# Patient Record
Sex: Female | Born: 1955 | Race: White | Hispanic: Yes | Marital: Married | Smoking: Never smoker
Health system: Southern US, Community
[De-identification: ages and names within clinical notes are randomized; demographics above are authoritative.]

## PROBLEM LIST (undated history)

## (undated) DIAGNOSIS — K219 Gastro-esophageal reflux disease without esophagitis: Secondary | ICD-10-CM

## (undated) DIAGNOSIS — E7800 Pure hypercholesterolemia, unspecified: Secondary | ICD-10-CM

## (undated) DIAGNOSIS — A159 Respiratory tuberculosis unspecified: Secondary | ICD-10-CM

## (undated) DIAGNOSIS — I255 Ischemic cardiomyopathy: Secondary | ICD-10-CM

## (undated) DIAGNOSIS — N390 Urinary tract infection, site not specified: Secondary | ICD-10-CM

## (undated) DIAGNOSIS — I5042 Chronic combined systolic (congestive) and diastolic (congestive) heart failure: Secondary | ICD-10-CM

## (undated) DIAGNOSIS — Z8489 Family history of other specified conditions: Secondary | ICD-10-CM

## (undated) DIAGNOSIS — I5189 Other ill-defined heart diseases: Secondary | ICD-10-CM

## (undated) DIAGNOSIS — I219 Acute myocardial infarction, unspecified: Secondary | ICD-10-CM

## (undated) DIAGNOSIS — J45909 Unspecified asthma, uncomplicated: Secondary | ICD-10-CM

## (undated) DIAGNOSIS — J302 Other seasonal allergic rhinitis: Secondary | ICD-10-CM

## (undated) DIAGNOSIS — F419 Anxiety disorder, unspecified: Secondary | ICD-10-CM

## (undated) DIAGNOSIS — E119 Type 2 diabetes mellitus without complications: Secondary | ICD-10-CM

## (undated) DIAGNOSIS — I2510 Atherosclerotic heart disease of native coronary artery without angina pectoris: Secondary | ICD-10-CM

## (undated) HISTORY — DX: Ischemic cardiomyopathy: I25.5

## (undated) HISTORY — PX: CARDIAC CATHETERIZATION: SHX172

## (undated) HISTORY — DX: Chronic combined systolic (congestive) and diastolic (congestive) heart failure: I50.42

## (undated) HISTORY — PX: GANGLION CYST EXCISION: SHX1691

## (undated) HISTORY — PX: TOOTH EXTRACTION: SUR596

## (undated) HISTORY — DX: Urinary tract infection, site not specified: N39.0

## (undated) HISTORY — DX: Atherosclerotic heart disease of native coronary artery without angina pectoris: I25.10

## (undated) HISTORY — DX: Unspecified asthma, uncomplicated: J45.909

---

## 2004-10-09 HISTORY — PX: TOTAL ABDOMINAL HYSTERECTOMY: SHX209

## 2005-06-29 ENCOUNTER — Emergency Department: Payer: Self-pay | Admitting: Emergency Medicine

## 2005-07-04 ENCOUNTER — Ambulatory Visit: Payer: Self-pay | Admitting: Emergency Medicine

## 2006-03-19 ENCOUNTER — Ambulatory Visit: Payer: Self-pay | Admitting: Internal Medicine

## 2006-03-30 ENCOUNTER — Ambulatory Visit: Payer: Self-pay | Admitting: Obstetrics and Gynecology

## 2007-11-20 ENCOUNTER — Emergency Department: Payer: Self-pay | Admitting: Internal Medicine

## 2008-11-16 ENCOUNTER — Ambulatory Visit: Payer: Self-pay | Admitting: Unknown Physician Specialty

## 2012-06-09 LAB — HM DIABETES EYE EXAM

## 2013-06-16 ENCOUNTER — Encounter: Payer: Self-pay | Admitting: Adult Health

## 2013-06-16 ENCOUNTER — Ambulatory Visit (INDEPENDENT_AMBULATORY_CARE_PROVIDER_SITE_OTHER): Payer: Managed Care, Other (non HMO) | Admitting: Adult Health

## 2013-06-16 VITALS — BP 160/78 | HR 84 | Temp 98.2°F | Resp 12 | Ht 61.75 in | Wt 175.0 lb

## 2013-06-16 DIAGNOSIS — Z0000 Encounter for general adult medical examination without abnormal findings: Secondary | ICD-10-CM

## 2013-06-16 DIAGNOSIS — Z1211 Encounter for screening for malignant neoplasm of colon: Secondary | ICD-10-CM | POA: Insufficient documentation

## 2013-06-16 DIAGNOSIS — E1169 Type 2 diabetes mellitus with other specified complication: Secondary | ICD-10-CM | POA: Insufficient documentation

## 2013-06-16 DIAGNOSIS — E119 Type 2 diabetes mellitus without complications: Secondary | ICD-10-CM

## 2013-06-16 DIAGNOSIS — Z1239 Encounter for other screening for malignant neoplasm of breast: Secondary | ICD-10-CM | POA: Insufficient documentation

## 2013-06-16 DIAGNOSIS — E1159 Type 2 diabetes mellitus with other circulatory complications: Secondary | ICD-10-CM | POA: Insufficient documentation

## 2013-06-16 LAB — HM DIABETES FOOT EXAM: HM Diabetic Foot Exam: NORMAL

## 2013-06-16 MED ORDER — METFORMIN HCL 1000 MG PO TABS: 1000.0000 mg | ORAL_TABLET | Freq: Every day | ORAL | Status: DC

## 2013-06-16 NOTE — Assessment & Plan Note (Signed)
Normal physical exam. check labs: CBC with differential, TSH, basic metabolic panel, hepatic panel, vitamin D level, B12 and hemoglobin A1c

## 2013-06-16 NOTE — Patient Instructions (Addendum)
   Thank you for choosing [place] at [facility] for your health care needs.  Please return for your fasting blood work. Nothing to eat or drink after midnight. You may drink water.  The results will be available through MyChart for your convenience. Please remember to activate this. The activation code is located at the end of this form.

## 2013-06-16 NOTE — Assessment & Plan Note (Signed)
Currently on metformin 1000 mg daily. Patient reports that she ran out of medication approximately one month ago. Medication reordered. Check hemoglobin A1c.

## 2013-06-16 NOTE — Assessment & Plan Note (Signed)
Patient has not had screening colonoscopy. Refer to GI for same

## 2013-06-16 NOTE — Progress Notes (Signed)
  Subjective:    Patient ID: Olivia Roth, female    DOB: September 03, 1956, 57 y.o.   MRN: [id number]  HPI  Olivia Roth presents to clinic to establish care. She was previously followed for her DM by Dr. Silver Huguenin who has since retired. Otherwise she would go to Urgent Care in the event of acute illness.  She is feeling good today. No current concerns.    Past Medical History  Diagnosis Date  . Asthma     In cold weather  . Diabetes mellitus without complication   . Allergy     Seasonal allergies  . UTI (lower urinary tract infection)      Past Surgical History  Procedure Laterality Date  . Salpingooophrectomy Bilateral 2006  . Abdominal hysterectomy  2006     History reviewed. No pertinent family history.   History   Social History  . Marital Status: Married    Spouse Name: N/A    Number of Children: 2  . Years of Education: 18   Occupational History  . Principal     [employer]   Social History Main Topics  . Smoking status: Never Smoker   . Smokeless tobacco: Never Used  . Alcohol Use: Yes     Comment: occasional use  . Drug Use: No  . Sexual Activity: Yes    Birth Control/ Protection: Surgical   Other Topics Concern  . Not on file   Social History Narrative  . No narrative on file     Review of Systems  Constitutional: Negative.   HENT: Negative.   Eyes: Negative.   Respiratory: Negative.   Cardiovascular: Negative.   Gastrointestinal: Negative.   Endocrine: Negative.   Genitourinary: Negative.   Musculoskeletal: Negative.        Occasional joint discomfort attributed to normal wear and tear.  Skin: Negative.   Allergic/Immunologic:       Seasonal allergies - mild  Neurological: Negative.   Hematological: Negative.   Psychiatric/Behavioral: Negative.    BP 160/78  Pulse 84  Temp(Src) 98.2 F (36.8 C) (Oral)  Resp 12  Ht 5' 1.75" (1.568 m)  Wt 175 lb (79.379 kg)  BMI 32.29 kg/m2  SpO2 97%    Objective:   Physical Exam   Constitutional: She is oriented to person, place, and time. She appears well-developed and well-nourished.  HENT:  Head: Normocephalic and atraumatic.  Right Ear: External ear normal.  Left Ear: External ear normal.  Nose: Nose normal.  Mouth/Throat: Oropharynx is clear and moist.  Eyes: Conjunctivae and EOM are normal. Pupils are equal, round, and reactive to light.  Neck: Normal range of motion. Neck supple.  Cardiovascular: Normal rate, regular rhythm, normal heart sounds and intact distal pulses.  Exam reveals no gallop and no friction rub.   No murmur heard. Pulmonary/Chest: Effort normal and breath sounds normal. No respiratory distress. She has no wheezes. She has no rales. She exhibits no tenderness.  Abdominal: Soft. Bowel sounds are normal. She exhibits no distension and no mass. There is no tenderness. There is no rebound and no guarding.  Musculoskeletal: Normal range of motion. She exhibits no edema and no tenderness.  Neurological: She is alert and oriented to person, place, and time.  Skin: Skin is warm and dry.  Psychiatric: She has a normal mood and affect. Her behavior is normal. Judgment and thought content normal.      Assessment & Plan:

## 2013-06-16 NOTE — Assessment & Plan Note (Signed)
Mammogram ordered

## 2013-06-23 ENCOUNTER — Other Ambulatory Visit: Payer: Managed Care, Other (non HMO)

## 2015-10-06 ENCOUNTER — Ambulatory Visit (INDEPENDENT_AMBULATORY_CARE_PROVIDER_SITE_OTHER): Payer: 59 | Admitting: Family Medicine

## 2015-10-06 ENCOUNTER — Encounter: Payer: Self-pay | Admitting: Family Medicine

## 2015-10-06 VITALS — BP 144/92 | HR 112 | Temp 99.7°F | Resp 16 | Ht 61.0 in | Wt 162.0 lb

## 2015-10-06 DIAGNOSIS — R059 Cough, unspecified: Secondary | ICD-10-CM

## 2015-10-06 DIAGNOSIS — H66001 Acute suppurative otitis media without spontaneous rupture of ear drum, right ear: Secondary | ICD-10-CM

## 2015-10-06 DIAGNOSIS — R030 Elevated blood-pressure reading, without diagnosis of hypertension: Secondary | ICD-10-CM

## 2015-10-06 DIAGNOSIS — R05 Cough: Secondary | ICD-10-CM | POA: Diagnosis not present

## 2015-10-06 DIAGNOSIS — Z20828 Contact with and (suspected) exposure to other viral communicable diseases: Secondary | ICD-10-CM | POA: Diagnosis not present

## 2015-10-06 DIAGNOSIS — R509 Fever, unspecified: Secondary | ICD-10-CM

## 2015-10-06 DIAGNOSIS — E119 Type 2 diabetes mellitus without complications: Secondary | ICD-10-CM | POA: Diagnosis not present

## 2015-10-06 DIAGNOSIS — IMO0001 Reserved for inherently not codable concepts without codable children: Secondary | ICD-10-CM

## 2015-10-06 LAB — POCT INFLUENZA A/B
Influenza A, POC: NEGATIVE
Influenza B, POC: NEGATIVE

## 2015-10-06 MED ORDER — DM-GUAIFENESIN ER 30-600 MG PO TB12: 1.0000 | ORAL_TABLET | Freq: Two times a day (BID) | ORAL | Status: DC

## 2015-10-06 MED ORDER — OSELTAMIVIR PHOSPHATE 75 MG PO CAPS: 75.0000 mg | ORAL_CAPSULE | Freq: Two times a day (BID) | ORAL | Status: DC

## 2015-10-06 MED ORDER — BENZONATATE 100 MG PO CAPS: 100.0000 mg | ORAL_CAPSULE | Freq: Three times a day (TID) | ORAL | Status: DC | PRN

## 2015-10-06 MED ORDER — OSELTAMIVIR PHOSPHATE 75 MG PO CAPS: 75.0000 mg | ORAL_CAPSULE | Freq: Two times a day (BID) | ORAL | Status: AC

## 2015-10-06 MED ORDER — AMOXICILLIN 500 MG PO CAPS: 500.0000 mg | ORAL_CAPSULE | Freq: Two times a day (BID) | ORAL | Status: DC

## 2015-10-06 NOTE — Progress Notes (Signed)
Subjective:    Patient ID: Olivia Roth, female    DOB: 08/03/1956, 59 y.o.   MRN: [id number]  HPI: Olivia Roth is a 59 y.o. female presenting on 10/06/2015 for Cough   HPI  Pt presents as new patient for a sick visit. She will follow-up to establish care at a later date. Records from previous provider will be requested and reviewed.   Symptoms began yesterday with cough and fever.  Took tylenol for fever. Non-productive cough. Chest tightness. No wheezing. No nasal congestion. Runny nose- clear nasal drainage. Body aches. Husband is positive for the flu. Denies Chest pain or shortness of breath.   History is significant for diabetes. Pt reports she is not taking metformin at this time.   Past Medical History  Diagnosis Date  . Asthma     In cold weather  . Diabetes mellitus without complication   . Allergy     Seasonal allergies  . UTI (lower urinary tract infection)    Social History   Social History  . Marital Status: Married    Spouse Name: N/A  . Number of Children: 2  . Years of Education: 18   Occupational History  . Principal     [employer]   Social History Main Topics  . Smoking status: Never Smoker   . Smokeless tobacco: Never Used  . Alcohol Use: Yes     Comment: occasional use  . Drug Use: No  . Sexual Activity: Yes    Birth Control/ Protection: Surgical   Other Topics Concern  . Not on file   Social History Narrative  . No narrative on file   No family history on file. No current outpatient prescriptions on file prior to visit.   No current facility-administered medications on file prior to visit.    Review of Systems  Constitutional: Positive for fever and chills.  HENT: Positive for rhinorrhea and sore throat. Negative for ear pain.   Eyes: Negative for visual disturbance.  Respiratory: Positive for cough and chest tightness. Negative for shortness of breath and wheezing.   Cardiovascular: Negative for chest pain, palpitations  and leg swelling.  Gastrointestinal: Negative for nausea, vomiting, abdominal pain, diarrhea and constipation.  Endocrine: Negative.  Negative for cold intolerance, heat intolerance, polydipsia, polyphagia and polyuria.  Genitourinary: Negative for dysuria and difficulty urinating.  Musculoskeletal: Positive for myalgias. Negative for neck pain and neck stiffness.  Neurological: Positive for headaches. Negative for dizziness, light-headedness and numbness.  Psychiatric/Behavioral: Negative.    Per HPI unless specifically indicated above     Objective:    BP 144/92 mmHg  Pulse 112  Temp(Src) 99.7 F (37.6 C) (Oral)  Resp 16  Ht  (1.549 m)  Wt 162 lb (73.483 kg)  BMI 30.63 kg/m2  SpO2 94%  Wt Readings from Last 3 Encounters:  10/06/15 162 lb (73.483 kg)  06/16/13 175 lb (79.379 kg)    Physical Exam  Constitutional: She is oriented to person, place, and time. She appears well-developed and well-nourished.  HENT:  Head: Normocephalic and atraumatic.  Right Ear: Hearing normal. Tympanic membrane is erythematous and bulging.  Left Ear: Hearing normal. Tympanic membrane is not erythematous and not bulging.  Nose: Mucosal edema and rhinorrhea present. Right sinus exhibits no maxillary sinus tenderness and no frontal sinus tenderness. Left sinus exhibits no maxillary sinus tenderness and no frontal sinus tenderness.  Mouth/Throat: Uvula is midline and mucous membranes are normal. Posterior oropharyngeal erythema present.  Neck: Neck supple.  No Brudzinski's sign and no Kernig's sign noted.  Cardiovascular: Regular rhythm, normal heart sounds and normal pulses.  Tachycardia present.  Exam reveals no gallop and no friction rub.   No murmur heard. Pulmonary/Chest: Effort normal and breath sounds normal. No respiratory distress. She has no decreased breath sounds. She has no wheezes. She has no rhonchi. She has no rales. Chest wall is not dull to percussion. She exhibits no tenderness.    Abdominal: Soft. Normal appearance and bowel sounds are normal. She exhibits no distension and no mass. There is no tenderness. There is no rebound and no guarding.  Musculoskeletal: Normal range of motion. She exhibits no edema or tenderness.  Lymphadenopathy:    She has no cervical adenopathy.  Neurological: She is alert and oriented to person, place, and time.  Skin: Skin is warm and dry.   Results for orders placed or performed in visit on 10/06/15  POCT Influenza A/B  Result Value Ref Range   Influenza A, POC Negative Negative   Influenza B, POC Negative Negative      Assessment & Plan:   Problem List Items Addressed This Visit      Endocrine   Diabetes mellitus type 2, controlled, without complications (HCC)    Pt encouraged to monitor blood sugars closely while ill. Blood glucose may be increased during her illness.  Diabetes follow-up in 2 weeks.        Other Visit Diagnoses    Exposure to the flu    -  Primary    Husband positive and her symptoms are the same. Treat with tamiflu. Supportive care reviewed. Alarm symptoms reviewed. Return if not improving.     Relevant Medications    oseltamivir (TAMIFLU) 75 MG capsule    Other Relevant Orders    POCT Influenza A/B (Completed)    Fever, unspecified fever cause        Likely 2/2 viral illness, possibily the flu.     Relevant Medications    oseltamivir (TAMIFLU) 75 MG capsule    Other Relevant Orders    POCT Influenza A/B (Completed)    Acute suppurative otitis media of right ear without spontaneous rupture of tympanic membrane, recurrence not specified        Treat with amoxicillin BID for 7 days to clear ear infection.     Relevant Medications    amoxicillin (AMOXIL) 500 MG capsule    oseltamivir (TAMIFLU) 75 MG capsule    Cough        Likely 2/2 the flu. Supportive care at home. Tessalon perles and mucinex DM.     Relevant Medications    benzonatate (TESSALON) 100 MG capsule    dextromethorphan-guaiFENesin  (MUCINEX DM) 30-600 MG 12hr tablet    Elevated BP        With tachycardia. Likley due to illness. Pt will monitor closely at home. Alarm symptoms reviewed. Recheck 2 weeks.        Meds ordered this encounter  Medications  . DISCONTD: benzonatate (TESSALON) 100 MG capsule    Sig: Take 1 capsule (100 mg total) by mouth 3 (three) times daily as needed for cough.    Dispense:  30 capsule    Refill:  0    Order Specific Question:  Supervising Provider    Answer:  Janeann Forehand [[id number]]  . DISCONTD: dextromethorphan-guaiFENesin (MUCINEX DM) 30-600 MG 12hr tablet    Sig: Take 1 tablet by mouth 2 (two) times daily.    Dispense:  20  tablet    Refill:  0    Order Specific Question:  Supervising Provider    Answer:  Janeann ForehandHAWKINS JR, JAMES H [[id number]]  . DISCONTD: oseltamivir (TAMIFLU) 75 MG capsule    Sig: Take 1 capsule (75 mg total) by mouth 2 (two) times daily.    Dispense:  10 capsule    Refill:  0    Order Specific Question:  Supervising Provider    Answer:  Janeann ForehandHAWKINS JR, JAMES H [phone][[id number]]  . DISCONTD: amoxicillin (AMOXIL) 500 MG capsule    Sig: Take 1 capsule (500 mg total) by mouth 2 (two) times daily.    Dispense:  14 capsule    Refill:  0    Order Specific Question:  Supervising Provider    Answer:  Janeann ForehandHAWKINS JR, JAMES H [[id number]]  . benzonatate (TESSALON) 100 MG capsule    Sig: Take 1 capsule (100 mg total) by mouth 3 (three) times daily as needed for cough.    Dispense:  30 capsule    Refill:  0    Order Specific Question:  Supervising Provider    Answer:  Janeann ForehandHAWKINS JR, JAMES H [[id number]]  . amoxicillin (AMOXIL) 500 MG capsule    Sig: Take 1 capsule (500 mg total) by mouth 2 (two) times daily.    Dispense:  14 capsule    Refill:  0    Order Specific Question:  Supervising Provider    Answer:  Janeann ForehandHAWKINS JR, JAMES H [[id number]]  . oseltamivir (TAMIFLU) 75 MG capsule    Sig: Take 1 capsule (75 mg total) by mouth 2 (two) times daily.    Dispense:  10 capsule    Refill:  0    Order  Specific Question:  Supervising Provider    Answer:  Janeann ForehandHAWKINS JR, JAMES H [phone][[id number]]  . dextromethorphan-guaiFENesin (MUCINEX DM) 30-600 MG 12hr tablet    Sig: Take 1 tablet by mouth 2 (two) times daily.    Dispense:  20 tablet    Refill:  0    Order Specific Question:  Supervising Provider    Answer:  Janeann ForehandHAWKINS JR, JAMES H [[id number]]      Follow up plan: Return in about 2 weeks (around 10/20/2015) for BP recheck..Marland Kitchen

## 2015-10-06 NOTE — Assessment & Plan Note (Signed)
Pt encouraged to monitor blood sugars closely while ill. Blood glucose may be increased during her illness.  Diabetes follow-up in 2 weeks.

## 2015-10-06 NOTE — Patient Instructions (Signed)

## 2016-07-05 ENCOUNTER — Encounter: Payer: Self-pay | Admitting: Family Medicine

## 2016-07-05 ENCOUNTER — Ambulatory Visit (INDEPENDENT_AMBULATORY_CARE_PROVIDER_SITE_OTHER): Payer: 59 | Admitting: Family Medicine

## 2016-07-05 VITALS — BP 136/72 | HR 98 | Temp 98.6°F | Resp 16 | Ht 61.0 in | Wt 164.0 lb

## 2016-07-05 DIAGNOSIS — R000 Tachycardia, unspecified: Secondary | ICD-10-CM

## 2016-07-05 DIAGNOSIS — R05 Cough: Secondary | ICD-10-CM

## 2016-07-05 DIAGNOSIS — J40 Bronchitis, not specified as acute or chronic: Secondary | ICD-10-CM | POA: Diagnosis not present

## 2016-07-05 DIAGNOSIS — E119 Type 2 diabetes mellitus without complications: Secondary | ICD-10-CM | POA: Diagnosis not present

## 2016-07-05 DIAGNOSIS — R059 Cough, unspecified: Secondary | ICD-10-CM

## 2016-07-05 DIAGNOSIS — J45909 Unspecified asthma, uncomplicated: Secondary | ICD-10-CM | POA: Insufficient documentation

## 2016-07-05 DIAGNOSIS — J4521 Mild intermittent asthma with (acute) exacerbation: Secondary | ICD-10-CM | POA: Diagnosis not present

## 2016-07-05 LAB — COMPLETE METABOLIC PANEL WITH GFR
ALT: 13 U/L (ref 6–29)
AST: 14 U/L (ref 10–35)
Albumin: 4.1 g/dL (ref 3.6–5.1)
Alkaline Phosphatase: 118 U/L (ref 33–130)
BUN: 13 mg/dL (ref 7–25)
CO2: 24 mmol/L (ref 20–31)
Calcium: 9.2 mg/dL (ref 8.6–10.4)
Chloride: 99 mmol/L (ref 98–110)
Creat: 0.81 mg/dL (ref 0.50–0.99)
GFR, Est African American: >89 mL/min (ref >=60)
GFR, Est Non African American: 79 mL/min (ref >=60)
Glucose, Bld: 361 mg/dL — ABNORMAL HIGH (ref 65–99)
Potassium: 3.9 mmol/L (ref 3.5–5.3)
Sodium: 137 mmol/L (ref 135–146)
Total Bilirubin: 0.9 mg/dL (ref 0.2–1.2)
Total Protein: 7 g/dL (ref 6.1–8.1)

## 2016-07-05 LAB — CBC WITH DIFFERENTIAL/PLATELET
Basophils Absolute: 0 cells/uL (ref 0–200)
Basophils Relative: 0 %
Eosinophils Absolute: 154 cells/uL (ref 15–500)
Eosinophils Relative: 2 %
HCT: 44.2 % (ref 35.0–45.0)
Hemoglobin: 15 g/dL (ref 11.7–15.5)
Lymphocytes Relative: 19 %
Lymphs Abs: 1463 cells/uL (ref 850–3900)
MCH: 30.6 pg (ref 27.0–33.0)
MCHC: 33.9 g/dL (ref 32.0–36.0)
MCV: 90.2 fL (ref 80.0–100.0)
MPV: 11 fL (ref 7.5–12.5)
Monocytes Absolute: 385 cells/uL (ref 200–950)
Monocytes Relative: 5 %
Neutro Abs: 5698 cells/uL (ref 1500–7800)
Neutrophils Relative %: 74 %
Platelets: 187 10*3/uL (ref 140–400)
RBC: 4.9 MIL/uL (ref 3.80–5.10)
RDW: 13.4 % (ref 11.0–15.0)
WBC: 7.7 10*3/uL (ref 3.8–10.8)

## 2016-07-05 LAB — LIPID PANEL
Cholesterol: 237 mg/dL — ABNORMAL HIGH (ref 125–200)
HDL: 34 mg/dL — ABNORMAL LOW (ref >=46)
Total CHOL/HDL Ratio: 7 Ratio — ABNORMAL HIGH (ref <=5.0)
Triglycerides: 405 mg/dL — ABNORMAL HIGH (ref <150)

## 2016-07-05 MED ORDER — BENZONATATE 100 MG PO CAPS: 100.0000 mg | ORAL_CAPSULE | Freq: Three times a day (TID) | ORAL | 0 refills | Status: DC | PRN

## 2016-07-05 MED ORDER — ALBUTEROL SULFATE HFA 108 (90 BASE) MCG/ACT IN AERS: 2.0000 | INHALATION_SPRAY | Freq: Four times a day (QID) | RESPIRATORY_TRACT | 11 refills | Status: DC | PRN

## 2016-07-05 MED ORDER — AZITHROMYCIN 250 MG PO TABS: ORAL_TABLET | ORAL | 0 refills | Status: DC

## 2016-07-05 MED ORDER — PREDNISONE 20 MG PO TABS: 40.0000 mg | ORAL_TABLET | Freq: Every day | ORAL | 0 refills | Status: DC

## 2016-07-05 MED ORDER — ALBUTEROL SULFATE (2.5 MG/3ML) 0.083% IN NEBU
2.5000 mg | INHALATION_SOLUTION | Freq: Once | RESPIRATORY_TRACT | Status: AC
  Administered 2016-07-05: 2.5 mg via RESPIRATORY_TRACT

## 2016-07-05 NOTE — Assessment & Plan Note (Signed)
Check A1c today. Pt encouraged to make follow-up for diabetes at her visit today. Has not been addressed in some time.

## 2016-07-05 NOTE — Assessment & Plan Note (Signed)
Mild HR 102-104 despite albuterol. Pt encouraged to monitor her HR at home if HR >120 or symptoms seek medical attention. Consider metoprolol if HR remains elevated.

## 2016-07-05 NOTE — Patient Instructions (Addendum)
We are treating you for an asthma exacerbation today. Take prednisone 40mg  (2 tablets) once daily with breakfast for 5 days. This will help your breathing. Use albulterol inhaler as needed for shortness of breath or chest tightness.  We will also cover for a lung infection with a Zpak- take 2 pills today and 1 pill daily until the bottle is empty.   Please seek immediate medical attention in the ER if you develop shortness of breath not relieve by inhaler, chest pain/tightness, fever > 103 F or other concerning symptoms.   Please keep an eye on your heart rate with the albuterol inhaler. It did well in the office with our trial of albuterol. If your heart rate >120 or you have palpitations- please seek medical attention.

## 2016-07-05 NOTE — Assessment & Plan Note (Signed)
Treat for asthma exacerbation today. Discussed possible CXR to r/o pneumonia. Pt declined. Prednisone is tolerated despite cortisone allergy. Treat with steroid burst. Albuterol PRN. Supportive care at home. Alarm symptoms reviewed.

## 2016-07-05 NOTE — Progress Notes (Signed)
Subjective:    Patient ID: Olivia MoselleMaria D Mccauslin, female    DOB: Jan 03, 1956, 60 y.o.   MRN: [id number]  HPI: Olivia Roth is a 60 y.o. female presenting on 07/05/2016 for Sinusitis (itchy throat last sinus drainage onset last week) and Cough (onset yesterday coughing hard feeling SOB )   HPI  Pt presents for cough and congestion. Symptoms started 1 weeks ago. Felt better and then it began to worsen yester. She does have asthma- no inhaler at home because it expired. She did not request a refill. Feels like her asthma is exacerbated. Coughing and chest tightness. More shortness of breath. Cough is not productive of sputum.   Past Medical History:  Diagnosis Date  . Allergy    Seasonal allergies  . Asthma    In cold weather  . Diabetes mellitus without complication (HCC)   . UTI (lower urinary tract infection)     No current outpatient prescriptions on file prior to visit.   No current facility-administered medications on file prior to visit.     Review of Systems  Constitutional: Negative for fever.  HENT: Positive for congestion. Negative for rhinorrhea and sore throat.   Respiratory: Positive for cough, chest tightness and wheezing.   Cardiovascular: Negative for chest pain, palpitations and leg swelling.  Gastrointestinal: Negative for diarrhea, nausea and vomiting.  Musculoskeletal: Negative for neck pain and neck stiffness.  Skin: Negative for color change and rash.  Neurological: Negative for headaches.   Per HPI unless specifically indicated above     Objective:    BP 136/72 (BP Location: Left Arm)   Pulse 98 Comment: while administering albuterol  Temp 98.6 F (37 C) (Oral)   Resp 16   Ht 5\' 1"  (1.549 m)   Wt 164 lb (74.4 kg)   SpO2 96%   BMI 30.99 kg/m   Wt Readings from Last 3 Encounters:  07/05/16 164 lb (74.4 kg)  10/06/15 162 lb (73.5 kg)  06/16/13 175 lb (79.4 kg)    Physical Exam  Constitutional: She is oriented to person, place, and time. She appears  well-developed and well-nourished. She appears ill. No distress.  HENT:  Head: Normocephalic and atraumatic.  Right Ear: Hearing and tympanic membrane normal.  Left Ear: Hearing and tympanic membrane normal.  Nose: Mucosal edema and rhinorrhea present. Right sinus exhibits no maxillary sinus tenderness and no frontal sinus tenderness. Left sinus exhibits no maxillary sinus tenderness and no frontal sinus tenderness.  Mouth/Throat: Uvula is midline and mucous membranes are normal. No posterior oropharyngeal erythema.  Neck: Normal range of motion. No erythema present. No Brudzinski's sign and no Kernig's sign noted.  Cardiovascular: Normal rate and regular rhythm.  Exam reveals no gallop and no friction rub.   No murmur heard. Pulmonary/Chest: Effort normal. No respiratory distress. She has no decreased breath sounds. She has wheezes in the right middle field, the right lower field, the left middle field and the left lower field. She has no rhonchi. She has no rales. Chest wall is not dull to percussion. She exhibits no tenderness.  Lymphadenopathy:    She has cervical adenopathy.       Right cervical: Superficial cervical adenopathy present.       Left cervical: Superficial cervical adenopathy present.  Neurological: She is alert and oriented to person, place, and time.  Skin: Skin is warm and dry. No rash noted. She is not diaphoretic. No erythema. No pallor.  Psychiatric: She has a normal mood and affect. Her  speech is normal and behavior is normal.   Results for orders placed or performed in visit on 10/06/15  POCT Influenza A/B  Result Value Ref Range   Influenza A, POC Negative Negative   Influenza B, POC Negative Negative      Assessment & Plan:   Problem List Items Addressed This Visit      Respiratory   Asthma - Primary    Treat for asthma exacerbation today. Discussed possible CXR to r/o pneumonia. Pt declined. Prednisone is tolerated despite cortisone allergy. Treat with  steroid burst. Albuterol PRN. Supportive care at home. Alarm symptoms reviewed.       Relevant Medications   albuterol (PROVENTIL HFA;VENTOLIN HFA) 108 (90 Base) MCG/ACT inhaler   predniSONE (DELTASONE) 20 MG tablet   albuterol (PROVENTIL) (2.5 MG/3ML) 0.083% nebulizer solution 2.5 mg (Completed)   Other Relevant Orders   CBC with Differential     Endocrine   Diabetes mellitus type 2, controlled, without complications (HCC)    Check A1c today. Pt encouraged to make follow-up for diabetes at her visit today. Has not been addressed in some time.       Relevant Orders   COMPLETE METABOLIC PANEL WITH GFR   Hemoglobin A1c   Lipid Profile     Other   Tachycardia    Mild HR 102-104 despite albuterol. Pt encouraged to monitor her HR at home if HR >120 or symptoms seek medical attention. Consider metoprolol if HR remains elevated.        Other Visit Diagnoses    Bronchitis       Cover with Zpak given second worsening.    Relevant Medications   azithromycin (ZITHROMAX) 250 MG tablet   benzonatate (TESSALON) 100 MG capsule   Cough       Likely 2/2 the flu. Supportive care at home. Tessalon perles and mucinex DM.       Meds ordered this encounter  Medications  . albuterol (PROVENTIL HFA;VENTOLIN HFA) 108 (90 Base) MCG/ACT inhaler    Sig: Inhale 2 puffs into the lungs every 6 (six) hours as needed for wheezing or shortness of breath.    Dispense:  1 Inhaler    Refill:  11    Order Specific Question:   Supervising Provider    Answer:   Janeann Forehand [phone]  . predniSONE (DELTASONE) 20 MG tablet    Sig: Take 2 tablets (40 mg total) by mouth daily with breakfast.    Dispense:  10 tablet    Refill:  0    Order Specific Question:   Supervising Provider    Answer:   Janeann Forehand [phone]  . azithromycin (ZITHROMAX) 250 MG tablet    Sig: Take 2 pills today and 1 pill daily until the bottle is empty.    Dispense:  6 tablet    Refill:  0    Order Specific Question:    Supervising Provider    Answer:   Janeann Forehand [[id number]]  . albuterol (PROVENTIL) (2.5 MG/3ML) 0.083% nebulizer solution 2.5 mg  . benzonatate (TESSALON) 100 MG capsule    Sig: Take 1 capsule (100 mg total) by mouth 3 (three) times daily as needed for cough.    Dispense:  30 capsule    Refill:  0    Order Specific Question:   Supervising Provider    Answer:   Janeann Forehand [[id number]]      Follow up plan: Return in about 2 weeks (around  07/19/2016), or if symptoms worsen or fail to improve, for Diabetes, asthma.

## 2016-07-06 LAB — HEMOGLOBIN A1C
Hgb A1c MFr Bld: 13.1 % — ABNORMAL HIGH (ref <5.7)
Mean Plasma Glucose: 329 mg/dL

## 2016-07-12 ENCOUNTER — Other Ambulatory Visit: Payer: Self-pay | Admitting: Family Medicine

## 2016-07-12 DIAGNOSIS — E1165 Type 2 diabetes mellitus with hyperglycemia: Secondary | ICD-10-CM

## 2016-07-12 MED ORDER — METFORMIN HCL 1000 MG PO TABS: 1000.0000 mg | ORAL_TABLET | Freq: Two times a day (BID) | ORAL | 1 refills | Status: DC

## 2016-07-20 ENCOUNTER — Encounter: Payer: Self-pay | Admitting: Family Medicine

## 2016-07-20 ENCOUNTER — Ambulatory Visit (INDEPENDENT_AMBULATORY_CARE_PROVIDER_SITE_OTHER): Payer: 59 | Admitting: Family Medicine

## 2016-07-20 VITALS — BP 157/69 | HR 90 | Temp 98.5°F | Resp 16 | Ht 61.0 in | Wt 164.0 lb

## 2016-07-20 DIAGNOSIS — E1165 Type 2 diabetes mellitus with hyperglycemia: Secondary | ICD-10-CM

## 2016-07-20 DIAGNOSIS — J4520 Mild intermittent asthma, uncomplicated: Secondary | ICD-10-CM

## 2016-07-20 DIAGNOSIS — R030 Elevated blood-pressure reading, without diagnosis of hypertension: Secondary | ICD-10-CM

## 2016-07-20 DIAGNOSIS — B3731 Acute candidiasis of vulva and vagina: Secondary | ICD-10-CM

## 2016-07-20 DIAGNOSIS — E785 Hyperlipidemia, unspecified: Secondary | ICD-10-CM | POA: Diagnosis not present

## 2016-07-20 DIAGNOSIS — E1169 Type 2 diabetes mellitus with other specified complication: Secondary | ICD-10-CM | POA: Diagnosis not present

## 2016-07-20 DIAGNOSIS — I10 Essential (primary) hypertension: Secondary | ICD-10-CM | POA: Insufficient documentation

## 2016-07-20 DIAGNOSIS — B373 Candidiasis of vulva and vagina: Secondary | ICD-10-CM

## 2016-07-20 LAB — POCT UA - MICROALBUMIN

## 2016-07-20 MED ORDER — GLIMEPIRIDE 2 MG PO TABS: 2.0000 mg | ORAL_TABLET | Freq: Every day | ORAL | 3 refills | Status: DC

## 2016-07-20 MED ORDER — FLUCONAZOLE 150 MG PO TABS: 150.0000 mg | ORAL_TABLET | Freq: Once | ORAL | 0 refills | Status: AC

## 2016-07-20 MED ORDER — BLOOD GLUCOSE MONITOR KIT: PACK | 0 refills | Status: DC

## 2016-07-20 NOTE — Patient Instructions (Signed)
Please start checking your blood sugars 3 times per week in the morning before breakfast. Take Amaryl 2mg  in the morning with breakfast to help lower your blood sugars. Continue your metformin.  We need to see you back in 2 weeks to check your blood pressure. Please come fasting so we can recheck your lipid panel.   Please check your blood glucose 1 times daily. If your glucose is < 70 mg/dl or you have symptoms of hypoglycemia confusion, dizziness, headache, jitteriness and sweating please drink 4 oz of juice or soda.  Check blood glucose 15 minutes later. If it has not risen to >100, please seek medical attention. If > 100 please eat a snack containing protein such as peanut butter and crackers.

## 2016-07-20 NOTE — Assessment & Plan Note (Signed)
Pt has declined a statin at this time. Last Tg's were 405 and unable to get complete cholesterol panel. Pt will come fasting for 2 week follow-up for repeat of lipid panel. Reviewed the risks of heart disease associated with diabetes and how statin lower that risk. Pt stated she would think about statin therapy but does not want to make too many changes today.

## 2016-07-20 NOTE — Assessment & Plan Note (Signed)
Pt will return in 2 weeks for blood pressure check. Pt encouraged to check blood pressure at home. Elevation may be in part to anxiety about today's visit. Reviewed the DASH diet. Encouraged exercise. Alarm symptoms reviewed.

## 2016-07-20 NOTE — Assessment & Plan Note (Signed)
Uncontrolled diabetes. Pt is very anxious about treatment and only wants to take certain medication. Had a long conversation about the risks associated with diabetes and how uncontrolled diabetes increases those risks. Pt has consented to take Amaryl 2mg  once daily in addition to metformin. Will start checking blood sugars 3 times weekly. Declined ACE for renal protection.  Foot exam UTD Eye exam needed.

## 2016-07-20 NOTE — Assessment & Plan Note (Signed)
Recent exacerbation resolved. Encouraged pt to keep inhaler with her at all times.

## 2016-07-20 NOTE — Progress Notes (Signed)
Subjective:    Patient ID: Olivia Roth, female    DOB: 1955-10-12, 60 y.o.   MRN: [id number]  HPI: Olivia Roth is a 60 y.o. female presenting on 07/20/2016 for Diabetes   HPI  Pt presents for follow-up of diabetes. She was in the office 2 weeks ago for asthma exacerbation and found to have an A1c of 13.1% Breathing is doing much better since completing prednisone. No current asthma symptoms.  Restarted metformin last week. Tolerating well.  Pt is declining insulin, GLP-1, or invokana at this time. She only wants to take metformin. Does not check her sugar at home. Eye exam last year- needs to make appt for this year. Sees Dr. Wallace Going regularly. No numbness and tingling in her feet. No blurred vision. No polydipsia, polyphagia.  Pt has notice increased in vaginal yeast infections over the past year.  Declines flu shot at this time.   Past Medical History:  Diagnosis Date  . Allergy    Seasonal allergies  . Asthma    In cold weather  . Diabetes mellitus without complication ([place])   . UTI (lower urinary tract infection)     Current Outpatient Prescriptions on File Prior to Visit  Medication Sig  . albuterol (PROVENTIL HFA;VENTOLIN HFA) 108 (90 Base) MCG/ACT inhaler Inhale 2 puffs into the lungs every 6 (six) hours as needed for wheezing or shortness of breath.  Marland Kitchen azithromycin (ZITHROMAX) 250 MG tablet Take 2 pills today and 1 pill daily until the bottle is empty.  . benzonatate (TESSALON) 100 MG capsule Take 1 capsule (100 mg total) by mouth 3 (three) times daily as needed for cough.  . metFORMIN (GLUCOPHAGE) 1000 MG tablet Take 1 tablet (1,000 mg total) by mouth 2 (two) times daily with a meal.  . predniSONE (DELTASONE) 20 MG tablet Take 2 tablets (40 mg total) by mouth daily with breakfast.   No current facility-administered medications on file prior to visit.     Review of Systems  Constitutional: Negative for chills and fever.  HENT: Negative.   Respiratory: Negative  for cough, chest tightness and wheezing.   Cardiovascular: Negative for chest pain and leg swelling.  Gastrointestinal: Negative for abdominal pain, constipation, diarrhea, nausea and vomiting.  Endocrine: Negative.  Negative for cold intolerance, heat intolerance, polydipsia, polyphagia and polyuria.  Genitourinary: Negative for difficulty urinating and dysuria.  Musculoskeletal: Negative.   Neurological: Negative for dizziness, light-headedness and numbness.  Psychiatric/Behavioral: Negative.    Per HPI unless specifically indicated above     Objective:    BP (!) 157/69   Pulse 90   Temp 98.5 F (36.9 C) (Oral)   Resp 16   Ht _0  (1.549 m)   Wt 74.4 kg (164 lb)   BMI 30.99 kg/m   Wt Readings from Last 3 Encounters:  07/20/16 74.4 kg (164 lb)  07/05/16 74.4 kg (164 lb)  10/06/15 73.5 kg (162 lb)    Physical Exam  Constitutional: She is oriented to person, place, and time. She appears well-developed and well-nourished.  HENT:  Head: Normocephalic and atraumatic.  Neck: Neck supple.  Cardiovascular: Normal rate, regular rhythm and normal heart sounds.  Exam reveals no gallop and no friction rub.   No murmur heard. Pulmonary/Chest: Effort normal and breath sounds normal. She has no wheezes. She exhibits no tenderness.  Abdominal: Soft. Normal appearance and bowel sounds are normal. She exhibits no distension and no mass. There is no tenderness. There is no rebound and no guarding.  Musculoskeletal: Normal range of motion. She exhibits no edema or tenderness.  Lymphadenopathy:    She has no cervical adenopathy.  Neurological: She is alert and oriented to person, place, and time.  Skin: Skin is warm and dry.     Diabetic Foot Exam - Simple   Simple Foot Form Diabetic Foot exam was performed with the following findings:  Yes 07/20/2016  4:51 PM  Visual Inspection No deformities, no ulcerations, no other skin breakdown bilaterally:  Yes Sensation Testing Intact to touch  and monofilament testing bilaterally:  Yes Pulse Check Posterior Tibialis and Dorsalis pulse intact bilaterally:  Yes Comments     Results for orders placed or performed in visit on 07/20/16  POCT UA - Microalbumin  Result Value Ref Range   Microalbumin Ur, POC o mg/L   Creatinine, POC  mg/dL   Albumin/Creatinine Ratio, Urine, POC        Assessment & Plan:   Problem List Items Addressed This Visit      Respiratory   Asthma    Recent exacerbation resolved. Encouraged pt to keep inhaler with her at all times.         Endocrine   DM (diabetes mellitus), type 2, uncontrolled ([place]) - Primary    Uncontrolled diabetes. Pt is very anxious about treatment and only wants to take certain medication. Had a long conversation about the risks associated with diabetes and how uncontrolled diabetes increases those risks. Pt has consented to take Amaryl '2mg'$  once daily in addition to metformin. Will start checking blood sugars 3 times weekly. Declined ACE for renal protection.  Foot exam UTD Eye exam needed.       Relevant Medications   glimepiride (AMARYL) 2 MG tablet   blood glucose meter kit and supplies KIT   Other Relevant Orders   POCT UA - Microalbumin (Completed)   Hyperlipidemia associated with type 2 diabetes mellitus ([place])    Pt has declined a statin at this time. Last Tg's were 405 and unable to get complete cholesterol panel. Pt will come fasting for 2 week follow-up for repeat of lipid panel. Reviewed the risks of heart disease associated with diabetes and how statin lower that risk. Pt stated she would think about statin therapy but does not want to make too many changes today.       Relevant Medications   glimepiride (AMARYL) 2 MG tablet     Other   Elevated BP without diagnosis of hypertension    Pt will return in 2 weeks for blood pressure check. Pt encouraged to check blood pressure at home. Elevation may be in part to anxiety about today's visit. Reviewed the DASH diet.  Encouraged exercise. Alarm symptoms reviewed.        Other Visit Diagnoses    Vaginal candida       Treat empirically with diflucan 2/2 diabetes with high A1c.    Relevant Medications   fluconazole (DIFLUCAN) 150 MG tablet      Meds ordered this encounter  Medications  . glimepiride (AMARYL) 2 MG tablet    Sig: Take 1 tablet (2 mg total) by mouth daily before breakfast.    Dispense:  30 tablet    Refill:  3    Order Specific Question:   Supervising Provider    Answer:   Arlis Porta [phone]  . blood glucose meter kit and supplies KIT    Sig: Dispense based on patient and insurance preference. Use up to four times daily as  directed. For E11.65 Check blood glucose 3 times weekly on MWF    Dispense:  1 each    Refill:  0    Order Specific Question:   Supervising Provider    Answer:   Arlis Porta [[id number]]    Order Specific Question:   Number of strips    Answer:   100    Order Specific Question:   Number of lancets    Answer:   100  . fluconazole (DIFLUCAN) 150 MG tablet    Sig: Take 1 tablet (150 mg total) by mouth once.    Dispense:  1 tablet    Refill:  0    Order Specific Question:   Supervising Provider    Answer:   Arlis Porta [[id number]]      Follow up plan: Return in about 2 weeks (around 08/03/2016) for Blood pressure. Marland Kitchen

## 2016-08-03 ENCOUNTER — Ambulatory Visit: Payer: 59 | Admitting: Family Medicine

## 2016-10-24 ENCOUNTER — Other Ambulatory Visit: Payer: Self-pay | Admitting: Family Medicine

## 2016-10-24 DIAGNOSIS — E1165 Type 2 diabetes mellitus with hyperglycemia: Secondary | ICD-10-CM

## 2016-10-24 MED ORDER — METFORMIN HCL 1000 MG PO TABS: 1000.0000 mg | ORAL_TABLET | Freq: Two times a day (BID) | ORAL | 2 refills | Status: DC

## 2017-07-04 ENCOUNTER — Ambulatory Visit (INDEPENDENT_AMBULATORY_CARE_PROVIDER_SITE_OTHER): Payer: Managed Care, Other (non HMO) | Admitting: Nurse Practitioner

## 2017-07-04 ENCOUNTER — Encounter: Payer: Self-pay | Admitting: Nurse Practitioner

## 2017-07-04 VITALS — BP 174/63 | HR 88 | Temp 98.6°F | Ht 61.0 in | Wt 166.2 lb

## 2017-07-04 DIAGNOSIS — I10 Essential (primary) hypertension: Secondary | ICD-10-CM

## 2017-07-04 DIAGNOSIS — E785 Hyperlipidemia, unspecified: Secondary | ICD-10-CM | POA: Diagnosis not present

## 2017-07-04 DIAGNOSIS — E1169 Type 2 diabetes mellitus with other specified complication: Secondary | ICD-10-CM | POA: Diagnosis not present

## 2017-07-04 DIAGNOSIS — E1165 Type 2 diabetes mellitus with hyperglycemia: Secondary | ICD-10-CM

## 2017-07-04 LAB — POCT GLYCOSYLATED HEMOGLOBIN (HGB A1C): Hemoglobin A1C: 9.8

## 2017-07-04 MED ORDER — METFORMIN HCL 500 MG PO TABS: ORAL_TABLET | ORAL | 1 refills | Status: DC

## 2017-07-04 NOTE — Patient Instructions (Addendum)
Olivia Roth, Thank you for coming in to clinic today.  1. For your diabetes: - Work on improving your diet even more and include only low glycemic index carbohydrates. - Increase your physical activity until you are increasing your heart rate for 30 minutes on most days of the week. - CONTINUE metformin 1,000 mg in am and take 500 mg with your evening meal.  2. For your blood pressure: Goal: less than 130/80.  For now work on lifestyle: reducing salt, increasing vegetables, adding exercise.  Face weakness or drooping Arm weakness (or leg weakness) Speech changes (loss of speech, slurred, or sounds that are not words) Time: act quickly, Call 9-1-1.  Preserve brain tissue!  Please schedule a follow-up appointment with Wilhelmina Mcardle, AGNP. Return in about 3 months (around 10/03/2017) for diabetes.  If you have any other questions or concerns, please feel free to call the clinic or send a message through MyChart. You may also schedule an earlier appointment if necessary.  You will receive a survey after today's visit either digitally by e-mail or paper by [employer]. Your experiences and feedback matter to [place].  Please respond so we know how we are doing as we provide care for you.   Wilhelmina Mcardle, DNP, AGNP-BC Adult Gerontology Nurse Practitioner [facility], [facility]   Hypertension Hypertension, commonly called high blood pressure, is when the force of blood pumping through the arteries is too strong. The arteries are the blood vessels that carry blood from the heart throughout the body. Hypertension forces the heart to work harder to pump blood and may cause arteries to become narrow or stiff. Having untreated or uncontrolled hypertension can cause heart attacks, strokes, kidney disease, and other problems. A blood pressure reading consists of a higher number over a lower number. Ideally, your blood pressure should be below 120/80. The first ("top") number is called the systolic  pressure. It is a measure of the pressure in your arteries as your heart beats. The second ("bottom") number is called the diastolic pressure. It is a measure of the pressure in your arteries as the heart relaxes. What are the causes? The cause of this condition is not known. What increases the risk? Some risk factors for high blood pressure are under your control. Others are not. Factors you can change  Smoking.  Having type 2 diabetes mellitus, high cholesterol, or both.  Not getting enough exercise or physical activity.  Being overweight.  Having too much fat, sugar, calories, or salt (sodium) in your diet.  Drinking too much alcohol. Factors that are difficult or impossible to change  Having chronic kidney disease.  Having a family history of high blood pressure.  Age. Risk increases with age.  Race. You may be at higher risk if you are African-American.  Gender. Men are at higher risk than women before age 44. After age 53, women are at higher risk than men.  Having obstructive sleep apnea.  Stress. What are the signs or symptoms? Extremely high blood pressure (hypertensive crisis) may cause:  Headache.  Anxiety.  Shortness of breath.  Nosebleed.  Nausea and vomiting.  Severe chest pain.  Jerky movements you cannot control (seizures).  How is this diagnosed? This condition is diagnosed by measuring your blood pressure while you are seated, with your arm resting on a surface. The cuff of the blood pressure monitor will be placed directly against the skin of your upper arm at the level of your heart. It should be measured at  least twice using the same arm. Certain conditions can cause a difference in blood pressure between your right and left arms. Certain factors can cause blood pressure readings to be lower or higher than normal (elevated) for a short period of time:  When your blood pressure is higher when you are in a health care provider's office than  when you are at home, this is called white coat hypertension. Most people with this condition do not need medicines.  When your blood pressure is higher at home than when you are in a health care provider's office, this is called masked hypertension. Most people with this condition may need medicines to control blood pressure.  If you have a high blood pressure reading during one visit or you have normal blood pressure with other risk factors:  You may be asked to return on a different day to have your blood pressure checked again.  You may be asked to monitor your blood pressure at home for 1 week or longer.  If you are diagnosed with hypertension, you may have other blood or imaging tests to help your health care provider understand your overall risk for other conditions. How is this treated? This condition is treated by making healthy lifestyle changes, such as eating healthy foods, exercising more, and reducing your alcohol intake. Your health care provider may prescribe medicine if lifestyle changes are not enough to get your blood pressure under control, and if:  Your systolic blood pressure is above 130.  Your diastolic blood pressure is above 80.  Your personal target blood pressure may vary depending on your medical conditions, your age, and other factors. Follow these instructions at home:  DASH EATING PLAN: dietary approaches to stop hypertension Eating and drinking  Eat a diet that is high in fiber and potassium, and low in sodium, added sugar, and fat. An example eating plan is called the DASH (Dietary Approaches to Stop Hypertension) diet. To eat this way: ? Eat plenty of fresh fruits and vegetables. Try to fill half of your plate at each meal with fruits and vegetables. ? Eat whole grains, such as whole wheat pasta, brown rice, or whole grain bread. Fill about one quarter of your plate with whole grains. ? Eat or drink low-fat dairy products, such as skim milk or low-fat  yogurt. ? Avoid fatty cuts of meat, processed or cured meats, and poultry with skin. Fill about one quarter of your plate with lean proteins, such as fish, chicken without skin, beans, eggs, and tofu. ? Avoid premade and processed foods. These tend to be higher in sodium, added sugar, and fat.  Reduce your daily sodium intake. Most people with hypertension should eat less than 1,500 mg of sodium a day.  Limit alcohol intake to no more than 1 drink a day for nonpregnant women and 2 drinks a day for men. One drink equals 12 oz of beer, 5 oz of wine, or 1 oz of hard liquor. Lifestyle  Work with your health care provider to maintain a healthy body weight or to lose weight. Ask what an ideal weight is for you.  Get at least 30 minutes of exercise that causes your heart to beat faster (aerobic exercise) most days of the week. Activities may include walking, swimming, or biking.  Include exercise to strengthen your muscles (resistance exercise), such as pilates or lifting weights, as part of your weekly exercise routine. Try to do these types of exercises for 30 minutes at least 3 days a  week.  Do not use any products that contain nicotine or tobacco, such as cigarettes and e-cigarettes. If you need help quitting, ask your health care provider.  Monitor your blood pressure at home as told by your health care provider.  Keep all follow-up visits as told by your health care provider. This is important. Medicines  Take over-the-counter and prescription medicines only as told by your health care provider. Follow directions carefully. Blood pressure medicines must be taken as prescribed.  Do not skip doses of blood pressure medicine. Doing this puts you at risk for problems and can make the medicine less effective.  Ask your health care provider about side effects or reactions to medicines that you should watch for. Contact a health care provider if:  You think you are having a reaction to a  medicine you are taking.  You have headaches that keep coming back (recurring).  You feel dizzy.  You have swelling in your ankles.  You have trouble with your vision. Get help right away if:  You develop a severe headache or confusion.  You have unusual weakness or numbness.  You feel faint.  You have severe pain in your chest or abdomen.  You vomit repeatedly.  You have trouble breathing. Summary  Hypertension is when the force of blood pumping through your arteries is too strong. If this condition is not controlled, it may put you at risk for serious complications.  Your personal target blood pressure may vary depending on your medical conditions, your age, and other factors. For most people, a normal blood pressure is less than 120/80.  Hypertension is treated with lifestyle changes, medicines, or a combination of both. Lifestyle changes include weight loss, eating a healthy, low-sodium diet, exercising more, and limiting alcohol. This information is not intended to replace advice given to you by your health care provider. Make sure you discuss any questions you have with your health care provider. Document Released: 09/25/2005 Document Revised: 08/23/2016 Document Reviewed: 08/23/2016 Elsevier Interactive Patient Education  [employer].

## 2017-07-04 NOTE — Progress Notes (Signed)
Subjective:    Patient ID: Olivia Roth, female    DOB: 02-16-1956, 61 y.o.   MRN: [id number]  Olivia Roth is a 61 y.o. female presenting on 07/04/2017 for Diabetes (need refill of medications )   HPI Diabetes Pt w/ new diagnosis DMT2 07/05/16 w/ only one followup appointment to date and no re-evaluation of A1c after initially starting metformin.  Pt presents today for follow up of Type 2 diabetes mellitus. - Hx gestational dm - She is not checking CBG at home regularly. Random afternoon CBG - 170. - Current diabetic medications include: metformin 1000 mg once daily.  Had been taking twice daily, but "felt loopy, kinda funny" at night when taking.  Never took glimepiride.  Pt states in 1 week after taking metformin, she could feel difference.  Felt better, had less polydipsia, polyuria, yeast infection went away. - She is not currently symptomatic.  - She denies polydipsia, polyphagia, polyuria, headaches, diaphoresis, shakiness, chills, pain, numbness or tingling in extremities and changes in vision.   - Clinical course has been unchanged. - She  reports no regular exercise routine. But uses stairs when able.   - Her diet is moderate in salt, moderate in fat, and low in carbohydrates. (LImits sugars and overall carbohydrates to 60 g per day) - Weight trend: stable  PREVENTION: Eye exam current (within one year): yes - 6 months ago "no retinal damage" [name]  Foot exam current (within one year): no - due today Lipid/ASCVD risk reduction - on statin: none (declines) Kidney protection - on ace or arb: none  Recent Labs  07/04/17 0844  HGBA1C 9.8     Hypertension - She is not checking BP at home or outside of clinic.    - Current medications: none,   - She is not currently symptomatic. - Pt denies headache, lightheadedness, dizziness, changes in vision, chest tightness/pressure, palpitations, leg swelling, sudden loss of speech or loss of consciousness.   Social History    Substance Use Topics  . Smoking status: Never Smoker  . Smokeless tobacco: Never Used  . Alcohol use Yes     Comment: occasional use    Review of Systems Per HPI unless specifically indicated above     Objective:    BP (!) 174/63 (BP Location: Right Arm, Patient Position: Sitting, Cuff Size: Normal)   Pulse 88   Temp 98.6 F (37 C) (Oral)   Ht  (1.549 m)   Wt 166 lb 3.2 oz (75.4 kg)   BMI 31.40 kg/m   Wt Readings from Last 3 Encounters:  07/04/17 166 lb 3.2 oz (75.4 kg)  07/20/16 164 lb (74.4 kg)  07/05/16 164 lb (74.4 kg)    Physical Exam  General - obese, well-appearing, NAD HEENT - Normocephalic, atraumatic Neck - supple, non-tender, no LAD, no thyromegaly, no carotid bruit Heart - RRR, no murmurs heard Lungs - Clear throughout all lobes, no wheezing, crackles, or rhonchi. Normal work of breathing. Abdomen - soft, NTND, no masses, no hepatosplenomegaly, active bowel sounds Extremeties - non-tender, no edema, cap refill < 2 seconds, peripheral pulses intact +2 bilaterally Skin - warm, dry Neuro - awake, alert, oriented x3, normal gait Psych - Normal mood and affect, normal behavior     Results for orders placed or performed in visit on 07/04/17  Lipid panel  Result Value Ref Range   Cholesterol 247 (H) <200 mg/dL   HDL 40 (L) >40 mg/dL   Triglycerides 981 (H) <150 mg/dL  LDL Cholesterol (Calc)  mg/dL (calc)   Total CHOL/HDL Ratio 6.2 (H) <5.0 (calc)   Non-HDL Cholesterol (Calc) 207 (H) <130 mg/dL (calc)  Comprehensive metabolic panel  Result Value Ref Range   Glucose, Bld 228 (H) 65 - 99 mg/dL   BUN 19 7 - 25 mg/dL   Creat 0.98 1.19 - 1.47 mg/dL   BUN/Creatinine Ratio NOT APPLICABLE 6 - 22 (calc)   Sodium 138 135 - 146 mmol/L   Potassium 4.1 3.5 - 5.3 mmol/L   Chloride 101 98 - 110 mmol/L   CO2 25 20 - 32 mmol/L   Calcium 9.6 8.6 - 10.4 mg/dL   Total Protein 7.2 6.1 - 8.1 g/dL   Albumin 4.3 3.6 - 5.1 g/dL   Globulin 2.9 1.9 - 3.7 g/dL (calc)    AG Ratio 1.5 1.0 - 2.5 (calc)   Total Bilirubin 0.5 0.2 - 1.2 mg/dL   Alkaline phosphatase (APISO) 105 33 - 130 U/L   AST 21 10 - 35 U/L   ALT 17 6 - 29 U/L  TSH  Result Value Ref Range   TSH 2.64 0.40 - 4.50 mIU/L  POCT glycosylated hemoglobin (Hb A1C)  Result Value Ref Range   Hemoglobin A1C 9.8       Assessment & Plan:   Problem List Items Addressed This Visit      Cardiovascular and Mediastinum   Hypertension    Uncontrolled BP today w/ several elevated BP readings in clinic setting. BP goal < 130/80.  P not currently working on lifestyle modifications.  Taking medications tolerating well without side effects. No current complications.  Plan: 1. Discussed starting medications.  Pt refused despite explaining risk exists for CVA and hypertensive crisis/MI. 2. Obtain labs CMP, lipid, TSH 3. Encouraged heart healthy diet and increasing exercise to 30 minutes most days of the week. 4. Check BP 1-2 x per week at home, keep log, and bring to clinic at next appointment. 5. Follow up 3 months.          Endocrine   DM (diabetes mellitus), type 2, uncontrolled ([facility]) - Primary    Uncontrolled.  A1c is lower than last visit w/ metformin, but pt only taking 1,000 mg once daily.  Pt cites side effects in pm w/ night time dose.  Plan: 1. INCREASE to metformin 1,000 mg in am and 500 mg in pm. 2. Reviewed low glycemic diet, basic DM pathophys, exercise. 3. Increase physical activity to 30 minutes most days of the week. 4. Follow up 3 months w/ repeat A1c.      Relevant Medications   metFORMIN (GLUCOPHAGE) 500 MG tablet   Other Relevant Orders   POCT glycosylated hemoglobin (Hb A1C) (Completed)   Lipid panel (Completed)   Comprehensive metabolic panel (Completed)   Hyperlipidemia associated with type 2 diabetes mellitus ([facility])    Needs lipid panel today reassess lipid function.  Pt w/ dietary indiscretions and elevated triglycerides in past.  Plan: 1. Discussed starting statin  med.  Pt refuses at this time. 2. Discussed possibility of pancreatitis if triglycerides are significantly elevated. 3. Encouraged diet and exercise. 4. Follow up 3 months.      Relevant Medications   metFORMIN (GLUCOPHAGE) 500 MG tablet   Other Relevant Orders   Lipid panel (Completed)   TSH (Completed)      Meds ordered this encounter  Medications  . Cinnamon 500 MG TABS    Sig: Take by mouth.  . metFORMIN (GLUCOPHAGE) 500 MG tablet  Sig: Take 2 tablets (1,000 mg total) with breakfast.  Take 1 tablet (500 mg total) with evening meal.    Dispense:  270 tablet    Refill:  1      Follow up plan: Return in about 3 months (around 10/03/2017) for diabetes.  Wilhelmina Mcardle, DNP, AGPCNP-BC Adult Gerontology Primary Care Nurse Practitioner [facility] 07/07/2017, 10:46 PM

## 2017-07-05 LAB — COMPREHENSIVE METABOLIC PANEL
AG Ratio: 1.5 (calc) (ref 1.0–2.5)
ALT: 17 U/L (ref 6–29)
AST: 21 U/L (ref 10–35)
Albumin: 4.3 g/dL (ref 3.6–5.1)
Alkaline phosphatase (APISO): 105 U/L (ref 33–130)
BUN: 19 mg/dL (ref 7–25)
CO2: 25 mmol/L (ref 20–32)
Calcium: 9.6 mg/dL (ref 8.6–10.4)
Chloride: 101 mmol/L (ref 98–110)
Creat: 0.94 mg/dL (ref 0.50–0.99)
Globulin: 2.9 g/dL (calc) (ref 1.9–3.7)
Glucose, Bld: 228 mg/dL — ABNORMAL HIGH (ref 65–99)
Potassium: 4.1 mmol/L (ref 3.5–5.3)
Sodium: 138 mmol/L (ref 135–146)
Total Bilirubin: 0.5 mg/dL (ref 0.2–1.2)
Total Protein: 7.2 g/dL (ref 6.1–8.1)

## 2017-07-05 LAB — LIPID PANEL
Cholesterol: 247 mg/dL — ABNORMAL HIGH (ref <200)
HDL: 40 mg/dL — ABNORMAL LOW (ref >50)
Non-HDL Cholesterol (Calc): 207 mg/dL (calc) — ABNORMAL HIGH (ref <130)
Total CHOL/HDL Ratio: 6.2 (calc) — ABNORMAL HIGH (ref <5.0)
Triglycerides: 419 mg/dL — ABNORMAL HIGH (ref <150)

## 2017-07-05 LAB — TSH: TSH: 2.64 mIU/L (ref 0.40–4.50)

## 2017-07-07 NOTE — Assessment & Plan Note (Addendum)
Uncontrolled.  A1c is lower than last visit w/ metformin, but pt only taking 1,000 mg once daily.  Pt cites side effects in pm w/ night time dose.  Poor knowledge of disease and appropriate treatment.  Plan: 1. INCREASE to metformin 1,000 mg in am and 500 mg in pm. 2. Reviewed low glycemic diet, basic DM pathophys, exercise. 3. Increase physical activity to 30 minutes most days of the week. 4. Follow up 3 months w/ repeat A1c.

## 2017-07-07 NOTE — Assessment & Plan Note (Signed)
Needs lipid panel today reassess lipid function.  Pt w/ dietary indiscretions and elevated triglycerides in past.  Plan: 1. Discussed starting statin med.  Pt refuses at this time. 2. Discussed possibility of pancreatitis if triglycerides are significantly elevated. 3. Encouraged diet and exercise. 4. Follow up 3 months.

## 2017-07-07 NOTE — Assessment & Plan Note (Signed)
Uncontrolled BP today w/ several elevated BP readings in clinic setting. BP goal < 130/80.  P not currently working on lifestyle modifications.  Taking medications tolerating well without side effects. No current complications.  Plan: 1. Discussed starting medications.  Pt refused despite explaining risk exists for CVA and hypertensive crisis/MI. 2. Obtain labs CMP, lipid, TSH 3. Encouraged heart healthy diet and increasing exercise to 30 minutes most days of the week. 4. Check BP 1-2 x per week at home, keep log, and bring to clinic at next appointment. 5. Follow up 3 months.

## 2017-07-11 ENCOUNTER — Ambulatory Visit: Payer: 59 | Admitting: Nurse Practitioner

## 2017-09-10 ENCOUNTER — Ambulatory Visit (INDEPENDENT_AMBULATORY_CARE_PROVIDER_SITE_OTHER): Payer: 59 | Admitting: Nurse Practitioner

## 2017-09-10 ENCOUNTER — Other Ambulatory Visit: Payer: Self-pay

## 2017-09-10 ENCOUNTER — Encounter: Payer: Self-pay | Admitting: Nurse Practitioner

## 2017-09-10 VITALS — BP 141/60 | HR 100 | Temp 98.4°F | Resp 18 | Ht 61.0 in | Wt 164.8 lb

## 2017-09-10 DIAGNOSIS — J4520 Mild intermittent asthma, uncomplicated: Secondary | ICD-10-CM

## 2017-09-10 DIAGNOSIS — J00 Acute nasopharyngitis [common cold]: Secondary | ICD-10-CM

## 2017-09-10 MED ORDER — ALBUTEROL SULFATE HFA 108 (90 BASE) MCG/ACT IN AERS: 2.0000 | INHALATION_SPRAY | Freq: Four times a day (QID) | RESPIRATORY_TRACT | 11 refills | Status: DC | PRN

## 2017-09-10 MED ORDER — IPRATROPIUM BROMIDE 0.06 % NA SOLN: 2.0000 | Freq: Four times a day (QID) | NASAL | 0 refills | Status: DC

## 2017-09-10 NOTE — Assessment & Plan Note (Signed)
Mild intermittent asthma w/ recent worsening of symptoms 2/2 URI.  Pt w/ mild chest tightness w/ coughing about 2 days ago relieved by albuterol prn.  Plan: 1. Refill albuterol inhaler.  Use 1-2 puffs every 6 hours as needed for wheezing, chest tightness, shortness of breath. 2. Followup as needed.

## 2017-09-10 NOTE — Patient Instructions (Addendum)
Olivia Roth, Thank you for coming in to clinic today.  1. It sounds like you have a Upper Respiratory Virus - this will most likely run it's course in 7 to 10 days. Recommend good hand washing. - Start Atrovent nasal spray decongestant 2 sprays each nostril up to 4 times daily for 5-7 days - Start anti-histamine Zyrtec/ cetirizine 10mg  daily, also can use Flonase 2 sprays each nostril daily for up to 4-6 weeks - If congestion is worse, start OTC Mucinex (or may try Mucinex-DM for cough) up to 7-10 days then stop. - Drink plenty of fluids to improve congestion - You may try over the counter Nasal Saline spray (Simply Saline, Ocean Spray) as needed to reduce congestion. - Drink warm herbal tea with honey for sore throat. - Start taking Tylenol extra strength 1 to 2 tablets every 6-8 hours for aches or fever/chills for next few days as needed.  Do not take more than 3,000 mg in 24 hours from all medicines.  May take Ibuprofen as well if tolerated 200-400mg  every 8 hours as needed.  If symptoms significantly worsening with persistent fevers/chills despite tylenol/ibpurofen, nausea, vomiting unable to tolerate food/fluids or medicine, body aches, or shortness of breath, sinus pain pressure or worsening productive cough, then follow-up for re-evaluation, may seek more immediate care at Urgent Care or ED if more concerned for emergency.  Please schedule a follow-up appointment with Wilhelmina McardleLauren Shandelle Borrelli, AGNP. Return 5-7 days if symptoms worsen or fail to improve.  If you have any other questions or concerns, please feel free to call the clinic or send a message through MyChart. You may also schedule an earlier appointment if necessary.  You will receive a survey after today's visit either digitally by e-mail or paper by [employer] mail. Your experiences and feedback matter to us.  Please respond so we know how we are doing as we provide care for you.   Wilhelmina McardleLauren Michaelpaul Apo, DNP, AGNP-BC Adult Gerontology Nurse  Practitioner [facility], [facility]

## 2017-09-10 NOTE — Progress Notes (Signed)
Subjective:    Patient ID: Olivia Roth, female    DOB: 09/11/1956, 61 y.o.   MRN: [id number]  Olivia Roth is a 61 y.o. female presenting on 09/10/2017 for Sore Throat (cough, productive cough w/ greenish phlegm, and post nasal drainage  x 5 day )   HPI Sore Throat Pt has had URI symptoms for 5 days.  Thursday night started w/ sore throat/scratchiness and worsened on Saturday.  Patient also reports postnasal drainage, coughing, and nasal congestion.  Several days ago patient had some wheezing/chest tightness w/ cough.she used her albuterol as needed inhaler even though it was expired and had symptom relief.  Patient requests albuterol refill today.  Patient denies sinus pressure, headache, ear pain pressure or fullness, and tooth or jaw pain. - Pt denies fever, chills, sweats, nausea, vomiting, diarrhea and constipation. - No other sick contacts.  Works with children. - Patient has taken no OTC meds except for Tylenol twice. -Overall patient feels she is now improving.    Social History   Tobacco Use  . Smoking status: Never Smoker  . Smokeless tobacco: Never Used  Substance Use Topics  . Alcohol use: Yes    Comment: occasional use  . Drug use: No    Review of Systems Per HPI unless specifically indicated above     Objective:    BP (!) 141/60 (BP Location: Right Arm, Patient Position: Sitting, Cuff Size: Normal)   Pulse 100   Temp 98.4 F (36.9 C) (Oral)   Resp 18   Ht 5\' 1"  (1.549 m)   Wt 164 lb 12.8 oz (74.8 kg)   SpO2 98%   BMI 31.14 kg/m   Wt Readings from Last 3 Encounters:  09/10/17 164 lb 12.8 oz (74.8 kg)  07/04/17 166 lb 3.2 oz (75.4 kg)  07/20/16 164 lb (74.4 kg)    Physical Exam  Constitutional: She is oriented to person, place, and time. She appears well-developed and well-nourished. No distress.  HENT:  Head: Normocephalic and atraumatic.  Right Ear: Hearing, tympanic membrane, external ear and ear canal normal.  Left Ear: Hearing, tympanic  membrane, external ear and ear canal normal.  Nose: Mucosal edema and rhinorrhea present. Right sinus exhibits no maxillary sinus tenderness and no frontal sinus tenderness. Left sinus exhibits no maxillary sinus tenderness and no frontal sinus tenderness.  Mouth/Throat: Uvula is midline and mucous membranes are normal. Posterior oropharyngeal edema (cobblestoning) present.  Eyes: Conjunctivae are normal. Pupils are equal, round, and reactive to light.  Neck: Normal range of motion. Neck supple. No JVD present. No tracheal deviation present. No thyromegaly present.  Cardiovascular: Normal rate, regular rhythm, normal heart sounds and intact distal pulses.  Pulmonary/Chest: Effort normal. No accessory muscle usage. No respiratory distress. She has no wheezes. She has no rhonchi. She has rales.  Lymphadenopathy:    She has cervical adenopathy (mild anterior cervical lad).  Neurological: She is alert and oriented to person, place, and time.  Skin: Skin is warm and dry.  Psychiatric: She has a normal mood and affect. Her behavior is normal. Judgment and thought content normal.    Results for orders placed or performed in visit on 07/04/17  Lipid panel  Result Value Ref Range   Cholesterol 247 (H) <200 mg/dL   HDL 40 (L) >40 mg/dL   Triglycerides 981 (H) <150 mg/dL   LDL Cholesterol (Calc)  mg/dL (calc)   Total CHOL/HDL Ratio 6.2 (H) <5.0 (calc)   Non-HDL Cholesterol (Calc) 207 (H) <130  mg/dL (calc)  Comprehensive metabolic panel  Result Value Ref Range   Glucose, Bld 228 (H) 65 - 99 mg/dL   BUN 19 7 - 25 mg/dL   Creat 8.290.94 5.620.50 - 1.300.99 mg/dL   BUN/Creatinine Ratio NOT APPLICABLE 6 - 22 (calc)   Sodium 138 135 - 146 mmol/L   Potassium 4.1 3.5 - 5.3 mmol/L   Chloride 101 98 - 110 mmol/L   CO2 25 20 - 32 mmol/L   Calcium 9.6 8.6 - 10.4 mg/dL   Total Protein 7.2 6.1 - 8.1 g/dL   Albumin 4.3 3.6 - 5.1 g/dL   Globulin 2.9 1.9 - 3.7 g/dL (calc)   AG Ratio 1.5 1.0 - 2.5 (calc)   Total  Bilirubin 0.5 0.2 - 1.2 mg/dL   Alkaline phosphatase (APISO) 105 33 - 130 U/L   AST 21 10 - 35 U/L   ALT 17 6 - 29 U/L  TSH  Result Value Ref Range   TSH 2.64 0.40 - 4.50 mIU/L  POCT glycosylated hemoglobin (Hb A1C)  Result Value Ref Range   Hemoglobin A1C 9.8       Assessment & Plan:   Problem List Items Addressed This Visit      Respiratory   Asthma    Mild intermittent asthma w/ recent worsening of symptoms 2/2 URI.  Pt w/ mild chest tightness w/ coughing about 2 days ago relieved by albuterol prn.  Plan: 1. Refill albuterol inhaler.  Use 1-2 puffs every 6 hours as needed for wheezing, chest tightness, shortness of breath. 2. Followup as needed.      Relevant Medications   albuterol (PROVENTIL HFA;VENTOLIN HFA) 108 (90 Base) MCG/ACT inhaler    Other Visit Diagnoses    Acute nasopharyngitis    -  Primary Acute illness. Fever responsive to NSAIDs and tylenol.  Symptoms not worsening. Consistent with viral illness x 5 days with no known sick contacts and no identifiable focal infections of ears, nose, throat.  Plan: 1. Reassurance, likely self-limited with cough lasting up to few weeks - Start Atrovent nasal spray decongestant 2 sprays each nostril up to 4 times daily for 5-7 days - Start anti-histamine Cetirizine 10mg  daily,  - also can use Flonase 2 sprays each nostril daily for up to 4-6 weeks - Start Mucinex-DM OTC up to 7-10 days then stop 2. Supportive care with nasal saline, warm herbal tea with honey, 3. Improve hydration 4. Tylenol / Motrin PRN fevers 5. Return criteria given    Relevant Medications   ipratropium (ATROVENT) 0.06 % nasal spray      Meds ordered this encounter  Medications  . ipratropium (ATROVENT) 0.06 % nasal spray    Sig: Place 2 sprays into both nostrils 4 (four) times daily.    Dispense:  15 mL    Refill:  0    Order Specific Question:   Supervising Provider    Answer:   Smitty CordsKARAMALEGOS, ALEXANDER J [[id number]]  . albuterol (PROVENTIL  HFA;VENTOLIN HFA) 108 (90 Base) MCG/ACT inhaler    Sig: Inhale 2 puffs into the lungs every 6 (six) hours as needed for wheezing or shortness of breath.    Dispense:  1 Inhaler    Refill:  11    Order Specific Question:   Supervising Provider    Answer:   Smitty CordsKARAMALEGOS, ALEXANDER J [[id number]]    Follow up plan: Return 5-7 days if symptoms worsen or fail to improve.  Wilhelmina McardleLauren Aveah Castell, DNP, AGPCNP-BC Adult Gerontology Primary Care Nurse Practitioner [facility] 09/10/2017, 9:05 AM

## 2017-09-20 ENCOUNTER — Telehealth: Payer: Self-pay

## 2017-09-20 NOTE — Telephone Encounter (Signed)
Patient called stating that her cold went away.  However, she now is coughing and has congestion again. She also complain of green/yellow mucus.  She is requesting a abx sent to [facility].

## 2017-09-20 NOTE — Telephone Encounter (Signed)
Pt should continue the same OTC medications she used for her last symptoms.   - Mucinex or Mucinex DM, Flonase, ipratropium nasal spray.   - She should also add antihistamine cetirizine/Zyrtec once daily to prevent mucous production from allergic sources.  If symptoms persist for a total of 10 days since most recent onset, she should schedule clinic visit for evaluation.

## 2017-09-20 NOTE — Telephone Encounter (Signed)
The pt was notified. No questions or concerns. 

## 2017-10-05 ENCOUNTER — Ambulatory Visit (INDEPENDENT_AMBULATORY_CARE_PROVIDER_SITE_OTHER): Payer: 59 | Admitting: Nurse Practitioner

## 2017-10-05 ENCOUNTER — Encounter: Payer: Self-pay | Admitting: Nurse Practitioner

## 2017-10-05 ENCOUNTER — Other Ambulatory Visit: Payer: Self-pay

## 2017-10-05 VITALS — BP 147/71 | HR 102 | Temp 98.6°F | Ht 61.0 in | Wt 163.2 lb

## 2017-10-05 DIAGNOSIS — E1165 Type 2 diabetes mellitus with hyperglycemia: Secondary | ICD-10-CM | POA: Diagnosis not present

## 2017-10-05 LAB — COMPREHENSIVE METABOLIC PANEL
AG Ratio: 1.4 (calc) (ref 1.0–2.5)
ALT: 14 U/L (ref 6–29)
AST: 15 U/L (ref 10–35)
Albumin: 4.1 g/dL (ref 3.6–5.1)
Alkaline phosphatase (APISO): 108 U/L (ref 33–130)
BUN: 20 mg/dL (ref 7–25)
CO2: 24 mmol/L (ref 20–32)
Calcium: 9.2 mg/dL (ref 8.6–10.4)
Chloride: 103 mmol/L (ref 98–110)
Creat: 0.81 mg/dL (ref 0.50–0.99)
Globulin: 2.9 g/dL (calc) (ref 1.9–3.7)
Glucose, Bld: 273 mg/dL — ABNORMAL HIGH (ref 65–99)
Potassium: 4.3 mmol/L (ref 3.5–5.3)
Sodium: 138 mmol/L (ref 135–146)
Total Bilirubin: 0.4 mg/dL (ref 0.2–1.2)
Total Protein: 7 g/dL (ref 6.1–8.1)

## 2017-10-05 LAB — POCT GLYCOSYLATED HEMOGLOBIN (HGB A1C): Hemoglobin A1C: 10.9

## 2017-10-05 MED ORDER — SITAGLIPTIN PHOSPHATE 50 MG PO TABS: 50.0000 mg | ORAL_TABLET | Freq: Every day | ORAL | 5 refills | Status: DC

## 2017-10-05 NOTE — Patient Instructions (Addendum)
Olivia Roth, Thank you for coming in to clinic today.  1. You can start taking red yeast rice to help your cholesterol. Each capsule is 600 mg - Take 2 capsules in am and 2 capules in pm.  2. For diabetes: - Take metformin 500 mg tablet.  Take 2 tablets in am and 2 tablets in pm. - START sitagliptin 50 mg tablet.  Take 1 tablet once daily.  - CHECK a fasting morning blood sugar every day!!!  This helps you know what your medicines are doing for you and it helps you know what your food choices are doing to raise your blood sugar. - Bring your blood sugar log to your next appointment.  Please schedule a follow-up appointment with Olivia Roth, AGNP. Return in about 4 weeks (around 11/02/2017) for diabetes.   If you have any other questions or concerns, please feel free to call the clinic or send a message through MyChart. You may also schedule an earlier appointment if necessary.  You will receive a survey after today's visit either digitally by e-mail or paper by [employer] mail. Your experiences and feedback matter to us.  Please respond so we know how we are doing as we provide care for you.  Olivia McardleLauren Marysol Wellnitz, DNP, AGNP-BC Adult Gerontology Nurse Practitioner [facility], [facility]

## 2017-10-05 NOTE — Progress Notes (Signed)
Subjective:    Patient ID: Olivia Roth, female    DOB: May 27, 1956, 61 y.o.   MRN: [id number]  Olivia MoselleMaria D Lashomb is a 61 y.o. female presenting on 10/05/2017 for Diabetes   HPI Diabetes Pt presents today for follow up of Type 2 diabetes mellitus. She is not checking CBG at home - Current diabetic medications include: metformin 2,000 mg daily (1,000 mg bid) and not taking glimepiride. - She is not currently symptomatic.  - She denies polydipsia, polyphagia, polyuria, headaches, diaphoresis, shakiness, chills, pain, numbness or tingling in extremities and changes in vision.   - Clinical course has been worsening. - She  reports no regular exercise routine.  - Her diet is high in salt, high in fat, and high in carbohydrates. - Weight trend: stable  PREVENTION: Eye exam current (within one year): no Foot exam current (within one year): no Lipid/ASCVD risk reduction - on statin: Pt declines.  Is willing to start red yeast rice nutritional supplement. Kidney protection - on ace or arb: Pt declines. Recent Labs    07/04/17 0844 10/05/17 0946  HGBA1C 9.8 10.9    Social History   Tobacco Use  . Smoking status: Never Smoker  . Smokeless tobacco: Never Used  Substance Use Topics  . Alcohol use: Yes    Comment: occasional use  . Drug use: No    Review of Systems Per HPI unless specifically indicated above     Objective:    BP (!) 147/71 (BP Location: Right Arm, Patient Position: Sitting, Cuff Size: Normal)   Pulse (!) 102   Temp 98.6 F (37 C) (Oral)   Ht 5\' 1"  (1.549 m)   Wt 163 lb 3.2 oz (74 kg)   BMI 30.84 kg/m   Wt Readings from Last 3 Encounters:  10/05/17 163 lb 3.2 oz (74 kg)  09/10/17 164 lb 12.8 oz (74.8 kg)  07/04/17 166 lb 3.2 oz (75.4 kg)    Physical Exam General - obese, well-appearing, NAD HEENT - Normocephalic, atraumatic Neck - supple, non-tender, no LAD, no thyromegaly, no carotid bruit Heart - RRR, no murmurs heard Lungs - Clear throughout all  lobes, no wheezing, crackles, or rhonchi. Normal work of breathing. Extremeties - non-tender, no edema, cap refill < 2 seconds, peripheral pulses intact +2 bilaterally Skin - warm, dry Neuro - awake, alert, oriented x3, normal gait Psych - Normal mood and affect, normal behavior   Diabetic Foot Exam - Simple   Simple Foot Form Diabetic Foot exam was performed with the following findings:  Yes 10/05/2017 11:15 AM  Visual Inspection No deformities, no ulcerations, no other skin breakdown bilaterally:  Yes Sensation Testing Intact to touch and monofilament testing bilaterally:  Yes Pulse Check Posterior Tibialis and Dorsalis pulse intact bilaterally:  Yes Comments     Results for orders placed or performed in visit on 10/05/17  Comprehensive metabolic panel  Result Value Ref Range   Glucose, Bld 273 (H) 65 - 99 mg/dL   BUN 20 7 - 25 mg/dL   Creat 4.090.81 8.110.50 - 9.140.99 mg/dL   BUN/Creatinine Ratio NOT APPLICABLE 6 - 22 (calc)   Sodium 138 135 - 146 mmol/L   Potassium 4.3 3.5 - 5.3 mmol/L   Chloride 103 98 - 110 mmol/L   CO2 24 20 - 32 mmol/L   Calcium 9.2 8.6 - 10.4 mg/dL   Total Protein 7.0 6.1 - 8.1 g/dL   Albumin 4.1 3.6 - 5.1 g/dL   Globulin 2.9 1.9 - 3.7  g/dL (calc)   AG Ratio 1.4 1.0 - 2.5 (calc)   Total Bilirubin 0.4 0.2 - 1.2 mg/dL   Alkaline phosphatase (APISO) 108 33 - 130 U/L   AST 15 10 - 35 U/L   ALT 14 6 - 29 U/L  POCT glycosylated hemoglobin (Hb A1C)  Result Value Ref Range   Hemoglobin A1C 10.9       Assessment & Plan:   Problem List Items Addressed This Visit      Endocrine   DM (diabetes mellitus), type 2, uncontrolled (HCC) - Primary    Uncontrolled with increased A1c. Pt is currently only taking 1,000 mg metformin.  Continues to have resistance of medications.  Pt cites fear of hypoglycemia and not waking from sleep with diabetes meds and living alone.  Overall, pt also had poor knowledge of disease and appropriate treatment.  Health beliefs strongly  guiding desire to avoid medications.  Plan: 1. INCREASE to metformin 1,000 mg in am and 1,000 mg in pm. - START Januvia 50 mg once daily. 2. Reviewed low glycemic diet, basic DM pathophys, exercise. 3. Increase physical activity to 30 minutes most days of the week. 4. START checking fasting am CBG. 5. Follow up 4 weeks w/ repeat A1c.      Relevant Medications   sitaGLIPtin (JANUVIA) 50 MG tablet   Other Relevant Orders   POCT glycosylated hemoglobin (Hb A1C) (Completed)   Comprehensive metabolic panel (Completed)      Meds ordered this encounter  Medications  . sitaGLIPtin (JANUVIA) 50 MG tablet    Sig: Take 1 tablet (50 mg total) by mouth daily.    Dispense:  30 tablet    Refill:  5    Order Specific Question:   Supervising Provider    Answer:   Smitty CordsKARAMALEGOS, ALEXANDER J [[id number]]     Follow up plan: Return in about 4 weeks (around 11/02/2017) for diabetes.  A total of 40 minutes was spent face-to-face with this patient. Greater than 50% of this time was spent in counseling and coordination of care with the patient regarding diabetes diagnosis, need for treatment consistently, multiple medications, adherence to good lifestyle, CBG checks.   Wilhelmina McardleLauren Aryanah Enslow, DNP, AGPCNP-BC Adult Gerontology Primary Care Nurse Practitioner [facility] 10/08/2017, 1:12 PM

## 2017-10-08 ENCOUNTER — Encounter: Payer: Self-pay | Admitting: Nurse Practitioner

## 2017-10-08 NOTE — Assessment & Plan Note (Signed)
Uncontrolled with increased A1c. Pt is currently only taking 1,000 mg metformin.  Continues to have resistance of medications.  Pt cites fear of hypoglycemia and not waking from sleep with diabetes meds and living alone.  Overall, pt also had poor knowledge of disease and appropriate treatment.  Health beliefs strongly guiding desire to avoid medications.  Plan: 1. INCREASE to metformin 1,000 mg in am and 1,000 mg in pm. - START Januvia 50 mg once daily. 2. Reviewed low glycemic diet, basic DM pathophys, exercise. 3. Increase physical activity to 30 minutes most days of the week. 4. START checking fasting am CBG. 5. Follow up 4 weeks w/ repeat A1c.

## 2017-11-08 ENCOUNTER — Ambulatory Visit: Payer: Managed Care, Other (non HMO) | Admitting: Nurse Practitioner

## 2018-02-11 ENCOUNTER — Other Ambulatory Visit: Payer: Self-pay | Admitting: Nurse Practitioner

## 2018-02-11 DIAGNOSIS — E1165 Type 2 diabetes mellitus with hyperglycemia: Secondary | ICD-10-CM

## 2018-04-08 ENCOUNTER — Other Ambulatory Visit: Payer: Self-pay | Admitting: Family Medicine

## 2018-04-08 DIAGNOSIS — E1165 Type 2 diabetes mellitus with hyperglycemia: Secondary | ICD-10-CM

## 2018-05-07 ENCOUNTER — Other Ambulatory Visit: Payer: Self-pay | Admitting: Nurse Practitioner

## 2018-05-07 DIAGNOSIS — E1165 Type 2 diabetes mellitus with hyperglycemia: Secondary | ICD-10-CM

## 2018-07-08 ENCOUNTER — Other Ambulatory Visit: Payer: Self-pay | Admitting: Nurse Practitioner

## 2018-07-08 DIAGNOSIS — E1165 Type 2 diabetes mellitus with hyperglycemia: Secondary | ICD-10-CM

## 2018-07-09 ENCOUNTER — Other Ambulatory Visit: Payer: Self-pay | Admitting: Nurse Practitioner

## 2018-07-09 DIAGNOSIS — E1165 Type 2 diabetes mellitus with hyperglycemia: Secondary | ICD-10-CM

## 2018-08-12 ENCOUNTER — Telehealth: Payer: Self-pay | Admitting: Nurse Practitioner

## 2018-08-12 ENCOUNTER — Other Ambulatory Visit: Payer: Self-pay

## 2018-08-12 ENCOUNTER — Other Ambulatory Visit: Payer: Self-pay | Admitting: Nurse Practitioner

## 2018-08-12 DIAGNOSIS — E1165 Type 2 diabetes mellitus with hyperglycemia: Secondary | ICD-10-CM

## 2018-08-12 NOTE — Telephone Encounter (Signed)
Pt needs refill on metformin sent to [facility]

## 2018-08-21 ENCOUNTER — Inpatient Hospital Stay
Admission: EM | Admit: 2018-08-21 | Discharge: 2018-08-21 | DRG: 282 | Disposition: A | Discharge status: Other Acute Inpatient Hospital | Payer: Managed Care, Other (non HMO) | Attending: Cardiology | Admitting: Cardiology

## 2018-08-21 ENCOUNTER — Other Ambulatory Visit: Payer: Self-pay

## 2018-08-21 ENCOUNTER — Inpatient Hospital Stay (HOSPITAL_COMMUNITY)
Admission: AD | Admit: 2018-08-21 | Discharge: 2018-08-31 | DRG: 234 | Disposition: A | Discharge status: Home / Self Care | Payer: Managed Care, Other (non HMO) | Source: Other Acute Inpatient Hospital | Attending: Cardiothoracic Surgery | Admitting: Cardiothoracic Surgery

## 2018-08-21 ENCOUNTER — Encounter: Payer: Self-pay | Admitting: Certified Registered Nurse Anesthetist

## 2018-08-21 ENCOUNTER — Encounter: Payer: Self-pay | Admitting: Emergency Medicine

## 2018-08-21 ENCOUNTER — Encounter
Admission: EM | Disposition: A | Discharge status: Other Acute Inpatient Hospital | Payer: Self-pay | Source: Home / Self Care | Attending: Internal Medicine

## 2018-08-21 ENCOUNTER — Emergency Department: Payer: Managed Care, Other (non HMO)

## 2018-08-21 ENCOUNTER — Encounter: Payer: Self-pay | Admitting: Internal Medicine

## 2018-08-21 DIAGNOSIS — Z01810 Encounter for preprocedural cardiovascular examination: Secondary | ICD-10-CM | POA: Diagnosis not present

## 2018-08-21 DIAGNOSIS — Z91018 Allergy to other foods: Secondary | ICD-10-CM

## 2018-08-21 DIAGNOSIS — E7800 Pure hypercholesterolemia, unspecified: Secondary | ICD-10-CM | POA: Diagnosis not present

## 2018-08-21 DIAGNOSIS — I25110 Atherosclerotic heart disease of native coronary artery with unstable angina pectoris: Secondary | ICD-10-CM | POA: Diagnosis not present

## 2018-08-21 DIAGNOSIS — E669 Obesity, unspecified: Secondary | ICD-10-CM | POA: Diagnosis present

## 2018-08-21 DIAGNOSIS — Z90710 Acquired absence of both cervix and uterus: Secondary | ICD-10-CM | POA: Diagnosis not present

## 2018-08-21 DIAGNOSIS — I2510 Atherosclerotic heart disease of native coronary artery without angina pectoris: Secondary | ICD-10-CM | POA: Diagnosis present

## 2018-08-21 DIAGNOSIS — R0602 Shortness of breath: Secondary | ICD-10-CM

## 2018-08-21 DIAGNOSIS — E785 Hyperlipidemia, unspecified: Secondary | ICD-10-CM | POA: Diagnosis present

## 2018-08-21 DIAGNOSIS — E8770 Fluid overload, unspecified: Secondary | ICD-10-CM | POA: Diagnosis not present

## 2018-08-21 DIAGNOSIS — R42 Dizziness and giddiness: Secondary | ICD-10-CM | POA: Diagnosis present

## 2018-08-21 DIAGNOSIS — Z23 Encounter for immunization: Secondary | ICD-10-CM

## 2018-08-21 DIAGNOSIS — I255 Ischemic cardiomyopathy: Secondary | ICD-10-CM | POA: Diagnosis present

## 2018-08-21 DIAGNOSIS — Z7984 Long term (current) use of oral hypoglycemic drugs: Secondary | ICD-10-CM

## 2018-08-21 DIAGNOSIS — E1165 Type 2 diabetes mellitus with hyperglycemia: Secondary | ICD-10-CM | POA: Diagnosis present

## 2018-08-21 DIAGNOSIS — Z79899 Other long term (current) drug therapy: Secondary | ICD-10-CM | POA: Diagnosis not present

## 2018-08-21 DIAGNOSIS — Z91013 Allergy to seafood: Secondary | ICD-10-CM | POA: Diagnosis not present

## 2018-08-21 DIAGNOSIS — Z833 Family history of diabetes mellitus: Secondary | ICD-10-CM | POA: Diagnosis not present

## 2018-08-21 DIAGNOSIS — I25119 Atherosclerotic heart disease of native coronary artery with unspecified angina pectoris: Secondary | ICD-10-CM | POA: Diagnosis present

## 2018-08-21 DIAGNOSIS — J90 Pleural effusion, not elsewhere classified: Secondary | ICD-10-CM

## 2018-08-21 DIAGNOSIS — K219 Gastro-esophageal reflux disease without esophagitis: Secondary | ICD-10-CM | POA: Diagnosis present

## 2018-08-21 DIAGNOSIS — Z888 Allergy status to other drugs, medicaments and biological substances status: Secondary | ICD-10-CM

## 2018-08-21 DIAGNOSIS — E11649 Type 2 diabetes mellitus with hypoglycemia without coma: Secondary | ICD-10-CM | POA: Diagnosis not present

## 2018-08-21 DIAGNOSIS — N390 Urinary tract infection, site not specified: Secondary | ICD-10-CM | POA: Diagnosis present

## 2018-08-21 DIAGNOSIS — Z6831 Body mass index (BMI) 31.0-31.9, adult: Secondary | ICD-10-CM

## 2018-08-21 DIAGNOSIS — I10 Essential (primary) hypertension: Secondary | ICD-10-CM | POA: Diagnosis present

## 2018-08-21 DIAGNOSIS — D62 Acute posthemorrhagic anemia: Secondary | ICD-10-CM | POA: Diagnosis not present

## 2018-08-21 DIAGNOSIS — Z90722 Acquired absence of ovaries, bilateral: Secondary | ICD-10-CM

## 2018-08-21 DIAGNOSIS — I420 Dilated cardiomyopathy: Secondary | ICD-10-CM | POA: Diagnosis present

## 2018-08-21 DIAGNOSIS — J302 Other seasonal allergic rhinitis: Secondary | ICD-10-CM | POA: Diagnosis present

## 2018-08-21 DIAGNOSIS — Z6830 Body mass index (BMI) 30.0-30.9, adult: Secondary | ICD-10-CM

## 2018-08-21 DIAGNOSIS — R070 Pain in throat: Secondary | ICD-10-CM | POA: Diagnosis present

## 2018-08-21 DIAGNOSIS — K5900 Constipation, unspecified: Secondary | ICD-10-CM | POA: Diagnosis not present

## 2018-08-21 DIAGNOSIS — J45909 Unspecified asthma, uncomplicated: Secondary | ICD-10-CM | POA: Diagnosis present

## 2018-08-21 DIAGNOSIS — Z9079 Acquired absence of other genital organ(s): Secondary | ICD-10-CM | POA: Diagnosis not present

## 2018-08-21 DIAGNOSIS — R079 Chest pain, unspecified: Secondary | ICD-10-CM | POA: Diagnosis present

## 2018-08-21 DIAGNOSIS — I252 Old myocardial infarction: Secondary | ICD-10-CM | POA: Diagnosis present

## 2018-08-21 DIAGNOSIS — I214 Non-ST elevation (NSTEMI) myocardial infarction: Principal | ICD-10-CM

## 2018-08-21 DIAGNOSIS — I351 Nonrheumatic aortic (valve) insufficiency: Secondary | ICD-10-CM | POA: Diagnosis not present

## 2018-08-21 DIAGNOSIS — Z8611 Personal history of tuberculosis: Secondary | ICD-10-CM | POA: Diagnosis not present

## 2018-08-21 DIAGNOSIS — E1100 Type 2 diabetes mellitus with hyperosmolarity without nonketotic hyperglycemic-hyperosmolar coma (NKHHC): Secondary | ICD-10-CM | POA: Diagnosis not present

## 2018-08-21 DIAGNOSIS — Z951 Presence of aortocoronary bypass graft: Secondary | ICD-10-CM

## 2018-08-21 DIAGNOSIS — E119 Type 2 diabetes mellitus without complications: Secondary | ICD-10-CM | POA: Diagnosis not present

## 2018-08-21 DIAGNOSIS — J9811 Atelectasis: Secondary | ICD-10-CM | POA: Diagnosis not present

## 2018-08-21 HISTORY — DX: Gastro-esophageal reflux disease without esophagitis: K21.9

## 2018-08-21 HISTORY — PX: LEFT HEART CATH AND CORONARY ANGIOGRAPHY: CATH118249

## 2018-08-21 HISTORY — DX: Other seasonal allergic rhinitis: J30.2

## 2018-08-21 HISTORY — DX: Family history of other specified conditions: Z84.89

## 2018-08-21 HISTORY — DX: Respiratory tuberculosis unspecified: A15.9

## 2018-08-21 HISTORY — DX: Pure hypercholesterolemia, unspecified: E78.00

## 2018-08-21 HISTORY — DX: Anxiety disorder, unspecified: F41.9

## 2018-08-21 HISTORY — DX: Type 2 diabetes mellitus without complications: E11.9

## 2018-08-21 LAB — HEMOGLOBIN A1C
Hgb A1c MFr Bld: 8.4 % — ABNORMAL HIGH (ref 4.8–5.6)
Mean Plasma Glucose: 194.38 mg/dL

## 2018-08-21 LAB — MRSA PCR SCREENING: MRSA by PCR: NEGATIVE

## 2018-08-21 LAB — CBC
HCT: 43.7 % (ref 36.0–46.0)
Hemoglobin: 14.6 g/dL (ref 12.0–15.0)
MCH: 30.1 pg (ref 26.0–34.0)
MCHC: 33.4 g/dL (ref 30.0–36.0)
MCV: 90.1 fL (ref 80.0–100.0)
Platelets: 255 10*3/uL (ref 150–400)
RBC: 4.85 MIL/uL (ref 3.87–5.11)
RDW: 12.7 % (ref 11.5–15.5)
WBC: 8.4 10*3/uL (ref 4.0–10.5)
nRBC: 0 % (ref 0.0–0.2)

## 2018-08-21 LAB — POCT I-STAT, CHEM 8
BUN: 19 mg/dL (ref 8–23)
BUN: 19 mg/dL (ref 8–23)
Calcium, Ion: 1.17 mmol/L (ref 1.15–1.40)
Calcium, Ion: 1.15 mmol/L (ref 1.15–1.40)
Chloride: 106 mmol/L (ref 98–111)
Chloride: 107 mmol/L (ref 98–111)
Creatinine, Ser: 0.8 mg/dL (ref 0.44–1.00)
Creatinine, Ser: 0.8 mg/dL (ref 0.44–1.00)
Glucose, Bld: 242 mg/dL — ABNORMAL HIGH (ref 70–99)
Glucose, Bld: 238 mg/dL — ABNORMAL HIGH (ref 70–99)
HCT: 44 % (ref 36.0–46.0)
HCT: 43 % (ref 36.0–46.0)
Hemoglobin: 15 g/dL (ref 12.0–15.0)
Hemoglobin: 14.6 g/dL (ref 12.0–15.0)
Potassium: 3.9 mmol/L (ref 3.5–5.1)
Potassium: 3.9 mmol/L (ref 3.5–5.1)
Sodium: 139 mmol/L (ref 135–145)
Sodium: 139 mmol/L (ref 135–145)
TCO2: 24 mmol/L (ref 22–32)
TCO2: 24 mmol/L (ref 22–32)

## 2018-08-21 LAB — MAGNESIUM: Magnesium: 1.9 mg/dL (ref 1.7–2.4)

## 2018-08-21 LAB — COMPREHENSIVE METABOLIC PANEL
ALT: 19 U/L (ref 0–44)
AST: 24 U/L (ref 15–41)
Albumin: 4.2 g/dL (ref 3.5–5.0)
Alkaline Phosphatase: 75 U/L (ref 38–126)
Anion gap: 12 (ref 5–15)
BUN: 17 mg/dL (ref 8–23)
CO2: 23 mmol/L (ref 22–32)
Calcium: 9 mg/dL (ref 8.9–10.3)
Chloride: 106 mmol/L (ref 98–111)
Creatinine, Ser: 0.81 mg/dL (ref 0.44–1.00)
GFR calc Af Amer: >60 mL/min (ref >60)
GFR calc non Af Amer: >60 mL/min (ref >60)
Glucose, Bld: 234 mg/dL — ABNORMAL HIGH (ref 70–99)
Potassium: 3.5 mmol/L (ref 3.5–5.1)
Sodium: 141 mmol/L (ref 135–145)
Total Bilirubin: 0.9 mg/dL (ref 0.3–1.2)
Total Protein: 7.5 g/dL (ref 6.5–8.1)

## 2018-08-21 LAB — TROPONIN I
Troponin I: 0.14 ng/mL (ref <0.03)
Troponin I: 0.29 ng/mL (ref <0.03)
Troponin I: 2.63 ng/mL (ref <0.03)
Troponin I: 2.57 ng/mL (ref <0.03)

## 2018-08-21 LAB — APTT: aPTT: 30 seconds (ref 24–36)

## 2018-08-21 LAB — HEPARIN LEVEL (UNFRACTIONATED): Heparin Unfractionated: <0.1 IU/mL — ABNORMAL LOW (ref 0.30–0.70)

## 2018-08-21 LAB — GLUCOSE, CAPILLARY
Glucose-Capillary: 230 mg/dL — ABNORMAL HIGH (ref 70–99)
Glucose-Capillary: 260 mg/dL — ABNORMAL HIGH (ref 70–99)
Glucose-Capillary: 255 mg/dL — ABNORMAL HIGH (ref 70–99)

## 2018-08-21 LAB — LIPID PANEL
Cholesterol: 252 mg/dL — ABNORMAL HIGH (ref 0–200)
HDL: 33 mg/dL — ABNORMAL LOW (ref >40)
LDL Cholesterol: 147 mg/dL — ABNORMAL HIGH (ref 0–99)
Total CHOL/HDL Ratio: 7.6 RATIO
Triglycerides: 360 mg/dL — ABNORMAL HIGH (ref <150)
VLDL: 72 mg/dL — ABNORMAL HIGH (ref 0–40)

## 2018-08-21 LAB — PHOSPHORUS: Phosphorus: 3.4 mg/dL (ref 2.5–4.6)

## 2018-08-21 LAB — PROTIME-INR
INR: 1.02
Prothrombin Time: 13.3 seconds (ref 11.4–15.2)

## 2018-08-21 LAB — LIPASE, BLOOD: Lipase: 32 U/L (ref 11–51)

## 2018-08-21 LAB — BRAIN NATRIURETIC PEPTIDE: B Natriuretic Peptide: 97 pg/mL (ref 0.0–100.0)

## 2018-08-21 SURGERY — LEFT HEART CATH AND CORONARY ANGIOGRAPHY
Anesthesia: Moderate Sedation

## 2018-08-21 MED ORDER — SENNOSIDES-DOCUSATE SODIUM 8.6-50 MG PO TABS: 1.0000 | ORAL_TABLET | Freq: Every evening | ORAL | Status: DC | PRN

## 2018-08-21 MED ORDER — HEPARIN (PORCINE) 25000 UT/250ML-% IV SOLN
700.0000 [IU]/h | INTRAVENOUS | Status: DC
  Administered 2018-08-21: 700 [IU]/h via INTRAVENOUS
  Filled 2018-08-21: qty 250

## 2018-08-21 MED ORDER — ASPIRIN EC 81 MG PO TBEC
81.0000 mg | DELAYED_RELEASE_TABLET | Freq: Every day | ORAL | Status: DC
  Administered 2018-08-22 – 2018-08-25 (×4): 81 mg via ORAL
  Filled 2018-08-21 (×4): qty 1

## 2018-08-21 MED ORDER — SODIUM CHLORIDE 0.9% FLUSH: 3.0000 mL | INTRAVENOUS | Status: DC | PRN

## 2018-08-21 MED ORDER — ONDANSETRON HCL 4 MG/2ML IJ SOLN: 4.0000 mg | Freq: Four times a day (QID) | INTRAMUSCULAR | Status: DC | PRN

## 2018-08-21 MED ORDER — GI COCKTAIL ~~LOC~~
ORAL | Status: AC
  Administered 2018-08-21: 30 mL
  Filled 2018-08-21: qty 30

## 2018-08-21 MED ORDER — VERAPAMIL HCL 2.5 MG/ML IV SOLN
INTRAVENOUS | Status: AC
  Filled 2018-08-21: qty 2

## 2018-08-21 MED ORDER — NITROGLYCERIN 0.4 MG SL SUBL: 0.4000 mg | SUBLINGUAL_TABLET | SUBLINGUAL | Status: DC | PRN

## 2018-08-21 MED ORDER — ATORVASTATIN CALCIUM 80 MG PO TABS
80.0000 mg | ORAL_TABLET | Freq: Every day | ORAL | Status: DC
  Filled 2018-08-21: qty 1

## 2018-08-21 MED ORDER — ATORVASTATIN CALCIUM 80 MG PO TABS
80.0000 mg | ORAL_TABLET | Freq: Every day | ORAL | Status: DC
  Administered 2018-08-21 – 2018-08-30 (×10): 80 mg via ORAL
  Filled 2018-08-21 (×10): qty 1

## 2018-08-21 MED ORDER — HEPARIN SODIUM (PORCINE) 1000 UNIT/ML IJ SOLN
INTRAMUSCULAR | Status: DC | PRN
  Administered 2018-08-21: 4000 [IU] via INTRAVENOUS

## 2018-08-21 MED ORDER — MIDAZOLAM HCL 2 MG/2ML IJ SOLN
INTRAMUSCULAR | Status: DC | PRN
  Administered 2018-08-21: 1 mg via INTRAVENOUS

## 2018-08-21 MED ORDER — SODIUM CHLORIDE 0.9 % IV SOLN: 250.0000 mL | INTRAVENOUS | Status: DC | PRN

## 2018-08-21 MED ORDER — HEPARIN (PORCINE) IN NACL 1000-0.9 UT/500ML-% IV SOLN
INTRAVENOUS | Status: AC
  Filled 2018-08-21: qty 1000

## 2018-08-21 MED ORDER — HEPARIN SODIUM (PORCINE) 5000 UNIT/ML IJ SOLN: 60.0000 [IU]/kg | Freq: Once | INTRAMUSCULAR | Status: DC

## 2018-08-21 MED ORDER — MORPHINE SULFATE (PF) 2 MG/ML IV SOLN
2.0000 mg | INTRAVENOUS | Status: DC | PRN
  Filled 2018-08-21: qty 2

## 2018-08-21 MED ORDER — FENTANYL CITRATE (PF) 100 MCG/2ML IJ SOLN
INTRAMUSCULAR | Status: DC | PRN
  Administered 2018-08-21: 50 ug via INTRAVENOUS

## 2018-08-21 MED ORDER — INSULIN ASPART 100 UNIT/ML ~~LOC~~ SOLN: 0.0000 [IU] | Freq: Three times a day (TID) | SUBCUTANEOUS | Status: DC

## 2018-08-21 MED ORDER — ACETAMINOPHEN 325 MG PO TABS: 650.0000 mg | ORAL_TABLET | ORAL | Status: DC | PRN

## 2018-08-21 MED ORDER — SODIUM CHLORIDE 0.9 % IV BOLUS
1000.0000 mL | Freq: Once | INTRAVENOUS | Status: AC
  Administered 2018-08-21: 1000 mL via INTRAVENOUS

## 2018-08-21 MED ORDER — HEPARIN (PORCINE) 25000 UT/250ML-% IV SOLN: 750.0000 [IU]/h | INTRAVENOUS | Status: DC

## 2018-08-21 MED ORDER — ASPIRIN 81 MG PO CHEW: 81.0000 mg | CHEWABLE_TABLET | Freq: Every day | ORAL | Status: DC

## 2018-08-21 MED ORDER — INSULIN ASPART 100 UNIT/ML ~~LOC~~ SOLN
0.0000 [IU] | Freq: Three times a day (TID) | SUBCUTANEOUS | Status: DC
  Administered 2018-08-21: 5 [IU] via SUBCUTANEOUS
  Administered 2018-08-22 (×2): 2 [IU] via SUBCUTANEOUS
  Administered 2018-08-22 – 2018-08-23 (×2): 3 [IU] via SUBCUTANEOUS
  Administered 2018-08-23: 2 [IU] via SUBCUTANEOUS
  Administered 2018-08-23: 3 [IU] via SUBCUTANEOUS
  Administered 2018-08-24: 2 [IU] via SUBCUTANEOUS
  Administered 2018-08-24: 3 [IU] via SUBCUTANEOUS
  Administered 2018-08-24: 2 [IU] via SUBCUTANEOUS
  Administered 2018-08-25: 1 [IU] via SUBCUTANEOUS
  Administered 2018-08-25: 3 [IU] via SUBCUTANEOUS
  Administered 2018-08-25: 2 [IU] via SUBCUTANEOUS

## 2018-08-21 MED ORDER — INSULIN ASPART 100 UNIT/ML ~~LOC~~ SOLN: 0.0000 [IU] | Freq: Every day | SUBCUTANEOUS | Status: DC

## 2018-08-21 MED ORDER — POTASSIUM CHLORIDE CRYS ER 20 MEQ PO TBCR
40.0000 meq | EXTENDED_RELEASE_TABLET | Freq: Once | ORAL | Status: AC
  Administered 2018-08-21: 40 meq via ORAL
  Filled 2018-08-21: qty 2

## 2018-08-21 MED ORDER — SODIUM CHLORIDE 0.9% FLUSH: 3.0000 mL | Freq: Two times a day (BID) | INTRAVENOUS | Status: DC

## 2018-08-21 MED ORDER — SODIUM CHLORIDE 0.9% FLUSH
3.0000 mL | Freq: Two times a day (BID) | INTRAVENOUS | Status: DC
  Administered 2018-08-21: 3 mL via INTRAVENOUS

## 2018-08-21 MED ORDER — ASPIRIN 81 MG PO CHEW
324.0000 mg | CHEWABLE_TABLET | Freq: Once | ORAL | Status: AC
  Administered 2018-08-21: 324 mg via ORAL
  Filled 2018-08-21: qty 4

## 2018-08-21 MED ORDER — HEPARIN (PORCINE) 25000 UT/250ML-% IV SOLN
1200.0000 [IU]/h | INTRAVENOUS | Status: DC
  Administered 2018-08-22 – 2018-08-26 (×4): 1200 [IU]/h via INTRAVENOUS
  Filled 2018-08-21 (×5): qty 250

## 2018-08-21 MED ORDER — HEPARIN SODIUM (PORCINE) 1000 UNIT/ML IJ SOLN
INTRAMUSCULAR | Status: AC
  Filled 2018-08-21: qty 1

## 2018-08-21 MED ORDER — FUROSEMIDE 10 MG/ML IJ SOLN
20.0000 mg | Freq: Once | INTRAMUSCULAR | Status: AC
  Administered 2018-08-21: 20 mg via INTRAVENOUS
  Filled 2018-08-21: qty 2

## 2018-08-21 MED ORDER — VERAPAMIL HCL 2.5 MG/ML IV SOLN
INTRAVENOUS | Status: DC | PRN
  Administered 2018-08-21: 2.5 mg via INTRA_ARTERIAL

## 2018-08-21 MED ORDER — CARVEDILOL 3.125 MG PO TABS: 3.1250 mg | ORAL_TABLET | Freq: Two times a day (BID) | ORAL | Status: DC

## 2018-08-21 MED ORDER — ATORVASTATIN CALCIUM 20 MG PO TABS: 40.0000 mg | ORAL_TABLET | Freq: Every day | ORAL | Status: DC

## 2018-08-21 MED ORDER — HEPARIN (PORCINE) 25000 UT/250ML-% IV SOLN: 14.0000 [IU]/kg/h | Freq: Once | INTRAVENOUS | Status: DC

## 2018-08-21 MED ORDER — SODIUM CHLORIDE 0.9 % IV SOLN
INTRAVENOUS | Status: DC | PRN
  Administered 2018-08-21: 500 mL via INTRAVENOUS

## 2018-08-21 MED ORDER — CARVEDILOL 3.125 MG PO TABS
3.1250 mg | ORAL_TABLET | Freq: Two times a day (BID) | ORAL | Status: DC
  Administered 2018-08-21 – 2018-08-25 (×9): 3.125 mg via ORAL
  Filled 2018-08-21 (×9): qty 1

## 2018-08-21 MED ORDER — CARVEDILOL 3.125 MG PO TABS
3.1250 mg | ORAL_TABLET | Freq: Two times a day (BID) | ORAL | Status: DC
  Filled 2018-08-21: qty 1

## 2018-08-21 MED ORDER — ASPIRIN 81 MG PO CHEW: 81.0000 mg | CHEWABLE_TABLET | ORAL | Status: DC

## 2018-08-21 MED ORDER — HEPARIN BOLUS VIA INFUSION
3500.0000 [IU] | Freq: Once | INTRAVENOUS | Status: AC
  Administered 2018-08-21: 3500 [IU] via INTRAVENOUS
  Filled 2018-08-21: qty 3500

## 2018-08-21 MED ORDER — NITROGLYCERIN 2 % TD OINT
1.0000 [in_us] | TOPICAL_OINTMENT | Freq: Once | TRANSDERMAL | Status: AC
  Administered 2018-08-21: 1 [in_us] via TOPICAL
  Filled 2018-08-21: qty 1

## 2018-08-21 MED ORDER — ALBUTEROL SULFATE (2.5 MG/3ML) 0.083% IN NEBU: 2.5000 mg | INHALATION_SOLUTION | Freq: Four times a day (QID) | RESPIRATORY_TRACT | Status: DC | PRN

## 2018-08-21 MED ORDER — FAMOTIDINE IN NACL 20-0.9 MG/50ML-% IV SOLN
INTRAVENOUS | Status: AC
  Administered 2018-08-21: 20 mg via INTRAVENOUS
  Filled 2018-08-21: qty 50

## 2018-08-21 MED ORDER — LIDOCAINE VISCOUS HCL 2 % MT SOLN
15.0000 mL | Freq: Once | OROMUCOSAL | Status: DC
  Filled 2018-08-21: qty 15

## 2018-08-21 MED ORDER — INSULIN ASPART 100 UNIT/ML ~~LOC~~ SOLN
0.0000 [IU] | Freq: Every day | SUBCUTANEOUS | Status: DC
  Administered 2018-08-21: 3 [IU] via SUBCUTANEOUS
  Administered 2018-08-22: 4 [IU] via SUBCUTANEOUS

## 2018-08-21 MED ORDER — ASPIRIN EC 81 MG PO TBEC: 81.0000 mg | DELAYED_RELEASE_TABLET | Freq: Every day | ORAL | Status: DC

## 2018-08-21 MED ORDER — FAMOTIDINE IN NACL 20-0.9 MG/50ML-% IV SOLN
20.0000 mg | Freq: Once | INTRAVENOUS | Status: AC
  Administered 2018-08-21: 20 mg via INTRAVENOUS

## 2018-08-21 MED ORDER — SODIUM CHLORIDE 0.9 % WEIGHT BASED INFUSION
3.0000 mL/kg/h | INTRAVENOUS | Status: DC
  Administered 2018-08-21: 3 mL/kg/h via INTRAVENOUS

## 2018-08-21 MED ORDER — LIVING WELL WITH DIABETES BOOK
Freq: Once | Status: DC
  Filled 2018-08-21 (×2): qty 1

## 2018-08-21 MED ORDER — IOPAMIDOL (ISOVUE-300) INJECTION 61%
INTRAVENOUS | Status: DC | PRN
  Administered 2018-08-21: 60 mL via INTRA_ARTERIAL

## 2018-08-21 MED ORDER — BISACODYL 5 MG PO TBEC: 5.0000 mg | DELAYED_RELEASE_TABLET | Freq: Every day | ORAL | Status: DC | PRN

## 2018-08-21 MED ORDER — SODIUM CHLORIDE 0.9 % WEIGHT BASED INFUSION: 1.0000 mL/kg/h | INTRAVENOUS | Status: DC

## 2018-08-21 MED ORDER — INFLUENZA VAC SPLIT QUAD 0.5 ML IM SUSY: 0.5000 mL | PREFILLED_SYRINGE | INTRAMUSCULAR | Status: DC

## 2018-08-21 MED ORDER — ATORVASTATIN CALCIUM 20 MG PO TABS: 80.0000 mg | ORAL_TABLET | Freq: Every day | ORAL | Status: DC

## 2018-08-21 MED ORDER — FENTANYL CITRATE (PF) 100 MCG/2ML IJ SOLN
INTRAMUSCULAR | Status: AC
  Filled 2018-08-21: qty 2

## 2018-08-21 MED ORDER — MIDAZOLAM HCL 2 MG/2ML IJ SOLN
INTRAMUSCULAR | Status: AC
  Filled 2018-08-21: qty 2

## 2018-08-21 MED ORDER — ALUM & MAG HYDROXIDE-SIMETH 200-200-20 MG/5ML PO SUSP: 30.0000 mL | Freq: Once | ORAL | Status: DC

## 2018-08-21 MED ORDER — INSULIN ASPART 100 UNIT/ML ~~LOC~~ SOLN
0.0000 [IU] | Freq: Three times a day (TID) | SUBCUTANEOUS | Status: DC
  Filled 2018-08-21: qty 1

## 2018-08-21 SURGICAL SUPPLY — 7 items
CATH INFINITI 5FR ANG PIGTAIL (CATHETERS) ×3 IMPLANT
CATH INFINITI 5FR TG (CATHETERS) ×3 IMPLANT
DEVICE RAD TR BAND REGULAR (VASCULAR PRODUCTS) ×3 IMPLANT
GLIDESHEATH SLEND SS 6F .021 (SHEATH) ×3 IMPLANT
KIT MANI 3VAL PERCEP (MISCELLANEOUS) ×3 IMPLANT
PACK CARDIAC CATH (CUSTOM PROCEDURE TRAY) ×3 IMPLANT
WIRE ROSEN-J .035X260CM (WIRE) ×3 IMPLANT

## 2018-08-21 NOTE — Progress Notes (Signed)
Patient was transferred from the ED following c/o of burning chest discomfort. On admission patient was A&O X4, ambulatory accompanied by her husband, and denied being in pain. Patient and husband were oriented to the room and admission documentation completed. Care plan was reviewed with patient and husband. Patient kept NPO per order and heparin gtt started

## 2018-08-21 NOTE — Progress Notes (Addendum)
Inpatient Diabetes Program Recommendations  AACE/ADA: New Consensus Statement on Inpatient Glycemic Control (2019)  Target Ranges:  Prepandial:   less than 140 mg/dL      Peak postprandial:   less than 180 mg/dL (1-2 hours)      Critically ill patients:  140 - 180 mg/dL  Results for Olivia Roth, Olivia Roth (MRN [id number]) as of 08/21/2018 11:11  Ref. Range 08/21/2018 07:56  Glucose-Capillary Latest Ref Range: 70 - 99 mg/dL 409 (H)   Results for Olivia, Roth (MRN [id number]) as of 08/21/2018 11:11  Ref. Range 08/21/2018 03:40 08/21/2018 03:47 08/21/2018 03:51  Glucose Latest Ref Range: 70 - 99 mg/dL 956 (H) 213 (H) 086 (H)   Results for Olivia, Roth (MRN [id number]) as of 08/21/2018 11:11  Ref. Range 07/05/2016 10:00 07/04/2017 08:44 10/05/2017 09:46 08/21/2018 06:26  Hemoglobin A1C Latest Ref Range: 4.8 - 5.6 % 13.1 (H) 9.8 10.9 8.4 (H)   Review of Glycemic Control  Diabetes history: DM2 Outpatient Diabetes medications: Metformin 1000 mg QAM, Metformin 500 mg QPM (also noted Amaryl 2 mg QAM and Januvia 50 mg daily on home medication list with notation that patient is NOT TAKING) Current orders for Inpatient glycemic control: Novolog 0-15 units TID with meals, Novolog 0-5 units QHS  Inpatient Diabetes Program Recommendations: HgbA1C: A1C 8.4% on 08/21/18 indicating an average glucose of 194 mg/dl over the past 2-3 months.  NOTE: In reviewing chart, noted patient seen PCP on 10/05/17 and it was noted " Pt is currently only taking 1,000 mg metformin.  Continues to have resistance of medications.  Pt cites fear of hypoglycemia and not waking from sleep with diabetes meds and living alone.  Overall, pt also had poor knowledge of disease and appropriate treatment.  Health beliefs strongly guiding desire to avoid medications. INCREASE to metformin 1,000 mg in am and 1,000 mg in pm. START Januvia 50 mg once daily."  Will plan to follow up with patient regarding A1C and importance of DM control. Ordered  Living Well with DM book.  Addendum 08/21/18@12 :25-Spoke with patient regarding DM control and outpatient DM regimen. Patient reports that she is consistently taking Metformin 1000 mg QAM and Metformin 500 mg QPM for DM control. Inquired about Amaryl and Januvia and patient states that she does not take them. Inquired about why she is not taking them and patient stated "because of all the side effects I read about."  Patient denies any fear of hypoglycemia and again stated she just didn't want to take the other DM medications but she is faithfully taking the Metformin BID as noted. Explained that the Metformin alone was not enough to keep glucose well controlled as evidenced by A1C trends.  Patient reports that she does not check her glucose unless she feels "funny" but she notes that she does have a glucometer at home. Discussed A1C results (8.4% on 08/21/18) and explained that her current A1C indicates an average glucose of 194 mg/dl over the past 2-3 months. Discussed glucose and A1C goals which patient was aware of. Discussed importance of checking CBGs and maintaining good CBG control to prevent long-term and short-term complications. Explained how hyperglycemia leads to damage within blood vessels which lead to the common complications seen with uncontrolled diabetes. Stressed to the patient the importance of improving glycemic control to prevent further complications from uncontrolled diabetes. Patient notes that she has been trying to do better with following Carb Modified diet lately.  Discussed Januvia and Amaryl in more detail and how they work  to keep DM controlled.  Asked patient to re-consider taking additional oral DM medications at home to improve DM control. [facility]ed [facility]. Discussed how stress from CABG can increase insulin needs and briefly discussed TCTS protocol for glycemic control.  Stressed how importance glycemic control was following  CABG to decrease risk of complications.  Encouraged patient to start checking her glucose at home and to take DM medications as prescribed by her PCP. Patient verbalized understanding of information discussed and she states that she has no further questions at this time related to diabetes.  Thanks, Orlando PennerMarie Maryana Pittmon, RN, MSN, CDE Diabetes Coordinator Inpatient Diabetes Program [phone] (Team Pager from 8am to 5pm)

## 2018-08-21 NOTE — Progress Notes (Signed)
ANTICOAGULATION CONSULT NOTE  Pharmacy Consult for heparin Indication: chest pain/ACS  Allergies  Allergen Reactions  . Avocado   . Cortisone Other (See Comments)    drowsiness  . Pecan Nut (Diagnostic) Itching  . Shellfish Allergy Hives  . Advair Diskus [Fluticasone-Salmeterol] Palpitations  . Salmon [Fish Allergy] Rash    Rash and itching of ears    Patient Measurements: Height: 5\' 1"  (154.9 cm) Weight: 162 lb 6.4 oz (73.7 kg) IBW/kg (Calculated) : 47.8 Heparin Dosing Weight: 63.7 kg  Vital Signs: Temp: 98.3 F (36.8 C) (11/13 1545) Temp Source: Oral (11/13 1545) BP: 116/78 (11/13 1545) Pulse Rate: 89 (11/13 1710)  Labs: Recent Labs    08/21/18 0340 08/21/18 0347 08/21/18 0351 08/21/18 0441 08/21/18 0626 08/21/18 1344  HGB 14.6 14.6 15.0  --   --   --   HCT 43.7 43.0 44.0  --   --   --   PLT 255  --   --   --   --   --   APTT  --   --   --  30  --   --   LABPROT  --   --   --  13.3  --   --   INR  --   --   --  1.02  --   --   CREATININE 0.81 0.80 0.80  --   --   --   TROPONINI 0.14*  --   --   --  0.29* 2.63*    Estimated Creatinine Clearance: 66.1 mL/min (by C-G formula based on SCr of 0.8 mg/dL).   Medical History: Past Medical History:  Diagnosis Date  . Anxiety   . Asthma    In cold weather (08/21/2018)  . Family history of adverse reaction to anesthesia    "mother PONV; talked crazy/loud after anesthesia" (08/21/2018)  . GERD (gastroesophageal reflux disease)   . High cholesterol   . NSTEMI (non-ST elevated myocardial infarction) (HCC) 08/20/2018  . Seasonal allergies   . Tuberculosis ~ 1960  . Type II diabetes mellitus (HCC)   . UTI (lower urinary tract infection)    "once; before hysterectomy" (08/21/2018)    Assessment: 62 year old female s/p cath showing severe three-vessel coronary artery disease and reduced left ventricular systolic function. Plan [name] CABG.  TR band deflated at  approximately 1140 - heparin infusion restarted 1345 at rate of 750 units/hr. Heparin level this evening came back at undetectable. Hgb 15, plt 255. No s/sx of bleeding. No infusion issues per nursing.   Goal of Therapy:  Heparin level 0.3-0.7 units/ml Monitor platelets by anticoagulation protocol: Yes   Plan:  Increase heparin infusion to 950 units/hr Obtain heparin level in 6 hours Monitor daily HL, CBC, and for s/sx of bleeding  Girard CooterKimberly Perkins, PharmD Clinical Pharmacist  Pager: [phone] Phone: [phone] 08/21/2018 6:36 PM

## 2018-08-21 NOTE — Plan of Care (Signed)
  Problem: Health Behavior/Discharge Planning: Goal: Ability to manage health-related needs will improve Outcome: Progressing Note:  Patient to transfer to [facility] for further treatment with a C.A.B.G., hopefully later in shift. Bed acceptance pending approval. Remains on a Heparin drip at 7.5 / hr. Patient complains of 5/10 throat pain, but doesn't want any PRN pain medications (including IV Morphine). Jari FavreSteven M [facility]

## 2018-08-21 NOTE — Discharge Summary (Signed)
[place] [facility] [place] NAME: Olivia Roth    MR#:  [id number]  DATE OF BIRTH:  12-17-1955  DATE OF ADMISSION:  08/21/2018 ADMITTING PHYSICIAN: Arta Silence, MD  DATE OF DISCHARGE: 08/21/2018  PRIMARY CARE PHYSICIAN: Mikey College, NP   ADMISSION DIAGNOSIS:  NSTEMI (non-ST elevated myocardial infarction) ([place]) [I21.4]  DISCHARGE DIAGNOSIS:  Active Problems:   Chest pain   NSTEMI (non-ST elevated myocardial infarction) ([place])   SECONDARY DIAGNOSIS:   Past Medical History:  Diagnosis Date  . Allergy    Seasonal allergies  . Asthma    In cold weather  . Diabetes mellitus without complication ([place])   . UTI (lower urinary tract infection)      ADMITTING HISTORY  HISTORY OF PRESENT ILLNESS:  Olivia Roth  is a 62 y.o. female with a known history of obesity, T2NIDDM p/w anterior neck pain. Pt states that she was in bed on Sunday (11/10) night, and was getting ready to go to sleep, when she developed a burning sensation in the neck/throat (from the base of the neck to the chin, midline). She states she had N/V x1. She thought she was having heartburn/indigestion/reflux. She drank milk, belched several times and felt better. She felt well until Tuesday (11/12). She drank coffee @~1800PM, and developed recurrent burning in the neck/throat, of character and severity comparable to that which she had experienced prior. She stats she ate a cracker with mustard, which she states improved her symptoms. She ate cereal later that evening (@~2045PM). She went to bed asymptomatic. She was woken up out of sleep @~0300AM (Wed 11/13) w/ recurrent burning throat/neck discomfort. She states, however, the discomfort was different this time, and presented as frank pain, sharp and severe. She became concerned she was having a heart attack, woke her husband and had him drive her to the ED. Denies frank CP, SOB, palpitations, diaphoresis, LH/LOC. EKG (+) ST  depressions (I + aVL), (-) ST elevations. Trop-I 0.14. (-) Dx HTN, (-) Dx HLD, (-) smoker, (-) FHx CAD/MI; (+) obesity, (+) DM. Anxious at the time of my assessment, but appears reasonably comfortable and is in no distress.   HOSPITAL COURSE:   * NSTEMI * HTN * DM * Hyperlipidemia  Patient admitted to telemetry floor on heparin and started on ASA, Statin. Troponin slowly trended up. Cardiology consulted and patient had cardiac catheterization on 08/21/2018 by Dr. Saunders Revel.  Found to have three-vessel disease and patient will be transferred to [facility] for CABG.  Case was discussed by Dr. Saunders Revel with [facility] Pont[facility] Dr. Peter Martinique will be the accepting physician. Patient will be transferred once bed is available.   CONSULTS OBTAINED:  Treatment Team:  Arta Silence, MD End, Harrell Gave, MD  DRUG ALLERGIES:   Allergies  Allergen Reactions  . Avocado   . Cortisone Other (See Comments)    drowsiness  . Pecan Nut (Diagnostic) Itching  . Shellfish Allergy Hives  . Advair Diskus [Fluticasone-Salmeterol] Palpitations  . Salmon [Fish Allergy] Rash    Rash and itching of ears    DISCHARGE MEDICATIONS:   Allergies as of 08/21/2018      Reactions   Avocado    Cortisone Other (See Comments)   drowsiness   Pecan Nut (diagnostic) Itching   Shellfish Allergy Hives   Advair Diskus [fluticasone-salmeterol] Palpitations   Salmon [fish Allergy] Rash   Rash and itching of ears      Medication List    TAKE these medications  albuterol 108 (90 Base) MCG/ACT inhaler Commonly known as:  PROVENTIL HFA;VENTOLIN HFA Inhale 2 puffs into the lungs every 6 (six) hours as needed for wheezing or shortness of breath.   blood glucose meter kit and supplies Kit Dispense based on patient and insurance preference. Use up to four times daily as directed. For E11.65 Check blood glucose 3 times weekly on MWF   Cinnamon 500 MG Tabs Take by mouth.   glimepiride 2 MG tablet Commonly known  as:  AMARYL Take 1 tablet (2 mg total) by mouth daily before breakfast.   ipratropium 0.06 % nasal spray Commonly known as:  ATROVENT Place 2 sprays into both nostrils 4 (four) times daily.   metFORMIN 500 MG tablet Commonly known as:  GLUCOPHAGE TAKE 2 TABLETS BY MOUTH ONCE EVERY MORNING AND 1 TABLET ONCE EVERY EVENING   sitaGLIPtin 50 MG tablet Commonly known as:  JANUVIA Take 1 tablet (50 mg total) by mouth daily.       Today   VITAL SIGNS:  Blood pressure 134/80, pulse 84, temperature 98.3 F (36.8 C), temperature source Oral, resp. rate 15, height '5\' 1"'  (1.549 m), weight 75.5 kg, SpO2 96 %.  I/O:    Intake/Output Summary (Last 24 hours) at 08/21/2018 1210 Last data filed at 08/21/2018 0606 Gross per 24 hour  Intake 35 ml  Output 0 ml  Net 35 ml    PHYSICAL EXAMINATION:  Physical Exam  GENERAL:  62 y.o.-year-old patient lying in the bed with no acute distress.  LUNGS: Normal breath sounds bilaterally, no wheezing, rales,rhonchi or crepitation. No use of accessory muscles of respiration.  CARDIOVASCULAR: S1, S2 normal. No murmurs, rubs, or gallops.  ABDOMEN: Soft, non-tender, non-distended. Bowel sounds present. No organomegaly or mass.  NEUROLOGIC: Moves all 4 extremities. PSYCHIATRIC: The patient is alert and oriented x 3.  SKIN: No obvious rash, lesion, or ulcer.   DATA REVIEW:   CBC Recent Labs  Lab 08/21/18 0340  08/21/18 0351  WBC 8.4  --   --   HGB 14.6   < > 15.0  HCT 43.7   < > 44.0  PLT 255  --   --    < > = values in this interval not displayed.    Chemistries  Recent Labs  Lab 08/21/18 0340  08/21/18 0351 08/21/18 0626  NA 141   < > 139  --   K 3.5   < > 3.9  --   CL 106   < > 106  --   CO2 23  --   --   --   GLUCOSE 234*   < > 242*  --   BUN 17   < > 19  --   CREATININE 0.81   < > 0.80  --   CALCIUM 9.0  --   --   --   MG  --   --   --  1.9  AST 24  --   --   --   ALT 19  --   --   --   ALKPHOS 75  --   --   --   BILITOT  0.9  --   --   --    < > = values in this interval not displayed.    Cardiac Enzymes Recent Labs  Lab 08/21/18 0626  TROPONINI 0.29*    Microbiology Results  No results found for this or any previous visit.  RADIOLOGY:  Dg Chest Portable 1 View  Result Date: 08/21/2018 CLINICAL DATA:  Acute onset of sudden chest burning sensation. EXAM: PORTABLE CHEST 1 VIEW COMPARISON:  Chest radiograph performed 11/20/2007 FINDINGS: The lungs are well-aerated. Mild vascular congestion is noted. Peribronchial thickening is seen. There is no evidence of focal opacification, pleural effusion or pneumothorax. The cardiomediastinal silhouette is within normal limits. No acute osseous abnormalities are seen. IMPRESSION: Mild vascular congestion noted. Peribronchial thickening seen. Electronically Signed   By: Garald Balding M.D.   On: 08/21/2018 03:52    Follow up with PCP in 1 week.  Management plans discussed with the patient, family and they are in agreement.  CODE STATUS:     Code Status Orders  (From admission, onward)         Start     Ordered   08/21/18 0532  Full code  Continuous     08/21/18 0532        Code Status History    This patient has a current code status but no historical code status.      TOTAL TIME TAKING CARE OF THIS PATIENT ON DAY OF DISCHARGE: more than 30 minutes.   Neita Carp M.D on 08/21/2018 at 12:10 PM  Between 7am to 6pm - Pager - [phone]  After 6pm go to [email or web address] - password [email or web address] [place] [facility]  Office  [phone]  CC: Primary care physician; Mikey College, NP  Note: This dictation was prepared with Dragon dictation along with smaller phrase technology. Any transcriptional errors that result from this process are unintentional.

## 2018-08-21 NOTE — Progress Notes (Signed)
Spoke to [facility] via telephone and in person regarding transfer report. They are here to transport patient now. Jari FavreSteven M Mory Herrman Called [facility] and gave nurse-to-nurse report to receiving R.N. All questions answered. Finishing EMTALA form now. Will continue to monitor. Jari FavreSteven M [facility]

## 2018-08-21 NOTE — H&P (Addendum)
Cardiology Admission History and Physical:   Patient ID: Olivia Roth MRN: [id number]; DOB: May 17, 1956   Admission date: 08/21/2018  Primary Care Provider: Mikey College, NP Primary Cardiologist: New - Shanvi Moyd Primary Electrophysiologist:  None   Chief Complaint:  Throat pain  Patient Profile:   Olivia Roth is a 62 y.o. female with history of type 2 diabetes mellitus and obesity, who presented to [facility] this morning with throat pain.  History of Present Illness:   Olivia Roth was in her usual state of health until 3 days ago, when she developed burning in her throat after eating spaghetti and drinking sangria.  She thought it was indigestion, the pain subsequently resolving after about 2 hours.  She otherwise felt well up until 3 this morning, when she awoke with more severe burning in her throat (10/10).  She noted mild accompanying dizziness and nausea but no shortness of breath or palpitations.  Due to severity of the pain, she presented to the emergency department, where she received aspirin and a GI cocktail with prompt resolution of the pain.  She is currently asymptomatic.  She has not had any exertional chest pain.  She notes some chronic dyspnea on exertion when walking up stairs quickly.  She denies a history of prior heart disease.  She has not had orthopnea, PND, or edema.  She notes that her medical follow-up has been intermittent with suboptimal control of her diabetes at times.  She is compliant with her metformin, which is the only medication that she takes regularly.  Of note, her last PCP visit was almost a year ago, which time her hemoglobin A1c was 10.9.  She was advised to increase metformin and start Januvia.  Olivia Roth underwent left heart catheterization this morning at [facility], which demonstrated severe three-vessel coronary artery disease as well as moderately to severely reduced left ventricular systolic function and moderately elevated left ventricular filling  pressure.  She has been transferred to [facility] for surgical evaluation for CABG.   Past Medical History:  Diagnosis Date  . Anxiety   . Asthma    In cold weather (08/21/2018)  . Family history of adverse reaction to anesthesia    "mother PONV; talked crazy/loud after anesthesia" (08/21/2018)  . GERD (gastroesophageal reflux disease)   . High cholesterol   . NSTEMI (non-ST elevated myocardial infarction) ([place]) 08/20/2018  . Seasonal allergies   . Tuberculosis ~ 1960  . Type II diabetes mellitus ([place])   . UTI (lower urinary tract infection)    "once; before hysterectomy" (08/21/2018)    Past Surgical History:  Procedure Laterality Date  . GANGLION CYST EXCISION Left ~ 1960   neck  . LEFT HEART CATH AND CORONARY ANGIOGRAPHY N/A 08/21/2018   Procedure: LEFT HEART CATH AND CORONARY ANGIOGRAPHY;  Surgeon: Nelva Bush, MD;  Location: [place] [facility];  Service: Cardiovascular;  Laterality: N/A;  . TOTAL ABDOMINAL HYSTERECTOMY  2006     Medications Prior to Admission: Prior to Admission medications   Medication Sig Start Date Olivia Roth Date Taking? Authorizing Provider  albuterol (PROVENTIL HFA;VENTOLIN HFA) 108 (90 Base) MCG/ACT inhaler Inhale 2 puffs into the lungs every 6 (six) hours as needed for wheezing or shortness of breath. 09/10/17   Mikey College, NP  blood glucose meter kit and supplies KIT Dispense based on patient and insurance preference. Use up to four times daily as directed. For E11.65 Check blood glucose 3 times weekly on MWF 07/20/16   Luciana Axe, NP  Cinnamon 500 MG TABS Take by mouth.    [provider]  glimepiride (AMARYL) 2 MG tablet Take 1 tablet (2 mg total) by mouth daily before breakfast. Patient not taking: Reported on 10/05/2017 07/20/16   Luciana Axe, NP  ipratropium (ATROVENT) 0.06 % nasal spray Place 2 sprays into both nostrils 4 (four) times daily. Patient not taking: Reported on 10/05/2017 09/10/17   Mikey College, NP  metFORMIN (GLUCOPHAGE) 500 MG tablet TAKE 2 TABLETS BY MOUTH ONCE EVERY MORNING AND 1 TABLET ONCE EVERY EVENING 08/12/18   Mikey College, NP  sitaGLIPtin (JANUVIA) 50 MG tablet Take 1 tablet (50 mg total) by mouth daily. Patient not taking: Reported on 08/21/2018 10/05/17   Mikey College, NP     Allergies:    Allergies  Allergen Reactions  . Avocado   . Cortisone Other (See Comments)    drowsiness  . Pecan Nut (Diagnostic) Itching  . Shellfish Allergy Hives  . Advair Diskus [Fluticasone-Salmeterol] Palpitations  . Dani Gobble [Fish Allergy] Rash    Rash and itching of ears    Social History:   Social History   Tobacco Use  . Smoking status: Never Smoker  . Smokeless tobacco: Never Used  Substance Use Topics  . Alcohol use: Yes    Comment: 08/21/2018 "glass of sangria or a beer 3-4 times/year"  . Drug use: Never    Family History:  The patient's family history includes Cancer in her sister. There is no history of Heart disease.    ROS:  Please see the history of present illness.  All other ROS reviewed and negative.     Physical Exam/Data:  Temperature 98.3, pulse 84, respirations 15, blood pressure 134/80, oxygen saturation 96% on room air  General:  Well nourished, well developed, in no acute distress. HEENT: normal Lymph: no adenopathy Neck: no JVD Endocrine:  No thryomegaly Vascular: No carotid bruits; radial and pedal pulses 2+. Cardiac:  normal S1, S2; RRR; no murmurs or rubs, or gallops. Lungs:  clear to auscultation bilaterally, no wheezing, rhonchi or rales  Abd: soft, nontender, no hepatomegaly  Ext: no lower extremity edema Musculoskeletal:  No deformities, BUE and BLE strength normal and equal Skin: warm and dry  Neuro:  CNs 2-12 intact, no focal abnormalities noted Psych:  Normal affect    EKG:  The ECG that was done at 3:37 AM was personally reviewed and demonstrates sinus tachycardia with lateral ST depression and T wave  inversions.  Relevant CV Studies: LHC (08/21/18): 1. Severe three-vessel coronary artery disease, including 50% distal LMCA, 80% ostial/proximal LAD, and 90% ostial LCx, and 99% mid RCA stenoses. 2. Moderately to severely reduced left ventricular systolic function (LVEF 71-24%) with global hypokinesis and mid/apical akinesis. 3. Moderately elevated left ventricular filling pressure.  Diagnostic Diagram      Laboratory Data:  Chemistry Recent Labs  Lab 08/21/18 0340 08/21/18 0347 08/21/18 0351  NA 141 139 139  K 3.5 3.9 3.9  CL 106 107 106  CO2 23  --   --   GLUCOSE 234* 238* 242*  BUN '17 19 19  '$ CREATININE 0.81 0.80 0.80  CALCIUM 9.0  --   --   GFRNONAA >60  --   --   GFRAA >60  --   --   ANIONGAP 12  --   --     Recent Labs  Lab 08/21/18 0340  PROT 7.5  ALBUMIN 4.2  AST 24  ALT 19  ALKPHOS 75  BILITOT 0.9   Hematology Recent Labs  Lab 08/21/18 0340 08/21/18 0347 08/21/18 0351  WBC 8.4  --   --   RBC 4.85  --   --   HGB 14.6 14.6 15.0  HCT 43.7 43.0 44.0  MCV 90.1  --   --   MCH 30.1  --   --   MCHC 33.4  --   --   RDW 12.7  --   --   PLT 255  --   --    Cardiac Enzymes Recent Labs  Lab 08/21/18 0340 08/21/18 0626 08/21/18 1344  TROPONINI 0.14* 0.29* 2.63*   No results for input(s): TROPIPOC in the last 168 hours.  BNP Recent Labs  Lab 08/21/18 0340  BNP 97.0    DDimer No results for input(s): DDIMER in the last 168 hours.  Radiology/Studies:  Dg Chest Portable 1 View  Result Date: 08/21/2018 CLINICAL DATA:  Acute onset of sudden chest burning sensation. EXAM: PORTABLE CHEST 1 VIEW COMPARISON:  Chest radiograph performed 11/20/2007 FINDINGS: The lungs are well-aerated. Mild vascular congestion is noted. Peribronchial thickening is seen. There is no evidence of focal opacification, pleural effusion or pneumothorax. The cardiomediastinal silhouette is within normal limits. No acute osseous abnormalities are seen. IMPRESSION: Mild vascular  congestion noted. Peribronchial thickening seen. Electronically Signed   By: Garald Balding M.D.   On: 08/21/2018 03:52    Assessment and Plan:   NSTEMI Patient currently asymptomatic.  Catheterization today revealed severe three-vessel coronary artery disease.  Though her distal targets are not ideal, I feel that her best revascularization option is CABG.  Patient has been transferred from [facility] for surgical consultation.  Continue heparin infusion and aspirin 81 mg daily.  Anticipate starting clopidogrel following surgical consultation/CABG, to complete at least 12 months of DAPT.  High intensity statin therapy.  Obtain transthoracic echocardiogram.  Ischemic cardiomyopathy Moderately to severely reduced LVEF noted by left ventriculogram with moderately elevated left ventricular filling pressure.  Furosemide 20 mg IV given x1 at [facility].  Continue gentle diuresis for goal net negative fluid balance of 1 to 2 L in 24 hours.  Continue carvedilol 3.125 mg twice daily.  Defer adding ACE inhibitor or ARB for the time being.  Obtain transthoracic echocardiogram.  Hyperlipidemia LDL less than 70.  Atorvastatin 80 mg daily.  Type 2 diabetes mellitus Uncontrolled with hemoglobin A1c greater than 8.  Hold metformin.  Findings scale insulin.  Severity of Illness: The appropriate patient status for this patient is INPATIENT. Inpatient status is judged to be reasonable and necessary in order to provide the required intensity of service to ensure the patient's safety. The patient's presenting symptoms, physical exam findings, and initial radiographic and laboratory data in the context of their chronic comorbidities is felt to place them at high risk for further clinical deterioration. Furthermore, it is not anticipated that the patient will be medically stable for discharge from the hospital within 2 midnights of admission. The following factors support the patient status of inpatient.   " The  patient's presenting symptoms include atypical chest pain. " The initial radiographic and laboratory data are worrisome because of ischemic EKG changes and cardiac catheterization demonstrating three-vessel CAD.. " The chronic co-morbidities include uncontrolled type 2 diabetes mellitus and obesity.  * I certify that at the point of admission it is my clinical judgment that the patient will require inpatient hospital care spanning beyond 2 midnights from the point of admission due to high intensity of service, high risk  for further deterioration and high frequency of surveillance required.*    For questions or updates, please contact [place] Please consult [email or web address] for contact info under   Signed, Nelva Bush, MD  08/21/2018 6:21 PM

## 2018-08-21 NOTE — ED Notes (Signed)
Trop is 0.14. Dr. Dolores FrameSung made aware.

## 2018-08-21 NOTE — Interval H&P Note (Signed)
History and Physical Interval Note:  08/21/2018 9:29 AM  Olivia Roth  has presented today for cardiac catheterization, with the diagnosis of NSTEMI. The various methods of treatment have been discussed with the patient and family. After consideration of risks, benefits and other options for treatment, the patient has consented to  Procedure(s): LEFT HEART CATH AND CORONARY ANGIOGRAPHY (N/A) as a surgical intervention .  The patient's history has been reviewed, patient examined, no change in status, stable for surgery.  I have reviewed the patient's chart and labs.  Questions were answered to the patient's satisfaction.    Cath Lab Visit (complete for each Cath Lab visit)  Clinical Evaluation Leading to the Procedure:   ACS: Yes.    Non-ACS:  N/A  Tela Kotecki

## 2018-08-21 NOTE — Progress Notes (Signed)
ANTICOAGULATION CONSULT NOTE  Pharmacy Consult for heparin Indication: chest pain/ACS  Allergies  Allergen Reactions  . Avocado   . Cortisone Other (See Comments)    drowsiness  . Pecan Nut (Diagnostic) Itching  . Shellfish Allergy Hives  . Advair Diskus [Fluticasone-Salmeterol] Palpitations  . Salmon [Fish Allergy] Rash    Rash and itching of ears    Patient Measurements: Height: 5\' 1"  (154.9 cm) Weight: 166 lb 8 oz (75.5 kg) IBW/kg (Calculated) : 47.8 Heparin Dosing Weight: 63.7 kg  Vital Signs: Temp: 98.3 F (36.8 C) (11/13 0817) Temp Source: Oral (11/13 0817) BP: 146/78 (11/13 1100) Pulse Rate: 87 (11/13 1100)  Labs: Recent Labs    08/21/18 0340 08/21/18 0347 08/21/18 0351 08/21/18 0441 08/21/18 0626  HGB 14.6 14.6 15.0  --   --   HCT 43.7 43.0 44.0  --   --   PLT 255  --   --   --   --   APTT  --   --   --  30  --   LABPROT  --   --   --  13.3  --   INR  --   --   --  1.02  --   CREATININE 0.81 0.80 0.80  --   --   TROPONINI 0.14*  --   --   --  0.29*    Estimated Creatinine Clearance: 66.9 mL/min (by C-G formula based on SCr of 0.8 mg/dL).   Medical History: Past Medical History:  Diagnosis Date  . Allergy    Seasonal allergies  . Asthma    In cold weather  . Diabetes mellitus without complication (HCC)   . UTI (lower urinary tract infection)     Assessment: 62 year old female s/p cath showing severe three-vessel coronary artery disease and reduced left ventricular systolic function. Plan to transfer to [name] for surgical consultation for CABG. Pharmacy consulted for heparin drip to start 2 hours after TR band deflation.  Goal of Therapy:  Heparin level 0.3-0.7 units/ml Monitor platelets by anticoagulation protocol: Yes   Plan:  Per RN, TR band deflation to be completed at approximately 1140, unless otherwise notified. Will order heparin drip at 750 units/hr to start today at 1345. Heparin level ordered for 2000 if patient stays here  while awaiting transfer to [facility]. CBC with morning labs.  Olivia RiffleAbby K Kirtan Roth, PharmD Pharmacy Resident  08/21/2018 11:19 AM

## 2018-08-21 NOTE — Progress Notes (Signed)
Patient transferred from [facility]. Received a single dose of nitro during transport. Chest pain free on arrival. Orders placed. Stable at this time. Plan for CT surgery eval given triple vessel disease seen on cath  SignedAzalee Course, Jatavious Peppard PA Pager: [phone]

## 2018-08-21 NOTE — Progress Notes (Signed)
ANTICOAGULATION CONSULT NOTE - Initial Consult  Pharmacy Consult for heparin drip Indication: chest pain/ACS  Allergies  Allergen Reactions  . Avocado   . Cortisone Other (See Comments)    drowsiness  . Pecan Nut (Diagnostic) Itching  . Shellfish Allergy Hives  . Advair Diskus [Fluticasone-Salmeterol] Palpitations  . Salmon [Fish Allergy] Rash    Rash and itching of ears    Patient Measurements: Height: 5\' 1"  (154.9 cm) Weight: 161 lb (73 kg) IBW/kg (Calculated) : 47.8 Heparin Dosing Weight: 58kg  Vital Signs: Temp: 97.4 F (36.3 C) (11/13 0337) Temp Source: Oral (11/13 0337) Pulse Rate: 109 (11/13 0337)  Labs: Recent Labs    08/21/18 0340  HGB 14.6  HCT 43.7  PLT 255  CREATININE 0.81  TROPONINI 0.14*    Estimated Creatinine Clearance: 65 mL/min (by C-G formula based on SCr of 0.81 mg/dL).   Medical History: Past Medical History:  Diagnosis Date  . Allergy    Seasonal allergies  . Asthma    In cold weather  . Diabetes mellitus without complication (HCC)   . UTI (lower urinary tract infection)     Medications:  No anticoag in PTA meds  Assessment: Trop 0.14  Goal of Therapy:  Heparin level 0.3-0.7 units/ml Monitor platelets by anticoagulation protocol: Yes   Plan:  3500 unit bolus and initial rate of 700 units/hr. First heparin level 6 hours after start of infusion.  Chundra Sauerwein S 08/21/2018,4:47 AM

## 2018-08-21 NOTE — H&P (Signed)
[place] [facility] [place] NAME: Olivia Roth    MR#:  [id number]  DATE OF BIRTH:  [birth year]  DATE OF ADMISSION:  08/21/2018  PRIMARY CARE PHYSICIAN: Mikey College, NP   REQUESTING/REFERRING PHYSICIAN: Paulette Blanch, MD  CHIEF COMPLAINT:   Chief Complaint  Patient presents with  . Chest Pain    HISTORY OF PRESENT ILLNESS:  Olivia Roth  is a 62 y.o. female with a known history of obesity, T2NIDDM p/w anterior neck pain. Pt states that she was in bed on Sunday (11/10) night, and was getting ready to go to sleep, when she developed a burning sensation in the neck/throat (from the base of the neck to the chin, midline). She states she had N/V x1. She thought she was having heartburn/indigestion/reflux. She drank milk, belched several times and felt better. She felt well until Tuesday (11/12). She drank coffee @~1800PM, and developed recurrent burning in the neck/throat, of character and severity comparable to that which she had experienced prior. She stats she ate a cracker with mustard, which she states improved her symptoms. She ate cereal later that evening (@~2045PM). She went to bed asymptomatic. She was woken up out of sleep @~0300AM (Wed 11/13) w/ recurrent burning throat/neck discomfort. She states, however, the discomfort was different this time, and presented as frank pain, sharp and severe. She became concerned she was having a heart attack, woke her husband and had him drive her to the ED. Denies frank CP, SOB, palpitations, diaphoresis, LH/LOC. EKG (+) ST depressions (I + aVL), (-) ST elevations. Trop-I 0.14. (-) Dx HTN, (-) Dx HLD, (-) smoker, (-) FHx CAD/MI; (+) obesity, (+) DM. Anxious at the time of my assessment, but appears reasonably comfortable and is in no distress.  PAST MEDICAL HISTORY:   Past Medical History:  Diagnosis Date  . Allergy    Seasonal allergies  . Asthma    In cold weather  . Diabetes mellitus without  complication ([place])   . UTI (lower urinary tract infection)     PAST SURGICAL HISTORY:   Past Surgical History:  Procedure Laterality Date  . ABDOMINAL HYSTERECTOMY  2006  . salpingooophrectomy Bilateral 2006    SOCIAL HISTORY:   Social History   Tobacco Use  . Smoking status: Never Smoker  . Smokeless tobacco: Never Used  Substance Use Topics  . Alcohol use: Yes    Comment: occasional use    FAMILY HISTORY:  History reviewed. No pertinent family history.  DRUG ALLERGIES:   Allergies  Allergen Reactions  . Avocado   . Cortisone Other (See Comments)    drowsiness  . Pecan Nut (Diagnostic) Itching  . Shellfish Allergy Hives  . Advair Diskus [Fluticasone-Salmeterol] Palpitations  . Salmon [Fish Allergy] Rash    Rash and itching of ears    REVIEW OF SYSTEMS:   Review of Systems  Constitutional: Negative for chills, diaphoresis, fever, malaise/fatigue and weight loss.  HENT: Negative for congestion, ear pain, hearing loss, nosebleeds, sinus pain, sore throat and tinnitus.   Eyes: Negative for blurred vision, double vision and photophobia.  Respiratory: Negative for cough, hemoptysis, sputum production, shortness of breath and wheezing.   Cardiovascular: Negative for chest pain, palpitations, orthopnea, claudication, leg swelling and PND.  Gastrointestinal: Positive for heartburn, nausea and vomiting. Negative for abdominal pain, blood in stool, constipation, diarrhea and melena.  Genitourinary: Negative for dysuria, flank pain, frequency, hematuria and urgency.  Musculoskeletal: Positive for neck pain. Negative for back  pain, falls, joint pain and myalgias.  Skin: Negative for itching and rash.  Neurological: Negative for dizziness, tingling, tremors, sensory change, speech change, focal weakness, seizures, loss of consciousness, weakness and headaches.  Psychiatric/Behavioral: Negative for memory loss. The patient is nervous/anxious. The patient does not have  insomnia.    MEDICATIONS AT HOME:   Prior to Admission medications   Medication Sig Start Date End Date Taking? Authorizing Provider  albuterol (PROVENTIL HFA;VENTOLIN HFA) 108 (90 Base) MCG/ACT inhaler Inhale 2 puffs into the lungs every 6 (six) hours as needed for wheezing or shortness of breath. 09/10/17  Yes Mikey College, NP  metFORMIN (GLUCOPHAGE) 500 MG tablet TAKE 2 TABLETS BY MOUTH ONCE EVERY MORNING AND 1 TABLET ONCE EVERY EVENING 08/12/18  Yes Mikey College, NP  blood glucose meter kit and supplies KIT Dispense based on patient and insurance preference. Use up to four times daily as directed. For E11.65 Check blood glucose 3 times weekly on MWF 07/20/16   Krebs, Amy Lauren, NP  Cinnamon 500 MG TABS Take by mouth.    [provider]  glimepiride (AMARYL) 2 MG tablet Take 1 tablet (2 mg total) by mouth daily before breakfast. Patient not taking: Reported on 10/05/2017 07/20/16   Luciana Axe, NP  ipratropium (ATROVENT) 0.06 % nasal spray Place 2 sprays into both nostrils 4 (four) times daily. Patient not taking: Reported on 10/05/2017 09/10/17   Mikey College, NP  sitaGLIPtin (JANUVIA) 50 MG tablet Take 1 tablet (50 mg total) by mouth daily. Patient not taking: Reported on 08/21/2018 10/05/17   Mikey College, NP      VITAL SIGNS:  Blood pressure 140/87, pulse (!) 102, temperature 98.3 F (36.8 C), temperature source Oral, resp. rate 18, height 5' 1" (1.549 m), weight 75.5 kg, SpO2 97 %.  PHYSICAL EXAMINATION:  Physical Exam  Constitutional: She is oriented to person, place, and time. She appears well-developed and well-nourished. She is active and cooperative.  Non-toxic appearance. She does not have a sickly appearance. She does not appear ill. No distress. She is not intubated.  HENT:  Head: Normocephalic and atraumatic.  Mouth/Throat: Oropharynx is clear and moist. No oropharyngeal exudate.  Eyes: Conjunctivae, EOM and lids are  normal. No scleral icterus.  Neck: Neck supple. No JVD present. No thyromegaly present.  Cardiovascular: Regular rhythm, S1 normal and S2 normal.  No extrasystoles are present. Tachycardia present. Exam reveals no gallop, no S3, no S4, no distant heart sounds and no friction rub.  No murmur heard. Pulmonary/Chest: Effort normal and breath sounds normal. No accessory muscle usage or stridor. No apnea, no tachypnea and no bradypnea. She is not intubated. No respiratory distress. She has no decreased breath sounds. She has no wheezes. She has no rhonchi. She has no rales.  Abdominal: Soft. Bowel sounds are normal. She exhibits no distension. There is no tenderness. There is no rigidity, no rebound and no guarding.  Musculoskeletal: Normal range of motion.       Right lower leg: Normal. She exhibits no tenderness and no edema.       Left lower leg: Normal. She exhibits no tenderness and no edema.  Lymphadenopathy:    She has no cervical adenopathy.  Neurological: She is alert and oriented to person, place, and time. She is not disoriented.  Skin: Skin is warm, dry and intact. No rash noted. She is not diaphoretic. No erythema. No pallor.  Psychiatric: Her speech is normal and behavior is normal. Judgment  and thought content normal. Her mood appears anxious. She is not agitated. Cognition and memory are normal.   LABORATORY PANEL:   CBC Recent Labs  Lab 08/21/18 0340  WBC 8.4  HGB 14.6  HCT 43.7  PLT 255   ------------------------------------------------------------------------------------------------------------------  Chemistries  Recent Labs  Lab 08/21/18 0340  NA 141  K 3.5  CL 106  CO2 23  GLUCOSE 234*  BUN 17  CREATININE 0.81  CALCIUM 9.0  AST 24  ALT 19  ALKPHOS 75  BILITOT 0.9   ------------------------------------------------------------------------------------------------------------------  Cardiac Enzymes Recent Labs  Lab 08/21/18 0340  TROPONINI 0.14*    ------------------------------------------------------------------------------------------------------------------  RADIOLOGY:  Dg Chest Portable 1 View  Result Date: 08/21/2018 CLINICAL DATA:  Acute onset of sudden chest burning sensation. EXAM: PORTABLE CHEST 1 VIEW COMPARISON:  Chest radiograph performed 11/20/2007 FINDINGS: The lungs are well-aerated. Mild vascular congestion is noted. Peribronchial thickening is seen. There is no evidence of focal opacification, pleural effusion or pneumothorax. The cardiomediastinal silhouette is within normal limits. No acute osseous abnormalities are seen. IMPRESSION: Mild vascular congestion noted. Peribronchial thickening seen. Electronically Signed   By: Garald Balding M.D.   On: 08/21/2018 03:52   IMPRESSION AND PLAN:   A/P: 81N w/ DM p/w anterior burning neck pain, Trop-I 0.14; presentation concerning for ACS/NSTEMI. Hyperglycemia (w/ T2NIDDM), -Neck pain, Trop-I elevation: Pt p/w recurrent neck pain since Sunday 11/10. (+) N/V, (-) frank CP, SOB, palpitations, diaphoresis, LH/LOC. (+) obesity, (+) DM; however (-) Dx HTN, (-) Dx HLD, (-) smoker, (-) FHx CAD/MI. EKG (+) ST depressions (I + aVL), (-) ST elevations. Trop-I 0.14. CXR w/ mild vascular congestion, (-) SOB, lungs clear, BNP low. Presentation concerning for ACS/NSTEMI. Tele, continuous cardiac monitoring. Rpt Trop-I pending. ASA, heparin gtt. Lipid panel, HbA1c. Statin, beta blocker. Cardiology consult. -Hyperglycemia, T2NIDDM: SSI. Hold home antihyperglycemic agents. -c/w other home meds/formulary subs. -FEN/GI: NPO for now. -DVT PPx: Heparin. -Code status: Full code. -Disposition: Admission, > 2 midnights.   All the records are reviewed and case discussed with ED provider. Management plans discussed with the patient, family and they are in agreement.  CODE STATUS: Full code.  TOTAL TIME TAKING CARE OF THIS PATIENT: 75 minutes.    Arta Silence M.D on 08/21/2018 at 5:59  AM  Between 7am to 6pm - Pager - [phone]  After 6pm go to [email or web address] - password [email or web address] [facility]  [facility] [place]  Office  [phone]  CC: Primary care physician; Mikey College, NP   Note: This dictation was prepared with Dragon dictation along with smaller phrase technology. Any transcriptional errors that result from this process are unintentional.

## 2018-08-21 NOTE — Progress Notes (Signed)
Patient off unit for heart catheterization. Will resume care upon return. Olivia Roth 

## 2018-08-21 NOTE — Care Management (Signed)
Awaiting transfer to [name] for CABG consideration

## 2018-08-21 NOTE — ED Provider Notes (Signed)
[facility] Emergency Department Provider Note   ____________________________________________   First MD Initiated Contact with Patient 08/21/18 [id number]     (approximate)  I have reviewed the triage vital signs and the nursing notes.   HISTORY  Chief Complaint Chest Pain    HPI CORAH WILLEFORD is a 62 y.o. female who presents to the ED from home with a chief complaint of throat burning.  Patient states she had some dietary indiscretions over the weekend.  Ate pasta and drink sangria on Sunday evening; shortly afterwards patient began to experience a burning sensation in her esophagus and throat.  Burning sensation has been intermittent since Sunday.  Last night patient drank some coffee before she went to bed.  Awoke with burning sensation which radiates to her throat.  Denies recent fever, chills, chest pain, shortness of breath, abdominal pain, nausea or vomiting.  Denies recent travel, trauma or OCP use.   Past Medical History:  Diagnosis Date  . Allergy    Seasonal allergies  . Asthma    In cold weather  . Diabetes mellitus without complication ([place])   . UTI (lower urinary tract infection)     Patient Active Problem List   Diagnosis Date Noted  . Hyperlipidemia associated with type 2 diabetes mellitus ([place]) 07/20/2016  . Hypertension 07/20/2016  . Asthma 07/05/2016  . Tachycardia 07/05/2016  . Routine general medical examination at a health care facility 06/16/2013  . Screen for colon cancer 06/16/2013  . Screening for breast cancer 06/16/2013  . DM (diabetes mellitus), type 2, uncontrolled ([place]) 06/16/2013    Past Surgical History:  Procedure Laterality Date  . ABDOMINAL HYSTERECTOMY  2006  . salpingooophrectomy Bilateral 2006    Prior to Admission medications   Medication Sig Start Date End Date Taking? Authorizing Provider  albuterol (PROVENTIL HFA;VENTOLIN HFA) 108 (90 Base) MCG/ACT inhaler Inhale 2 puffs into the lungs every 6 (six)  hours as needed for wheezing or shortness of breath. 09/10/17   Mikey College, NP  blood glucose meter kit and supplies KIT Dispense based on patient and insurance preference. Use up to four times daily as directed. For E11.65 Check blood glucose 3 times weekly on MWF 07/20/16   Krebs, Amy Lauren, NP  Cinnamon 500 MG TABS Take by mouth.    [provider]  glimepiride (AMARYL) 2 MG tablet Take 1 tablet (2 mg total) by mouth daily before breakfast. Patient not taking: Reported on 10/05/2017 07/20/16   Luciana Axe, NP  ipratropium (ATROVENT) 0.06 % nasal spray Place 2 sprays into both nostrils 4 (four) times daily. Patient not taking: Reported on 10/05/2017 09/10/17   Mikey College, NP  metFORMIN (GLUCOPHAGE) 500 MG tablet TAKE 2 TABLETS BY MOUTH ONCE EVERY MORNING AND 1 TABLET ONCE EVERY EVENING 08/12/18   Mikey College, NP  sitaGLIPtin (JANUVIA) 50 MG tablet Take 1 tablet (50 mg total) by mouth daily. 10/05/17   Mikey College, NP    Allergies Avocado; Cortisone; Pecan nut (diagnostic); Shellfish allergy; Advair diskus [fluticasone-salmeterol]; and Hyman Hopes allergy]  History reviewed. No pertinent family history.  Social History Social History   Tobacco Use  . Smoking status: Never Smoker  . Smokeless tobacco: Never Used  Substance Use Topics  . Alcohol use: Yes    Comment: occasional use  . Drug use: No    Review of Systems  Constitutional: No fever/chills Eyes: No visual changes. ENT: Positive for burning and sore throat. Cardiovascular: Denies chest  pain. Respiratory: Denies shortness of breath. Gastrointestinal: No abdominal pain.  No nausea, no vomiting.  No diarrhea.  No constipation. Genitourinary: Negative for dysuria. Musculoskeletal: Negative for back pain. Skin: Negative for rash. Neurological: Negative for headaches, focal weakness or numbness.   ____________________________________________   PHYSICAL  EXAM:  VITAL SIGNS: ED Triage Vitals [08/21/18 0337]  Enc Vitals Group     BP      Pulse Rate (!) 109     Resp 18     Temp (!) 97.4 F (36.3 C)     Temp Source Oral     SpO2 100 %     Weight      Height      Head Circumference      [place] Flow      Pain Score      Pain Loc      Pain Edu?      Excl. in [place]?     Constitutional: Alert and oriented. Well appearing and in mild acute distress. Eyes: Conjunctivae are normal. PERRL. EOMI. Head: Atraumatic. Nose: No congestion/rhinnorhea. Mouth/Throat: Mucous membranes are moist.  Oropharynx moderately erythematous without tonsillar swelling, exudates or peritonsillar abscess.  No hoarse or muffled voice.  There is no drooling. Neck: No stridor.   Cardiovascular: Normal rate, regular rhythm. Grossly normal heart sounds.  Good peripheral circulation. Respiratory: Normal respiratory effort.  No retractions. Lungs CTAB. Gastrointestinal: Soft and nontender to light or deep palpation. No distention. No abdominal bruits. No CVA tenderness. Musculoskeletal: No lower extremity tenderness nor edema.  No joint effusions. Neurologic:  Normal speech and language. No gross focal neurologic deficits are appreciated. No gait instability. Skin:  Skin is warm, dry and intact. No rash noted. Psychiatric: Mood and affect are normal. Speech and behavior are normal.  ____________________________________________   LABS (all labs ordered are listed, but only abnormal results are displayed)  Labs Reviewed  TROPONIN I - Abnormal; Notable for the following components:      Result Value   Troponin I 0.14 (*)    All other components within normal limits  COMPREHENSIVE METABOLIC PANEL - Abnormal; Notable for the following components:   Glucose, Bld 234 (*)    All other components within normal limits  CBC  LIPASE, BLOOD  TROPONIN I   ____________________________________________  EKG  ED ECG REPORT I, SUNG,JADE J, the attending physician, personally  viewed and interpreted this ECG.   Date: 08/21/2018  EKG Time: 0337  Rate: 107  Rhythm: sinus tachycardia  Axis: Normal  Intervals:none  ST&T Change: Nonspecific  ____________________________________________  RADIOLOGY  ED MD interpretation: Pulmonary vascular congestion  Official radiology report(s): Dg Chest Portable 1 View  Result Date: 08/21/2018 CLINICAL DATA:  Acute onset of sudden chest burning sensation. EXAM: PORTABLE CHEST 1 VIEW COMPARISON:  Chest radiograph performed 11/20/2007 FINDINGS: The lungs are well-aerated. Mild vascular congestion is noted. Peribronchial thickening is seen. There is no evidence of focal opacification, pleural effusion or pneumothorax. The cardiomediastinal silhouette is within normal limits. No acute osseous abnormalities are seen. IMPRESSION: Mild vascular congestion noted. Peribronchial thickening seen. Electronically Signed   By: Garald Balding M.D.   On: 08/21/2018 03:52    ____________________________________________   PROCEDURES  Procedure(s) performed: None  Procedures  Critical Care performed: Yes, see critical care note(s)   CRITICAL CARE Performed by: Paulette Blanch   Total critical care time: 30 minutes  Critical care time was exclusive of separately billable procedures and treating other patients.  Critical care was necessary to  treat or prevent imminent or life-threatening deterioration.  Critical care was time spent personally by me on the following activities: development of treatment plan with patient and/or surrogate as well as nursing, discussions with consultants, evaluation of patient's response to treatment, examination of patient, obtaining history from patient or surrogate, ordering and performing treatments and interventions, ordering and review of laboratory studies, ordering and review of radiographic studies, pulse oximetry and re-evaluation of patient's  condition.  ____________________________________________   INITIAL IMPRESSION / ASSESSMENT AND PLAN / ED COURSE  As part of my medical decision making, I reviewed the following data within the [place] History obtained from family, Nursing notes reviewed and incorporated, Labs reviewed, EKG interpreted, Old chart reviewed, Radiograph reviewed and Notes from prior ED visits   61 year old female with diabetes, hypertension, hyperlipidemia who presents with a 4-day history of intermittent throat burning associated with acidic foods and drinks. Differential diagnosis includes, but is not limited to, ACS, aortic dissection, pulmonary embolism, cardiac tamponade, pneumothorax, pneumonia, pericarditis, myocarditis, GI-related causes including esophagitis/gastritis, and musculoskeletal chest wall pain.    Will administer GI cocktail and 20 mg IV Pepcid.  Obtain screening lab work including LFTs and lipase.  Repeat timed troponin.  Will reassess.  Clinical Course as of Aug 21 429  Wed Aug 21, 2018  6203 Troponin noted.  Initiate aspirin, nitroglycerin, heparin bolus and drip.  Discussed with hospitalist to evaluate patient in the emergency department.  Patient and spouse updated.   [JS]    Clinical Course User Index [JS] Paulette Blanch, MD     ____________________________________________   FINAL CLINICAL IMPRESSION(S) / ED DIAGNOSES  Final diagnoses:  NSTEMI (non-ST elevated myocardial infarction) [facility])     ED Discharge Orders    None       Note:  This document was prepared using Dragon voice recognition software and may include unintentional dictation errors.    Paulette Blanch, MD 08/21/18 [phone]

## 2018-08-21 NOTE — H&P (View-Only) (Signed)
Cardiology Consultation:   Patient ID: Olivia Roth MRN: [id number]; DOB: 06/22/1956  Admit date: 08/21/2018 Date of Consult: 08/21/2018  Primary Care Provider: Mikey College, NP Primary Cardiologist: New -Consult by Rayya Yagi Primary Electrophysiologist:  None    Patient Profile:   Olivia Roth is a 62 y.o. female with a hx of type 2 diabetes mellitus, who is being seen today for the evaluation of throat pain and elevated troponin at the request of Dr. Jodell Cipro.  History of Present Illness:   Olivia Roth was in her usual state of health until 3 days ago, when she developed burning in her throat after eating spaghetti and drinking sangria.  She thought it was indigestion, the pain subsequently resolving after about 2 hours.  She otherwise felt well up until 3 this morning, when she awoke with more severe burning in her throat (10/10).  She noted mild accompanying dizziness and nausea but no shortness of breath or palpitations.  Due to severity of the pain, she presented to the emergency department, where she received aspirin and a GI cocktail with prompt resolution of the pain.  She is currently asymptomatic.  She has not had any exertional chest pain.  She notes some chronic dyspnea on exertion when walking up stairs quickly.  She denies a history of prior heart disease.  She has not had orthopnea, PND, or edema.  She notes that her medical follow-up has been intermittent with suboptimal control of her diabetes at times.  She is compliant with her metformin, which is the only medication that she takes regularly.  Of note, her last PCP visit was almost a year ago, which time her hemoglobin A1c was 10.9.  She was advised to increase metformin and start Januvia.  Past Medical History:  Diagnosis Date  . Allergy    Seasonal allergies  . Asthma    In cold weather  . Diabetes mellitus without complication ([place])   . UTI (lower urinary tract infection)     Past Surgical History:    Procedure Laterality Date  . ABDOMINAL HYSTERECTOMY  2006  . salpingooophrectomy Bilateral 2006     Home Medications:  Prior to Admission medications   Medication Sig Start Date Aima Mcwhirt Date Taking? Authorizing Provider  albuterol (PROVENTIL HFA;VENTOLIN HFA) 108 (90 Base) MCG/ACT inhaler Inhale 2 puffs into the lungs every 6 (six) hours as needed for wheezing or shortness of breath. 09/10/17  Yes Mikey College, NP  metFORMIN (GLUCOPHAGE) 500 MG tablet TAKE 2 TABLETS BY MOUTH ONCE EVERY MORNING AND 1 TABLET ONCE EVERY EVENING 08/12/18  Yes Mikey College, NP  blood glucose meter kit and supplies KIT Dispense based on patient and insurance preference. Use up to four times daily as directed. For E11.65 Check blood glucose 3 times weekly on MWF 07/20/16   Krebs, Amy Lauren, NP  Cinnamon 500 MG TABS Take by mouth.    [provider]  glimepiride (AMARYL) 2 MG tablet Take 1 tablet (2 mg total) by mouth daily before breakfast. Patient not taking: Reported on 10/05/2017 07/20/16   Luciana Axe, NP  ipratropium (ATROVENT) 0.06 % nasal spray Place 2 sprays into both nostrils 4 (four) times daily. Patient not taking: Reported on 10/05/2017 09/10/17   Mikey College, NP  sitaGLIPtin (JANUVIA) 50 MG tablet Take 1 tablet (50 mg total) by mouth daily. Patient not taking: Reported on 08/21/2018 10/05/17   Mikey College, NP    Inpatient Medications: Scheduled Meds: . alum & mag  hydroxide-simeth  30 mL Oral Once   And  . lidocaine  15 mL Oral Once  . aspirin  81 mg Oral Daily  . [START ON 08/22/2018] aspirin  81 mg Oral Pre-Cath  . atorvastatin  40 mg Oral q1800  . carvedilol  3.125 mg Oral BID WC  . [START ON 08/22/2018] Influenza vac split quadrivalent PF  0.5 mL Intramuscular Tomorrow-1000  . insulin aspart  0-15 Units Subcutaneous TID WC  . insulin aspart  0-5 Units Subcutaneous QHS  . sodium chloride flush  3 mL Intravenous Q12H   Continuous Infusions: .  sodium chloride    . [START ON 08/22/2018] sodium chloride     Followed by  . [START ON 08/22/2018] sodium chloride    . heparin 700 Units/hr (08/21/18 0558)   PRN Meds: sodium chloride, acetaminophen, albuterol, bisacodyl, morphine injection, nitroGLYCERIN, ondansetron (ZOFRAN) IV, senna-docusate, sodium chloride flush  Allergies:    Allergies  Allergen Reactions  . Avocado   . Cortisone Other (See Comments)    drowsiness  . Pecan Nut (Diagnostic) Itching  . Shellfish Allergy Hives  . Advair Diskus [Fluticasone-Salmeterol] Palpitations  . Salmon [Fish Allergy] Rash    Rash and itching of ears    Social History:   Social History   Socioeconomic History  . Marital status: Married    Spouse name: Not on file  . Number of children: 2  . Years of education: 11  . Highest education level: Not on file  Occupational History  . 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 Financial resource strain: Not on file  . Food insecurity:    Worry: Not on file    Inability: Not on file  . Transportation needs:    Medical: Not on file    Non-medical: Not on file  Tobacco Use  . Smoking status: Never Smoker  . Smokeless tobacco: Never Used  Substance and Sexual Activity  . Alcohol use: Yes    Comment: occasional use  . Drug use: No  . Sexual activity: Yes    Birth control/protection: Surgical  Lifestyle  . Physical activity:    Days per week: Not on file    Minutes per session: Not on file  . Stress: Not on file  Relationships  . Social connections:    Talks on phone: Not on file    Gets together: Not on file    Attends religious service: Not on file    Active member of club or organization: Not on file    Attends meetings of clubs or organizations: Not on file    Relationship status: Not on file  . Intimate partner violence:    Fear of current or ex partner: Not on file    Emotionally abused: Not on file     Physically abused: Not on file    Forced sexual activity: Not on file  Other Topics Concern  . Not on file  Social History Narrative  . Not on file    Family History:   No history of cardiovascular disease.  Her sister had uterine cancer.  Both parents died of "old age."  ROS:  Please see the history of present illness. All other ROS reviewed and negative.     Physical Exam/Data:   Vitals:   08/21/18 0430 08/21/18 0433 08/21/18 0445 08/21/18 0528  BP: 115/75  106/73 140/87  Pulse: 97  (!) 105 (!) 102  Resp: 15  14 18  Temp:    98.3 F (36.8 C)  TempSrc:    Oral  SpO2: 98%  96% 97%  Weight:  73 kg  75.5 kg  Height:  '5\' 1"'  (1.549 m)      Intake/Output Summary (Last 24 hours) at 08/21/2018 0748 Last data filed at 08/21/2018 0606 Gross per 24 hour  Intake 35 ml  Output 0 ml  Net 35 ml   Filed Weights   08/21/18 0433 08/21/18 0528  Weight: 73 kg 75.5 kg   Body mass index is 31.46 kg/m.  General:  Well nourished, well developed, in no acute distress.  Her husband is at the bedside. HEENT: normal Lymph: no adenopathy Neck: no JVD Endocrine:  No thryomegaly Vascular: No carotid bruits; 2+ radial and pedal pulses bilatrally. Cardiac:  normal S1, S2; RRR; no murmurs, rubs, or gallops. Lungs:  clear to auscultation bilaterally, no wheezing, rhonchi or rales  Abd: soft, nontender, no hepatomegaly  Ext: no edema Musculoskeletal:  No deformities, BUE and BLE strength normal and equal Skin: warm and dry  Neuro:  CNs 2-12 intact, no focal abnormalities noted Psych:  Normal affect   EKG:  The EKG was personally reviewed and demonstrates:  Sinus tachycardia with lateral ST depression and T-wave inversions. Telemetry:  Telemetry was personally reviewed and demonstrates:  NSR and sinus tachycardia.  Relevant CV Studies: None  Laboratory Data:  Chemistry Recent Labs  Lab 08/21/18 0340  NA 141  K 3.5  CL 106  CO2 23  GLUCOSE 234*  BUN 17  CREATININE 0.81  CALCIUM  9.0  GFRNONAA >60  GFRAA >60  ANIONGAP 12    Recent Labs  Lab 08/21/18 0340  PROT 7.5  ALBUMIN 4.2  AST 24  ALT 19  ALKPHOS 75  BILITOT 0.9   Hematology Recent Labs  Lab 08/21/18 0340  WBC 8.4  RBC 4.85  HGB 14.6  HCT 43.7  MCV 90.1  MCH 30.1  MCHC 33.4  RDW 12.7  PLT 255   Cardiac Enzymes Recent Labs  Lab 08/21/18 0340 08/21/18 0626  TROPONINI 0.14* 0.29*   No results for input(s): TROPIPOC in the last 168 hours.  BNP Recent Labs  Lab 08/21/18 0340  BNP 97.0    DDimer No results for input(s): DDIMER in the last 168 hours.  Radiology/Studies:  Dg Chest Portable 1 View  Result Date: 08/21/2018 CLINICAL DATA:  Acute onset of sudden chest burning sensation. EXAM: PORTABLE CHEST 1 VIEW COMPARISON:  Chest radiograph performed 11/20/2007 FINDINGS: The lungs are well-aerated. Mild vascular congestion is noted. Peribronchial thickening is seen. There is no evidence of focal opacification, pleural effusion or pneumothorax. The cardiomediastinal silhouette is within normal limits. No acute osseous abnormalities are seen. IMPRESSION: Mild vascular congestion noted. Peribronchial thickening seen. Electronically Signed   By: Garald Balding M.D.   On: 08/21/2018 03:52    Assessment and Plan:   NSTEMI Patient presents with 2 episodes of throat burning accompanied by nausea and lightheadedness over the last week.  Though the symptoms are atypical, positive troponin and ischemic EKG changes are concerning for underlying CAD and NSTEMI.  She is asymptomatic at this time.  Cardiac risk factors include uncontrolled diabetes mellitus and obesity.  We have discussed further evaluation and management options and have agreed to proceed with catheterization.  Proceed with left heart catheterization and possible PCI this morning.  I have reviewed the risks, indications, and alternatives to cardiac catheterization, possible angioplasty, and stenting with the patient. Risks  include but  are not limited to bleeding, infection, vascular injury, stroke, myocardial infection, arrhythmia, kidney injury, radiation-related injury in the case of prolonged fluoroscopy use, emergency cardiac surgery, and death. The patient understands the risks of serious complication is 1-2 in 2952 with diagnostic cardiac cath and 1-2% or less with angioplasty/stenting.  Continue aspirin and heparin infusion pending cath.  Continue high intensity statin therapy and low-dose carvedilol.  Hyperlipidemia Lipids not well controlled with LDL of 147.  Continue high intensity statin therapy.  Type 2 diabetes mellitus Uncontrolled on last check with hemoglobin A1c of 10.9 in 09/2017.  Recheck hemoglobin A1c.  Hold metformin pending catheterization.  Further diabetes management per hospitalist service.  For questions or updates, please contact [place] Please consult [email or web address] for contact info under [facility].  Signed, Nelva Bush, MD  08/21/2018 7:48 AM

## 2018-08-21 NOTE — Consult Note (Signed)
Cardiology Consultation:   Patient ID: MADA SADIK MRN: [id number]; DOB: 06/23/1956  Admit date: 08/21/2018 Date of Consult: 08/21/2018  Primary Care Provider: Mikey College, NP Primary Cardiologist: New -Consult by Kamonte Mcmichen Primary Electrophysiologist:  None    Patient Profile:   Olivia Roth is a 62 y.o. female with a hx of type 2 diabetes mellitus, who is being seen today for the evaluation of throat pain and elevated troponin at the request of Dr. Jodell Cipro.  History of Present Illness:   Ms. Staebell was in her usual state of health until 3 days ago, when she developed burning in her throat after eating spaghetti and drinking sangria.  She thought it was indigestion, the pain subsequently resolving after about 2 hours.  She otherwise felt well up until 3 this morning, when she awoke with more severe burning in her throat (10/10).  She noted mild accompanying dizziness and nausea but no shortness of breath or palpitations.  Due to severity of the pain, she presented to the emergency department, where she received aspirin and a GI cocktail with prompt resolution of the pain.  She is currently asymptomatic.  She has not had any exertional chest pain.  She notes some chronic dyspnea on exertion when walking up stairs quickly.  She denies a history of prior heart disease.  She has not had orthopnea, PND, or edema.  She notes that her medical follow-up has been intermittent with suboptimal control of her diabetes at times.  She is compliant with her metformin, which is the only medication that she takes regularly.  Of note, her last PCP visit was almost a year ago, which time her hemoglobin A1c was 10.9.  She was advised to increase metformin and start Januvia.  Past Medical History:  Diagnosis Date  . Allergy    Seasonal allergies  . Asthma    In cold weather  . Diabetes mellitus without complication ([place])   . UTI (lower urinary tract infection)     Past Surgical History:    Procedure Laterality Date  . ABDOMINAL HYSTERECTOMY  2006  . salpingooophrectomy Bilateral 2006     Home Medications:  Prior to Admission medications   Medication Sig Start Date Judieth Mckown Date Taking? Authorizing Provider  albuterol (PROVENTIL HFA;VENTOLIN HFA) 108 (90 Base) MCG/ACT inhaler Inhale 2 puffs into the lungs every 6 (six) hours as needed for wheezing or shortness of breath. 09/10/17  Yes Mikey College, NP  metFORMIN (GLUCOPHAGE) 500 MG tablet TAKE 2 TABLETS BY MOUTH ONCE EVERY MORNING AND 1 TABLET ONCE EVERY EVENING 08/12/18  Yes Mikey College, NP  blood glucose meter kit and supplies KIT Dispense based on patient and insurance preference. Use up to four times daily as directed. For E11.65 Check blood glucose 3 times weekly on MWF 07/20/16   Krebs, Amy Lauren, NP  Cinnamon 500 MG TABS Take by mouth.    [provider]  glimepiride (AMARYL) 2 MG tablet Take 1 tablet (2 mg total) by mouth daily before breakfast. Patient not taking: Reported on 10/05/2017 07/20/16   Luciana Axe, NP  ipratropium (ATROVENT) 0.06 % nasal spray Place 2 sprays into both nostrils 4 (four) times daily. Patient not taking: Reported on 10/05/2017 09/10/17   Mikey College, NP  sitaGLIPtin (JANUVIA) 50 MG tablet Take 1 tablet (50 mg total) by mouth daily. Patient not taking: Reported on 08/21/2018 10/05/17   Mikey College, NP    Inpatient Medications: Scheduled Meds: . alum & mag  hydroxide-simeth  30 mL Oral Once   And  . lidocaine  15 mL Oral Once  . aspirin  81 mg Oral Daily  . [START ON 08/22/2018] aspirin  81 mg Oral Pre-Cath  . atorvastatin  40 mg Oral q1800  . carvedilol  3.125 mg Oral BID WC  . [START ON 08/22/2018] Influenza vac split quadrivalent PF  0.5 mL Intramuscular Tomorrow-1000  . insulin aspart  0-15 Units Subcutaneous TID WC  . insulin aspart  0-5 Units Subcutaneous QHS  . sodium chloride flush  3 mL Intravenous Q12H   Continuous Infusions: .  sodium chloride    . [START ON 08/22/2018] sodium chloride     Followed by  . [START ON 08/22/2018] sodium chloride    . heparin 700 Units/hr (08/21/18 0558)   PRN Meds: sodium chloride, acetaminophen, albuterol, bisacodyl, morphine injection, nitroGLYCERIN, ondansetron (ZOFRAN) IV, senna-docusate, sodium chloride flush  Allergies:    Allergies  Allergen Reactions  . Avocado   . Cortisone Other (See Comments)    drowsiness  . Pecan Nut (Diagnostic) Itching  . Shellfish Allergy Hives  . Advair Diskus [Fluticasone-Salmeterol] Palpitations  . Salmon [Fish Allergy] Rash    Rash and itching of ears    Social History:   Social History   Socioeconomic History  . Marital status: Married    Spouse name: Not on file  . Number of children: 2  . Years of education: 53  . Highest education level: Not on file  Occupational History  . 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 Financial resource strain: Not on file  . Food insecurity:    Worry: Not on file    Inability: Not on file  . Transportation needs:    Medical: Not on file    Non-medical: Not on file  Tobacco Use  . Smoking status: Never Smoker  . Smokeless tobacco: Never Used  Substance and Sexual Activity  . Alcohol use: Yes    Comment: occasional use  . Drug use: No  . Sexual activity: Yes    Birth control/protection: Surgical  Lifestyle  . Physical activity:    Days per week: Not on file    Minutes per session: Not on file  . Stress: Not on file  Relationships  . Social connections:    Talks on phone: Not on file    Gets together: Not on file    Attends religious service: Not on file    Active member of club or organization: Not on file    Attends meetings of clubs or organizations: Not on file    Relationship status: Not on file  . Intimate partner violence:    Fear of current or ex partner: Not on file    Emotionally abused: Not on file     Physically abused: Not on file    Forced sexual activity: Not on file  Other Topics Concern  . Not on file  Social History Narrative  . Not on file    Family History:   No history of cardiovascular disease.  Her sister had uterine cancer.  Both parents died of "old age."  ROS:  Please see the history of present illness. All other ROS reviewed and negative.     Physical Exam/Data:   Vitals:   08/21/18 0430 08/21/18 0433 08/21/18 0445 08/21/18 0528  BP: 115/75  106/73 140/87  Pulse: 97  (!) 105 (!) 102  Resp: 15  14 18  Temp:    98.3 F (36.8 C)  TempSrc:    Oral  SpO2: 98%  96% 97%  Weight:  73 kg  75.5 kg  Height:  '5\' 1"'  (1.549 m)      Intake/Output Summary (Last 24 hours) at 08/21/2018 0748 Last data filed at 08/21/2018 0606 Gross per 24 hour  Intake 35 ml  Output 0 ml  Net 35 ml   Filed Weights   08/21/18 0433 08/21/18 0528  Weight: 73 kg 75.5 kg   Body mass index is 31.46 kg/m.  General:  Well nourished, well developed, in no acute distress.  Her husband is at the bedside. HEENT: normal Lymph: no adenopathy Neck: no JVD Endocrine:  No thryomegaly Vascular: No carotid bruits; 2+ radial and pedal pulses bilatrally. Cardiac:  normal S1, S2; RRR; no murmurs, rubs, or gallops. Lungs:  clear to auscultation bilaterally, no wheezing, rhonchi or rales  Abd: soft, nontender, no hepatomegaly  Ext: no edema Musculoskeletal:  No deformities, BUE and BLE strength normal and equal Skin: warm and dry  Neuro:  CNs 2-12 intact, no focal abnormalities noted Psych:  Normal affect   EKG:  The EKG was personally reviewed and demonstrates:  Sinus tachycardia with lateral ST depression and T-wave inversions. Telemetry:  Telemetry was personally reviewed and demonstrates:  NSR and sinus tachycardia.  Relevant CV Studies: None  Laboratory Data:  Chemistry Recent Labs  Lab 08/21/18 0340  NA 141  K 3.5  CL 106  CO2 23  GLUCOSE 234*  BUN 17  CREATININE 0.81  CALCIUM  9.0  GFRNONAA >60  GFRAA >60  ANIONGAP 12    Recent Labs  Lab 08/21/18 0340  PROT 7.5  ALBUMIN 4.2  AST 24  ALT 19  ALKPHOS 75  BILITOT 0.9   Hematology Recent Labs  Lab 08/21/18 0340  WBC 8.4  RBC 4.85  HGB 14.6  HCT 43.7  MCV 90.1  MCH 30.1  MCHC 33.4  RDW 12.7  PLT 255   Cardiac Enzymes Recent Labs  Lab 08/21/18 0340 08/21/18 0626  TROPONINI 0.14* 0.29*   No results for input(s): TROPIPOC in the last 168 hours.  BNP Recent Labs  Lab 08/21/18 0340  BNP 97.0    DDimer No results for input(s): DDIMER in the last 168 hours.  Radiology/Studies:  Dg Chest Portable 1 View  Result Date: 08/21/2018 CLINICAL DATA:  Acute onset of sudden chest burning sensation. EXAM: PORTABLE CHEST 1 VIEW COMPARISON:  Chest radiograph performed 11/20/2007 FINDINGS: The lungs are well-aerated. Mild vascular congestion is noted. Peribronchial thickening is seen. There is no evidence of focal opacification, pleural effusion or pneumothorax. The cardiomediastinal silhouette is within normal limits. No acute osseous abnormalities are seen. IMPRESSION: Mild vascular congestion noted. Peribronchial thickening seen. Electronically Signed   By: Garald Balding M.D.   On: 08/21/2018 03:52    Assessment and Plan:   NSTEMI Patient presents with 2 episodes of throat burning accompanied by nausea and lightheadedness over the last week.  Though the symptoms are atypical, positive troponin and ischemic EKG changes are concerning for underlying CAD and NSTEMI.  She is asymptomatic at this time.  Cardiac risk factors include uncontrolled diabetes mellitus and obesity.  We have discussed further evaluation and management options and have agreed to proceed with catheterization.  Proceed with left heart catheterization and possible PCI this morning.  I have reviewed the risks, indications, and alternatives to cardiac catheterization, possible angioplasty, and stenting with the patient. Risks  include but  are not limited to bleeding, infection, vascular injury, stroke, myocardial infection, arrhythmia, kidney injury, radiation-related injury in the case of prolonged fluoroscopy use, emergency cardiac surgery, and death. The patient understands the risks of serious complication is 1-2 in 0539 with diagnostic cardiac cath and 1-2% or less with angioplasty/stenting.  Continue aspirin and heparin infusion pending cath.  Continue high intensity statin therapy and low-dose carvedilol.  Hyperlipidemia Lipids not well controlled with LDL of 147.  Continue high intensity statin therapy.  Type 2 diabetes mellitus Uncontrolled on last check with hemoglobin A1c of 10.9 in 09/2017.  Recheck hemoglobin A1c.  Hold metformin pending catheterization.  Further diabetes management per hospitalist service.  For questions or updates, please contact [place] Please consult [email or web address] for contact info under [facility] Card[facility].  Signed, Nelva Bush, MD  08/21/2018 7:48 AM

## 2018-08-21 NOTE — ED Triage Notes (Signed)
Pt c/o chest burning sensation that started on Sunday and has been intermittent since. This AM pt woke with sudden pain and burning. Pt denies SOB, N/V.

## 2018-08-22 ENCOUNTER — Inpatient Hospital Stay (HOSPITAL_COMMUNITY): Payer: Managed Care, Other (non HMO)

## 2018-08-22 ENCOUNTER — Other Ambulatory Visit: Payer: Self-pay

## 2018-08-22 DIAGNOSIS — E119 Type 2 diabetes mellitus without complications: Secondary | ICD-10-CM

## 2018-08-22 DIAGNOSIS — I25119 Atherosclerotic heart disease of native coronary artery with unspecified angina pectoris: Secondary | ICD-10-CM

## 2018-08-22 DIAGNOSIS — I255 Ischemic cardiomyopathy: Secondary | ICD-10-CM

## 2018-08-22 DIAGNOSIS — I214 Non-ST elevation (NSTEMI) myocardial infarction: Secondary | ICD-10-CM

## 2018-08-22 DIAGNOSIS — E1100 Type 2 diabetes mellitus with hyperosmolarity without nonketotic hyperglycemic-hyperosmolar coma (NKHHC): Secondary | ICD-10-CM

## 2018-08-22 DIAGNOSIS — I351 Nonrheumatic aortic (valve) insufficiency: Secondary | ICD-10-CM

## 2018-08-22 DIAGNOSIS — E7800 Pure hypercholesterolemia, unspecified: Secondary | ICD-10-CM

## 2018-08-22 DIAGNOSIS — I25110 Atherosclerotic heart disease of native coronary artery with unstable angina pectoris: Secondary | ICD-10-CM

## 2018-08-22 LAB — PULMONARY FUNCTION TEST
FEF 25-75 Pre: 1.56 L/sec
FEF2575-%Pred-Pre: 76 %
FEV1-%Pred-Pre: 72 %
FEV1-Pre: 1.6 L
FEV1FVC-%Pred-Pre: 105 %
FEV6-%Pred-Pre: 71 %
FEV6-Pre: 1.96 L
FEV6FVC-%Pred-Pre: 104 %
FVC-%Pred-Pre: 68 %
FVC-Pre: 1.96 L
Pre FEV1/FVC ratio: 82 %
Pre FEV6/FVC Ratio: 100 %

## 2018-08-22 LAB — URINALYSIS, ROUTINE W REFLEX MICROSCOPIC
Bilirubin Urine: NEGATIVE
Glucose, UA: 250 mg/dL — AB
Hgb urine dipstick: NEGATIVE
Ketones, ur: NEGATIVE mg/dL
Nitrite: POSITIVE — AB
Protein, ur: NEGATIVE mg/dL
Specific Gravity, Urine: >1.03 — ABNORMAL HIGH (ref 1.005–1.030)
pH: 6 (ref 5.0–8.0)

## 2018-08-22 LAB — CBC
HCT: 36.6 % (ref 36.0–46.0)
Hemoglobin: 12.3 g/dL (ref 12.0–15.0)
MCH: 30.1 pg (ref 26.0–34.0)
MCHC: 33.6 g/dL (ref 30.0–36.0)
MCV: 89.5 fL (ref 80.0–100.0)
Platelets: 199 10*3/uL (ref 150–400)
RBC: 4.09 MIL/uL (ref 3.87–5.11)
RDW: 12.9 % (ref 11.5–15.5)
WBC: 6.8 10*3/uL (ref 4.0–10.5)
nRBC: 0 % (ref 0.0–0.2)

## 2018-08-22 LAB — TROPONIN I: Troponin I: 3.93 ng/mL (ref <0.03)

## 2018-08-22 LAB — BASIC METABOLIC PANEL
Anion gap: 12 (ref 5–15)
BUN: 14 mg/dL (ref 8–23)
CO2: 17 mmol/L — ABNORMAL LOW (ref 22–32)
Calcium: 8.5 mg/dL — ABNORMAL LOW (ref 8.9–10.3)
Chloride: 109 mmol/L (ref 98–111)
Creatinine, Ser: 0.9 mg/dL (ref 0.44–1.00)
GFR calc Af Amer: >60 mL/min (ref >60)
GFR calc non Af Amer: >60 mL/min (ref >60)
Glucose, Bld: 192 mg/dL — ABNORMAL HIGH (ref 70–99)
Potassium: 3.7 mmol/L (ref 3.5–5.1)
Sodium: 138 mmol/L (ref 135–145)

## 2018-08-22 LAB — ECHOCARDIOGRAM COMPLETE
Height: 61 in
Weight: 2604.8 oz

## 2018-08-22 LAB — HEPARIN LEVEL (UNFRACTIONATED)
Heparin Unfractionated: 0.15 IU/mL — ABNORMAL LOW (ref 0.30–0.70)
Heparin Unfractionated: 0.44 IU/mL (ref 0.30–0.70)

## 2018-08-22 LAB — URINALYSIS, MICROSCOPIC (REFLEX)

## 2018-08-22 LAB — GLUCOSE, CAPILLARY
Glucose-Capillary: 188 mg/dL — ABNORMAL HIGH (ref 70–99)
Glucose-Capillary: 210 mg/dL — ABNORMAL HIGH (ref 70–99)
Glucose-Capillary: 160 mg/dL — ABNORMAL HIGH (ref 70–99)
Glucose-Capillary: 204 mg/dL — ABNORMAL HIGH (ref 70–99)

## 2018-08-22 LAB — HIV ANTIBODY (ROUTINE TESTING W REFLEX): HIV Screen 4th Generation wRfx: NONREACTIVE

## 2018-08-22 LAB — SURGICAL PCR SCREEN
MRSA, PCR: NEGATIVE
Staphylococcus aureus: POSITIVE — AB

## 2018-08-22 NOTE — Progress Notes (Signed)
CRITICAL VALUE ALERT  Critical Value:  Troponin 3.93  Date & Time Notied:  08/22/18 at 0336   Provider Notified: MD on call  Orders Received/Actions taken: No orders received. Patient resting comfortably, will continue to monitor.

## 2018-08-22 NOTE — Consult Note (Addendum)
[address]       [place] [address]             [phone]        Justis D Whitsitt Bratenahl Medical Record #[id number] Date of Birth: 04/17/1956  Referring: Dr. Saunders Revel, MD Primary Care: Mikey College, NP Primary Cardiologist: New to [place]-Dr. Martinique  Chief Complaint:  Burning in throat Reason for consultation: S/p NSTEMI, coronary artery disease  History of Present Illness:     This is a 62 year old female with a past medical history of hyperlipidemia, diabetes mellitus (Type II) who was in her usual state of health until a few days ago when she developed burning in her throat after eating spaghetti and drinking sangria. At first, she thought this might be indigestion as the pain resolved after a few hours. At 3 am on 08/21/2018 she had more severe burning in her throat. Also, she had dizziness and nausea. She denied chest pain or shortness of breath. She presented initially to [facility].  EKG showed sinus tachycardia with lateral ST depression and T wave inversions. Initial Troponin I was 0.14 and most recently has peaked at 3.93. She ruled in for a NSTEMI. She underwent cardiac catheterization on 08/21/2018. Results showed LVEF 30-35% and severe three vessel coronary artery disease. Patient was transferred to [facility] last evening for consideration of coronary artery bypass grafting surgery. Currently, patient is in sinus rhythm with HR in the 80's. She denies throat burning, chest pain or shortness of breath. Family and her boss are at bedside.  Current Activity/ Functional Status: Patient is independent with mobility/ambulation, transfers, ADL's, IADL's.   Zubrod Score: At the time of surgery this patient's most appropriate activity status/level should be described as: _0     0    Normal activity, no symptoms _1     1    Restricted in physical strenuous activity but ambulatory, able to do out light work _2     2     Ambulatory and capable of self care, unable to do work activities, up and about                 more than 50%  Of the time                            _3     3    Only limited self care, in bed greater than 50% of waking hours _4     4    Completely disabled, no self care, confined to bed or chair _5     5    Moribund  Past Medical History:  Diagnosis Date  . Anxiety   . Asthma    In cold weather (08/21/2018)  . Family history of adverse reaction to anesthesia    "mother PONV; talked crazy/loud after anesthesia" (08/21/2018)  . GERD (gastroesophageal reflux disease)   . High cholesterol   . NSTEMI (non-ST elevated myocardial infarction) ([facility]) 08/20/2018  . Seasonal allergies   . Tuberculosis ~ 1960  . Type II diabetes mellitus ([facility])   . UTI (lower urinary tract infection)    "once; before hysterectomy" (08/21/2018)    Past Surgical History:  Procedure Laterality Date  . GANGLION CYST EXCISION Left ~ 1960   neck  . LEFT HEART CATH AND CORONARY ANGIOGRAPHY N/A 08/21/2018   Procedure: LEFT HEART CATH AND CORONARY ANGIOGRAPHY;  Surgeon: Nelva Bush,  MD;  Location: [facility];  Service: Cardiovascular;  Laterality: N/A;  . TOTAL ABDOMINAL HYSTERECTOMY  2006    Social History   Tobacco Use  Smoking Status Never Smoker  Smokeless Tobacco Never Used    Social History   Substance and Sexual Activity  Alcohol Use Yes   Comment: 08/21/2018 "glass of sangria or a beer 3-4 times/year"     Allergies  Allergen Reactions  . Avocado   . Cortisone Other (See Comments)    drowsiness  . Pecan Nut (Diagnostic) Itching  . Shellfish Allergy Hives  . Advair Diskus [Fluticasone-Salmeterol] Palpitations  . Salmon [Fish Allergy] Rash    Rash and itching of ears    Current Facility-Administered Medications  Medication Dose Route Frequency Provider Last Rate Last Dose  . 0.9 %  sodium chloride infusion   Intravenous PRN Martinique, Tejuan Gholson M, MD 10 mL/hr at 08/21/18 1955 500 mL at  08/21/18 1955  . acetaminophen (TYLENOL) tablet 650 mg  650 mg Oral Q4H PRN Almyra Deforest, PA      . aspirin EC tablet 81 mg  81 mg Oral Daily Almyra Deforest, [place]   81 mg at 08/22/18 0824  . atorvastatin (LIPITOR) tablet 80 mg  80 mg Oral q1800 Almyra Deforest, PA   80 mg at 08/21/18 1945  . carvedilol (COREG) tablet 3.125 mg  3.125 mg Oral BID WC Almyra Deforest, PA   3.125 mg at 08/22/18 0824  . heparin ADULT infusion 100 units/mL (25000 units/220m sodium chloride 0.45%)  1,200 Units/hr Intravenous Continuous JMartinique Allayna Erlich M, MD 12 mL/hr at 08/22/18 0547 1,200 Units/hr at 08/22/18 0547  . insulin aspart (novoLOG) injection 0-5 Units  0-5 Units Subcutaneous QHS WDurenda Age MD   3 Units at 08/21/18 2305  . insulin aspart (novoLOG) injection 0-9 Units  0-9 Units Subcutaneous TID WC MAlmyra Deforest PA   2 Units at 08/22/18 0824  . ondansetron (ZOFRAN) injection 4 mg  4 mg Intravenous Q6H PRN MAlmyra Deforest PA        Medications Prior to Admission  Medication Sig Dispense Refill Last Dose  . albuterol (PROVENTIL HFA;VENTOLIN HFA) 108 (90 Base) MCG/ACT inhaler Inhale 2 puffs into the lungs every 6 (six) hours as needed for wheezing or shortness of breath. 1 Inhaler 11 unk at prn  . Cinnamon 500 MG TABS Take 500 mg by mouth daily.    08/19/2018  . metFORMIN (GLUCOPHAGE) 500 MG tablet TAKE 2 TABLETS BY MOUTH ONCE EVERY MORNING AND 1 TABLET ONCE EVERY EVENING (Patient taking differently: Take 500-1,000 mg by mouth 2 (two) times daily with a meal. TAKE 2 TABLETS (10030m BY MOUTH ONCE EVERY MORNING AND 1 TABLET (50047mONCE EVERY EVENING) 90 tablet 0 08/20/2018  . blood glucose meter kit and supplies KIT Dispense based on patient and insurance preference. Use up to four times daily as directed. For E11.65 Check blood glucose 3 times weekly on MWF 1 each 0 Taking  . glimepiride (AMARYL) 2 MG tablet Take 1 tablet (2 mg total) by mouth daily before breakfast. (Patient not taking: Reported on 10/05/2017) 30 tablet 3 Not Taking at  Unknown time  . ipratropium (ATROVENT) 0.06 % nasal spray Place 2 sprays into both nostrils 4 (four) times daily. (Patient not taking: Reported on 10/05/2017) 15 mL 0 Not Taking at Unknown time  . sitaGLIPtin (JANUVIA) 50 MG tablet Take 1 tablet (50 mg total) by mouth daily. (Patient not taking: Reported on 08/21/2018) 30 tablet 5 Not Taking at  Unknown time    Family History  Problem Relation Age of Onset  . Cancer Aunt   . Heart disease Neg Hx   Mother lived until age 21 and father until age 46 She is married, has 2 children, and is a principal at a school in [place]  Review of Systems:     Cardiac Review of Systems: Y or  [  N  ]= no  Chest Pain [ N   ]   Exertional SOB  [ Y ]  Orthopnea Aqua.Slicker  ]   Pedal Edema Aqua.Slicker   ]    Palpitations Aqua.Slicker  ] Syncope  [ N ]   Presyncope [ N  ]  General Review of Systems: [Y] = yes [N  ]=no Constitional: ; nausea [ Y ]; night sweats [  N]; fever [ N ]; or chills [  N]                                                                Eye : blurred vision [ N ]; diplopia Aqua.Slicker   ]; Amaurosis fugax[ N ]; Resp: cough [ N ];  wheezing[ N ];  hemoptysis[  N];   GI:  vomiting[ one episode ];  dysphagia[ N ]; melena[ N ];  hematochezia [ N ]; GU:  hematuria[ N ];   dysuria Aqua.Slicker  ];  Skin: rash, swelling[N  ];,   Heme/Lymph: anemia[N ];  Neuro: TIA[ N ];  stroke[  N];  vertigo[ N ];  seizures[N];       Psych:depression[N  ]; anxiety[ Y ];  Endocrine: diabetes[  Y];  thyroid dysfunction[N  ];         Physical Exam: BP (!) 118/59 (BP Location: Left Arm)   Pulse 75   Temp 98.5 F (36.9 C) (Oral)   Resp 17   Ht _0  (1.549 m)   Wt 73.8 kg   SpO2 97%   BMI 30.76 kg/m    General appearance: alert, cooperative and no distress Head: Normocephalic, without obvious abnormality, atraumatic Neck: no carotid bruit, no JVD and supple, symmetrical, trachea midline Resp: clear to auscultation bilaterally Cardio: RRR, no murmur GI: Soft, non tender, bowel sounds  present Extremities: No LE edema;palpable pulses bilaterally feet Neurologic: Grossly normal  Diagnostic Studies & Laboratory data:     Recent Radiology Findings:   Dg Chest Portable 1 View  Result Date: 08/21/2018 CLINICAL DATA:  Acute onset of sudden chest burning sensation. EXAM: PORTABLE CHEST 1 VIEW COMPARISON:  Chest radiograph performed 11/20/2007 FINDINGS: The lungs are well-aerated. Mild vascular congestion is noted. Peribronchial thickening is seen. There is no evidence of focal opacification, pleural effusion or pneumothorax. The cardiomediastinal silhouette is within normal limits. No acute osseous abnormalities are seen. IMPRESSION: Mild vascular congestion noted. Peribronchial thickening seen. Electronically Signed   By: Garald Balding M.D.   On: 08/21/2018 03:52   LEFT HEART CATH AND CORONARY ANGIOGRAPHY on 08/21/2018:  Conclusion   Conclusions: 1. Severe three-vessel coronary artery disease, including 50% distal LMCA, 80% ostial/proximal LAD, and 90% ostial LCx, and 99% mid RCA stenoses. 2. Moderately to severely reduced left ventricular systolic function (LVEF 96-75%) with global hypokinesis and mid/apical akinesis. 3. Moderately elevated left ventricular filling pressure.  Recommendations: 1. Transfer  to [facility] for surgical consultation for CABG. 2. Gentle diuresis. 3. Restart heparin infusion 2 hours after TR band deflation. 4. Aggressive secondary prevention and medical therapy.  Will continue carvedilol 3.125 mg BID and increase atorvastatin to 80 mg daily.  Recommend uninterrupted dual antiplatelet therapy with aspirin 60m daily and clopidogrel 749mdaily for a minimum of 12 months (ACS - Class I recommendation).  I will defer starting clopidogrel pending cardiac surgery consultation.  ChNelva BushMD CHThree Wayager: [phone]   Vessel is large.  Dist LM lesion 50% stenosed  Dist LM lesion is 50% stenosed.  Left Anterior Descending   Vessel is moderate in size. There is moderate diffuse disease throughout the vessel.  Ost LAD to Prox LAD lesion 80% stenosed  Ost LAD to Prox LAD lesion is 80% stenosed. The lesion is severely calcified.  Mid LAD lesion 60% stenosed  Mid LAD lesion is 60% stenosed.  Dist LAD lesion 70% stenosed  Dist LAD lesion is 70% stenosed.  First Diagonal Branch  Vessel is small in size.  Left Circumflex  Ost Cx lesion 90% stenosed  Ost Cx lesion is 90% stenosed.  First Obtuse Marginal Branch  Vessel is moderate in size.  Ost 1st Mrg to 1st Mrg lesion 50% stenosed  Ost 1st Mrg to 1st Mrg lesion is 50% stenosed.  Second Obtuse Marginal Branch  Vessel is small in size.  Third Obtuse Marginal Branch  Vessel is moderate in size.  Ost 3rd Mrg lesion 70% stenosed  Ost 3rd Mrg lesion is 70% stenosed.  Fourth Obtuse Marginal Branch  Vessel is moderate in size.  Ost 4th Mrg lesion 80% stenosed  Ost 4th Mrg lesion is 80% stenosed.  Left Posterior Descending Artery  Vessel is small in size.  Right Coronary Artery  Vessel is moderate in size.  Prox RCA to Dist RCA lesion 99% stenosed  Prox RCA to Dist RCA lesion is 99% stenosed. TIMI-1 flow with collaterals via RV marginal branch.  Right Posterior Descending Artery  Vessel is small in size.  Right Posterior Atrioventricular Branch  Vessel is small in size.  Intervention   No interventions have been documented.  Wall Motion   Resting               Left Heart   Left Ventricle There is moderate to severe left ventricular systolic dysfunction. LVEF 30-35%. LV end diastolic pressure is moderately elevated. There are LV function abnormalities. There is no evidence of mitral regurgitation.  Aortic Valve There is no aortic valve stenosis.  Coronary Diagrams   Diagnostic Diagram          I have independently reviewed the above radiologic studies and discussed with the patient   Recent Lab Findings: Lab Results  Component Value Date    WBC 6.8 08/22/2018   HGB 12.3 08/22/2018   HCT 36.6 08/22/2018   PLT 199 08/22/2018   GLUCOSE 192 (H) 08/22/2018   CHOL 252 (H) 08/21/2018   TRIG 360 (H) 08/21/2018   HDL 33 (L) 08/21/2018   LDLCALC 147 (H) 08/21/2018   ALT 19 08/21/2018   AST 24 08/21/2018   NA 138 08/22/2018   K 3.7 08/22/2018   CL 109 08/22/2018   CREATININE 0.90 08/22/2018   BUN 14 08/22/2018   CO2 17 (L) 08/22/2018   TSH 2.64 07/04/2017   INR 1.02 08/21/2018   HGBA1C 8.4 (H) 08/21/2018   Assessment / Plan:   1. S/p NSTEMI, ischemic cardiomyopathy (  LVEF 30-35%),coronary artery disease-on Heparin drip and Coreg 3.125 mg bid. Would benefit from coronary artery bypass grafting surgery. Scheduled for Monday 08/26/2018 with Dr. Prescott Gum. Continue with pre op testing. Echo taken but not read yet. 2. History of hyperlipidemia-on Atorvastatin 80 mg at hs 3. History of diabetes mellitus-HGA1C 8.4 Will need close medical follow up after discharge. On Metformin 1000 mg every am and 500 mg every pm, Glimepiride 2 mg at breakfast, and Sitagliptin 50 mg daily.  I  spent 20 minutes counseling the patient face to face.   Lars Pinks PA-C 08/22/2018 10:48 AM Patient examined, images of coronary angiogram and 2D echocardiogram personally reviewed and counseled with patient.  Agree with above assessment as outlined by Ms. Tacy Dura PA-C  62 year old overweight diabetic non-smoker presents with non-STEMI and was found to have severe three-vessel CAD with cardiac cath.  LV function appears to be mildly reduced and there is no significant valvular disease on echocardiogram.  The patient has diffuse diabetic pattern of disease with difficult targets but I agree with the patient's cardiologist that CABG would be her best long-term therapy for relief of symptoms, preservation of LV function, and improved survival.  I have discussed the procedure of CABG with the patient and her family and they agree to proceed with surgery  which is scheduled for Monday, November 18.

## 2018-08-22 NOTE — Progress Notes (Signed)
Progress Note  Patient Name: Olivia Roth Date of Encounter: 08/22/2018  Primary Cardiologist: No primary care provider on file.   Subjective   Denies any chest pain at present.  Transferred from [facility] yesterday with severe CAD awaiting CT surgery evaluation  Inpatient Medications    Scheduled Meds: . aspirin EC  81 mg Oral Daily  . atorvastatin  80 mg Oral q1800  . carvedilol  3.125 mg Oral BID WC  . insulin aspart  0-5 Units Subcutaneous QHS  . insulin aspart  0-9 Units Subcutaneous TID WC   Continuous Infusions: . sodium chloride 500 mL (08/21/18 1955)  . heparin 1,200 Units/hr (08/22/18 0547)   PRN Meds: sodium chloride, acetaminophen, ondansetron (ZOFRAN) IV   Vital Signs    Vitals:   08/21/18 1954 08/22/18 0013 08/22/18 0550 08/22/18 0750  BP: 120/70 108/73 (!) 109/58 (!) 118/59  Pulse: 88 81 76 75  Resp:  17    Temp: 97.9 F (36.6 C) 98 F (36.7 C) 98.1 F (36.7 C) 98.5 F (36.9 C)  TempSrc: Oral Oral Oral Oral  SpO2: 97% 97% 97% 97%  Weight:   73.8 kg   Height:        Intake/Output Summary (Last 24 hours) at 08/22/2018 0846 Last data filed at 08/22/2018 0547 Gross per 24 hour  Intake 164.78 ml  Output -  Net 164.78 ml   Filed Weights   08/21/18 1750 08/22/18 0550  Weight: 73.7 kg 73.8 kg    Telemetry    NSR - Personally Reviewed  ECG    Normal sinus rhythm with anterior ST-T wave of normality consistent with ischemia, inferior infarct - Personally Reviewed  Physical Exam   GEN: No acute distress.   Neck: No JVD Cardiac: RRR, no murmurs, rubs, or gallops.  Respiratory: Clear to auscultation bilaterally. GI: Soft, nontender, non-distended  MS: No edema; No deformity. Neuro:  Nonfocal  Psych: Normal affect   Labs    Chemistry Recent Labs  Lab 08/21/18 0340 08/21/18 0347 08/21/18 0351 08/22/18 0204  NA 141 139 139 138  K 3.5 3.9 3.9 3.7  CL 106 107 106 109  CO2 23  --   --  17*  GLUCOSE 234* 238* 242* 192*  BUN 17 19 19  14   CREATININE 0.81 0.80 0.80 0.90  CALCIUM 9.0  --   --  8.5*  PROT 7.5  --   --   --   ALBUMIN 4.2  --   --   --   AST 24  --   --   --   ALT 19  --   --   --   ALKPHOS 75  --   --   --   BILITOT 0.9  --   --   --   GFRNONAA >60  --   --  >60  GFRAA >60  --   --  >60  ANIONGAP 12  --   --  12     Hematology Recent Labs  Lab 08/21/18 0340 08/21/18 0347 08/21/18 0351 08/22/18 0204  WBC 8.4  --   --  6.8  RBC 4.85  --   --  4.09  HGB 14.6 14.6 15.0 12.3  HCT 43.7 43.0 44.0 36.6  MCV 90.1  --   --  89.5  MCH 30.1  --   --  30.1  MCHC 33.4  --   --  33.6  RDW 12.7  --   --  12.9  PLT  255  --   --  199    Cardiac Enzymes Recent Labs  Lab 08/21/18 0626 08/21/18 1344 08/21/18 1906 08/22/18 0204  TROPONINI 0.29* 2.63* 2.57* 3.93*   No results for input(s): TROPIPOC in the last 168 hours.   BNP Recent Labs  Lab 08/21/18 0340  BNP 97.0     DDimer No results for input(s): DDIMER in the last 168 hours.   Radiology    Dg Chest Portable 1 View  Result Date: 08/21/2018 CLINICAL DATA:  Acute onset of sudden chest burning sensation. EXAM: PORTABLE CHEST 1 VIEW COMPARISON:  Chest radiograph performed 11/20/2007 FINDINGS: The lungs are well-aerated. Mild vascular congestion is noted. Peribronchial thickening is seen. There is no evidence of focal opacification, pleural effusion or pneumothorax. The cardiomediastinal silhouette is within normal limits. No acute osseous abnormalities are seen. IMPRESSION: Mild vascular congestion noted. Peribronchial thickening seen. Electronically Signed   By: Roanna RaiderJeffery  Chang M.D.   On: 08/21/2018 03:52    Cardiac Studies  Cardiac Cath 08/2018 Conclusions: 1. Severe three-vessel coronary artery disease, including 50% distal LMCA, 80% ostial/proximal LAD, and 90% ostial LCx, and 99% mid RCA stenoses. 2. Moderately to severely reduced left ventricular systolic function (LVEF 30-35%) with global hypokinesis and mid/apical  akinesis. 3. Moderately elevated left ventricular filling pressure.  Recommendations: 1. Transfer to [name] for [facility] consultation for CABG. 2. Gentle diuresis. 3. Restart heparin infusion 2 hours after TR band deflation. 4. Aggressive secondary prevention and medical therapy.  Will continue carvedilol 3.125 mg BID and increase atorvastatin to 80 mg daily.  Recommend uninterrupted dual antiplatelet therapy with aspirin 81mg  daily and clopidogrel 75mg  daily for a minimum of 12 months (ACS - Class I recommendation).  I will defer starting clopidogrel pending cardiac surgery consultation.   Patient Profile     [age] y.o. female with history of type 2 diabetes mellitus and obesity, who presented to [facility] with throat pain and found to have severe three-vessel CAD by cardiac cath.  She is now transferred to [facility] for surgical evaluation for CABG.  Assessment & Plan    1.  NSTEMI -Patient had atypical symptoms of angina and presented with throat pain -She ruled in for MI with peak troponin 3.93 thus far -Cardiac cath revealed severe three-vessel CAD -Continue to cycle troponin until it peaks -CVTS consult pending -Continue aspirin 81 mg daily, high-dose Lipitor 80 mg daily, carvedilol 3.125 mg twice daily and IV heparin. -Needs aggressive risk factor modification.  Hemoglobin A1c was 8.4 which is actually improved from what it spent over the past 2 years.  2.  Hyperlipidemia -LDL was 147 on admission and triglycerides 360.  She is now on high-dose Lipitor 80 mg daily -We will need a repeat FLP and ALT in 6 weeks  3.  Type 2 diabetes mellitus -BS check q. before meals and at bedtime with sliding scale insulin coverage -Metformin is on hold post-cath -He was also on Januvia at home  4.  Ischemic dilated cardiomyopathy -EF 30 to 35% by cardiac cath -2D echocardiogram today -Continue carvedilol 3.125 mg twice daily -BP borderline soft sometimes so we will hold off on the  addition of carb at this time but add losartan 25 mg daily prior to discharge if blood pressure stable  For questions or updates, please contact [facility] Please consult [email or web address] for contact info under Cardiology/STEMI.      Signed, Armanda Magicraci Dekendrick Uzelac, MD  08/22/2018, 8:46 AM

## 2018-08-22 NOTE — Progress Notes (Signed)
  Echocardiogram 2D Echocardiogram has been performed.  Leta JunglingCooper, Jonah Nestle M 08/22/2018, 1:17 PM

## 2018-08-22 NOTE — Care Management Note (Signed)
Case Management Note  Patient Details  Name: Vinson MoselleMaria D Kratz MRN: [id number] Date of Birth: September 02, 1956  Subjective/Objective:   Pt presented as a transfer from [facility] 2/2 Chest Pain. Plan for CABG 08-26-18. PTA independent from home with support of husband. Patient states she is still working.                 Action/Plan: CM will continue to monitor for additional disposition needs.   Expected Discharge Date:                  Expected Discharge Plan:  Home/Self Care  In-House Referral:  NA  Discharge planning Services  CM Consult  Post Acute Care Choice:    Choice offered to:     DME Arranged:    DME Agency:     HH Arranged:    HH Agency:     Status of Service:  In process, will continue to follow  If discussed at Long Length of Stay Meetings, dates discussed:    Additional Comments:  Gala LewandowskyGraves-Bigelow, Previn Jian Kaye, RN 08/22/2018, 12:49 PM

## 2018-08-22 NOTE — Progress Notes (Signed)
ANTICOAGULATION CONSULT NOTE  Pharmacy Consult for heparin Indication: chest pain/ACS  Allergies  Allergen Reactions  . Avocado   . Cortisone Other (See Comments)    drowsiness  . Pecan Nut (Diagnostic) Itching  . Shellfish Allergy Hives  . Advair Diskus [Fluticasone-Salmeterol] Palpitations  . Salmon [Fish Allergy] Rash    Rash and itching of ears    Patient Measurements: Height: 5\' 1"  (154.9 cm) Weight: 162 lb 12.8 oz (73.8 kg) IBW/kg (Calculated) : 47.8 Heparin Dosing Weight: 63.7 kg  Vital Signs: Temp: 98.5 F (36.9 C) (11/14 0750) Temp Source: Oral (11/14 0750) BP: 118/59 (11/14 0750) Pulse Rate: 75 (11/14 0750)  Labs: Recent Labs    08/21/18 0340 08/21/18 0347 08/21/18 0351 08/21/18 0441  08/21/18 1344 08/21/18 1906 08/22/18 0204 08/22/18 0947  HGB 14.6 14.6 15.0  --   --   --   --  12.3  --   HCT 43.7 43.0 44.0  --   --   --   --  36.6  --   PLT 255  --   --   --   --   --   --  199  --   APTT  --   --   --  30  --   --   --   --   --   LABPROT  --   --   --  13.3  --   --   --   --   --   INR  --   --   --  1.02  --   --   --   --   --   HEPARINUNFRC  --   --   --   --   --   --  <0.10* 0.15* 0.44  CREATININE 0.81 0.80 0.80  --   --   --   --  0.90  --   TROPONINI 0.14*  --   --   --    < > 2.63* 2.57* 3.93*  --    < > = values in this interval not displayed.    Estimated Creatinine Clearance: 58.8 mL/min (by C-G formula based on SCr of 0.9 mg/dL).   Medical History: Past Medical History:  Diagnosis Date  . Anxiety   . Asthma    In cold weather (08/21/2018)  . Family history of adverse reaction to anesthesia    "mother PONV; talked crazy/loud after anesthesia" (08/21/2018)  . GERD (gastroesophageal reflux disease)   . High cholesterol   . NSTEMI (non-ST elevated myocardial infarction) ([facility]) 08/20/2018  . Seasonal allergies   . Tuberculosis ~ 1960  . Type II diabetes mellitus ([facility])   . UTI (lower urinary tract infection)    "once; before  hysterectomy" (08/21/2018)    Assessment: 62 year old female s/p cath showing severe three-vessel coronary artery disease and reduced left ventricular systolic function. Plan to transfer to [name] for surgical consultation for CABG.  Hep gtt for ACS. rRestarted 2 hr post-[facility]. Now plan for CABG on 11/18. Last heparin level therapeutic at 0.44. CBC stable.  Goal of Therapy:  Heparin level 0.3-0.7 units/ml Monitor platelets by anticoagulation protocol: Yes   Plan:  Continue heparin gtt at 1,200 units/hr Monitor daily heparin level, CBC, s/s of bleed CABG planned on 11/18  Enzo BiNathan Illiana Losurdo, PharmD, BCPS Clinical Pharmacist Phone number [phone] 08/22/2018 10:55 AM

## 2018-08-23 ENCOUNTER — Inpatient Hospital Stay (HOSPITAL_COMMUNITY): Payer: Managed Care, Other (non HMO)

## 2018-08-23 DIAGNOSIS — Z01810 Encounter for preprocedural cardiovascular examination: Secondary | ICD-10-CM

## 2018-08-23 DIAGNOSIS — I25119 Atherosclerotic heart disease of native coronary artery with unspecified angina pectoris: Secondary | ICD-10-CM

## 2018-08-23 LAB — COMPREHENSIVE METABOLIC PANEL
ALT: 18 U/L (ref 0–44)
AST: 27 U/L (ref 15–41)
Albumin: 3.5 g/dL (ref 3.5–5.0)
Alkaline Phosphatase: 71 U/L (ref 38–126)
Anion gap: 9 (ref 5–15)
BUN: 10 mg/dL (ref 8–23)
CO2: 24 mmol/L (ref 22–32)
Calcium: 8.6 mg/dL — ABNORMAL LOW (ref 8.9–10.3)
Chloride: 107 mmol/L (ref 98–111)
Creatinine, Ser: 1.07 mg/dL — ABNORMAL HIGH (ref 0.44–1.00)
GFR calc Af Amer: >60 mL/min (ref >60)
GFR calc non Af Amer: 54 mL/min — ABNORMAL LOW (ref >60)
Glucose, Bld: 163 mg/dL — ABNORMAL HIGH (ref 70–99)
Potassium: 3.7 mmol/L (ref 3.5–5.1)
Sodium: 140 mmol/L (ref 135–145)
Total Bilirubin: 0.8 mg/dL (ref 0.3–1.2)
Total Protein: 6.7 g/dL (ref 6.5–8.1)

## 2018-08-23 LAB — CBC
HCT: 38.2 % (ref 36.0–46.0)
Hemoglobin: 12.4 g/dL (ref 12.0–15.0)
MCH: 29.2 pg (ref 26.0–34.0)
MCHC: 32.5 g/dL (ref 30.0–36.0)
MCV: 90.1 fL (ref 80.0–100.0)
Platelets: 207 10*3/uL (ref 150–400)
RBC: 4.24 MIL/uL (ref 3.87–5.11)
RDW: 12.8 % (ref 11.5–15.5)
WBC: 6.2 10*3/uL (ref 4.0–10.5)
nRBC: 0 % (ref 0.0–0.2)

## 2018-08-23 LAB — PROTIME-INR
INR: 0.98
Prothrombin Time: 12.9 seconds (ref 11.4–15.2)

## 2018-08-23 LAB — HEPARIN LEVEL (UNFRACTIONATED): Heparin Unfractionated: 0.57 IU/mL (ref 0.30–0.70)

## 2018-08-23 LAB — GLUCOSE, CAPILLARY
Glucose-Capillary: 178 mg/dL — ABNORMAL HIGH (ref 70–99)
Glucose-Capillary: 225 mg/dL — ABNORMAL HIGH (ref 70–99)
Glucose-Capillary: 237 mg/dL — ABNORMAL HIGH (ref 70–99)
Glucose-Capillary: 169 mg/dL — ABNORMAL HIGH (ref 70–99)

## 2018-08-23 LAB — TSH: TSH: 2.242 u[IU]/mL (ref 0.350–4.500)

## 2018-08-23 LAB — HEMOGLOBIN A1C
Hgb A1c MFr Bld: 8.5 % — ABNORMAL HIGH (ref 4.8–5.6)
Mean Plasma Glucose: 197.25 mg/dL

## 2018-08-23 MED ORDER — CIPROFLOXACIN HCL 500 MG PO TABS
500.0000 mg | ORAL_TABLET | Freq: Two times a day (BID) | ORAL | Status: DC
  Administered 2018-08-23 – 2018-08-25 (×5): 500 mg via ORAL
  Filled 2018-08-23 (×5): qty 1

## 2018-08-23 NOTE — Progress Notes (Signed)
Progress Note  Patient Name: Olivia Roth Date of Encounter: 08/23/2018  Primary Cardiologist: No primary care provider on file.   Subjective   No complaints this morning. She's anticipating CABG on Monday. Denies chest pain, SOB, palpitations, or constipation.   Inpatient Medications    Scheduled Meds: . aspirin EC  81 mg Oral Daily  . atorvastatin  80 mg Oral q1800  . carvedilol  3.125 mg Oral BID WC  . insulin aspart  0-5 Units Subcutaneous QHS  . insulin aspart  0-9 Units Subcutaneous TID WC   Continuous Infusions: . sodium chloride 500 mL (08/21/18 1955)  . heparin 1,200 Units/hr (08/22/18 1133)   PRN Meds: sodium chloride, acetaminophen, ondansetron (ZOFRAN) IV   Vital Signs    Vitals:   08/22/18 1612 08/22/18 2003 08/22/18 2342 08/23/18 0623  BP: (!) 116/57 122/63 127/71 126/82  Pulse: 84 84 79 78  Resp:  18    Temp: 98.5 F (36.9 C) 99.2 F (37.3 C) 98.9 F (37.2 C) 98.8 F (37.1 C)  TempSrc: Oral Oral Oral Oral  SpO2: 97% 98%  95%  Weight:    74 kg  Height:        Intake/Output Summary (Last 24 hours) at 08/23/2018 0748 Last data filed at 08/22/2018 2329 Gross per 24 hour  Intake 669.34 ml  Output -  Net 669.34 ml   Filed Weights   08/21/18 1750 08/22/18 0550 08/23/18 0623  Weight: 73.7 kg 73.8 kg 74 kg    Telemetry    NSR - Personally Reviewed  Physical Exam   GEN: Laying in bed in no acute distress.   Neck: No JVD, no carotid bruits Cardiac: RRR, no murmurs, rubs, or gallops.  Respiratory: Clear to auscultation bilaterally, no wheezes/ rales/ rhonchi GI: NABS, Soft, nontender, non-distended  MS: No edema; No deformity. Neuro:  Nonfocal, moving all extremities spontaneously Psych: Normal affect   Labs    Chemistry Recent Labs  Lab 08/21/18 0340  08/21/18 0351 08/22/18 0204 08/23/18 0448  NA 141   < > 139 138 140  K 3.5   < > 3.9 3.7 3.7  CL 106   < > 106 109 107  CO2 23  --   --  17* 24  GLUCOSE 234*   < > 242* 192*  163*  BUN 17   < > 19 14 10   CREATININE 0.81   < > 0.80 0.90 1.07*  CALCIUM 9.0  --   --  8.5* 8.6*  PROT 7.5  --   --   --  6.7  ALBUMIN 4.2  --   --   --  3.5  AST 24  --   --   --  27  ALT 19  --   --   --  18  ALKPHOS 75  --   --   --  71  BILITOT 0.9  --   --   --  0.8  GFRNONAA >60  --   --  >60 54*  GFRAA >60  --   --  >60 >60  ANIONGAP 12  --   --  12 9   < > = values in this interval not displayed.     Hematology Recent Labs  Lab 08/21/18 0340  08/21/18 0351 08/22/18 0204 08/23/18 0448  WBC 8.4  --   --  6.8 6.2  RBC 4.85  --   --  4.09 4.24  HGB 14.6   < > 15.0 12.3  12.4  HCT 43.7   < > 44.0 36.6 38.2  MCV 90.1  --   --  89.5 90.1  MCH 30.1  --   --  30.1 29.2  MCHC 33.4  --   --  33.6 32.5  RDW 12.7  --   --  12.9 12.8  PLT 255  --   --  199 207   < > = values in this interval not displayed.    Cardiac Enzymes Recent Labs  Lab 08/21/18 0626 08/21/18 1344 08/21/18 1906 08/22/18 0204  TROPONINI 0.29* 2.63* 2.57* 3.93*   No results for input(s): TROPIPOC in the last 168 hours.   BNP Recent Labs  Lab 08/21/18 0340  BNP 97.0     DDimer No results for input(s): DDIMER in the last 168 hours.   Radiology    No results found.  Cardiac Studies   Cardiac Cath 08/21/2018 Conclusions: 1. Severe three-vessel coronary artery disease, including 50% distal LMCA, 80% ostial/proximal LAD, and 90% ostial LCx, and 99% mid RCA stenoses. 2. Moderately to severely reduced left ventricular systolic function (LVEF 30-35%) with global hypokinesis and mid/apical akinesis. 3. Moderately elevated left ventricular filling pressure.  Recommendations: 1. Transfer to [facility] for surgical consultation for CABG. 2. Gentle diuresis. 3. Restart heparin infusion 2 hours after TR band deflation. 4. Aggressive secondary prevention and medical therapy. Will continue carvedilol 3.125 mg BID and increase atorvastatin to 80 mg daily.  Recommend uninterrupted dual  antiplatelet therapy with aspirin 81mg  daily and clopidogrel 75mg  daily for a minimum of 12 months (ACS - Class I recommendation). I will defer starting clopidogrel pending cardiac surgery consultation.  Echocardiogram 08/22/18: Study Conclusions  - Left ventricle: Systolic function was mildly to moderately   reduced. The estimated ejection fraction was in the range of 40%   to 45%. Moderate hypokinesis of the mid-apicalanteroseptal and   apical myocardium. Features are consistent with a pseudonormal   left ventricular filling pattern, with concomitant abnormal   relaxation and increased filling pressure (grade 2 diastolic   dysfunction). - Aortic valve: There was mild regurgitation.  Patient Profile   62 y.o. female with history of type 2 diabetes mellitus and obesity, who presented to [facility] with throat pain and found to have severe three-vessel CAD by cardiac cath.  She is now transferred to [facility] for surgical evaluation for CABG.  Assessment & Plan    1. NSTEMI in patient found to have multivessel CAD: patient presented with chest pain and found to have an NSTEMI. Trop peaked 3.93 . She underwent LHC 08/22/18 and was found to have severe multivessel disease. Transferred from [facility] to [facility] for CT Surgery evaluation - recommended for CABG, scheduled for Monday 08/26/18 with Dr. Donata Clay.  - Continue heparin gtt, aspirin, and statin - Continue coreg   2. Ischemic dilated cardiomyopathy: EF 40-45% on echo yesterday with G2DD. She was started on coreg BID. ARB not initiated yet given borderline BP and mildly rising Cr.  - Continue coreg - Would benefit from adding losartan/entresto - hopeful this can be started prior to discharge.   3. HLD: LDL 147; goal <70. Started on atorvastatin 80mg  daily - Continue statin  4. DM type 2: HgbA1C 8.4; goal <7 - Continue ISS  - Will need close outpatient follow-up with PCP for optimization of glycemic regimen   For questions or updates, please  contact [facility] Please consult [email or web address] for contact info under Cardiology/STEMI.      Signed, Ovidio Kin.  Shalana Jardin, PA-C  08/23/2018, 7:48 AM   [phone]

## 2018-08-23 NOTE — Progress Notes (Signed)
ANTICOAGULATION CONSULT NOTE  Pharmacy Consult for heparin Indication: chest pain/ACS  Allergies  Allergen Reactions  . Avocado   . Cortisone Other (See Comments)    drowsiness  . Pecan Nut (Diagnostic) Itching  . Shellfish Allergy Hives  . Advair Diskus [Fluticasone-Salmeterol] Palpitations  . Salmon [Fish Allergy] Rash    Rash and itching of ears    Patient Measurements: Height: 5\' 1"  (154.9 cm) Weight: 163 lb 1.6 oz (74 kg) IBW/kg (Calculated) : 47.8 Heparin Dosing Weight: 63.7 kg  Vital Signs: Temp: 98.8 F (37.1 C) (11/15 0623) Temp Source: Oral (11/15 0623) BP: 126/82 (11/15 0623) Pulse Rate: 78 (11/15 0623)  Labs: Recent Labs    08/21/18 0340  08/21/18 0351 08/21/18 0441  08/21/18 1344  08/21/18 1906 08/22/18 0204 08/22/18 0947 08/23/18 0448  HGB 14.6   < > 15.0  --   --   --   --   --  12.3  --  12.4  HCT 43.7   < > 44.0  --   --   --   --   --  36.6  --  38.2  PLT 255  --   --   --   --   --   --   --  199  --  207  APTT  --   --   --  30  --   --   --   --   --   --   --   LABPROT  --   --   --  13.3  --   --   --   --   --   --  12.9  INR  --   --   --  1.02  --   --   --   --   --   --  0.98  HEPARINUNFRC  --   --   --   --   --   --    < > <0.10* 0.15* 0.44 0.57  CREATININE 0.81   < > 0.80  --   --   --   --   --  0.90  --  1.07*  TROPONINI 0.14*  --   --   --    < > 2.63*  --  2.57* 3.93*  --   --    < > = values in this interval not displayed.    Estimated Creatinine Clearance: 49.5 mL/min (A) (by C-G formula based on SCr of 1.07 mg/dL (H)).   Medical History: Past Medical History:  Diagnosis Date  . Anxiety   . Asthma    In cold weather (08/21/2018)  . Family history of adverse reaction to anesthesia    "mother PONV; talked crazy/loud after anesthesia" (08/21/2018)  . GERD (gastroesophageal reflux disease)   . High cholesterol   . NSTEMI (non-ST elevated myocardial infarction) (HCC) 08/20/2018  . Seasonal allergies   . Tuberculosis ~  1960  . Type II diabetes mellitus (HCC)   . UTI (lower urinary tract infection)    "once; before hysterectomy" (08/21/2018)    Assessment: 62 year old female s/p cath showing severe three-vessel coronary artery disease and reduced left ventricular systolic function. [name].  Hep gtt for ACS. Restarted 2 hr post-[facility]. Now plan for CABG on 11/18. Last heparin level therapeutic at 0.57. CBC stable.  Goal of Therapy:  Heparin level 0.3-0.7 units/ml Monitor platelets by  anticoagulation protocol: Yes   Plan:  Continue heparin gtt at 1,200 units/hr Monitor daily heparin level, CBC, s/s of bleed CABG planned on 11/18  Enzo Bi, PharmD, BCPS Clinical Pharmacist Phone number [phone] 08/23/2018 8:12 AM

## 2018-08-23 NOTE — Progress Notes (Signed)
Procedure(s) (LRB): CORONARY ARTERY BYPASS GRAFTING (CABG) (N/A) TRANSESOPHAGEAL ECHOCARDIOGRAM (TEE) (N/A) Subjective: Patient stable on IV heparin Pre-CABG Doppler show no significant carotid disease, normal ABIs Chest x-ray is clear Hemoglobin A1c 8.5 UA shows probable UTI with positive nitrite, moderate WBC, positive bacteria.  Will start p.o. Cipro Plan multivessel CABG November 18 Objective: Vital signs in last 24 hours: Temp:  [98.5 F (36.9 C)-99.2 F (37.3 C)] 98.8 F (37.1 C) (11/15 0945) Pulse Rate:  [78-89] 89 (11/15 0945) Cardiac Rhythm: Normal sinus rhythm (11/15 0845) Resp:  [18] 18 (11/14 2003) BP: (116-127)/(57-82) 123/76 (11/15 0945) SpO2:  [95 %-98 %] 98 % (11/15 0945) Weight:  [74 kg] 74 kg (11/15 0623)  Hemodynamic parameters for last 24 hours:  Sinus rhythm  Intake/Output from previous day: 11/14 0701 - 11/15 0700 In: 669.3 [P.O.:600; I.V.:69.3] Out: -  Intake/Output this shift: No intake/output data recorded.       Exam    General- alert and comfortable    Neck- no JVD, no cervical adenopathy palpable, no carotid bruit   Lungs- clear without rales, wheezes   Cor- regular rate and rhythm, no murmur , gallop   Abdomen- soft, non-tender   Extremities - warm, non-tender, minimal edema   Neuro- oriented, appropriate, no focal weakness   Lab Results: Recent Labs    08/22/18 0204 08/23/18 0448  WBC 6.8 6.2  HGB 12.3 12.4  HCT 36.6 38.2  PLT 199 207   BMET:  Recent Labs    08/22/18 0204 08/23/18 0448  NA 138 140  K 3.7 3.7  CL 109 107  CO2 17* 24  GLUCOSE 192* 163*  BUN 14 10  CREATININE 0.90 1.07*  CALCIUM 8.5* 8.6*    PT/INR:  Recent Labs    08/23/18 0448  LABPROT 12.9  INR 0.98   ABG    Component Value Date/Time   TCO2 24 08/21/2018 0351   CBG (last 3)  Recent Labs    08/22/18 2139 08/23/18 0741 08/23/18 1112  GLUCAP 204* 178* 225*    Assessment/Plan: S/P Procedure(s) (LRB): CORONARY ARTERY BYPASS GRAFTING  (CABG) (N/A) TRANSESOPHAGEAL ECHOCARDIOGRAM (TEE) (N/A)  Plan multivessel CABG for non-STEMI, severe three-vessel CAD in a diabetic pattern  LOS: 2 days    Kathlee Nationseter Van Trigt III 08/23/2018

## 2018-08-23 NOTE — Progress Notes (Signed)
[id number] Did not walk with pt due to LM disease. Pt and son asked appropriate questions and answers given. Gave OHS booklet, care guide, and in the tube handout.  Discussed importance of IS and mobility after surgery. Pt demonstrated 1250 ml on IS correctly. Wrote down how to view pre op video. Pt has made arrangements for 24 hour care after discharge home.  Discussed sternal precautions. Will follow up after surgery. Luetta NuttingCharlene Chanze Teagle RN BSN 08/23/2018 11:57 AM

## 2018-08-23 NOTE — Progress Notes (Signed)
Pre-CABG testing has been completed. 1-39% ICA stenosis bilaterally.  Palmar waveforms Right Palmar waveforms are obliterated with radial and ulnar compression. Left Palmar waveforms are obliterated with radial and ulnar compression.  ABI's  Right 1.04 Left 1.01  08/23/18 10:14 AM Olen CordialGreg Morgaine Kimball RVT

## 2018-08-24 LAB — GLUCOSE, CAPILLARY
Glucose-Capillary: 197 mg/dL — ABNORMAL HIGH (ref 70–99)
Glucose-Capillary: 172 mg/dL — ABNORMAL HIGH (ref 70–99)
Glucose-Capillary: 216 mg/dL — ABNORMAL HIGH (ref 70–99)
Glucose-Capillary: 185 mg/dL — ABNORMAL HIGH (ref 70–99)

## 2018-08-24 LAB — CBC
HCT: 37.9 % (ref 36.0–46.0)
Hemoglobin: 12.1 g/dL (ref 12.0–15.0)
MCH: 28.9 pg (ref 26.0–34.0)
MCHC: 31.9 g/dL (ref 30.0–36.0)
MCV: 90.5 fL (ref 80.0–100.0)
Platelets: 190 10*3/uL (ref 150–400)
RBC: 4.19 MIL/uL (ref 3.87–5.11)
RDW: 12.9 % (ref 11.5–15.5)
WBC: 5.8 10*3/uL (ref 4.0–10.5)
nRBC: 0 % (ref 0.0–0.2)

## 2018-08-24 LAB — HEPARIN LEVEL (UNFRACTIONATED): Heparin Unfractionated: 0.55 IU/mL (ref 0.30–0.70)

## 2018-08-24 MED ORDER — MUPIROCIN 2 % EX OINT
1.0000 "application " | TOPICAL_OINTMENT | Freq: Two times a day (BID) | CUTANEOUS | Status: AC
  Administered 2018-08-24 – 2018-08-29 (×9): 1 via NASAL
  Filled 2018-08-24 (×2): qty 22

## 2018-08-24 MED ORDER — CHLORHEXIDINE GLUCONATE CLOTH 2 % EX PADS
6.0000 | MEDICATED_PAD | Freq: Every day | CUTANEOUS | Status: DC
  Administered 2018-08-24 – 2018-08-25 (×2): 6 via TOPICAL

## 2018-08-24 NOTE — Progress Notes (Signed)
Progress Note  Patient Name: Olivia MoselleMaria D Roth Date of Encounter: 08/24/2018  Primary Cardiologist: Yvonne Kendallhristopher End, MD   Subjective   Doing well.  She denies any chest pain or SOB.  Tele with NSR  Inpatient Medications    Scheduled Meds: . aspirin EC  81 mg Oral Daily  . atorvastatin  80 mg Oral q1800  . carvedilol  3.125 mg Oral BID WC  . ciprofloxacin  500 mg Oral BID  . insulin aspart  0-5 Units Subcutaneous QHS  . insulin aspart  0-9 Units Subcutaneous TID WC   Continuous Infusions: . sodium chloride 500 mL (08/21/18 1955)  . heparin 1,200 Units/hr (08/24/18 0418)   PRN Meds: sodium chloride, acetaminophen, ondansetron (ZOFRAN) IV   Vital Signs    Vitals:   08/23/18 2053 08/24/18 0017 08/24/18 0430 08/24/18 0756  BP: 127/80 118/67 123/64 121/61  Pulse: 77 79 75 75  Resp:   18   Temp: 98.7 F (37.1 C)  98 F (36.7 C)   TempSrc: Oral  Oral   SpO2: 95% 94% 96% 96%  Weight:   74.3 kg   Height:        Intake/Output Summary (Last 24 hours) at 08/24/2018 0824 Last data filed at 08/24/2018 0400 Gross per 24 hour  Intake 48 ml  Output -  Net 48 ml   Filed Weights   08/22/18 0550 08/23/18 0623 08/24/18 0430  Weight: 73.8 kg 74 kg 74.3 kg    Telemetry    NSR - Personally Reviewed  ECG    No new EKG to review - Personally Reviewed  Physical Exam   GEN: No acute distress.   Neck: No JVD Cardiac: RRR, no murmurs, rubs, or gallops.  Respiratory: Clear to auscultation bilaterally. GI: Soft, nontender, non-distended  MS: No edema; No deformity. Neuro:  Nonfocal  Psych: Normal affect   Labs    Chemistry Recent Labs  Lab 08/21/18 0340  08/21/18 0351 08/22/18 0204 08/23/18 0448  NA 141   < > 139 138 140  K 3.5   < > 3.9 3.7 3.7  CL 106   < > 106 109 107  CO2 23  --   --  17* 24  GLUCOSE 234*   < > 242* 192* 163*  BUN 17   < > 19 14 10   CREATININE 0.81   < > 0.80 0.90 1.07*  CALCIUM 9.0  --   --  8.5* 8.6*  PROT 7.5  --   --   --  6.7    ALBUMIN 4.2  --   --   --  3.5  AST 24  --   --   --  27  ALT 19  --   --   --  18  ALKPHOS 75  --   --   --  71  BILITOT 0.9  --   --   --  0.8  GFRNONAA >60  --   --  >60 54*  GFRAA >60  --   --  >60 >60  ANIONGAP 12  --   --  12 9   < > = values in this interval not displayed.     Hematology Recent Labs  Lab 08/22/18 0204 08/23/18 0448 08/24/18 0456  WBC 6.8 6.2 5.8  RBC 4.09 4.24 4.19  HGB 12.3 12.4 12.1  HCT 36.6 38.2 37.9  MCV 89.5 90.1 90.5  MCH 30.1 29.2 28.9  MCHC 33.6 32.5 31.9  RDW 12.9 12.8 12.9  PLT 199 207 190    Cardiac Enzymes Recent Labs  Lab 08/21/18 0626 08/21/18 1344 08/21/18 1906 08/22/18 0204  TROPONINI 0.29* 2.63* 2.57* 3.93*   No results for input(s): TROPIPOC in the last 168 hours.   BNP Recent Labs  Lab 08/21/18 0340  BNP 97.0     DDimer No results for input(s): DDIMER in the last 168 hours.   Radiology    Dg Chest 2 View  Result Date: 08/23/2018 CLINICAL DATA:  Shortness of breath. EXAM: CHEST - 2 VIEW COMPARISON:  08/21/2018 FINDINGS: The heart size and mediastinal contours are within normal limits. Both lungs are clear. The visualized skeletal structures are unremarkable. IMPRESSION: No active cardiopulmonary disease. Electronically Signed   By: Signa Kell M.D.   On: 08/23/2018 08:21    Cardiac Studies   Cardiac Cath 08/21/2018 Conclusions: 1. Severe three-vessel coronary artery disease, including 50% distal LMCA, 80% ostial/proximal LAD, and 90% ostial LCx, and 99% mid RCA stenoses. 2. Moderately to severely reduced left ventricular systolic function (LVEF 30-35%) with global hypokinesis and mid/apical akinesis. 3. Moderately elevated left ventricular filling pressure.  Recommendations: 1. Transfer to [facility] for surgical consultation for CABG. 2. Gentle diuresis. 3. Restart heparin infusion 2 hours after TR band deflation. 4. Aggressive secondary prevention and medical therapy. Will continue carvedilol 3.125  mg BID and increase atorvastatin to 80 mg daily.  Recommend uninterrupted dual antiplatelet therapy with aspirin 81mg  daily and clopidogrel 75mg  daily for a minimum of 12 months (ACS - Class I recommendation). I will defer starting clopidogrel pending cardiac surgery consultation.  Echocardiogram 08/22/18: Study Conclusions  - Left ventricle: Systolic function was mildly to moderately reduced. The estimated ejection fraction was in the range of 40% to 45%. Moderate hypokinesis of the mid-apicalanteroseptal and apical myocardium. Features are consistent with a pseudonormal left ventricular filling pattern, with concomitant abnormal relaxation and increased filling pressure (grade 2 diastolic dysfunction). - Aortic valve: There was mild regurgitation.  Patient Profile     62 y.o. female with history of type 2 diabetes mellitus and obesity, who presented to [facility] with throat painand found to have severe three-vessel CAD by cardiac cath. She is now transferred to [facility] for surgical evaluation for CABG.  Assessment & Plan    1. NSTEMI in patient found to have multivessel CAD - Trop peaked 3.93 thus far - repeat to make sure trending downward - Cath showed severe multivessel disease.  - Cath planned for Monday - Continue heparin gtt, aspirin, BB and statin  2. Ischemic dilated cardiomyopathy - EF 40-45% on echo with G2DD.  - Continue coreg - will hold on addition of ARB/Entresto until post CABG  3. HLD - LDL 147; goal <70.  - continue atorvastatin 80mg  daily -will need FLP and ALT in 6 weeks  4. DM type 2 - HgbA1C 8.4; goal <7 - Continue ISS  - Will need close outpatient follow-up with PCP for optimization of glycemic regimen   For questions or updates, please contact [facility] Please consult [email or web address] for contact info under Cardiology/STEMI.      Signed, Armanda Magic, MD  08/24/2018, 8:24 AM

## 2018-08-24 NOTE — Progress Notes (Signed)
ANTICOAGULATION CONSULT NOTE  Pharmacy Consult for heparin Indication: chest pain/ACS  Allergies  Allergen Reactions  . Avocado   . Cortisone Other (See Comments)    drowsiness  . Pecan Nut (Diagnostic) Itching  . Shellfish Allergy Hives  . Advair Diskus [Fluticasone-Salmeterol] Palpitations  . Salmon [Fish Allergy] Rash    Rash and itching of ears    Patient Measurements: Height: 5\' 1"  (154.9 cm) Weight: 163 lb 14.4 oz (74.3 kg) IBW/kg (Calculated) : 47.8 Heparin Dosing Weight: 63.7 kg  Vital Signs: Temp: 98.9 F (37.2 C) (11/16 1246) Temp Source: Oral (11/16 1246) BP: 127/50 (11/16 1246) Pulse Rate: 77 (11/16 1246)  Labs: Recent Labs    08/21/18 1344  08/21/18 1906  08/22/18 0204 08/22/18 0947 08/23/18 0448 08/24/18 0456  HGB  --   --   --    < > 12.3  --  12.4 12.1  HCT  --   --   --   --  36.6  --  38.2 37.9  PLT  --   --   --   --  199  --  207 190  LABPROT  --   --   --   --   --   --  12.9  --   INR  --   --   --   --   --   --  0.98  --   HEPARINUNFRC  --    < > <0.10*  --  0.15* 0.44 0.57 0.55  CREATININE  --   --   --   --  0.90  --  1.07*  --   TROPONINI 2.63*  --  2.57*  --  3.93*  --   --   --    < > = values in this interval not displayed.    Estimated Creatinine Clearance: 49.6 mL/min (A) (by C-G formula based on SCr of 1.07 mg/dL (H)).   Medical History: Past Medical History:  Diagnosis Date  . Anxiety   . Asthma    In cold weather (08/21/2018)  . Family history of adverse reaction to anesthesia    "mother PONV; talked crazy/loud after anesthesia" (08/21/2018)  . GERD (gastroesophageal reflux disease)   . High cholesterol   . NSTEMI (non-ST elevated myocardial infarction) (HCC) 08/20/2018  . Seasonal allergies   . Tuberculosis ~ 1960  . Type II diabetes mellitus (HCC)   . UTI (lower urinary tract infection)    "once; before hysterectomy" (08/21/2018)    Assessment: 62 year old female s/p cath showing severe three-vessel coronary  artery disease and reduced left ventricular systolic function. [name].  Plan for CABG on 11/18. Last heparin level therapeutic at 0.55. CBC stable.  Goal of Therapy:  Heparin level 0.3-0.7 units/ml Monitor platelets by anticoagulation protocol: Yes   Plan:  Continue heparin gtt at 1,200 units/hr Monitor daily heparin level, CBC, s/s of bleed CABG planned on 11/18  Jeanella Caraathy Rian Koon, PharmD, [facility] Clinical Pharmacist Please see AMION for all Pharmacists' Contact Phone Numbers 08/24/2018, 1:33 PM

## 2018-08-25 ENCOUNTER — Encounter (HOSPITAL_COMMUNITY): Payer: Self-pay | Admitting: Anesthesiology

## 2018-08-25 LAB — BLOOD GAS, ARTERIAL
Acid-Base Excess: 0.7 mmol/L (ref 0.0–2.0)
Bicarbonate: 24.6 mmol/L (ref 20.0–28.0)
Drawn by: 406621
FIO2: 21
O2 Saturation: 93.7 %
Patient temperature: 98.6
pCO2 arterial: 38.2 mmHg (ref 32.0–48.0)
pH, Arterial: 7.424 (ref 7.350–7.450)
pO2, Arterial: 69.1 mmHg — ABNORMAL LOW (ref 83.0–108.0)

## 2018-08-25 LAB — ABO/RH: ABO/RH(D): O POS

## 2018-08-25 LAB — GLUCOSE, CAPILLARY
Glucose-Capillary: 189 mg/dL — ABNORMAL HIGH (ref 70–99)
Glucose-Capillary: 221 mg/dL — ABNORMAL HIGH (ref 70–99)
Glucose-Capillary: 148 mg/dL — ABNORMAL HIGH (ref 70–99)
Glucose-Capillary: 157 mg/dL — ABNORMAL HIGH (ref 70–99)

## 2018-08-25 LAB — CBC
HCT: 36.2 % (ref 36.0–46.0)
Hemoglobin: 11.9 g/dL — ABNORMAL LOW (ref 12.0–15.0)
MCH: 29.8 pg (ref 26.0–34.0)
MCHC: 32.9 g/dL (ref 30.0–36.0)
MCV: 90.5 fL (ref 80.0–100.0)
Platelets: 171 10*3/uL (ref 150–400)
RBC: 4 MIL/uL (ref 3.87–5.11)
RDW: 12.8 % (ref 11.5–15.5)
WBC: 5.1 10*3/uL (ref 4.0–10.5)
nRBC: 0 % (ref 0.0–0.2)

## 2018-08-25 LAB — PREPARE RBC (CROSSMATCH)

## 2018-08-25 LAB — HEPARIN LEVEL (UNFRACTIONATED): Heparin Unfractionated: 0.48 IU/mL (ref 0.30–0.70)

## 2018-08-25 MED ORDER — DEXMEDETOMIDINE HCL IN NACL 400 MCG/100ML IV SOLN
0.1000 ug/kg/h | INTRAVENOUS | Status: DC
  Filled 2018-08-25: qty 100

## 2018-08-25 MED ORDER — POTASSIUM CHLORIDE 2 MEQ/ML IV SOLN
80.0000 meq | INTRAVENOUS | Status: DC
  Filled 2018-08-25: qty 40

## 2018-08-25 MED ORDER — ALPRAZOLAM 0.25 MG PO TABS: 0.2500 mg | ORAL_TABLET | ORAL | Status: DC | PRN

## 2018-08-25 MED ORDER — CHLORHEXIDINE GLUCONATE 4 % EX LIQD
60.0000 mL | Freq: Once | CUTANEOUS | Status: AC
  Administered 2018-08-25: 4 via TOPICAL
  Filled 2018-08-25: qty 60

## 2018-08-25 MED ORDER — DOPAMINE-DEXTROSE 3.2-5 MG/ML-% IV SOLN
0.0000 ug/kg/min | INTRAVENOUS | Status: DC
  Filled 2018-08-25: qty 250

## 2018-08-25 MED ORDER — VANCOMYCIN HCL 10 G IV SOLR
1250.0000 mg | INTRAVENOUS | Status: AC
  Administered 2018-08-26: 1250 mg via INTRAVENOUS
  Filled 2018-08-25: qty 1250

## 2018-08-25 MED ORDER — SODIUM CHLORIDE 0.9 % IV SOLN
1.5000 g | INTRAVENOUS | Status: AC
  Administered 2018-08-26: 1.5 g via INTRAVENOUS
  Filled 2018-08-25: qty 1.5

## 2018-08-25 MED ORDER — CHLORHEXIDINE GLUCONATE 4 % EX LIQD
60.0000 mL | Freq: Once | CUTANEOUS | Status: AC
  Administered 2018-08-26: 4 via TOPICAL
  Filled 2018-08-25: qty 60

## 2018-08-25 MED ORDER — TEMAZEPAM 15 MG PO CAPS: 15.0000 mg | ORAL_CAPSULE | Freq: Once | ORAL | Status: DC | PRN

## 2018-08-25 MED ORDER — TRANEXAMIC ACID 1000 MG/10ML IV SOLN
1.5000 mg/kg/h | INTRAVENOUS | Status: AC
  Administered 2018-08-26: 1.5 mg/kg/h via INTRAVENOUS
  Filled 2018-08-25: qty 25

## 2018-08-25 MED ORDER — DIAZEPAM 2 MG PO TABS
2.0000 mg | ORAL_TABLET | Freq: Once | ORAL | Status: AC
  Administered 2018-08-26: 2 mg via ORAL
  Filled 2018-08-25: qty 1

## 2018-08-25 MED ORDER — PLASMA-LYTE 148 IV SOLN
INTRAVENOUS | Status: AC
  Administered 2018-08-26: 400 mL
  Filled 2018-08-25: qty 2.5

## 2018-08-25 MED ORDER — MAGNESIUM SULFATE 50 % IJ SOLN
40.0000 meq | INTRAMUSCULAR | Status: DC
  Filled 2018-08-25: qty 9.85

## 2018-08-25 MED ORDER — EPINEPHRINE PF 1 MG/ML IJ SOLN
0.0000 ug/min | INTRAVENOUS | Status: DC
  Filled 2018-08-25: qty 4

## 2018-08-25 MED ORDER — NOREPINEPHRINE 4 MG/250ML-% IV SOLN
0.0000 ug/min | INTRAVENOUS | Status: DC
  Filled 2018-08-25: qty 250

## 2018-08-25 MED ORDER — TRANEXAMIC ACID (OHS) BOLUS VIA INFUSION
15.0000 mg/kg | INTRAVENOUS | Status: AC
  Administered 2018-08-26: 1111.5 mg via INTRAVENOUS
  Filled 2018-08-25: qty 1112

## 2018-08-25 MED ORDER — SODIUM CHLORIDE 0.9 % IV SOLN
750.0000 mg | INTRAVENOUS | Status: DC
  Filled 2018-08-25: qty 750

## 2018-08-25 MED ORDER — PHENYLEPHRINE HCL-NACL 20-0.9 MG/250ML-% IV SOLN
30.0000 ug/min | INTRAVENOUS | Status: DC
  Filled 2018-08-25: qty 250

## 2018-08-25 MED ORDER — MILRINONE LACTATE IN DEXTROSE 20-5 MG/100ML-% IV SOLN
0.3000 ug/kg/min | INTRAVENOUS | Status: DC
  Filled 2018-08-25: qty 100

## 2018-08-25 MED ORDER — NITROGLYCERIN IN D5W 200-5 MCG/ML-% IV SOLN
2.0000 ug/min | INTRAVENOUS | Status: DC
  Filled 2018-08-25: qty 250

## 2018-08-25 MED ORDER — TRANEXAMIC ACID (OHS) PUMP PRIME SOLUTION
2.0000 mg/kg | INTRAVENOUS | Status: DC
  Filled 2018-08-25: qty 1.48

## 2018-08-25 MED ORDER — SODIUM CHLORIDE 0.9 % IV SOLN
INTRAVENOUS | Status: DC
  Filled 2018-08-25: qty 30

## 2018-08-25 MED ORDER — METOPROLOL TARTRATE 12.5 MG HALF TABLET
12.5000 mg | ORAL_TABLET | Freq: Once | ORAL | Status: AC
  Administered 2018-08-26: 12.5 mg via ORAL
  Filled 2018-08-25: qty 1

## 2018-08-25 MED ORDER — BISACODYL 5 MG PO TBEC
5.0000 mg | DELAYED_RELEASE_TABLET | Freq: Once | ORAL | Status: AC
  Administered 2018-08-25: 5 mg via ORAL
  Filled 2018-08-25: qty 1

## 2018-08-25 MED ORDER — CHLORHEXIDINE GLUCONATE 0.12 % MT SOLN
15.0000 mL | Freq: Once | OROMUCOSAL | Status: AC
  Administered 2018-08-26: 15 mL via OROMUCOSAL
  Filled 2018-08-25: qty 15

## 2018-08-25 MED ORDER — INSULIN REGULAR(HUMAN) IN NACL 100-0.9 UT/100ML-% IV SOLN
INTRAVENOUS | Status: DC
  Filled 2018-08-25: qty 100

## 2018-08-25 NOTE — Progress Notes (Signed)
Progress Note  Patient Name: Olivia Roth Date of Encounter: 08/25/2018  Primary Cardiologist: Yvonne Kendallhristopher End, MD   Subjective   No angina ready for CABG in am  Inpatient Medications    Scheduled Meds: . aspirin EC  81 mg Oral Daily  . atorvastatin  80 mg Oral q1800  . bisacodyl  5 mg Oral Once  . carvedilol  3.125 mg Oral BID WC  . chlorhexidine  60 mL Topical Once   And  . [START ON 08/26/2018] chlorhexidine  60 mL Topical Once  . [START ON 08/26/2018] chlorhexidine  15 mL Mouth/Throat Once  . Chlorhexidine Gluconate Cloth  6 each Topical Daily  . ciprofloxacin  500 mg Oral BID  . [START ON 08/26/2018] diazepam  2 mg Oral Once  . insulin aspart  0-5 Units Subcutaneous QHS  . insulin aspart  0-9 Units Subcutaneous TID WC  . [START ON 08/26/2018] metoprolol tartrate  12.5 mg Oral Once  . mupirocin ointment  1 application Nasal BID   Continuous Infusions: . sodium chloride 500 mL (08/21/18 1955)  . heparin 1,200 Units/hr (08/25/18 0406)   PRN Meds: sodium chloride, acetaminophen, ALPRAZolam, ondansetron (ZOFRAN) IV, temazepam   Vital Signs    Vitals:   08/24/18 1622 08/24/18 2003 08/25/18 0012 08/25/18 0515  BP: 129/68 (!) 127/58 135/61 109/60  Pulse: 79 77 76 84  Resp:  19 20 20   Temp: 98.3 F (36.8 C) 98.7 F (37.1 C) 98.2 F (36.8 C) 98.1 F (36.7 C)  TempSrc: Oral Oral Oral Oral  SpO2: 97% 97% 94% 92%  Weight:    74.1 kg  Height:        Intake/Output Summary (Last 24 hours) at 08/25/2018 0816 Last data filed at 08/25/2018 0600 Gross per 24 hour  Intake 120.12 ml  Output -  Net 120.12 ml   Filed Weights   08/23/18 0623 08/24/18 0430 08/25/18 0515  Weight: 74 kg 74.3 kg 74.1 kg    Telemetry    NSR - Personally Reviewed  ECG    NSR deep anterolateral T Wave inversions  Physical Exam   Affect appropriate Healthy:  appears stated age HEENT: normal Neck supple with no adenopathy JVP normal no bruits no thyromegaly Lungs clear with  no wheezing and good diaphragmatic motion Heart:  S1/S2 no murmur, no rub, gallop or click PMI normal Abdomen: benighn, BS positve, no tenderness, no AAA no bruit.  No HSM or HJR Distal pulses intact with no bruits No edema Neuro non-focal Skin warm and dry No muscular weakness   Labs    Chemistry Recent Labs  Lab 08/21/18 0340  08/21/18 0351 08/22/18 0204 08/23/18 0448  NA 141   < > 139 138 140  K 3.5   < > 3.9 3.7 3.7  CL 106   < > 106 109 107  CO2 23  --   --  17* 24  GLUCOSE 234*   < > 242* 192* 163*  BUN 17   < > 19 14 10   CREATININE 0.81   < > 0.80 0.90 1.07*  CALCIUM 9.0  --   --  8.5* 8.6*  PROT 7.5  --   --   --  6.7  ALBUMIN 4.2  --   --   --  3.5  AST 24  --   --   --  27  ALT 19  --   --   --  18  ALKPHOS 75  --   --   --  71  BILITOT 0.9  --   --   --  0.8  GFRNONAA >60  --   --  >60 54*  GFRAA >60  --   --  >60 >60  ANIONGAP 12  --   --  12 9   < > = values in this interval not displayed.     Hematology Recent Labs  Lab 08/23/18 0448 08/24/18 0456 08/25/18 0505  WBC 6.2 5.8 5.1  RBC 4.24 4.19 4.00  HGB 12.4 12.1 11.9*  HCT 38.2 37.9 36.2  MCV 90.1 90.5 90.5  MCH 29.2 28.9 29.8  MCHC 32.5 31.9 32.9  RDW 12.8 12.9 12.8  PLT 207 190 171    Cardiac Enzymes Recent Labs  Lab 08/21/18 0626 08/21/18 1344 08/21/18 1906 08/22/18 0204  TROPONINI 0.29* 2.63* 2.57* 3.93*   No results for input(s): TROPIPOC in the last 168 hours.   BNP Recent Labs  Lab 08/21/18 0340  BNP 97.0     DDimer No results for input(s): DDIMER in the last 168 hours.   Radiology    No results found.  Cardiac Studies   Cardiac Cath 08/21/2018 Conclusions: 1. Severe three-vessel coronary artery disease, including 50% distal LMCA, 80% ostial/proximal LAD, and 90% ostial LCx, and 99% mid RCA stenoses. 2. Moderately to severely reduced left ventricular systolic function (LVEF 30-35%) with global hypokinesis and mid/apical akinesis. 3. Moderately elevated left  ventricular filling pressure.  Recommendations: 1. Transfer to [facility] for surgical consultation for CABG. 2. Gentle diuresis. 3. Restart heparin infusion 2 hours after TR band deflation. 4. Aggressive secondary prevention and medical therapy. Will continue carvedilol 3.125 mg BID and increase atorvastatin to 80 mg daily.  Recommend uninterrupted dual antiplatelet therapy with aspirin 81mg  daily and clopidogrel 75mg  daily for a minimum of 12 months (ACS - Class I recommendation). I will defer starting clopidogrel pending cardiac surgery consultation.  Echocardiogram 08/22/18: Study Conclusions  - Left ventricle: Systolic function was mildly to moderately reduced. The estimated ejection fraction was in the range of 40% to 45%. Moderate hypokinesis of the mid-apicalanteroseptal and apical myocardium. Features are consistent with a pseudonormal left ventricular filling pattern, with concomitant abnormal relaxation and increased filling pressure (grade 2 diastolic dysfunction). - Aortic valve: There was mild regurgitation.  Patient Profile     62 y.o. female with history of type 2 diabetes mellitus and obesity, who presented [facility] with throat painand found to have severe three-vessel CAD by cardiac cath. She is now transferred to [facility] for surgical evaluation for CABG.  Assessment & Plan    1. NSTEMI in patient found to have multivessel CAD - Trop peaked 3.93 thus far - repeat to make sure trending downward - Cath showed severe multivessel disease.  - CABG with PVT first case for MOnday  - Continue heparin gtt, aspirin, BB and statin  2. Ischemic dilated cardiomyopathy - EF 40-45% on echo with G2DD.  - Continue coreg - will hold on addition of ARB/Entresto until post CABG  3. HLD - LDL 147; goal <70.  - continue atorvastatin 80mg  daily -will need FLP and ALT in 6 weeks  4. DM type 2 - HgbA1C 8.4; goal <7 - Continue ISS  - Will need close  outpatient follow-up with PCP for optimization of glycemic regimen   For questions or updates, please contact [facility] Please consult [email or web address] for contact info under Cardiology/STEMI.      Signed, Charlton Haws, MD  08/25/2018, 8:16 AM

## 2018-08-25 NOTE — Anesthesia Preprocedure Evaluation (Addendum)
Anesthesia Evaluation  Patient identified by MRN, date of birth, ID band Patient awake    Reviewed: Allergy & Precautions, NPO status , Patient's Chart, lab work & pertinent test results  Airway Mallampati: I  TM Distance: >3 FB Neck ROM: Full    Dental  (+) Teeth Intact, Dental Advisory Given   Pulmonary asthma ,    breath sounds clear to auscultation       Cardiovascular hypertension, + CAD   Rhythm:Regular Rate:Normal     Neuro/Psych Anxiety negative neurological ROS     GI/Hepatic Neg liver ROS, GERD  ,  Endo/Other  diabetes, Type 2, Oral Hypoglycemic Agents  Renal/GU negative Renal ROS     Musculoskeletal negative musculoskeletal ROS (+)   Abdominal Normal abdominal exam  (+)   Peds  Hematology negative hematology ROS (+)   Anesthesia Other Findings   Reproductive/Obstetrics                            Lab Results  Component Value Date   WBC 5.1 08/25/2018   HGB 11.9 (L) 08/25/2018   HCT 36.2 08/25/2018   MCV 90.5 08/25/2018   PLT 171 08/25/2018   Lab Results  Component Value Date   CREATININE 1.07 (H) 08/23/2018   BUN 10 08/23/2018   NA 140 08/23/2018   K 3.7 08/23/2018   CL 107 08/23/2018   CO2 24 08/23/2018   Lab Results  Component Value Date   INR 0.98 08/23/2018   INR 1.02 08/21/2018   EKG: NSR  Echo: - Left ventricle: Systolic function was mildly to moderately   reduced. The estimated ejection fraction was in the range of 40%   to 45%. Moderate hypokinesis of the mid-apicalanteroseptal and   apical myocardium. Features are consistent with a pseudonormal   left ventricular filling pattern, with concomitant abnormal   relaxation and increased filling pressure (grade 2 diastolic   dysfunction). - Aortic valve: There was mild regurgitation.  Anesthesia Physical Anesthesia Plan  ASA: IV  Anesthesia Plan: General   Post-op Pain Management:    Induction:  Intravenous  PONV Risk Score and Plan: Ondansetron  Airway Management Planned: Oral ETT  Additional Equipment: Arterial line, CVP, PA Cath, TEE and Ultrasound Guidance Line Placement  Intra-op Plan:   Post-operative Plan: Post-operative intubation/ventilation  Informed Consent: I have reviewed the patients History and Physical, chart, labs and discussed the procedure including the risks, benefits and alternatives for the proposed anesthesia with the patient or authorized representative who has indicated his/her understanding and acceptance.   Dental advisory given  Plan Discussed with: CRNA  Anesthesia Plan Comments:        Anesthesia Quick Evaluation

## 2018-08-25 NOTE — Progress Notes (Signed)
ANTICOAGULATION CONSULT NOTE  Pharmacy Consult for heparin Indication: chest pain/ACS  Allergies  Allergen Reactions  . Shellfish Allergy Hives  . Avocado     UNSPECIFIED REACTION   . Advair Diskus [Fluticasone-Salmeterol] Palpitations  . Cortisone Other (See Comments)    drowsiness  . Pecan Nut (Diagnostic) Itching  . Salmon [Fish Allergy] Rash    Rash and itching of ears    Patient Measurements: Height: 5\' 1"  (154.9 cm) Weight: 163 lb 6.4 oz (74.1 kg) IBW/kg (Calculated) : 47.8 Heparin Dosing Weight: 63.7 kg  Vital Signs: Temp: 98.1 F (36.7 C) (11/17 0823) Temp Source: Oral (11/17 0823) BP: 129/63 (11/17 0823) Pulse Rate: 71 (11/17 0823)  Labs: Recent Labs    08/23/18 0448 08/24/18 0456 08/25/18 0505  HGB 12.4 12.1 11.9*  HCT 38.2 37.9 36.2  PLT 207 190 171  LABPROT 12.9  --   --   INR 0.98  --   --   HEPARINUNFRC 0.57 0.55 0.48  CREATININE 1.07*  --   --     Estimated Creatinine Clearance: 49.5 mL/min (A) (by C-G formula based on SCr of 1.07 mg/dL (H)).   Medical History: Past Medical History:  Diagnosis Date  . Anxiety   . Asthma    In cold weather (08/21/2018)  . Family history of adverse reaction to anesthesia    "mother PONV; talked crazy/loud after anesthesia" (08/21/2018)  . GERD (gastroesophageal reflux disease)   . High cholesterol   . NSTEMI (non-ST elevated myocardial infarction) (HCC) 08/20/2018  . Seasonal allergies   . Tuberculosis ~ 1960  . Type II diabetes mellitus (HCC)   . UTI (lower urinary tract infection)    "once; before hysterectomy" (08/21/2018)    Assessment: 62 year old female s/p cath showing severe three-vessel coronary artery disease and reduced left ventricular systolic function. [name]  Plan for CABG on 11/18. Last heparin level therapeutic at 0.48. CBC stable.  Goal of Therapy:  Heparin level 0.3-0.7 units/ml Monitor platelets by anticoagulation  protocol: Yes   Plan:  Continue heparin gtt at 1,200 units/hr Monitor daily heparin level, CBC, s/s of bleed CABG planned on 11/18  Jeanella Caraathy Chrisha Vogel, PharmD, [facility] Clinical Pharmacist Please see AMION for all Pharmacists' Contact Phone Numbers 08/25/2018, 11:07 AM

## 2018-08-25 NOTE — Progress Notes (Signed)
   08/25/18 0900  Clinical Encounter Type  Visited With Patient and family together  Visit Type Initial  Responded to [facility] consult for AD. Met with patient and she was sitting up in bed with a family member president. Explained to AD process and said we can notarize on Monday. She informed me that she is having surgery at 4:00 am in the morning and wanted it done before that. I said in that case we have an emergency procedure and I will expedite immediately. She asked if a friend who is a notary could notarize and I said certainly. She said in that case she does not need our services and thanked me.

## 2018-08-26 ENCOUNTER — Inpatient Hospital Stay (HOSPITAL_COMMUNITY): Payer: Managed Care, Other (non HMO) | Admitting: Certified Registered Nurse Anesthetist

## 2018-08-26 ENCOUNTER — Inpatient Hospital Stay (HOSPITAL_COMMUNITY)
Admission: AD | Disposition: A | Discharge status: Home / Self Care | Payer: Self-pay | Source: Other Acute Inpatient Hospital | Attending: Cardiothoracic Surgery

## 2018-08-26 ENCOUNTER — Inpatient Hospital Stay (HOSPITAL_COMMUNITY): Payer: Managed Care, Other (non HMO)

## 2018-08-26 ENCOUNTER — Encounter (HOSPITAL_COMMUNITY): Payer: Self-pay | Admitting: *Deleted

## 2018-08-26 DIAGNOSIS — Z951 Presence of aortocoronary bypass graft: Secondary | ICD-10-CM

## 2018-08-26 DIAGNOSIS — I2510 Atherosclerotic heart disease of native coronary artery without angina pectoris: Secondary | ICD-10-CM | POA: Diagnosis present

## 2018-08-26 HISTORY — PX: CORONARY ARTERY BYPASS GRAFT: SHX141

## 2018-08-26 HISTORY — PX: TEE WITHOUT CARDIOVERSION: SHX5443

## 2018-08-26 LAB — GLUCOSE, CAPILLARY
Glucose-Capillary: 128 mg/dL — ABNORMAL HIGH (ref 70–99)
Glucose-Capillary: 141 mg/dL — ABNORMAL HIGH (ref 70–99)
Glucose-Capillary: 132 mg/dL — ABNORMAL HIGH (ref 70–99)
Glucose-Capillary: 164 mg/dL — ABNORMAL HIGH (ref 70–99)
Glucose-Capillary: 145 mg/dL — ABNORMAL HIGH (ref 70–99)
Glucose-Capillary: 83 mg/dL (ref 70–99)

## 2018-08-26 LAB — POCT I-STAT 3, ART BLOOD GAS (G3+)
Acid-Base Excess: 3 mmol/L — ABNORMAL HIGH (ref 0.0–2.0)
Acid-base deficit: 1 mmol/L (ref 0.0–2.0)
Acid-base deficit: 2 mmol/L (ref 0.0–2.0)
Acid-base deficit: 4 mmol/L — ABNORMAL HIGH (ref 0.0–2.0)
Acid-base deficit: 3 mmol/L — ABNORMAL HIGH (ref 0.0–2.0)
Bicarbonate: 27 mmol/L (ref 20.0–28.0)
Bicarbonate: 24.3 mmol/L (ref 20.0–28.0)
Bicarbonate: 22.3 mmol/L (ref 20.0–28.0)
Bicarbonate: 21.2 mmol/L (ref 20.0–28.0)
Bicarbonate: 22.7 mmol/L (ref 20.0–28.0)
O2 Saturation: 100 %
O2 Saturation: 100 %
O2 Saturation: 99 %
O2 Saturation: 98 %
O2 Saturation: 97 %
Patient temperature: 35.7
Patient temperature: 37
Patient temperature: 37.3
TCO2: 28 mmol/L (ref 22–32)
TCO2: 26 mmol/L (ref 22–32)
TCO2: 23 mmol/L (ref 22–32)
TCO2: 22 mmol/L (ref 22–32)
TCO2: 24 mmol/L (ref 22–32)
pCO2 arterial: 36.6 mmHg (ref 32.0–48.0)
pCO2 arterial: 40.1 mmHg (ref 32.0–48.0)
pCO2 arterial: 33.5 mmHg (ref 32.0–48.0)
pCO2 arterial: 36.3 mmHg (ref 32.0–48.0)
pCO2 arterial: 42.7 mmHg (ref 32.0–48.0)
pH, Arterial: 7.476 — ABNORMAL HIGH (ref 7.350–7.450)
pH, Arterial: 7.391 (ref 7.350–7.450)
pH, Arterial: 7.426 (ref 7.350–7.450)
pH, Arterial: 7.374 (ref 7.350–7.450)
pH, Arterial: 7.335 — ABNORMAL LOW (ref 7.350–7.450)
pO2, Arterial: 408 mmHg — ABNORMAL HIGH (ref 83.0–108.0)
pO2, Arterial: 356 mmHg — ABNORMAL HIGH (ref 83.0–108.0)
pO2, Arterial: 112 mmHg — ABNORMAL HIGH (ref 83.0–108.0)
pO2, Arterial: 105 mmHg (ref 83.0–108.0)
pO2, Arterial: 99 mmHg (ref 83.0–108.0)

## 2018-08-26 LAB — POCT I-STAT, CHEM 8
BUN: 11 mg/dL (ref 8–23)
BUN: 10 mg/dL (ref 8–23)
BUN: 8 mg/dL (ref 8–23)
BUN: 8 mg/dL (ref 8–23)
BUN: 9 mg/dL (ref 8–23)
BUN: 8 mg/dL (ref 8–23)
BUN: 7 mg/dL — ABNORMAL LOW (ref 8–23)
Calcium, Ion: 1.2 mmol/L (ref 1.15–1.40)
Calcium, Ion: 0.94 mmol/L — ABNORMAL LOW (ref 1.15–1.40)
Calcium, Ion: 1.2 mmol/L (ref 1.15–1.40)
Calcium, Ion: 0.99 mmol/L — ABNORMAL LOW (ref 1.15–1.40)
Calcium, Ion: 1 mmol/L — ABNORMAL LOW (ref 1.15–1.40)
Calcium, Ion: 1.07 mmol/L — ABNORMAL LOW (ref 1.15–1.40)
Calcium, Ion: 1.15 mmol/L (ref 1.15–1.40)
Chloride: 106 mmol/L (ref 98–111)
Chloride: 102 mmol/L (ref 98–111)
Chloride: 107 mmol/L (ref 98–111)
Chloride: 102 mmol/L (ref 98–111)
Chloride: 102 mmol/L (ref 98–111)
Chloride: 104 mmol/L (ref 98–111)
Chloride: 106 mmol/L (ref 98–111)
Creatinine, Ser: 0.6 mg/dL (ref 0.44–1.00)
Creatinine, Ser: 0.4 mg/dL — ABNORMAL LOW (ref 0.44–1.00)
Creatinine, Ser: 0.6 mg/dL (ref 0.44–1.00)
Creatinine, Ser: 0.6 mg/dL (ref 0.44–1.00)
Creatinine, Ser: 0.5 mg/dL (ref 0.44–1.00)
Creatinine, Ser: 0.5 mg/dL (ref 0.44–1.00)
Creatinine, Ser: 0.7 mg/dL (ref 0.44–1.00)
Glucose, Bld: 195 mg/dL — ABNORMAL HIGH (ref 70–99)
Glucose, Bld: 132 mg/dL — ABNORMAL HIGH (ref 70–99)
Glucose, Bld: 164 mg/dL — ABNORMAL HIGH (ref 70–99)
Glucose, Bld: 135 mg/dL — ABNORMAL HIGH (ref 70–99)
Glucose, Bld: 158 mg/dL — ABNORMAL HIGH (ref 70–99)
Glucose, Bld: 171 mg/dL — ABNORMAL HIGH (ref 70–99)
Glucose, Bld: 144 mg/dL — ABNORMAL HIGH (ref 70–99)
HCT: 33 % — ABNORMAL LOW (ref 36.0–46.0)
HCT: 26 % — ABNORMAL LOW (ref 36.0–46.0)
HCT: 30 % — ABNORMAL LOW (ref 36.0–46.0)
HCT: 22 % — ABNORMAL LOW (ref 36.0–46.0)
HCT: 26 % — ABNORMAL LOW (ref 36.0–46.0)
HCT: 28 % — ABNORMAL LOW (ref 36.0–46.0)
HCT: 29 % — ABNORMAL LOW (ref 36.0–46.0)
Hemoglobin: 11.2 g/dL — ABNORMAL LOW (ref 12.0–15.0)
Hemoglobin: 10.2 g/dL — ABNORMAL LOW (ref 12.0–15.0)
Hemoglobin: 8.8 g/dL — ABNORMAL LOW (ref 12.0–15.0)
Hemoglobin: 7.5 g/dL — ABNORMAL LOW (ref 12.0–15.0)
Hemoglobin: 8.8 g/dL — ABNORMAL LOW (ref 12.0–15.0)
Hemoglobin: 9.5 g/dL — ABNORMAL LOW (ref 12.0–15.0)
Hemoglobin: 9.9 g/dL — ABNORMAL LOW (ref 12.0–15.0)
Potassium: 3.9 mmol/L (ref 3.5–5.1)
Potassium: 3.4 mmol/L — ABNORMAL LOW (ref 3.5–5.1)
Potassium: 3.9 mmol/L (ref 3.5–5.1)
Potassium: 4.6 mmol/L (ref 3.5–5.1)
Potassium: 4.6 mmol/L (ref 3.5–5.1)
Potassium: 3.6 mmol/L (ref 3.5–5.1)
Potassium: 4.1 mmol/L (ref 3.5–5.1)
Sodium: 140 mmol/L (ref 135–145)
Sodium: 141 mmol/L (ref 135–145)
Sodium: 143 mmol/L (ref 135–145)
Sodium: 138 mmol/L (ref 135–145)
Sodium: 139 mmol/L (ref 135–145)
Sodium: 141 mmol/L (ref 135–145)
Sodium: 140 mmol/L (ref 135–145)
TCO2: 26 mmol/L (ref 22–32)
TCO2: 25 mmol/L (ref 22–32)
TCO2: 28 mmol/L (ref 22–32)
TCO2: 27 mmol/L (ref 22–32)
TCO2: 27 mmol/L (ref 22–32)
TCO2: 25 mmol/L (ref 22–32)
TCO2: 24 mmol/L (ref 22–32)

## 2018-08-26 LAB — CBC
HCT: 37.2 % (ref 36.0–46.0)
HCT: 28 % — ABNORMAL LOW (ref 36.0–46.0)
HCT: 30 % — ABNORMAL LOW (ref 36.0–46.0)
Hemoglobin: 12.3 g/dL (ref 12.0–15.0)
Hemoglobin: 9.1 g/dL — ABNORMAL LOW (ref 12.0–15.0)
Hemoglobin: 10.1 g/dL — ABNORMAL LOW (ref 12.0–15.0)
MCH: 29.9 pg (ref 26.0–34.0)
MCH: 29.5 pg (ref 26.0–34.0)
MCH: 30.3 pg (ref 26.0–34.0)
MCHC: 33.1 g/dL (ref 30.0–36.0)
MCHC: 32.5 g/dL (ref 30.0–36.0)
MCHC: 33.7 g/dL (ref 30.0–36.0)
MCV: 90.3 fL (ref 80.0–100.0)
MCV: 90.9 fL (ref 80.0–100.0)
MCV: 90.1 fL (ref 80.0–100.0)
Platelets: 199 10*3/uL (ref 150–400)
Platelets: 109 10*3/uL — ABNORMAL LOW (ref 150–400)
Platelets: 167 10*3/uL (ref 150–400)
RBC: 4.12 MIL/uL (ref 3.87–5.11)
RBC: 3.08 MIL/uL — ABNORMAL LOW (ref 3.87–5.11)
RBC: 3.33 MIL/uL — ABNORMAL LOW (ref 3.87–5.11)
RDW: 12.9 % (ref 11.5–15.5)
RDW: 13.3 % (ref 11.5–15.5)
RDW: 13.5 % (ref 11.5–15.5)
WBC: 5.5 10*3/uL (ref 4.0–10.5)
WBC: 6.2 10*3/uL (ref 4.0–10.5)
WBC: 8.5 10*3/uL (ref 4.0–10.5)
nRBC: 0 % (ref 0.0–0.2)
nRBC: 0 % (ref 0.0–0.2)
nRBC: 0 % (ref 0.0–0.2)

## 2018-08-26 LAB — HEMOGLOBIN AND HEMATOCRIT, BLOOD
HCT: 25.5 % — ABNORMAL LOW (ref 36.0–46.0)
Hemoglobin: 8.5 g/dL — ABNORMAL LOW (ref 12.0–15.0)

## 2018-08-26 LAB — HEPARIN LEVEL (UNFRACTIONATED): Heparin Unfractionated: 0.56 IU/mL (ref 0.30–0.70)

## 2018-08-26 LAB — CK: Total CK: 253 U/L — ABNORMAL HIGH (ref 38–234)

## 2018-08-26 LAB — POCT I-STAT 4, (NA,K, GLUC, HGB,HCT)
Glucose, Bld: 128 mg/dL — ABNORMAL HIGH (ref 70–99)
HCT: 24 % — ABNORMAL LOW (ref 36.0–46.0)
Hemoglobin: 8.2 g/dL — ABNORMAL LOW (ref 12.0–15.0)
Potassium: 3.6 mmol/L (ref 3.5–5.1)
Sodium: 143 mmol/L (ref 135–145)

## 2018-08-26 LAB — BASIC METABOLIC PANEL
Anion gap: 9 (ref 5–15)
BUN: 13 mg/dL (ref 8–23)
CO2: 23 mmol/L (ref 22–32)
Calcium: 8.9 mg/dL (ref 8.9–10.3)
Chloride: 106 mmol/L (ref 98–111)
Creatinine, Ser: 0.91 mg/dL (ref 0.44–1.00)
GFR calc Af Amer: >60 mL/min (ref >60)
GFR calc non Af Amer: >60 mL/min (ref >60)
Glucose, Bld: 185 mg/dL — ABNORMAL HIGH (ref 70–99)
Potassium: 3.6 mmol/L (ref 3.5–5.1)
Sodium: 138 mmol/L (ref 135–145)

## 2018-08-26 LAB — PREPARE RBC (CROSSMATCH)

## 2018-08-26 LAB — PROTIME-INR
INR: 1.32
Prothrombin Time: 16.3 seconds — ABNORMAL HIGH (ref 11.4–15.2)

## 2018-08-26 LAB — PLATELET COUNT: Platelets: 118 10*3/uL — ABNORMAL LOW (ref 150–400)

## 2018-08-26 LAB — APTT: aPTT: 29 seconds (ref 24–36)

## 2018-08-26 LAB — MAGNESIUM: Magnesium: 2.8 mg/dL — ABNORMAL HIGH (ref 1.7–2.4)

## 2018-08-26 SURGERY — CORONARY ARTERY BYPASS GRAFTING (CABG)
Anesthesia: General | Site: Esophagus

## 2018-08-26 MED ORDER — ROCURONIUM BROMIDE 50 MG/5ML IV SOSY
PREFILLED_SYRINGE | INTRAVENOUS | Status: AC
  Filled 2018-08-26: qty 5

## 2018-08-26 MED ORDER — TRAMADOL HCL 50 MG PO TABS
50.0000 mg | ORAL_TABLET | ORAL | Status: DC | PRN
  Administered 2018-08-26: 50 mg via ORAL
  Administered 2018-08-26 – 2018-08-27 (×2): 100 mg via ORAL
  Administered 2018-08-27: 50 mg via ORAL
  Administered 2018-08-27 – 2018-08-28 (×2): 100 mg via ORAL
  Filled 2018-08-26 (×3): qty 2
  Filled 2018-08-26: qty 1
  Filled 2018-08-26: qty 2
  Filled 2018-08-26: qty 1

## 2018-08-26 MED ORDER — SODIUM CHLORIDE 0.9% FLUSH: 10.0000 mL | INTRAVENOUS | Status: DC | PRN

## 2018-08-26 MED ORDER — METOPROLOL TARTRATE 25 MG/10 ML ORAL SUSPENSION: 12.5000 mg | Freq: Two times a day (BID) | ORAL | Status: DC

## 2018-08-26 MED ORDER — MIDAZOLAM HCL (PF) 10 MG/2ML IJ SOLN
INTRAMUSCULAR | Status: AC
  Filled 2018-08-26: qty 2

## 2018-08-26 MED ORDER — PROTAMINE SULFATE 10 MG/ML IV SOLN
INTRAVENOUS | Status: DC | PRN
  Administered 2018-08-26: 270 mg via INTRAVENOUS

## 2018-08-26 MED ORDER — MILRINONE LACTATE IN DEXTROSE 20-5 MG/100ML-% IV SOLN
INTRAVENOUS | Status: DC | PRN
  Administered 2018-08-26: 0.25 ug/kg/min via INTRAVENOUS

## 2018-08-26 MED ORDER — DOCUSATE SODIUM 100 MG PO CAPS
200.0000 mg | ORAL_CAPSULE | Freq: Every day | ORAL | Status: DC
  Administered 2018-08-27 – 2018-08-30 (×3): 200 mg via ORAL
  Filled 2018-08-26 (×4): qty 2

## 2018-08-26 MED ORDER — ACETAMINOPHEN 650 MG RE SUPP
650.0000 mg | Freq: Once | RECTAL | Status: AC
  Administered 2018-08-26: 650 mg via RECTAL

## 2018-08-26 MED ORDER — VANCOMYCIN HCL IN DEXTROSE 1-5 GM/200ML-% IV SOLN
1000.0000 mg | Freq: Once | INTRAVENOUS | Status: AC
  Administered 2018-08-26: 1000 mg via INTRAVENOUS
  Filled 2018-08-26: qty 200

## 2018-08-26 MED ORDER — CHLORHEXIDINE GLUCONATE CLOTH 2 % EX PADS
6.0000 | MEDICATED_PAD | Freq: Every day | CUTANEOUS | Status: DC
  Administered 2018-08-26 – 2018-08-30 (×5): 6 via TOPICAL

## 2018-08-26 MED ORDER — ALBUMIN HUMAN 5 % IV SOLN: 250.0000 mL | INTRAVENOUS | Status: AC | PRN

## 2018-08-26 MED ORDER — INSULIN REGULAR BOLUS VIA INFUSION
0.0000 [IU] | Freq: Three times a day (TID) | INTRAVENOUS | Status: DC
  Filled 2018-08-26: qty 10

## 2018-08-26 MED ORDER — ARTIFICIAL TEARS OPHTHALMIC OINT
TOPICAL_OINTMENT | OPHTHALMIC | Status: DC | PRN
  Administered 2018-08-26: 1 via OPHTHALMIC

## 2018-08-26 MED ORDER — PHENYLEPHRINE HCL-NACL 20-0.9 MG/250ML-% IV SOLN
0.0000 ug/min | INTRAVENOUS | Status: DC
  Filled 2018-08-26: qty 250

## 2018-08-26 MED ORDER — POTASSIUM CHLORIDE 10 MEQ/50ML IV SOLN
10.0000 meq | INTRAVENOUS | Status: AC
  Administered 2018-08-26 (×3): 10 meq via INTRAVENOUS

## 2018-08-26 MED ORDER — OXYCODONE HCL 5 MG PO TABS: 5.0000 mg | ORAL_TABLET | ORAL | Status: DC | PRN

## 2018-08-26 MED ORDER — SODIUM CHLORIDE 0.9 % IV SOLN
INTRAVENOUS | Status: DC | PRN
  Administered 2018-08-26: 750 mg via INTRAVENOUS

## 2018-08-26 MED ORDER — METOCLOPRAMIDE HCL 5 MG/ML IJ SOLN
10.0000 mg | Freq: Four times a day (QID) | INTRAMUSCULAR | Status: DC
  Administered 2018-08-26 – 2018-08-28 (×7): 10 mg via INTRAVENOUS
  Filled 2018-08-26 (×7): qty 2

## 2018-08-26 MED ORDER — SODIUM CHLORIDE 0.9 % IV SOLN
INTRAVENOUS | Status: DC | PRN
  Administered 2018-08-26: 4.1 [IU]/h via INTRAVENOUS

## 2018-08-26 MED ORDER — ACETAMINOPHEN 160 MG/5ML PO SOLN: 650.0000 mg | Freq: Once | ORAL | Status: AC

## 2018-08-26 MED ORDER — LACTATED RINGERS IV SOLN
INTRAVENOUS | Status: DC | PRN
  Administered 2018-08-26: 07:00:00 via INTRAVENOUS

## 2018-08-26 MED ORDER — SODIUM CHLORIDE 0.45 % IV SOLN: INTRAVENOUS | Status: DC | PRN

## 2018-08-26 MED ORDER — DIPHENHYDRAMINE HCL 50 MG/ML IJ SOLN
INTRAMUSCULAR | Status: DC | PRN
  Administered 2018-08-26: 12.5 mg via INTRAVENOUS

## 2018-08-26 MED ORDER — CHLORHEXIDINE GLUCONATE 0.12% ORAL RINSE (MEDLINE KIT)
15.0000 mL | Freq: Two times a day (BID) | OROMUCOSAL | Status: DC
  Administered 2018-08-26 – 2018-08-27 (×2): 15 mL via OROMUCOSAL

## 2018-08-26 MED ORDER — LACTATED RINGERS IV SOLN
INTRAVENOUS | Status: DC | PRN
  Administered 2018-08-26 (×2): via INTRAVENOUS

## 2018-08-26 MED ORDER — PROPOFOL 10 MG/ML IV BOLUS
INTRAVENOUS | Status: DC | PRN
  Administered 2018-08-26: 90 mg via INTRAVENOUS

## 2018-08-26 MED ORDER — ORAL CARE MOUTH RINSE
15.0000 mL | OROMUCOSAL | Status: DC
  Administered 2018-08-26 – 2018-08-27 (×5): 15 mL via OROMUCOSAL

## 2018-08-26 MED ORDER — PROTAMINE SULFATE 10 MG/ML IV SOLN
INTRAVENOUS | Status: AC
  Filled 2018-08-26: qty 25

## 2018-08-26 MED ORDER — ARTIFICIAL TEARS OPHTHALMIC OINT
TOPICAL_OINTMENT | OPHTHALMIC | Status: AC
  Filled 2018-08-26: qty 3.5

## 2018-08-26 MED ORDER — SODIUM CHLORIDE 0.9 % IV SOLN
INTRAVENOUS | Status: DC | PRN
  Administered 2018-08-26: 25 ug/min via INTRAVENOUS

## 2018-08-26 MED ORDER — PROPOFOL 10 MG/ML IV BOLUS
INTRAVENOUS | Status: AC
  Filled 2018-08-26: qty 20

## 2018-08-26 MED ORDER — 0.9 % SODIUM CHLORIDE (POUR BTL) OPTIME
TOPICAL | Status: DC | PRN
  Administered 2018-08-26: 5000 mL

## 2018-08-26 MED ORDER — ROCURONIUM BROMIDE 10 MG/ML (PF) SYRINGE
PREFILLED_SYRINGE | INTRAVENOUS | Status: DC | PRN
  Administered 2018-08-26 (×4): 50 mg via INTRAVENOUS

## 2018-08-26 MED ORDER — ALBUMIN HUMAN 5 % IV SOLN
INTRAVENOUS | Status: DC | PRN
  Administered 2018-08-26 (×2): via INTRAVENOUS

## 2018-08-26 MED ORDER — FAMOTIDINE IN NACL 20-0.9 MG/50ML-% IV SOLN
20.0000 mg | Freq: Two times a day (BID) | INTRAVENOUS | Status: AC
  Administered 2018-08-26 (×2): 20 mg via INTRAVENOUS
  Filled 2018-08-26: qty 50

## 2018-08-26 MED ORDER — HEMOSTATIC AGENTS (NO CHARGE) OPTIME
TOPICAL | Status: DC | PRN
  Administered 2018-08-26: 1 via TOPICAL

## 2018-08-26 MED ORDER — MIDAZOLAM HCL 2 MG/2ML IJ SOLN: 2.0000 mg | INTRAMUSCULAR | Status: DC | PRN

## 2018-08-26 MED ORDER — SODIUM CHLORIDE (PF) 0.9 % IJ SOLN
INTRAMUSCULAR | Status: AC
  Filled 2018-08-26: qty 20

## 2018-08-26 MED ORDER — BISACODYL 10 MG RE SUPP: 10.0000 mg | Freq: Every day | RECTAL | Status: DC

## 2018-08-26 MED ORDER — SODIUM CHLORIDE 0.9% FLUSH
10.0000 mL | Freq: Two times a day (BID) | INTRAVENOUS | Status: DC
  Administered 2018-08-26 – 2018-08-31 (×6): 10 mL

## 2018-08-26 MED ORDER — MAGNESIUM SULFATE 4 GM/100ML IV SOLN
4.0000 g | Freq: Once | INTRAVENOUS | Status: AC
  Administered 2018-08-26: 4 g via INTRAVENOUS
  Filled 2018-08-26: qty 100

## 2018-08-26 MED ORDER — PROTAMINE SULFATE 10 MG/ML IV SOLN
INTRAVENOUS | Status: AC
  Filled 2018-08-26: qty 5

## 2018-08-26 MED ORDER — DEXMEDETOMIDINE HCL IN NACL 200 MCG/50ML IV SOLN
INTRAVENOUS | Status: DC | PRN
  Administered 2018-08-26: .3 ug/kg/h via INTRAVENOUS

## 2018-08-26 MED ORDER — ACETAMINOPHEN 160 MG/5ML PO SOLN: 1000.0000 mg | Freq: Four times a day (QID) | ORAL | Status: DC

## 2018-08-26 MED ORDER — MORPHINE SULFATE (PF) 2 MG/ML IV SOLN: 2.0000 mg | INTRAVENOUS | Status: DC | PRN

## 2018-08-26 MED ORDER — SODIUM CHLORIDE 0.9% FLUSH: 3.0000 mL | Freq: Two times a day (BID) | INTRAVENOUS | Status: DC

## 2018-08-26 MED ORDER — SODIUM CHLORIDE 0.9 % IV SOLN
INTRAVENOUS | Status: DC
  Administered 2018-08-26: 15:00:00 via INTRAVENOUS

## 2018-08-26 MED ORDER — CHLORHEXIDINE GLUCONATE 0.12 % MT SOLN
15.0000 mL | OROMUCOSAL | Status: AC
  Administered 2018-08-26: 15 mL via OROMUCOSAL

## 2018-08-26 MED ORDER — DEXMEDETOMIDINE HCL IN NACL 200 MCG/50ML IV SOLN
0.0000 ug/kg/h | INTRAVENOUS | Status: DC
  Administered 2018-08-26: 0.5 ug/kg/h via INTRAVENOUS

## 2018-08-26 MED ORDER — PHENYLEPHRINE HCL 10 MG/ML IJ SOLN
INTRAMUSCULAR | Status: DC | PRN
  Administered 2018-08-26: 80 ug via INTRAVENOUS

## 2018-08-26 MED ORDER — FENTANYL CITRATE (PF) 250 MCG/5ML IJ SOLN
INTRAMUSCULAR | Status: AC
  Filled 2018-08-26: qty 25

## 2018-08-26 MED ORDER — SODIUM CHLORIDE 0.9 % IV SOLN
1.5000 g | Freq: Two times a day (BID) | INTRAVENOUS | Status: AC
  Administered 2018-08-26 – 2018-08-28 (×4): 1.5 g via INTRAVENOUS
  Filled 2018-08-26 (×4): qty 1.5

## 2018-08-26 MED ORDER — INSULIN REGULAR(HUMAN) IN NACL 100-0.9 UT/100ML-% IV SOLN
INTRAVENOUS | Status: DC
  Filled 2018-08-26: qty 100

## 2018-08-26 MED ORDER — SODIUM CHLORIDE 0.9 % IV SOLN: 250.0000 mL | INTRAVENOUS | Status: DC

## 2018-08-26 MED ORDER — BISACODYL 5 MG PO TBEC
10.0000 mg | DELAYED_RELEASE_TABLET | Freq: Every day | ORAL | Status: DC
  Administered 2018-08-27 – 2018-08-28 (×2): 10 mg via ORAL
  Filled 2018-08-26 (×4): qty 2

## 2018-08-26 MED ORDER — PHENYLEPHRINE 40 MCG/ML (10ML) SYRINGE FOR IV PUSH (FOR BLOOD PRESSURE SUPPORT)
PREFILLED_SYRINGE | INTRAVENOUS | Status: AC
  Filled 2018-08-26: qty 10

## 2018-08-26 MED ORDER — HEMOSTATIC AGENTS (NO CHARGE) OPTIME
TOPICAL | Status: DC | PRN
  Administered 2018-08-26: 3 via TOPICAL

## 2018-08-26 MED ORDER — ACETAMINOPHEN 500 MG PO TABS
1000.0000 mg | ORAL_TABLET | Freq: Four times a day (QID) | ORAL | Status: DC
  Administered 2018-08-26 – 2018-08-31 (×18): 1000 mg via ORAL
  Filled 2018-08-26 (×19): qty 2

## 2018-08-26 MED ORDER — SODIUM CHLORIDE 0.9% FLUSH: 3.0000 mL | INTRAVENOUS | Status: DC | PRN

## 2018-08-26 MED ORDER — LACTATED RINGERS IV SOLN: 500.0000 mL | Freq: Once | INTRAVENOUS | Status: DC | PRN

## 2018-08-26 MED ORDER — ASPIRIN 81 MG PO CHEW: 324.0000 mg | CHEWABLE_TABLET | Freq: Every day | ORAL | Status: DC

## 2018-08-26 MED ORDER — MILRINONE LACTATE IN DEXTROSE 20-5 MG/100ML-% IV SOLN
0.1250 ug/kg/min | INTRAVENOUS | Status: DC
  Administered 2018-08-26 – 2018-08-27 (×2): 0.25 ug/kg/min via INTRAVENOUS
  Administered 2018-08-29: 0.125 ug/kg/min via INTRAVENOUS
  Filled 2018-08-26 (×3): qty 100

## 2018-08-26 MED ORDER — NITROGLYCERIN IN D5W 200-5 MCG/ML-% IV SOLN
INTRAVENOUS | Status: DC | PRN
  Administered 2018-08-26: 10 ug/min via INTRAVENOUS

## 2018-08-26 MED ORDER — CALCIUM CHLORIDE 10 % IV SOLN
INTRAVENOUS | Status: AC
  Filled 2018-08-26: qty 10

## 2018-08-26 MED ORDER — DIPHENHYDRAMINE HCL 50 MG/ML IJ SOLN
INTRAMUSCULAR | Status: AC
  Filled 2018-08-26: qty 1

## 2018-08-26 MED ORDER — EPINEPHRINE PF 1 MG/ML IJ SOLN
INTRAVENOUS | Status: DC | PRN
  Administered 2018-08-26: 2 ug/min via INTRAVENOUS

## 2018-08-26 MED ORDER — PANTOPRAZOLE SODIUM 40 MG PO TBEC
40.0000 mg | DELAYED_RELEASE_TABLET | Freq: Every day | ORAL | Status: DC
  Administered 2018-08-28 – 2018-08-31 (×4): 40 mg via ORAL
  Filled 2018-08-26 (×4): qty 1

## 2018-08-26 MED ORDER — NITROGLYCERIN IN D5W 200-5 MCG/ML-% IV SOLN: 0.0000 ug/min | INTRAVENOUS | Status: DC

## 2018-08-26 MED ORDER — ROCURONIUM BROMIDE 50 MG/5ML IV SOSY
PREFILLED_SYRINGE | INTRAVENOUS | Status: AC
  Filled 2018-08-26: qty 15

## 2018-08-26 MED ORDER — HEPARIN SODIUM (PORCINE) 1000 UNIT/ML IJ SOLN
INTRAMUSCULAR | Status: DC | PRN
  Administered 2018-08-26: 24000 [IU] via INTRAVENOUS
  Administered 2018-08-26: 2000 [IU] via INTRAVENOUS

## 2018-08-26 MED ORDER — LACTATED RINGERS IV SOLN: INTRAVENOUS | Status: DC

## 2018-08-26 MED ORDER — FENTANYL CITRATE (PF) 100 MCG/2ML IJ SOLN
INTRAMUSCULAR | Status: DC | PRN
  Administered 2018-08-26: 100 ug via INTRAVENOUS
  Administered 2018-08-26: 200 ug via INTRAVENOUS
  Administered 2018-08-26: 50 ug via INTRAVENOUS
  Administered 2018-08-26: 100 ug via INTRAVENOUS
  Administered 2018-08-26: 50 ug via INTRAVENOUS
  Administered 2018-08-26: 150 ug via INTRAVENOUS
  Administered 2018-08-26: 50 ug via INTRAVENOUS
  Administered 2018-08-26 (×4): 100 ug via INTRAVENOUS

## 2018-08-26 MED ORDER — ONDANSETRON HCL 4 MG/2ML IJ SOLN: 4.0000 mg | Freq: Four times a day (QID) | INTRAMUSCULAR | Status: DC | PRN

## 2018-08-26 MED ORDER — CALCIUM CHLORIDE 10 % IV SOLN
INTRAVENOUS | Status: DC | PRN
  Administered 2018-08-26: 100 mg via INTRAVENOUS
  Administered 2018-08-26: 200 mg via INTRAVENOUS
  Administered 2018-08-26 (×3): 100 mg via INTRAVENOUS

## 2018-08-26 MED ORDER — ONDANSETRON HCL 4 MG/2ML IJ SOLN
INTRAMUSCULAR | Status: DC | PRN
  Administered 2018-08-26: 4 mg via INTRAVENOUS

## 2018-08-26 MED ORDER — HEPARIN SODIUM (PORCINE) 1000 UNIT/ML IJ SOLN
INTRAMUSCULAR | Status: AC
  Filled 2018-08-26: qty 1

## 2018-08-26 MED ORDER — ONDANSETRON HCL 4 MG/2ML IJ SOLN
INTRAMUSCULAR | Status: AC
  Filled 2018-08-26: qty 2

## 2018-08-26 MED ORDER — METOPROLOL TARTRATE 5 MG/5ML IV SOLN: 2.5000 mg | INTRAVENOUS | Status: DC | PRN

## 2018-08-26 MED ORDER — LACTATED RINGERS IV SOLN
INTRAVENOUS | Status: DC
  Administered 2018-08-27: 10 mL/h via INTRAVENOUS

## 2018-08-26 MED ORDER — MIDAZOLAM HCL 5 MG/5ML IJ SOLN
INTRAMUSCULAR | Status: DC | PRN
  Administered 2018-08-26 (×4): 1 mg via INTRAVENOUS
  Administered 2018-08-26 (×3): 2 mg via INTRAVENOUS

## 2018-08-26 MED ORDER — METOPROLOL TARTRATE 12.5 MG HALF TABLET
12.5000 mg | ORAL_TABLET | Freq: Two times a day (BID) | ORAL | Status: DC
  Administered 2018-08-27: 12.5 mg via ORAL
  Filled 2018-08-26: qty 1

## 2018-08-26 MED ORDER — ASPIRIN EC 325 MG PO TBEC
325.0000 mg | DELAYED_RELEASE_TABLET | Freq: Every day | ORAL | Status: DC
  Administered 2018-08-27 – 2018-08-31 (×5): 325 mg via ORAL
  Filled 2018-08-26 (×5): qty 1

## 2018-08-26 MED ORDER — MORPHINE SULFATE (PF) 2 MG/ML IV SOLN: 1.0000 mg | INTRAVENOUS | Status: DC | PRN

## 2018-08-26 SURGICAL SUPPLY — 105 items
ADAPTER CARDIO PERF ANTE/RETRO (ADAPTER) ×4 IMPLANT
BAG DECANTER FOR FLEXI CONT (MISCELLANEOUS) ×4 IMPLANT
BANDAGE ACE 4X5 VEL STRL LF (GAUZE/BANDAGES/DRESSINGS) ×4 IMPLANT
BANDAGE ACE 6X5 VEL STRL LF (GAUZE/BANDAGES/DRESSINGS) ×4 IMPLANT
BANDAGE ELASTIC 4 VELCRO ST LF (GAUZE/BANDAGES/DRESSINGS) ×4 IMPLANT
BANDAGE ELASTIC 6 VELCRO ST LF (GAUZE/BANDAGES/DRESSINGS) ×4 IMPLANT
BASKET HEART  (ORDER IN 25'S) (MISCELLANEOUS) ×1
BASKET HEART (ORDER IN 25'S) (MISCELLANEOUS) ×1
BASKET HEART (ORDER IN 25S) (MISCELLANEOUS) ×2 IMPLANT
BINDER BREAST XLRG (GAUZE/BANDAGES/DRESSINGS) ×4 IMPLANT
BLADE CLIPPER SURG (BLADE) IMPLANT
BLADE STERNUM SYSTEM 6 (BLADE) ×4 IMPLANT
BLADE SURG 12 STRL SS (BLADE) ×4 IMPLANT
BNDG GAUZE ELAST 4 BULKY (GAUZE/BANDAGES/DRESSINGS) ×4 IMPLANT
CANISTER SUCT 3000ML PPV (MISCELLANEOUS) ×4 IMPLANT
CANNULA GUNDRY RCSP 15FR (MISCELLANEOUS) ×4 IMPLANT
CATH CPB KIT VANTRIGT (MISCELLANEOUS) ×4 IMPLANT
CATH ROBINSON RED A/P 18FR (CATHETERS) ×12 IMPLANT
CATH THORACIC 36FR RT ANG (CATHETERS) ×4 IMPLANT
CLIP FOGARTY SPRING 6M (CLIP) ×4 IMPLANT
CLIP VESOCCLUDE SM WIDE 24/CT (CLIP) ×4 IMPLANT
COVER WAND RF STERILE (DRAPES) ×4 IMPLANT
CRADLE DONUT ADULT HEAD (MISCELLANEOUS) ×4 IMPLANT
DERMABOND ADVANCED (GAUZE/BANDAGES/DRESSINGS) ×2
DERMABOND ADVANCED .7 DNX12 (GAUZE/BANDAGES/DRESSINGS) ×2 IMPLANT
DRAIN CHANNEL 32F RND 10.7 FF (WOUND CARE) ×4 IMPLANT
DRAPE CARDIOVASCULAR INCISE (DRAPES) ×2
DRAPE SLUSH/WARMER DISC (DRAPES) ×4 IMPLANT
DRAPE SRG 135X102X78XABS (DRAPES) ×2 IMPLANT
DRSG AQUACEL AG ADV 3.5X14 (GAUZE/BANDAGES/DRESSINGS) ×4 IMPLANT
ELECT BLADE 4.0 EZ CLEAN MEGAD (MISCELLANEOUS) ×4
ELECT BLADE 6.5 EXT (BLADE) ×4 IMPLANT
ELECT CAUTERY BLADE 6.4 (BLADE) ×4 IMPLANT
ELECT REM PT RETURN 9FT ADLT (ELECTROSURGICAL) ×8
ELECTRODE BLDE 4.0 EZ CLN MEGD (MISCELLANEOUS) ×2 IMPLANT
ELECTRODE REM PT RTRN 9FT ADLT (ELECTROSURGICAL) ×4 IMPLANT
FELT TEFLON 1X6 (MISCELLANEOUS) ×8 IMPLANT
GAUZE SPONGE 4X4 12PLY STRL (GAUZE/BANDAGES/DRESSINGS) ×8 IMPLANT
GAUZE SPONGE 4X4 12PLY STRL LF (GAUZE/BANDAGES/DRESSINGS) ×8 IMPLANT
GLOVE BIO SURGEON STRL SZ7.5 (GLOVE) ×12 IMPLANT
GLOVE BIOGEL PI IND STRL 6.5 (GLOVE) ×8 IMPLANT
GLOVE BIOGEL PI IND STRL 7.0 (GLOVE) ×8 IMPLANT
GLOVE BIOGEL PI INDICATOR 6.5 (GLOVE) ×8
GLOVE BIOGEL PI INDICATOR 7.0 (GLOVE) ×8
GOWN STRL REUS W/ TWL LRG LVL3 (GOWN DISPOSABLE) ×8 IMPLANT
GOWN STRL REUS W/TWL LRG LVL3 (GOWN DISPOSABLE) ×8
HEMOSTAT POWDER SURGIFOAM 1G (HEMOSTASIS) ×12 IMPLANT
HEMOSTAT SURGICEL 2X14 (HEMOSTASIS) ×4 IMPLANT
INSERT FOGARTY XLG (MISCELLANEOUS) IMPLANT
KIT BASIN OR (CUSTOM PROCEDURE TRAY) ×4 IMPLANT
KIT SUCTION CATH 14FR (SUCTIONS) ×4 IMPLANT
KIT TURNOVER KIT B (KITS) ×4 IMPLANT
KIT VASOVIEW HEMOPRO 2 VH 4000 (KITS) ×4 IMPLANT
LEAD PACING MYOCARDI (MISCELLANEOUS) ×4 IMPLANT
MARKER GRAFT CORONARY BYPASS (MISCELLANEOUS) ×8 IMPLANT
NS IRRIG 1000ML POUR BTL (IV SOLUTION) ×20 IMPLANT
PACK E OPEN HEART (SUTURE) ×4 IMPLANT
PACK OPEN HEART (CUSTOM PROCEDURE TRAY) ×4 IMPLANT
PAD ARMBOARD 7.5X6 YLW CONV (MISCELLANEOUS) ×8 IMPLANT
PAD ELECT DEFIB RADIOL ZOLL (MISCELLANEOUS) ×4 IMPLANT
PENCIL BUTTON HOLSTER BLD 10FT (ELECTRODE) ×4 IMPLANT
PUNCH AORTIC ROTATE 4.0MM (MISCELLANEOUS) ×4 IMPLANT
PUNCH AORTIC ROTATE 4.5MM 8IN (MISCELLANEOUS) IMPLANT
PUNCH AORTIC ROTATE 5MM 8IN (MISCELLANEOUS) IMPLANT
SENSOR MYOCARDIAL TEMP (MISCELLANEOUS) ×4 IMPLANT
SET CARDIOPLEGIA MPS 5001102 (MISCELLANEOUS) ×4 IMPLANT
SPONGE LAP 18X18 RF (DISPOSABLE) ×4 IMPLANT
SPONGE LAP 4X18 RFD (DISPOSABLE) ×4 IMPLANT
SURGIFLO W/THROMBIN 8M KIT (HEMOSTASIS) ×4 IMPLANT
SUT BONE WAX W31G (SUTURE) ×4 IMPLANT
SUT MNCRL AB 3-0 PS2 18 (SUTURE) ×8 IMPLANT
SUT MNCRL AB 4-0 PS2 18 (SUTURE) ×4 IMPLANT
SUT PROLENE 3 0 SH 48 (SUTURE) ×4 IMPLANT
SUT PROLENE 3 0 SH DA (SUTURE) ×4 IMPLANT
SUT PROLENE 3 0 SH1 36 (SUTURE) IMPLANT
SUT PROLENE 4 0 RB 1 (SUTURE) ×2
SUT PROLENE 4 0 SH DA (SUTURE) ×4 IMPLANT
SUT PROLENE 4-0 RB1 .5 CRCL 36 (SUTURE) ×2 IMPLANT
SUT PROLENE 5 0 C 1 36 (SUTURE) IMPLANT
SUT PROLENE 6 0 C 1 30 (SUTURE) ×4 IMPLANT
SUT PROLENE 6 0 CC (SUTURE) ×16 IMPLANT
SUT PROLENE 8 0 BV175 6 (SUTURE) ×16 IMPLANT
SUT PROLENE BLUE 7 0 (SUTURE) ×8 IMPLANT
SUT PROLENE POLY MONO (SUTURE) ×8 IMPLANT
SUT SILK  1 MH (SUTURE) ×6
SUT SILK 1 MH (SUTURE) ×6 IMPLANT
SUT SILK 2 0 SH CR/8 (SUTURE) ×4 IMPLANT
SUT SILK 3 0 SH CR/8 (SUTURE) IMPLANT
SUT STEEL 6MS V (SUTURE) ×4 IMPLANT
SUT STEEL SZ 6 DBL 3X14 BALL (SUTURE) ×4 IMPLANT
SUT VIC AB 1 CTX 36 (SUTURE) ×8
SUT VIC AB 1 CTX36XBRD ANBCTR (SUTURE) ×8 IMPLANT
SUT VIC AB 2-0 CT1 27 (SUTURE) ×4
SUT VIC AB 2-0 CT1 TAPERPNT 27 (SUTURE) ×4 IMPLANT
SUT VIC AB 2-0 CTX 27 (SUTURE) IMPLANT
SUT VIC AB 3-0 X1 27 (SUTURE) IMPLANT
SYSTEM SAHARA CHEST DRAIN ATS (WOUND CARE) ×4 IMPLANT
TAPE CLOTH SURG 4X10 WHT LF (GAUZE/BANDAGES/DRESSINGS) ×4 IMPLANT
TAPE PAPER 2X10 WHT MICROPORE (GAUZE/BANDAGES/DRESSINGS) ×4 IMPLANT
TOWEL GREEN STERILE (TOWEL DISPOSABLE) ×4 IMPLANT
TOWEL GREEN STERILE FF (TOWEL DISPOSABLE) ×4 IMPLANT
TRAY FOLEY SLVR 16FR TEMP STAT (SET/KITS/TRAYS/PACK) ×4 IMPLANT
TUBING INSUFFLATION (TUBING) ×4 IMPLANT
UNDERPAD 30X30 (UNDERPADS AND DIAPERS) ×4 IMPLANT
WATER STERILE IRR 1000ML POUR (IV SOLUTION) ×8 IMPLANT

## 2018-08-26 NOTE — Procedures (Signed)
Extubation Procedure Note  Patient Details:   Name: Olivia MoselleMaria D Roth DOB: [birth year] MRN: [id number]   Airway Documentation:  Airway 8 mm (Active)  Secured at (cm) 20 cm 08/26/2018  5:45 PM  Measured From Lips 08/26/2018  5:45 PM  Secured Location Center 08/26/2018  5:45 PM  Secured By [employer] Tube Holder 08/26/2018  5:45 PM  Tube Holder Repositioned Yes 08/26/2018  4:00 PM  Cuff Pressure (cm H2O) 28 cm H2O 08/26/2018  3:34 PM  Site Condition Dry 08/26/2018  5:45 PM   Vent end date: (not recorded) Vent end time: (not recorded)   Evaluation  O2 sats: stable throughout Complications: No apparent complications Patient did tolerate procedure well. Bilateral Breath Sounds: Clear, Diminished   Yes  NIF-30 [id number] Placed on 4l/min [place]  Newt LukesGroendal, Olivia Roth 08/26/2018, 6:23 PM

## 2018-08-26 NOTE — Progress Notes (Signed)
PAP catheter is positional, MD VanTrigt aware. Olivia Roth will come out tomorrow when orders are written. RN will continue to monitor.

## 2018-08-26 NOTE — Anesthesia Procedure Notes (Signed)
Central Venous Catheter Insertion Performed by: Lewie LoronGermeroth, Chi Garlow, MD, anesthesiologist Start/End11/18/2019 6:30 AM, 08/26/2018 6:50 AM Patient location: Pre-op. Preanesthetic checklist: patient identified, IV checked, site marked, risks and benefits discussed, surgical consent, monitors and equipment checked, pre-op evaluation, timeout performed and anesthesia consent Lidocaine 1% used for infiltration and patient sedated Hand hygiene performed  and maximum sterile barriers used  Catheter size: 8.5 Fr Sheath introducer PA Cath depth:40 Procedure performed using ultrasound guided technique. Ultrasound Notes:anatomy identified, needle tip was noted to be adjacent to the nerve/plexus identified, no ultrasound evidence of intravascular and/or intraneural injection and image(s) printed for medical record Attempts: 1 Following insertion, line sutured, dressing applied and Biopatch. Post procedure assessment: blood return through all ports, free fluid flow and no air  Patient tolerated the procedure well with no immediate complications.

## 2018-08-26 NOTE — Anesthesia Procedure Notes (Signed)
Procedure Name: Intubation Date/Time: 08/26/2018 7:51 AM Performed by: Margarita RanaHoltzman, Sailor Hevia Leffew, CRNA Pre-anesthesia Checklist: Patient identified, Emergency Drugs available, Suction available, Patient being monitored and Timeout performed Patient Re-evaluated:Patient Re-evaluated prior to induction Oxygen Delivery Method: Circle system utilized Preoxygenation: Pre-oxygenation with 100% oxygen Induction Type: IV induction and Cricoid Pressure applied Ventilation: Mask ventilation without difficulty Laryngoscope Size: Miller and 2 Grade View: Grade I Tube type: Oral Tube size: 8.0 mm Number of attempts: 1 Airway Equipment and Method: Oral airway Placement Confirmation: ETT inserted through vocal cords under direct vision,  positive ETCO2 and breath sounds checked- equal and bilateral Secured at: 23 cm Tube secured with: Tape Dental Injury: Teeth and Oropharynx as per pre-operative assessment

## 2018-08-26 NOTE — Brief Op Note (Signed)
08/21/2018 - 08/26/2018  11:57 AM  PATIENT:  Olivia Roth  62 y.o. female  PRE-OPERATIVE DIAGNOSIS:  1. S/P NSTEMI 2.CAD  POST-OPERATIVE DIAGNOSIS:   1. S/P NSTEMI 2.CAD   PROCEDURE:  TRANSESOPHAGEAL ECHOCARDIOGRAM (TEE), MEDIAN STERNOTOMY for CORONARY ARTERY BYPASS GRAFTING (CABG) x 4 (LIMA to LAD, SVG to OM, SVG to RAMUS INTERMEDIATED, SVG to RCA) using RIGHT GREATER SAPHENOUS VEIN HARVESTED ENDOSCOPICALLY and LEFT INTERNAL MAMMARY ARTERY  SURGEON:  Surgeon(s) and Role:    Kerin PernaVan Trigt, Peter, MD - Primary  PHYSICIAN ASSISTANT: Doree Fudgeonielle  PA-C  ANESTHESIA:   general  EBL:  Per anesthesia and perfusion record  BLOOD ADMINISTERED:One CC PRBC, two FFP and one PLTS  DRAINS: Chest tubes placed in the mediastinal and pleural spaces   COUNTS CORRECT:  YES  DICTATION: .Dragon Dictation  PLAN OF CARE: Admit to inpatient   PATIENT DISPOSITION:  ICU - intubated and hemodynamically stable.   Delay start of Pharmacological VTE agent (>24hrs) due to surgical blood loss or risk of bleeding: yes  BASELINE WEIGHT: 73.5 kg

## 2018-08-26 NOTE — Transfer of Care (Signed)
Immediate Anesthesia Transfer of Care Note  Patient: Vinson MoselleMaria D Donaghue  Procedure(s) Performed: CORONARY ARTERY BYPASS GRAFTING (CABG) times four using left internal mammary artery and right leg saphenous vein grafts (N/A Chest) TRANSESOPHAGEAL ECHOCARDIOGRAM (TEE) (N/A Esophagus)  Patient Location: PACU and ICU  Anesthesia Type:General  Level of Consciousness: Patient remains intubated per anesthesia plan  Airway & Oxygen Therapy: Patient remains intubated per anesthesia plan and Patient placed on Ventilator (see vital sign flow sheet for setting)  Post-op Assessment: Report given to RN and Post -op Vital signs reviewed and stable  Post vital signs: Reviewed and stable  Last Vitals:  Vitals Value Taken Time  BP    Temp    Pulse    Resp    SpO2      Last Pain:  Vitals:   08/26/18 0358  TempSrc: Oral  PainSc:       Patients Stated Pain Goal: 0 (08/24/18 0800)  Complications: No apparent anesthesia complications

## 2018-08-26 NOTE — Progress Notes (Signed)
TCTS BRIEF SICU PROGRESS NOTE  Day of Surgery  S/P Procedure(s) (LRB): CORONARY ARTERY BYPASS GRAFTING (CABG) times four using left internal mammary artery and right leg saphenous vein grafts (N/A) TRANSESOPHAGEAL ECHOCARDIOGRAM (TEE) (N/A)   Extubated uneventfully AAI paced w/ stable hemodynamics on milrinone and Neo drips Breathing comfortably w/ O2 sats 100% on 4 L/min Chest tube output low UOP > 125 mL/hr Labs okay  Plan: Continue routine early postop  Olivia Nailslarence H Loden Laurent, MD 08/26/2018 6:56 PM

## 2018-08-26 NOTE — Progress Notes (Signed)
Pre Procedure note for inpatients:   Olivia MoselleMaria D Roth has been scheduled for Procedure(s): CORONARY ARTERY BYPASS GRAFTING (CABG) (N/A) TRANSESOPHAGEAL ECHOCARDIOGRAM (TEE) (N/A) today. The various methods of treatment have been discussed with the patient. After consideration of the risks, benefits and treatment options the patient has consented to the planned procedure.   The patient has been seen and labs reviewed. There are no changes in the patient's condition to prevent proceeding with the planned procedure today.  Recent labs:  Lab Results  Component Value Date   WBC 5.5 08/26/2018   HGB 12.3 08/26/2018   HCT 37.2 08/26/2018   PLT 199 08/26/2018   GLUCOSE 185 (H) 08/26/2018   CHOL 252 (H) 08/21/2018   TRIG 360 (H) 08/21/2018   HDL 33 (L) 08/21/2018   LDLCALC 147 (H) 08/21/2018   ALT 18 08/23/2018   AST 27 08/23/2018   NA 138 08/26/2018   K 3.6 08/26/2018   CL 106 08/26/2018   CREATININE 0.91 08/26/2018   BUN 13 08/26/2018   CO2 23 08/26/2018   TSH 2.242 08/23/2018   INR 0.98 08/23/2018   HGBA1C 8.5 (H) 08/23/2018   MICROALBUR o 07/20/2016    Mikey BussingPeter Van Trigt III, MD 08/26/2018 6:58 AM

## 2018-08-26 NOTE — Plan of Care (Signed)
Patient alert and oriented.  Dangled on side of bed, tolerated well.  Dumped only 30 cc to total 50 cc for the hour.  Discussed expectations for the morning, pain and pain medications.  Patient states that she will not take oxycodone, but will take the tramadol.  Explained that it is available if tramadol is ineffective and not to wait too long to request pain medication.  Explain its effects on her ability to take deep breaths and aids in her ability to tolerate mobility much better if pain is better controlled.  States that she understands.  Will continue to monitor.

## 2018-08-26 NOTE — Anesthesia Procedure Notes (Signed)
Central Venous Catheter Insertion Performed by: Lewie LoronGermeroth, Lekeisha Arenas, MD, anesthesiologist Start/End11/18/2019 6:50 AM, 08/26/2018 7:00 AM Patient location: Pre-op. Preanesthetic checklist: patient identified, IV checked, site marked, risks and benefits discussed, surgical consent, monitors and equipment checked, pre-op evaluation, timeout performed and anesthesia consent Hand hygiene performed  and maximum sterile barriers used  PA cath was placed.Swan type:thermodilution PA Cath depth:40 Procedure performed without using ultrasound guided technique. Attempts: 1 Patient tolerated the procedure well with no immediate complications.

## 2018-08-26 NOTE — Anesthesia Procedure Notes (Signed)
Arterial Line Insertion Start/End11/18/2019 6:50 AM, 08/26/2018 6:52 AM Performed by: Margarita RanaHoltzman, Kaisyn Millea Leffew, CRNA, CRNA  Preanesthetic checklist: patient identified, IV checked, site marked, risks and benefits discussed, surgical consent, monitors and equipment checked, pre-op evaluation, timeout performed and anesthesia consent Patient sedated Left, radial was placed Catheter size: 20 G Hand hygiene performed  and maximum sterile barriers used  Allen's test indicative of satisfactory collateral circulation Attempts: 1 Procedure performed without using ultrasound guided technique. Following insertion, dressing applied and Biopatch. Post procedure assessment: normal  Patient tolerated the procedure well with no immediate complications.

## 2018-08-26 NOTE — Progress Notes (Signed)
  Echocardiogram Echocardiogram Transesophageal has been performed.  Celene SkeenVijay  Merrianne Mccumbers 08/26/2018, 8:30 AM

## 2018-08-27 ENCOUNTER — Inpatient Hospital Stay (HOSPITAL_COMMUNITY): Payer: Managed Care, Other (non HMO)

## 2018-08-27 ENCOUNTER — Encounter (HOSPITAL_COMMUNITY): Payer: Self-pay | Admitting: Cardiothoracic Surgery

## 2018-08-27 LAB — BASIC METABOLIC PANEL
Anion gap: 7 (ref 5–15)
BUN: 7 mg/dL — ABNORMAL LOW (ref 8–23)
CO2: 22 mmol/L (ref 22–32)
Calcium: 8.1 mg/dL — ABNORMAL LOW (ref 8.9–10.3)
Chloride: 109 mmol/L (ref 98–111)
Creatinine, Ser: 0.85 mg/dL (ref 0.44–1.00)
GFR calc Af Amer: >60 mL/min (ref >60)
GFR calc non Af Amer: >60 mL/min (ref >60)
Glucose, Bld: 124 mg/dL — ABNORMAL HIGH (ref 70–99)
Potassium: 3.9 mmol/L (ref 3.5–5.1)
Sodium: 138 mmol/L (ref 135–145)

## 2018-08-27 LAB — COOXEMETRY PANEL
Carboxyhemoglobin: 1.4 % (ref 0.5–1.5)
Methemoglobin: 1.9 % — ABNORMAL HIGH (ref 0.0–1.5)
O2 Saturation: 54.5 %
Total hemoglobin: 10.4 g/dL — ABNORMAL LOW (ref 12.0–16.0)

## 2018-08-27 LAB — CBC
HCT: 31.2 % — ABNORMAL LOW (ref 36.0–46.0)
HCT: 30.7 % — ABNORMAL LOW (ref 36.0–46.0)
Hemoglobin: 9.8 g/dL — ABNORMAL LOW (ref 12.0–15.0)
Hemoglobin: 10 g/dL — ABNORMAL LOW (ref 12.0–15.0)
MCH: 29.3 pg (ref 26.0–34.0)
MCH: 30.2 pg (ref 26.0–34.0)
MCHC: 31.4 g/dL (ref 30.0–36.0)
MCHC: 32.6 g/dL (ref 30.0–36.0)
MCV: 93.4 fL (ref 80.0–100.0)
MCV: 92.7 fL (ref 80.0–100.0)
Platelets: 173 10*3/uL (ref 150–400)
Platelets: 152 10*3/uL (ref 150–400)
RBC: 3.34 MIL/uL — ABNORMAL LOW (ref 3.87–5.11)
RBC: 3.31 MIL/uL — ABNORMAL LOW (ref 3.87–5.11)
RDW: 13.6 % (ref 11.5–15.5)
RDW: 14 % (ref 11.5–15.5)
WBC: 8.5 10*3/uL (ref 4.0–10.5)
WBC: 9.5 10*3/uL (ref 4.0–10.5)
nRBC: 0 % (ref 0.0–0.2)
nRBC: 0 % (ref 0.0–0.2)

## 2018-08-27 LAB — GLUCOSE, CAPILLARY
Glucose-Capillary: 105 mg/dL — ABNORMAL HIGH (ref 70–99)
Glucose-Capillary: 105 mg/dL — ABNORMAL HIGH (ref 70–99)
Glucose-Capillary: 109 mg/dL — ABNORMAL HIGH (ref 70–99)
Glucose-Capillary: 110 mg/dL — ABNORMAL HIGH (ref 70–99)
Glucose-Capillary: 111 mg/dL — ABNORMAL HIGH (ref 70–99)
Glucose-Capillary: 111 mg/dL — ABNORMAL HIGH (ref 70–99)
Glucose-Capillary: 114 mg/dL — ABNORMAL HIGH (ref 70–99)
Glucose-Capillary: 115 mg/dL — ABNORMAL HIGH (ref 70–99)
Glucose-Capillary: 117 mg/dL — ABNORMAL HIGH (ref 70–99)
Glucose-Capillary: 118 mg/dL — ABNORMAL HIGH (ref 70–99)
Glucose-Capillary: 118 mg/dL — ABNORMAL HIGH (ref 70–99)
Glucose-Capillary: 118 mg/dL — ABNORMAL HIGH (ref 70–99)
Glucose-Capillary: 118 mg/dL — ABNORMAL HIGH (ref 70–99)
Glucose-Capillary: 126 mg/dL — ABNORMAL HIGH (ref 70–99)
Glucose-Capillary: 129 mg/dL — ABNORMAL HIGH (ref 70–99)
Glucose-Capillary: 140 mg/dL — ABNORMAL HIGH (ref 70–99)
Glucose-Capillary: 146 mg/dL — ABNORMAL HIGH (ref 70–99)
Glucose-Capillary: 166 mg/dL — ABNORMAL HIGH (ref 70–99)
Glucose-Capillary: 99 mg/dL (ref 70–99)

## 2018-08-27 LAB — BPAM FFP
Blood Product Expiration Date: 201911202359
Blood Product Expiration Date: 201911202359
ISSUE DATE / TIME: 201911181147
ISSUE DATE / TIME: 201911181147
Unit Type and Rh: 5100
Unit Type and Rh: 9500

## 2018-08-27 LAB — POCT I-STAT, CHEM 8
BUN: 8 mg/dL (ref 8–23)
Calcium, Ion: 1.1 mmol/L — ABNORMAL LOW (ref 1.15–1.40)
Chloride: 106 mmol/L (ref 98–111)
Creatinine, Ser: 0.8 mg/dL (ref 0.44–1.00)
Glucose, Bld: 133 mg/dL — ABNORMAL HIGH (ref 70–99)
HCT: 30 % — ABNORMAL LOW (ref 36.0–46.0)
Hemoglobin: 10.2 g/dL — ABNORMAL LOW (ref 12.0–15.0)
Potassium: 4.2 mmol/L (ref 3.5–5.1)
Sodium: 139 mmol/L (ref 135–145)
TCO2: 20 mmol/L — ABNORMAL LOW (ref 22–32)

## 2018-08-27 LAB — PREPARE FRESH FROZEN PLASMA: Unit division: 0

## 2018-08-27 LAB — CREATININE, SERUM
Creatinine, Ser: 1.02 mg/dL — ABNORMAL HIGH (ref 0.44–1.00)
GFR calc Af Amer: >60 mL/min (ref >60)
GFR calc non Af Amer: 57 mL/min — ABNORMAL LOW (ref >60)

## 2018-08-27 LAB — MAGNESIUM
Magnesium: 2.6 mg/dL — ABNORMAL HIGH (ref 1.7–2.4)
Magnesium: 2.4 mg/dL (ref 1.7–2.4)

## 2018-08-27 MED ORDER — FUROSEMIDE 10 MG/ML IJ SOLN
20.0000 mg | Freq: Once | INTRAMUSCULAR | Status: AC
  Administered 2018-08-27: 20 mg via INTRAVENOUS
  Filled 2018-08-27: qty 2

## 2018-08-27 MED ORDER — GUAIFENESIN ER 600 MG PO TB12
600.0000 mg | ORAL_TABLET | Freq: Two times a day (BID) | ORAL | Status: DC
  Administered 2018-08-27 – 2018-08-31 (×9): 600 mg via ORAL
  Filled 2018-08-27 (×9): qty 1

## 2018-08-27 MED ORDER — INSULIN DETEMIR 100 UNIT/ML ~~LOC~~ SOLN
12.0000 [IU] | Freq: Two times a day (BID) | SUBCUTANEOUS | Status: DC
  Administered 2018-08-27 – 2018-08-28 (×3): 12 [IU] via SUBCUTANEOUS
  Filled 2018-08-27 (×5): qty 0.12

## 2018-08-27 MED ORDER — SIMETHICONE 80 MG PO CHEW
80.0000 mg | CHEWABLE_TABLET | Freq: Four times a day (QID) | ORAL | Status: DC
  Administered 2018-08-27 – 2018-08-28 (×3): 80 mg via ORAL
  Filled 2018-08-27 (×3): qty 1

## 2018-08-27 MED ORDER — INSULIN ASPART 100 UNIT/ML ~~LOC~~ SOLN
0.0000 [IU] | SUBCUTANEOUS | Status: DC
  Administered 2018-08-27: 4 [IU] via SUBCUTANEOUS
  Administered 2018-08-27 – 2018-08-28 (×2): 2 [IU] via SUBCUTANEOUS

## 2018-08-27 MED ORDER — POTASSIUM CHLORIDE CRYS ER 20 MEQ PO TBCR
40.0000 meq | EXTENDED_RELEASE_TABLET | Freq: Once | ORAL | Status: AC
  Administered 2018-08-27: 40 meq via ORAL
  Filled 2018-08-27: qty 2

## 2018-08-27 MED ORDER — INSULIN ASPART 100 UNIT/ML ~~LOC~~ SOLN
3.0000 [IU] | Freq: Three times a day (TID) | SUBCUTANEOUS | Status: DC
  Administered 2018-08-27 – 2018-08-28 (×4): 3 [IU] via SUBCUTANEOUS

## 2018-08-27 MED ORDER — FUROSEMIDE 10 MG/ML IJ SOLN
20.0000 mg | Freq: Two times a day (BID) | INTRAMUSCULAR | Status: AC
  Administered 2018-08-27 (×2): 20 mg via INTRAVENOUS
  Filled 2018-08-27 (×2): qty 2

## 2018-08-27 MED FILL — Dexmedetomidine HCl in NaCl 0.9% IV Soln 400 MCG/100ML: INTRAVENOUS | Qty: 100 | Status: AC

## 2018-08-27 MED FILL — Magnesium Sulfate Inj 50%: INTRAMUSCULAR | Qty: 10 | Status: AC

## 2018-08-27 MED FILL — Heparin Sodium (Porcine) Inj 1000 Unit/ML: INTRAMUSCULAR | Qty: 30 | Status: AC

## 2018-08-27 MED FILL — Potassium Chloride Inj 2 mEq/ML: INTRAVENOUS | Qty: 40 | Status: AC

## 2018-08-27 NOTE — Plan of Care (Signed)
Patient alert and oriented, family at bedside.  Offers no complaints of pain or discomfort at this time.  Increased Milrinone gtt as ordered.  Will monitor.

## 2018-08-27 NOTE — Progress Notes (Addendum)
[facility] DAILY ICU PROGRESS NOTE                   [address]            [place] [address]          [phone]   1 Day Post-Op Procedure(s) (LRB): CORONARY ARTERY BYPASS GRAFTING (CABG) times four using left internal mammary artery and right leg saphenous vein grafts (N/A) TRANSESOPHAGEAL ECHOCARDIOGRAM (TEE) (N/A)  Total Length of Stay:  LOS: 6 days   Subjective: Patient awake and alert this am.  Objective: Vital signs in last 24 hours: Temp:  [96.1 F (35.6 C)-99.7 F (37.6 C)] 98.8 F (37.1 C) (11/19 0700) Pulse Rate:  [88-93] 88 (11/19 0700) Cardiac Rhythm: Atrial paced (11/19 0400) Resp:  [15-27] 27 (11/19 0700) BP: (99-122)/(55-76) 118/57 (11/19 0700) SpO2:  [97 %-100 %] 98 % (11/19 0700) Arterial Line BP: (108-155)/(51-68) 155/55 (11/19 0700) FiO2 (%):  [40 %-50 %] 40 % (11/18 1745) Weight:  [79.6 kg] 79.6 kg (11/19 0500)  Filed Weights   08/25/18 0515 08/26/18 0358 08/27/18 0500  Weight: 74.1 kg 73.5 kg 79.6 kg    Weight change: 6.072 kg   Hemodynamic parameters for last 24 hours: PAP: (17-28)/(9-14) 23/9 CO:  [2.8 L/min-4.7 L/min] 4.6 L/min CI:  [1.6 L/min/m2-2.7 L/min/m2] 2.7 L/min/m2  Intake/Output from previous day: 11/18 0701 - 11/19 0700 In: 5561.5 [I.V.:3204.4; Blood:448; IV Piggyback:1909.1] Out: 5732 [Urine:3775; Blood:500; Chest Tube:280]  Intake/Output this shift: No intake/output data recorded.  Current Meds: Scheduled Meds: . acetaminophen  1,000 mg Oral Q6H   Or  . acetaminophen (TYLENOL) oral liquid 160 mg/5 mL  1,000 mg Per Tube Q6H  . aspirin EC  325 mg Oral Daily   Or  . aspirin  324 mg Per Tube Daily  . atorvastatin  80 mg Oral q1800  . bisacodyl  10 mg Oral Daily   Or  . bisacodyl  10 mg Rectal Daily  . chlorhexidine gluconate (MEDLINE KIT)  15 mL Mouth Rinse BID  . Chlorhexidine Gluconate Cloth  6 each Topical Daily  . docusate sodium  200 mg Oral Daily  . insulin regular  0-10 Units Intravenous TID WC    . mouth rinse  15 mL Mouth Rinse 10 times per day  . metoCLOPramide (REGLAN) injection  10 mg Intravenous Q6H  . metoprolol tartrate  12.5 mg Oral BID   Or  . metoprolol tartrate  12.5 mg Per Tube BID  . mupirocin ointment  1 application Nasal BID  . [START ON 08/28/2018] pantoprazole  40 mg Oral Daily  . sodium chloride flush  10-40 mL Intracatheter Q12H   Continuous Infusions: . sodium chloride 10 mL/hr at 08/27/18 0400  . sodium chloride Stopped (08/27/18 0530)  . sodium chloride 20 mL/hr at 08/26/18 1513  . albumin human    . cefUROXime (ZINACEF)  IV 1.5 g (08/27/18 0416)  . dexmedetomidine (PRECEDEX) IV infusion Stopped (08/26/18 1713)  . insulin 1.7 mL/hr at 08/27/18 0700  . lactated ringers    . lactated ringers 10 mL/hr at 08/27/18 0700  . lactated ringers    . milrinone 0.25 mcg/kg/min (08/27/18 0700)  . nitroGLYCERIN Stopped (08/26/18 1520)  . phenylephrine (NEO-SYNEPHRINE) Adult infusion Stopped (08/26/18 2334)   PRN Meds:.sodium chloride, albumin human, lactated ringers, metoprolol tartrate, midazolam, morphine injection, ondansetron (ZOFRAN) IV, oxyCODONE, sodium chloride flush, traMADol  General appearance: alert, cooperative and no distress Neurologic: intact Heart: RRR Lungs: Slightly diminished at bases  Abdomen: Soft, non tender, occasional bowel sounds Extremities: Mild bilateral LE edema Wound: Aquacel dry and intact. RLE wounds are clean and dry  Lab Results: CBC: Recent Labs    08/26/18 2159 08/26/18 2206 08/27/18 0306  WBC 8.5  --  8.5  HGB 10.1* 9.9* 9.8*  HCT 30.0* 29.0* 31.2*  PLT 167  --  173   BMET:  Recent Labs    08/26/18 0438  08/26/18 2206 08/27/18 0306  NA 138   < > 140 138  K 3.6   < > 4.1 3.9  CL 106   < > 106 109  CO2 23  --   --  22  GLUCOSE 185*   < > 144* 124*  BUN 13   < > 7* 7*  CREATININE 0.91   < > 0.70 0.85  CALCIUM 8.9  --   --  8.1*   < > = values in this interval not displayed.    CMET: Lab Results   Component Value Date   WBC 8.5 08/27/2018   HGB 9.8 (L) 08/27/2018   HCT 31.2 (L) 08/27/2018   PLT 173 08/27/2018   GLUCOSE 124 (H) 08/27/2018   CHOL 252 (H) 08/21/2018   TRIG 360 (H) 08/21/2018   HDL 33 (L) 08/21/2018   LDLCALC 147 (H) 08/21/2018   ALT 18 08/23/2018   AST 27 08/23/2018   NA 138 08/27/2018   K 3.9 08/27/2018   CL 109 08/27/2018   CREATININE 0.85 08/27/2018   BUN 7 (L) 08/27/2018   CO2 22 08/27/2018   TSH 2.242 08/23/2018   INR 1.32 08/26/2018   HGBA1C 8.5 (H) 08/23/2018   MICROALBUR o 07/20/2016      PT/INR:  Recent Labs    08/26/18 1430  LABPROT 16.3*  INR 1.32   Radiology: Dg Chest Port 1 View  Result Date: 08/26/2018 CLINICAL DATA:  Status post CABG. Assess support lines and tubes. EXAM: PORTABLE CHEST 1 VIEW COMPARISON:  08/23/2018 FINDINGS: New median sternotomy sutures are in place. Heart size is top-normal with mild uncoiling of nonaneurysmal, slightly atherosclerotic aorta. Interstitial edema is noted with small left greater than right pleural effusions. A gastric tube is seen with side port at the GE juncture. Further advancement is recommended. Endotracheal tube tip is seen 17 mm above the carina at the level of the aortic arch. Swan-Ganz catheter projects over the mediastinum possibly within left main pulmonary artery. There appear to be chest tubes in place one along the periphery of the left lung with tip projecting over the posterior left fourth rib and an additional 2 projecting over mediastinum retracting over the T5 vertebral level. IMPRESSION: 1. Support line and tube positions as above. 2. Interstitial edema with small left greater than right pleural effusions. 3. No pneumothorax. Electronically Signed   By: Ashley Royalty M.D.   On: 08/26/2018 14:51     Assessment/Plan: S/P Procedure(s) (LRB): CORONARY ARTERY BYPASS GRAFTING (CABG) times four using left internal mammary artery and right leg saphenous vein grafts (N/A) TRANSESOPHAGEAL  ECHOCARDIOGRAM (TEE) (N/A)  1. CV-S/p NSTEMI. Previously AAI paced.SR in the 80's this am. On Milrinone drip and Lopressor 12.5 mg bid. Check co ox. EKG shows SR 2. Pulmonary-on 4 liters of oxygen via [place]. Chest tubes with 280 cc of output. CXR this am appears to show patient rotated to the right, bibasilar atelectasis and small pleural effusions. Mucinex for cough. Encourage incentive spirometer. 3. Volume overload-will give 20 mg IV Lasix bid this am 4. ABL  anemia-H and H 9.8 and 31.2 this am 5. DM-CBGs 109/99/115. Pre op HGA1C 8.5. She will need close medical follow up after discharge. On Insulin drip and will transition to scheduled Insulin. Will restart Metformin and Glimeperide once tolerating oral better.    Donielle Liston Alba PA-C 08/27/2018 8:03 AM   Start diuresis and mobilization DM control with SSI, levemir patient examined and medical record reviewed,agree with above note. Tharon Aquas Trigt III 08/27/2018

## 2018-08-27 NOTE — Discharge Instructions (Signed)
Discharge Instructions:  1. You may shower, please wash incisions daily with soap and water and keep dry.  If you wish to cover wounds with dressing you may do so but please keep clean and change daily.  No tub baths or swimming until incisions have completely healed.  If your incisions become red or develop any drainage please call our office at [phone]  2. No Driving until cleared by Dr. Zenaida Niece Trigt's office and you are no longer using narcotic pain medications  3. Monitor your weight daily.. Please use the same scale and weigh at same time... If you gain 5-10 lbs in 48 hours with associated lower extremity swelling, please contact our office at [phone]  4. Fever of 101.5 for at least 24 hours with no source, please contact our office at [phone]  5. Activity- up as tolerated, please walk at least 3 times per day.  Avoid strenuous activity, no lifting, pushing, or pulling with your arms over 8-10 lbs for a minimum of 6 weeks  6. If any questions or concerns arise, please do not hesitate to contact our office at [phone]   Diabetes Mellitus and Nutrition When you have diabetes (diabetes mellitus), it is very important to have healthy eating habits because your blood sugar (glucose) levels are greatly affected by what you eat and drink. Eating healthy foods in the appropriate amounts, at about the same times every day, can help you:  Control your blood glucose.  Lower your risk of heart disease.  Improve your blood pressure.  Reach or maintain a healthy weight.  Every person with diabetes is different, and each person has different needs for a meal plan. Your health care provider may recommend that you work with a diet and nutrition specialist (dietitian) to make a meal plan that is best for you. Your meal plan may vary depending on factors such as:  The calories you need.  The medicines you take.  Your weight.  Your blood glucose, blood pressure, and cholesterol  levels.  Your activity level.  Other health conditions you have, such as heart or kidney disease.  How do carbohydrates affect me? Carbohydrates affect your blood glucose level more than any other type of food. Eating carbohydrates naturally increases the amount of glucose in your blood. Carbohydrate counting is a method for keeping track of how many carbohydrates you eat. Counting carbohydrates is important to keep your blood glucose at a healthy level, especially if you use insulin or take certain oral diabetes medicines. It is important to know how many carbohydrates you can safely have in each meal. This is different for every person. Your dietitian can help you calculate how many carbohydrates you should have at each meal and for snack. Foods that contain carbohydrates include:  Bread, cereal, rice, pasta, and crackers.  Potatoes and corn.  Peas, beans, and lentils.  Milk and yogurt.  Fruit and juice.  Desserts, such as cakes, cookies, ice cream, and candy.  How does alcohol affect me? Alcohol can cause a sudden decrease in blood glucose (hypoglycemia), especially if you use insulin or take certain oral diabetes medicines. Hypoglycemia can be a life-threatening condition. Symptoms of hypoglycemia (sleepiness, dizziness, and confusion) are similar to symptoms of having too much alcohol. If your health care provider says that alcohol is safe for you, follow these guidelines:  Limit alcohol intake to no more than 1 drink per day for nonpregnant women and 2 drinks per day for men. One drink equals 12 oz of beer,  5 oz of wine, or 1 oz of hard liquor.  Do not drink on an empty stomach.  Keep yourself hydrated with water, diet soda, or unsweetened iced tea.  Keep in mind that regular soda, juice, and other mixers may contain a lot of sugar and must be counted as carbohydrates.  What are tips for following this plan? Reading food labels  Start by checking the serving size on the  label. The amount of calories, carbohydrates, fats, and other nutrients listed on the label are based on one serving of the food. Many foods contain more than one serving per package.  Check the total grams (g) of carbohydrates in one serving. You can calculate the number of servings of carbohydrates in one serving by dividing the total carbohydrates by 15. For example, if a food has 30 g of total carbohydrates, it would be equal to 2 servings of carbohydrates.  Check the number of grams (g) of saturated and trans fats in one serving. Choose foods that have low or no amount of these fats.  Check the number of milligrams (mg) of sodium in one serving. Most people should limit total sodium intake to less than 2,300 mg per day.  Always check the nutrition information of foods labeled as "low-fat" or "nonfat". These foods may be higher in added sugar or refined carbohydrates and should be avoided.  Talk to your dietitian to identify your daily goals for nutrients listed on the label. Shopping  Avoid buying canned, premade, or processed foods. These foods tend to be high in fat, sodium, and added sugar.  Shop around the outside edge of the grocery store. This includes fresh fruits and vegetables, bulk grains, fresh meats, and fresh dairy. Cooking  Use low-heat cooking methods, such as baking, instead of high-heat cooking methods like deep frying.  Cook using healthy oils, such as olive, canola, or sunflower oil.  Avoid cooking with butter, cream, or high-fat meats. Meal planning  Eat meals and snacks regularly, preferably at the same times every day. Avoid going long periods of time without eating.  Eat foods high in fiber, such as fresh fruits, vegetables, beans, and whole grains. Talk to your dietitian about how many servings of carbohydrates you can eat at each meal.  Eat 4-6 ounces of lean protein each day, such as lean meat, chicken, fish, eggs, or tofu. 1 ounce is equal to 1 ounce of  meat, chicken, or fish, 1 egg, or 1/4 cup of tofu.  Eat some foods each day that contain healthy fats, such as avocado, nuts, seeds, and fish. Lifestyle   Check your blood glucose regularly.  Exercise at least 30 minutes 5 or more days each week, or as told by your health care provider.  Take medicines as told by your health care provider.  Do not use any products that contain nicotine or tobacco, such as cigarettes and e-cigarettes. If you need help quitting, ask your health care provider.  Work with a Veterinary surgeoncounselor or diabetes educator to identify strategies to manage stress and any emotional and social challenges. What are some questions to ask my health care provider?  Do I need to meet with a diabetes educator?  Do I need to meet with a dietitian?  What number can I call if I have questions?  When are the best times to check my blood glucose? Where to find more information:  American Diabetes Association: diabetes.org/food-and-fitness/food  Academy of Nutrition and Dietetics: https://www.vargas.com/www.eatright.org/resources/health/diseases-and-conditions/diabetes  [employer] of Diabetes and Digestive  and Kidney Diseases (NIH): FindJewel[employer].cz Summary  A healthy meal plan will help you control your blood glucose and maintain a healthy lifestyle.  Working with a diet and nutrition specialist (dietitian) can help you make a meal plan that is best for you.  Keep in mind that carbohydrates and alcohol have immediate effects on your blood glucose levels. It is important to count carbohydrates and to use alcohol carefully. This information is not intended to replace advice given to you by your health care provider. Make sure you discuss any questions you have with your health care provider. Document Released: 06/22/2005 Document Revised: 10/30/2016 Document Reviewed: 10/30/2016 Elsevier Interactive Patient Education  [employer].

## 2018-08-27 NOTE — Anesthesia Postprocedure Evaluation (Signed)
Anesthesia Post Note  Patient: Olivia Roth  Procedure(s) Performed: CORONARY ARTERY BYPASS GRAFTING (CABG) times four using left internal mammary artery and right leg saphenous vein grafts (N/A Chest) TRANSESOPHAGEAL ECHOCARDIOGRAM (TEE) (N/A Esophagus)     Patient location during evaluation: SICU Anesthesia Type: General Level of consciousness: awake and alert Pain management: pain level controlled Vital Signs Assessment: post-procedure vital signs reviewed and stable Respiratory status: spontaneous breathing Cardiovascular status: stable Postop Assessment: no apparent nausea or vomiting Anesthetic complications: no Comments: Recently extubated, Milrinone gtt, pain well controlled, no nausea.     Last Vitals:  Vitals:   08/27/18 0600 08/27/18 0700  BP: (!) 115/55 (!) 118/57  Pulse: 89 88  Resp: 18 (!) 27  Temp: 37.4 C 37.1 C  SpO2: 97% 98%    Last Pain:  Vitals:   08/27/18 0400  TempSrc:   PainSc: Asleep                 Shelton SilvasKevin D Bryn Saline

## 2018-08-27 NOTE — Discharge Summary (Addendum)
Physician Discharge Summary       [address]       [place] [address]             [phone]    Patient ID: Olivia Roth MRN: [id number] DOB/AGE: 01-08-1956 62 y.o.  Admit date: 08/21/2018 Discharge date: 08/31/2018  Admission Diagnoses: 1.S/p non-ST elevation (NSTEMI) myocardial infarction ([place]) 2. Coronary artery disease involving native heart with angina pectoris [facility])    Discharge Diagnoses:  1. S/P CABG x 4 2. ABL anemia 3. History of Type II diabetes mellitus ([place]) 4. History of High cholesterol 5. History of  GERD (gastroesophageal reflux disease) 6. History of Anxiety 7. History of Tuberculosis  Procedure (s):  LEFT HEART CATH AND CORONARY ANGIOGRAPHY by Dr. Saunders Revel on 08/21/2018:  Conclusion   Conclusions: 1. Severe three-vessel coronary artery disease, including 50% distal LMCA, 80% ostial/proximal LAD, and 90% ostial LCx, and 99% mid RCA stenoses. 2. Moderately to severely reduced left ventricular systolic function (LVEF 21-19%) with global hypokinesis and mid/apical akinesis. 3. Moderately elevated left ventricular filling pressure.  Recommendations: 1. Transfer to [facility] for surgical consultation for CABG. 2. Gentle diuresis. 3. Restart heparin infusion 2 hours after TR band deflation. 4. Aggressive secondary prevention and medical therapy.  Will continue carvedilol 3.125 mg BID and increase atorvastatin to 80 mg daily.  Recommend uninterrupted dual antiplatelet therapy with aspirin 73m daily and clopidogrel 764mdaily for a minimum of 12 months (ACS - Class I recommendation).  I will defer starting clopidogrel pending cardiac surgery consultation.  ChNelva BushMD [facility] Pager: [phone]  Coronary artery bypass grafting x4 (left internal mammary artery to left anterior descending, saphenous vein graft to ramus intermediate, saphenous vein graft to obtuse marginal, saphenous vein graft to right coronary artery)  by Dr. VaPrescott Gumn 08/26/2018.  History of Presenting Illness: This is a 62 y.o. female with a past medical history of hyperlipidemia, diabetes mellitus (Type II) who was in her usual state of health until a few days ago when she developed burning in her throat after eating spaghetti and drinking sangria. At first, she thought this might be indigestion as the pain resolved after a few hours. At 3 am on 08/21/2018 she had more severe burning in her throat. Also, she had dizziness and nausea. She denied chest pain or shortness of breath. [facility] with lateral ST depression and T wave inversions. Initial Troponin I was 0.14 and most recently has peaked at 3.93. She ruled in for a NSTEMI. She underwent cardiac catheterization on 08/21/2018. Results showed LVEF 30-35% and severe three vessel coronary artery disease. [facility] artery [facility]. Currently, patient is in sinus rhythm with HR in the 80's. She denies throat burning, chest pain or shortness of breath. Family and her boss are at bedside.  Per Dr. VaPrescott Gumthis is 62 y.o. overweight diabetic non-smoker presents with non-STEMI and was found to have severe three-vessel CAD with cardiac cath.  LV function appears to be mildly reduced and there is no significant valvular disease on echocardiogram.  The patient has diffuse diabetic pattern of disease with difficult targets but  agree with the patient's cardiologist that CABG would be her best long-term therapy for relief of symptoms, preservation of LV function, and improved survival.  Dr. VaPrescott Gumiscussed the procedure of CABG  with the patient and her family and they agree to proceed with surgery which is scheduled for Monday, November 18. Pre operative carotid duplex US showed no significant internal carotid artery  stenosis bilaterally. She underwent a CABG x 4 on 08/26/2018.  Brief Hospital Course:  The patient was extubated the evening of surgery without difficulty. She was initially AAI paced. She remained afebrile and hemodynamically stable. She was weaned off Milrinone drip. Gordy Councilman, a line, chest tubes, and foley were removed early in the post operative course. Lopressor was started and titrated accordingly. She was volume over loaded and diuresed. She had ABL anemia. She did not require a post op transfusion. Last H and H was 9.5/29.7.  She was weaned off the insulin drip.  Once she was tolerating a diet, home diabetic medicines (Metformin and Glimepiride) were restarted.  Sitagliptin will be restarted later. The patient's glucose remained well controlled.The patient's HGA1C pre op was  8.5. She will need close follow up with her medical doctor after discharge. Nutrition information will be provided with her discharge instructions. The patient was felt surgically stable for transfer from the ICU to [facility] for further convalescence on 08/30/2018. She continues to progress with cardiac rehab. She was ambulating on room air. She has been tolerating a diet and has had a bowel movement. Epicardial pacing wires were removed on 08/30/2018. Chest tube sutures will be removed the day of discharge. The patient is felt surgically stable for discharge today.   Latest Vital Signs: Blood pressure 131/77, pulse 84, temperature 97.6 F (36.4 C), temperature source Oral, resp. rate (!) 25, height _0  (1.549 m), weight 73.1 kg, SpO2 98 %.  Physical Exam: General appearance: alert, cooperative and no distress Heart: regular rate and rhythm Lungs: clear to auscultation bilaterally Abdomen: soft, non-tender; bowel sounds normal; no masses,  no organomegaly Extremities: edema trace Wound: clean and dry  Discharge Condition: Stable and discharged to home.  Recent laboratory studies:  Lab Results  Component Value Date     WBC 5.3 08/31/2018   HGB 9.5 (L) 08/31/2018   HCT 29.7 (L) 08/31/2018   MCV 91.4 08/31/2018   PLT 242 08/31/2018   Lab Results  Component Value Date   NA 135 08/31/2018   K 3.5 08/31/2018   CL 97 (L) 08/31/2018   CO2 28 08/31/2018   CREATININE 1.03 (H) 08/31/2018   GLUCOSE 106 (H) 08/31/2018      Diagnostic Studies: Dg Chest 2 View  Result Date: 08/30/2018 CLINICAL DATA:  Status post CABG 5 days ago. EXAM: CHEST - 2 VIEW COMPARISON:  08/29/2018. FINDINGS: Poor inspiration with improvement. Mildly decreased bibasilar atelectasis. Small bilateral posterior effusions. Stable borderline enlarged cardiac silhouette and post CABG changes. The right jugular catheter sheath has been removed. No pneumothorax. Moderate bilateral AC joint degenerative spur formation. IMPRESSION: 1. Mildly improved bibasilar atelectasis. 2. Small bilateral pleural effusions. Electronically Signed   By: Claudie Revering M.D.   On: 08/30/2018 08:36   Dg Chest 2 View  Result Date: 08/23/2018 CLINICAL DATA:  Shortness of breath. EXAM: CHEST - 2 VIEW COMPARISON:  08/21/2018 FINDINGS: The heart size and mediastinal contours are within normal limits. Both lungs are clear. The visualized skeletal structures are unremarkable. IMPRESSION: No active cardiopulmonary disease. Electronically Signed   By: Kerby Moors M.D.   On: 08/23/2018 08:21   Dg Chest Port 1 View  Result Date: 08/29/2018 CLINICAL DATA:  Evaluate for pleural effusion. EXAM: PORTABLE CHEST 1 VIEW COMPARISON:  08/28/2018 FINDINGS:  Previous exam report raised concern for a right apical pneumothorax. There is no convincing evidence for a pneumothorax on this examination. Again noted is a right jugular central line with the tip in the upper SVC region. New streaky densities in the periphery of the left lung could represent atelectasis. Persistent haziness at the left lung base is suggestive for volume loss and pleural effusion. Heart size is stable with post CABG  changes. IMPRESSION: 1. No evidence for pneumothorax. 2. New streaky densities in the left lung periphery are most compatible with atelectasis. 3. Persistent densities at left lung base are compatible with volume loss and possible small left pleural effusion. Electronically Signed   By: Markus Daft M.D.   On: 08/29/2018 08:23   Dg Chest Port 1 View  Result Date: 08/28/2018 CLINICAL DATA:  Sore chest after CABG EXAM: PORTABLE CHEST 1 VIEW COMPARISON:  Yesterday FINDINGS: Small right apical pneumothorax is newly seen. Chest tubes and Swan-Ganz catheter have been removed. Small left effusion. Low volumes with atelectasis. Normal heart size. Status post CABG. IMPRESSION: 1. Small right apical pneumothorax. 2. Low volume chest with atelectasis. Electronically Signed   By: Monte Fantasia M.D.   On: 08/28/2018 09:25   Dg Chest Port 1 View  Result Date: 08/27/2018 CLINICAL DATA:  Short chest, CABG EXAM: PORTABLE CHEST 1 VIEW COMPARISON:  11/18/9 FINDINGS: Interval extubation. Swan-Ganz catheter, LEFT chest tube, and mediastinal drain remain. Low lung volumes. No pneumothorax. Mild LEFT basilar atelectasis. Basilar atelectasis improved. IMPRESSION: 1. Interval extubation. 2. Improvement in basilar atelectasis. 3. Stable additional support apparatus including LEFT chest tube with no pneumothorax. Electronically Signed   By: Suzy Bouchard M.D.   On: 08/27/2018 09:06   Dg Chest Port 1 View  Result Date: 08/26/2018 CLINICAL DATA:  Status post CABG. Assess support lines and tubes. EXAM: PORTABLE CHEST 1 VIEW COMPARISON:  08/23/2018 FINDINGS: New median sternotomy sutures are in place. Heart size is top-normal with mild uncoiling of nonaneurysmal, slightly atherosclerotic aorta. Interstitial edema is noted with small left greater than right pleural effusions. A gastric tube is seen with side port at the GE juncture. Further advancement is recommended. Endotracheal tube tip is seen 17 mm above the carina at the  level of the aortic arch. Swan-Ganz catheter projects over the mediastinum possibly within left main pulmonary artery. There appear to be chest tubes in place one along the periphery of the left lung with tip projecting over the posterior left fourth rib and an additional 2 projecting over mediastinum retracting over the T5 vertebral level. IMPRESSION: 1. Support line and tube positions as above. 2. Interstitial edema with small left greater than right pleural effusions. 3. No pneumothorax. Electronically Signed   By: Ashley Royalty M.D.   On: 08/26/2018 14:51   Dg Chest Portable 1 View  Result Date: 08/21/2018 CLINICAL DATA:  Acute onset of sudden chest burning sensation. EXAM: PORTABLE CHEST 1 VIEW COMPARISON:  Chest radiograph performed 11/20/2007 FINDINGS: The lungs are well-aerated. Mild vascular congestion is noted. Peribronchial thickening is seen. There is no evidence of focal opacification, pleural effusion or pneumothorax. The cardiomediastinal silhouette is within normal limits. No acute osseous abnormalities are seen. IMPRESSION: Mild vascular congestion noted. Peribronchial thickening seen. Electronically Signed   By: Garald Balding M.D.   On: 08/21/2018 03:52   Vas US Doppler Pre Cabg  Result Date: 08/25/2018 PREOPERATIVE VASCULAR EVALUATION  Indications: Pre-CABG. Performing Technologist: Carlos Levering Rvt  Examination Guidelines: A complete evaluation includes B-mode imaging, spectral Doppler, color  Doppler, and power Doppler as needed of all accessible portions of each vessel. Bilateral testing is considered an integral part of a complete examination. Limited examinations for reoccurring indications may be performed as noted.  Right Carotid Findings: +----------+--------+-------+--------+----------------------+------------------+           PSV cm/sEDV    StenosisDescribe              Comments                             cm/s                                                     +----------+--------+-------+--------+----------------------+------------------+ CCA Prox  82      18                                   intimal thickening +----------+--------+-------+--------+----------------------+------------------+ CCA Distal65      18             smooth and                                                                heterogenous                             +----------+--------+-------+--------+----------------------+------------------+ ICA Prox  106     30             smooth and                                                                heterogenous                             +----------+--------+-------+--------+----------------------+------------------+ ICA Distal79      29                                   tortuous           +----------+--------+-------+--------+----------------------+------------------+ ECA       93      10                                                      +----------+--------+-------+--------+----------------------+------------------+ Portions of this table do not appear on this page. +----------+--------+-------+--------+------------+           PSV cm/sEDV cmsDescribeArm Pressure +----------+--------+-------+--------+------------+ Subclavian140                    136          +----------+--------+-------+--------+------------+ +---------+--------+--+--------+--+---------+  VertebralPSV cm/s72EDV cm/s27Antegrade +---------+--------+--+--------+--+---------+ Left Carotid Findings: +----------+--------+--------+--------+-----------------------+--------+           PSV cm/sEDV cm/sStenosisDescribe               Comments +----------+--------+--------+--------+-----------------------+--------+ CCA Prox  96      24              smooth and heterogenous         +----------+--------+--------+--------+-----------------------+--------+ CCA Distal87      21              smooth and  heterogenous         +----------+--------+--------+--------+-----------------------+--------+ ICA Prox  51      15                                              +----------+--------+--------+--------+-----------------------+--------+ ICA Distal70      24                                     tortuous +----------+--------+--------+--------+-----------------------+--------+ ECA       92      10                                              +----------+--------+--------+--------+-----------------------+--------+ +----------+--------+--------+--------+------------+ SubclavianPSV cm/sEDV cm/sDescribeArm Pressure +----------+--------+--------+--------+------------+           111                     126          +----------+--------+--------+--------+------------+ +---------+--------+--+--------+--+---------+ VertebralPSV cm/s75EDV cm/s23Antegrade +---------+--------+--+--------+--+---------+  ABI Findings: +--------+------------------+-----+---------+--------+ Right   Rt Pressure (mmHg)IndexWaveform Comment  +--------+------------------+-----+---------+--------+ [id number]                    triphasic         +--------+------------------+-----+---------+--------+ PTA     142               1.04 triphasic         +--------+------------------+-----+---------+--------+ DP      117               0.86 triphasic         +--------+------------------+-----+---------+--------+ +--------+------------------+-----+---------+-------+ Left    Lt Pressure (mmHg)IndexWaveform Comment +--------+------------------+-----+---------+-------+ [id number]                    triphasic        +--------+------------------+-----+---------+-------+ PTA     127               0.93 triphasic        +--------+------------------+-----+---------+-------+ DP      137                    triphasic        +--------+------------------+-----+---------+-------+  +-------+---------------+----------------+ ABI/TBIToday's ABI/TBIPrevious ABI/TBI +-------+---------------+----------------+ Right  1.04                            +-------+---------------+----------------+ Left   1.01                            +-------+---------------+----------------+  Right Doppler Findings: +-----------+--------+-----+---------+-----------------------------------------+ Site       PressureIndexDoppler  Comments                                  +-----------+--------+-----+---------+-----------------------------------------+ Brachial   136          triphasic                                          +-----------+--------+-----+---------+-----------------------------------------+ Radial                  triphasic                                          +-----------+--------+-----+---------+-----------------------------------------+ Ulnar                   triphasic                                          +-----------+--------+-----+---------+-----------------------------------------+ Palmar Arch                      Palmar waveforms are obliterated with                                      radial and ulnar compression.             +-----------+--------+-----+---------+-----------------------------------------+  Left Doppler Findings: +-----------+--------+-----+---------+-----------------------------------------+ Site       PressureIndexDoppler  Comments                                  +-----------+--------+-----+---------+-----------------------------------------+ Brachial   126          triphasic                                          +-----------+--------+-----+---------+-----------------------------------------+ Radial                  triphasic                                          +-----------+--------+-----+---------+-----------------------------------------+ Ulnar                   triphasic                                           +-----------+--------+-----+---------+-----------------------------------------+ Palmar Arch                      Palmar waveforms are obliterated with  radial and ulnar compression.             +-----------+--------+-----+---------+-----------------------------------------+  Summary: Right Carotid: Velocities in the right ICA are consistent with a 1-39% stenosis. Left Carotid: Velocities in the left ICA are consistent with a 1-39% stenosis. Vertebrals: Bilateral vertebral arteries demonstrate antegrade flow. Right ABI: Resting right ankle-brachial index is within normal range. No evidence of significant right lower extremity arterial disease. Left ABI: Resting left ankle-brachial index is within normal range. No evidence of significant left lower extremity arterial disease.  Electronically signed by Ruta Hinds MD on 08/25/2018 at 9:24:36 AM.    Final    Discharge Instructions    Amb Referral to Cardiac Rehabilitation   Complete by:  As directed    Diagnosis:   CABG NSTEMI     CABG X ___:  4      Discharge Medications: Allergies as of 08/31/2018      Reactions   Shellfish Allergy Hives   Avocado    UNSPECIFIED REACTION    Advair Diskus [fluticasone-salmeterol] Palpitations   Cortisone Other (See Comments)   drowsiness   Pecan Nut (diagnostic) Itching   Salmon [fish Allergy] Rash   Rash and itching of ears      Medication List    TAKE these medications   acetaminophen 500 MG tablet Commonly known as:  TYLENOL Take 2 tablets (1,000 mg total) by mouth every 6 (six) hours as needed.   albuterol 108 (90 Base) MCG/ACT inhaler Commonly known as:  PROVENTIL HFA;VENTOLIN HFA Inhale 2 puffs into the lungs every 6 (six) hours as needed for wheezing or shortness of breath.   aspirin 325 MG EC tablet Take 1 tablet (325 mg total) by mouth daily.   atorvastatin 80 MG tablet Commonly known as:  LIPITOR Take 1 tablet (80  mg total) by mouth daily at 6 PM.   blood glucose meter kit and supplies Kit Dispense based on patient and insurance preference. Use up to four times daily as directed. For E11.65 Check blood glucose 3 times weekly on MWF   Cinnamon 500 MG Tabs Take 500 mg by mouth daily.   furosemide 40 MG tablet Commonly known as:  LASIX Take 1 tablet (40 mg total) by mouth daily. For 7 days   glimepiride 2 MG tablet Commonly known as:  AMARYL Take 1 tablet (2 mg total) by mouth daily before breakfast.   guaiFENesin 600 MG 12 hr tablet Commonly known as:  MUCINEX Take 1 tablet (600 mg total) by mouth 2 (two) times daily as needed.   ipratropium 0.06 % nasal spray Commonly known as:  ATROVENT Place 2 sprays into both nostrils 4 (four) times daily.   metFORMIN 500 MG tablet Commonly known as:  GLUCOPHAGE TAKE 2 TABLETS BY MOUTH ONCE EVERY MORNING AND 1 TABLET ONCE EVERY EVENING What changed:  See the new instructions.   metoprolol tartrate 25 MG tablet Commonly known as:  LOPRESSOR Take 1 tablet (25 mg total) by mouth 2 (two) times daily.   potassium chloride SA 20 MEQ tablet Commonly known as:  K-DUR,KLOR-CON Take 1 tablet (20 mEq total) by mouth daily.   sitaGLIPtin 50 MG tablet Commonly known as:  JANUVIA Take 1 tablet (50 mg total) by mouth daily.   traMADol 50 MG tablet Commonly known as:  ULTRAM Take 1-2 tablets (50-100 mg total) by mouth every 4 (four) hours as needed for moderate pain.      The patient has been discharged on:   1.Beta Blocker:  Yes [  x ]                              No   [   ]                              If No, reason:  2.Ace Inhibitor/ARB: Yes [   ]                                     No  [ x   ]                                     If No, reason: labile BP  3.Statin:   Yes [ x  ]                  No  [   ]                  If No, reason:  4.Ecasa:  Yes  [  x ]                  No   [   ]                  If No, reason:  Follow Up  Appointments: Follow-up Information    Mikey College, NP. Call.   Specialty:  Nurse Practitioner Why:  for a follow up appointment regarding further diabetes management and surveillance of HGA1C 8.5 Contact information: [address] [place] [address] [phone]        Ivin Poot, MD. Go on 09/30/2018.   Specialty:  Cardiothoracic Surgery Why:  PA/LAT CXR to be taken (at [facility] which is in the same building as Dr. Lucianne Lei Trigt's office) on 09/30/2018 at 12:30 pm;Appointment time is at 1:00 pm Contact information: [address] [place] [address] [phone]        Theora Gianotti, NP. Go on 09/12/2018.   Specialties:  Nurse Practitioner, Cardiology, Radiology Why:   Appointment time is at 10:00 am Contact information: [address] [place] [address] [phone]           Signed: Ellamae Sia 08/31/2018, 9:45 AM patient examined and medical record reviewed,agree with above note. Tharon Aquas Trigt III 09/08/2018

## 2018-08-28 ENCOUNTER — Inpatient Hospital Stay (HOSPITAL_COMMUNITY): Payer: Managed Care, Other (non HMO)

## 2018-08-28 LAB — CBC
HCT: 28.4 % — ABNORMAL LOW (ref 36.0–46.0)
Hemoglobin: 9.3 g/dL — ABNORMAL LOW (ref 12.0–15.0)
MCH: 30.2 pg (ref 26.0–34.0)
MCHC: 32.7 g/dL (ref 30.0–36.0)
MCV: 92.2 fL (ref 80.0–100.0)
Platelets: 153 10*3/uL (ref 150–400)
RBC: 3.08 MIL/uL — ABNORMAL LOW (ref 3.87–5.11)
RDW: 13.8 % (ref 11.5–15.5)
WBC: 10.3 10*3/uL (ref 4.0–10.5)
nRBC: 0 % (ref 0.0–0.2)

## 2018-08-28 LAB — COOXEMETRY PANEL
Carboxyhemoglobin: 1.7 % — ABNORMAL HIGH (ref 0.5–1.5)
Methemoglobin: 2.1 % — ABNORMAL HIGH (ref 0.0–1.5)
O2 Saturation: 62.2 %
Total hemoglobin: 9.9 g/dL — ABNORMAL LOW (ref 12.0–16.0)

## 2018-08-28 LAB — GLUCOSE, CAPILLARY
Glucose-Capillary: 149 mg/dL — ABNORMAL HIGH (ref 70–99)
Glucose-Capillary: 131 mg/dL — ABNORMAL HIGH (ref 70–99)
Glucose-Capillary: 201 mg/dL — ABNORMAL HIGH (ref 70–99)
Glucose-Capillary: 176 mg/dL — ABNORMAL HIGH (ref 70–99)
Glucose-Capillary: 199 mg/dL — ABNORMAL HIGH (ref 70–99)
Glucose-Capillary: 153 mg/dL — ABNORMAL HIGH (ref 70–99)

## 2018-08-28 LAB — BASIC METABOLIC PANEL
Anion gap: 8 (ref 5–15)
BUN: 9 mg/dL (ref 8–23)
CO2: 23 mmol/L (ref 22–32)
Calcium: 8 mg/dL — ABNORMAL LOW (ref 8.9–10.3)
Chloride: 105 mmol/L (ref 98–111)
Creatinine, Ser: 0.98 mg/dL (ref 0.44–1.00)
GFR calc Af Amer: >60 mL/min (ref >60)
GFR calc non Af Amer: >60 mL/min (ref >60)
Glucose, Bld: 154 mg/dL — ABNORMAL HIGH (ref 70–99)
Potassium: 3.8 mmol/L (ref 3.5–5.1)
Sodium: 136 mmol/L (ref 135–145)

## 2018-08-28 MED ORDER — FUROSEMIDE 40 MG PO TABS: 40.0000 mg | ORAL_TABLET | Freq: Every day | ORAL | Status: DC

## 2018-08-28 MED ORDER — LACTULOSE 10 GM/15ML PO SOLN: 30.0000 g | Freq: Once | ORAL | Status: DC

## 2018-08-28 MED ORDER — SORBITOL 70 % SOLN
20.0000 mL | Freq: Once | Status: AC
  Administered 2018-08-28: 20 mL via ORAL
  Filled 2018-08-28: qty 30

## 2018-08-28 MED ORDER — LACTULOSE 10 GM/15ML PO SOLN
30.0000 g | Freq: Once | ORAL | Status: AC
  Administered 2018-08-29: 30 g via ORAL
  Filled 2018-08-28: qty 45

## 2018-08-28 MED ORDER — SIMETHICONE 80 MG PO CHEW: 80.0000 mg | CHEWABLE_TABLET | Freq: Four times a day (QID) | ORAL | Status: DC | PRN

## 2018-08-28 MED ORDER — METOPROLOL TARTRATE 25 MG PO TABS
25.0000 mg | ORAL_TABLET | Freq: Two times a day (BID) | ORAL | Status: DC
  Administered 2018-08-28 – 2018-08-31 (×7): 25 mg via ORAL
  Filled 2018-08-28 (×7): qty 1

## 2018-08-28 MED ORDER — FUROSEMIDE 10 MG/ML IJ SOLN
40.0000 mg | Freq: Once | INTRAMUSCULAR | Status: AC
  Administered 2018-08-28: 40 mg via INTRAVENOUS
  Filled 2018-08-28: qty 4

## 2018-08-28 MED ORDER — INSULIN DETEMIR 100 UNIT/ML ~~LOC~~ SOLN
15.0000 [IU] | Freq: Two times a day (BID) | SUBCUTANEOUS | Status: DC
  Administered 2018-08-28: 15 [IU] via SUBCUTANEOUS
  Filled 2018-08-28 (×2): qty 0.15

## 2018-08-28 MED ORDER — POTASSIUM CHLORIDE CRYS ER 20 MEQ PO TBCR
40.0000 meq | EXTENDED_RELEASE_TABLET | Freq: Once | ORAL | Status: AC
  Administered 2018-08-28: 40 meq via ORAL
  Filled 2018-08-28: qty 2

## 2018-08-28 MED ORDER — GLUCERNA SHAKE PO LIQD
237.0000 mL | Freq: Three times a day (TID) | ORAL | Status: DC
  Administered 2018-08-28 – 2018-08-30 (×5): 237 mL via ORAL

## 2018-08-28 MED ORDER — METOCLOPRAMIDE HCL 5 MG/ML IJ SOLN
10.0000 mg | Freq: Four times a day (QID) | INTRAMUSCULAR | Status: AC
  Administered 2018-08-28 (×2): 10 mg via INTRAVENOUS
  Filled 2018-08-28 (×2): qty 2

## 2018-08-28 MED ORDER — METOPROLOL TARTRATE 25 MG/10 ML ORAL SUSPENSION: 25.0000 mg | Freq: Two times a day (BID) | ORAL | Status: DC

## 2018-08-28 MED ORDER — INSULIN ASPART 100 UNIT/ML ~~LOC~~ SOLN
0.0000 [IU] | Freq: Three times a day (TID) | SUBCUTANEOUS | Status: DC
  Administered 2018-08-28: 8 [IU] via SUBCUTANEOUS
  Administered 2018-08-28 (×2): 2 [IU] via SUBCUTANEOUS
  Administered 2018-08-28: 4 [IU] via SUBCUTANEOUS
  Administered 2018-08-29 – 2018-08-30 (×3): 2 [IU] via SUBCUTANEOUS
  Administered 2018-08-31: 4 [IU] via SUBCUTANEOUS

## 2018-08-28 NOTE — Progress Notes (Addendum)
TCTS DAILY ICU PROGRESS NOTE                   [address]            [place] [id number]          [phone]   2 Days Post-Op Procedure(s) (LRB): CORONARY ARTERY BYPASS GRAFTING (CABG) times four using left internal mammary artery and right leg saphenous vein grafts (N/A) TRANSESOPHAGEAL ECHOCARDIOGRAM (TEE) (N/A)  Total Length of Stay:  LOS: 7 days   Subjective: Patient sitting in chair. She already walked this am.  Objective: Vital signs in last 24 hours: Temp:  [98.3 F (36.8 C)-98.9 F (37.2 C)] 98.7 F (37.1 C) (11/20 0400) Pulse Rate:  [73-96] 93 (11/20 0700) Cardiac Rhythm: Normal sinus rhythm (11/19 1953) Resp:  [15-36] 20 (11/20 0700) BP: (111-132)/(48-86) 132/62 (11/20 0500) SpO2:  [89 %-98 %] 96 % (11/20 0700) Arterial Line BP: (140-151)/(51-60) 144/60 (11/19 0900)  Filed Weights   08/25/18 0515 08/26/18 0358 08/27/18 0500  Weight: 74.1 kg 73.5 kg 79.6 kg    Weight change:    Hemodynamic parameters for last 24 hours: PAP: (23-33)/(9-20) 33/20 CO:  [4.5 L/min] 4.5 L/min CI:  [2.6 L/min/m2] 2.6 L/min/m2  Intake/Output from previous day: 11/19 0701 - 11/20 0700 In: 828.2 [P.O.:480; I.V.:348.2] Out: 2295 [Urine:2295]  Intake/Output this shift: No intake/output data recorded.  Current Meds: Scheduled Meds: . acetaminophen  1,000 mg Oral Q6H   Or  . acetaminophen (TYLENOL) oral liquid 160 mg/5 mL  1,000 mg Per Tube Q6H  . aspirin EC  325 mg Oral Daily   Or  . aspirin  324 mg Per Tube Daily  . atorvastatin  80 mg Oral q1800  . bisacodyl  10 mg Oral Daily   Or  . bisacodyl  10 mg Rectal Daily  . Chlorhexidine Gluconate Cloth  6 each Topical Daily  . docusate sodium  200 mg Oral Daily  . guaiFENesin  600 mg Oral BID  . insulin aspart  0-24 Units Subcutaneous Q4H  . insulin aspart  3 Units Subcutaneous TID WC  . insulin detemir  12 Units Subcutaneous BID  . metoCLOPramide (REGLAN) injection  10 mg Intravenous Q6H  . metoprolol  tartrate  12.5 mg Per Tube BID  . mupirocin ointment  1 application Nasal BID  . pantoprazole  40 mg Oral Daily  . simethicone  80 mg Oral QID  . sodium chloride flush  10-40 mL Intracatheter Q12H   Continuous Infusions: . lactated ringers 10 mL/hr at 08/28/18 0600  . milrinone 0.25 mcg/kg/min (08/28/18 0600)  . phenylephrine (NEO-SYNEPHRINE) Adult infusion Stopped (08/26/18 2334)   PRN Meds:.metoprolol tartrate, midazolam, morphine injection, ondansetron (ZOFRAN) IV, oxyCODONE, sodium chloride flush, traMADol  General appearance: alert, cooperative and no distress Neurologic: intact Heart: RRR Lungs: Slightly diminished at bases Abdomen: Soft, non tender, occasional bowel sounds Extremities: Mild bilateral LE edema Wound: Aquacel dry and intact. RLE wounds are clean and dry  Lab Results: CBC: Recent Labs    08/27/18 1703 08/27/18 1727 08/28/18 0326  WBC 9.5  --  10.3  HGB 10.0* 10.2* 9.3*  HCT 30.7* 30.0* 28.4*  PLT 152  --  153   BMET:  Recent Labs    08/27/18 0306  08/27/18 1727 08/28/18 0326  NA 138  --  139 136  K 3.9  --  4.2 3.8  CL 109  --  106 105  CO2 22  --   --  23  GLUCOSE 124*  --  133* 154*  BUN 7*  --  8 9  CREATININE 0.85   < > 0.80 0.98  CALCIUM 8.1*  --   --  8.0*   < > = values in this interval not displayed.    CMET: Lab Results  Component Value Date   WBC 10.3 08/28/2018   HGB 9.3 (L) 08/28/2018   HCT 28.4 (L) 08/28/2018   PLT 153 08/28/2018   GLUCOSE 154 (H) 08/28/2018   CHOL 252 (H) 08/21/2018   TRIG 360 (H) 08/21/2018   HDL 33 (L) 08/21/2018   LDLCALC 147 (H) 08/21/2018   ALT 18 08/23/2018   AST 27 08/23/2018   NA 136 08/28/2018   K 3.8 08/28/2018   CL 105 08/28/2018   CREATININE 0.98 08/28/2018   BUN 9 08/28/2018   CO2 23 08/28/2018   TSH 2.242 08/23/2018   INR 1.32 08/26/2018   HGBA1C 8.5 (H) 08/23/2018   MICROALBUR o 07/20/2016      PT/INR:  Recent Labs    08/26/18 1430  LABPROT 16.3*  INR 1.32    Radiology: No results found.   Assessment/Plan: S/P Procedure(s) (LRB): CORONARY ARTERY BYPASS GRAFTING (CABG) times four using left internal mammary artery and right leg saphenous vein grafts (N/A) TRANSESOPHAGEAL ECHOCARDIOGRAM (TEE) (N/A)  1. CV-S/p NSTEMI. SR in the 90's this am. On Milrinone drip and Lopressor 12.5 mg bid. Co ox this am 62.2 so will decrease Milrinone.  Will increase Lopressor to 25 mg bid. 2. Pulmonary-on 2 liters of oxygen this am. Will wean oxygen as able. CXR this am appears to show bibasilar atelectasis and small pleural effusions. Mucinex for cough. Encourage incentive spirometer. 3. Volume overload-will give 40 mg IV Lasix this am 4. ABL anemia-H and H 9.3 and 28.4 this am 5. DM-CBGs 166/140/149. On Insulin. Pre op HGA1C 8.5. She will need close medical follow up after discharge. On Insulin drip and will transition to scheduled Insulin. Will restart Metformin and Glimeperide once tolerating oral better. 6. Supplement potassium 7. LOC constipation-no bowel movement since 11/17  Donielle Margaretann Loveless PA-C 08/28/2018 7:34 AM   Looks good this am Will reduce milrinone to .125, transfer to stepdown Cont diuresis with po lasix tomorrow  patient examined and medical record reviewed,agree with above note. Kathlee Nations Trigt III 08/28/2018

## 2018-08-28 NOTE — Op Note (Signed)
NAME: Vinson MoselleGOMEZ, Beulah D. MEDICAL RECORD [id number] ACCOUNT [id number] DATE OF BIRTH:1956/08/26 FACILITY: [facility] LOCATION: [facility] PHYSICIAN:Mozell Hardacre VAN TRIGT III, MD  OPERATIVE REPORT  DATE OF PROCEDURE:  08/26/2018  PROCEDURE PERFORMED:  Coronary artery bypass grafting x4 (left internal mammary artery to left anterior descending, saphenous vein graft to ramus intermediate, saphenous vein graft to obtuse marginal, saphenous vein graft to right coronary artery).  SURGEON:  Kathlee NationsPeter Van Trigt III, MD  ASSISTANT:  Doree Fudgeonielle Zimmerman, PA-C  ANESTHESIA:  General.  PREOPERATIVE DIAGNOSES:  Non-ST elevation myocardial infarction, severe 3-vessel coronary artery disease, diabetes mellitus, preserved left ventricular systolic function.  POSTOPERATIVE DIAGNOSES:   Non-ST elevation myocardial infarction, severe 3-vessel coronary artery disease, diabetes mellitus, preserved left ventricular systolic function.  INDICATIONS:  The patient is a 62 year old female who presented with chest pain and positive cardiac enzymes.  She was transferred to this hospital to be evaluated for multivessel CABG.  The patient has severe diabetes, diabetic pattern of coronary  artery disease with chronic occlusion of the RCA and diffuse high-grade stenosis of the LAD and circumflex systems.  I evaluated the patient after reviewing the films of her cardiac catheterization and echocardiogram and agreed with the recommendation  for CABG.  I discussed the procedure of CABG in detail with the patient and her family including the expected benefits of improved survival, relief of symptoms of angina, and preservation of LV function.  I discussed the details of surgery including the  use of general anesthesia and cardiopulmonary bypass, the location of the surgical incisions, and the expected postoperative hospital recovery.  I reviewed with the patient the risks to her coronary artery bypass surgery including the risks of bleeding,   stroke, blood transfusion requirement, MI, postoperative infection, postoperative pulmonary problems including pleural effusion, postoperative organ failure, and postoperative death.  After reviewing these issues, she demonstrated her understanding and  agreed to proceed with surgery under what I felt was an informed consent.  OPERATIVE FINDINGS: 1.  Suboptimal diabetic pattern targets. 2.  Adequate conduit retrieved from the right leg with endoscopic harvest and internal mammary artery harvest. 3.  One unit packed cell required for intraoperative anemia, hemoglobin of 6 g.  DESCRIPTION OF PROCEDURE:  The patient was brought from preop holding after informed consent was documented and all final questions addressed.  She was placed supine on the operating table, and general anesthesia was induced under invasive hemodynamic  monitoring.  A transesophageal echo probe was placed by the anesthesia team.  The patient's chest, abdomen and legs were prepped with Betadine, draped as a sterile field.  A sternal incision was made as the saphenous vein was harvested endoscopically  from the right leg.  The left internal mammary artery was harvested as a pedicle graft.  The sternal retractor was placed after the pericardium was opened and suspended.  Deep blades were needed because of the obese body habitus.  Heparin was  administered, and the pursestrings were placed in the ascending aorta and right atrium.  After the ACT was therapeutic, the patient was cannulated and placed on bypass.  The coronaries were identified for grafting.  They were all heavily and diffusely  diseased with suboptimal, but graftable targets.  The saphenous vein and mammary artery were prepared for the distal anastomoses, and cardioplegia cannulas were placed both antegrade and retrograde cold blood cardioplegia.  The patient was cooled to 32  degrees, and the aortic crossclamp was applied.  One liter of cold blood cardioplegia was  delivered in split doses  between the antegrade aortic and retrograde coronary sinus catheters.  There was good cardioplegic arrest, and supple temperature dropped  less than 14 degrees.  Cardioplegia was delivered every 20 minutes.  The distal coronary anastomoses were performed.  The first distal anastomosis was the distal RCA.  It was totally occluded proximally.  It was a 1.4 mm vessel.  It had thick walls with calcium.  A reverse saphenous vein was sewn end-to-side with running  7-0 Prolene with good flow through the graft.  Cardioplegia was redosed.  The second distal anastomosis was the distal OM branch of the circumflex.  It was a 1.4 mm vessel with diffuse disease.  A reverse saphenous vein was sewn end-to-side with running 7-0 Prolene with adequate flow through the graft.  Cardioplegia was  redosed.  The third distal anastomosis was placed in the ramus intermediate branch of the left coronary.  This was a 1.4 mm vessel, proximal 80% stenosis.  A reverse saphenous vein was sewn end-to-side with running 7-0 Prolene with good flow through the graft.   Cardioplegia was redosed.  The fourth anastomosis was to the distal LAD, which was diffusely diseased.  The mammary artery was brought through an opening in the left lateral pericardium and was brought down onto the LAD and sewn end-to-side with running 8-0 Prolene.  There was  good flow through the anastomosis after briefly releasing the pedicle bulldog and the mammary artery.  The bulldog was reapplied, and the pedicle was secured to the epicardium with 6-0 Prolenes.  Cardioplegia was redosed.  While the crossclamp was still in place, 3 proximal vein anastomoses were performed on the ascending aorta using a 4.5 mm punch and running 6-0 Prolene.  Prior to tying down the final proximal anastomosis, air was vented from the coronaries with a dose  of retrograde warm blood cardioplegia.  The crossclamp was removed.  The patient was rewarmed and  reperfused.  The heart resumed a spontaneous rhythm.  The vein grafts were deaired and opened and each had good flow, and hemostasis was documented at the proximal and distal sites.  The patient had temporary pacing wires  applied.  The lung was reexpanded.  The ventilator was resumed.  When the patient was adequately rewarmed and reperfused, she was weaned from cardiopulmonary bypass without difficulty.  Echo showed preserved LV systolic function.  Hemodynamics were  stable.  Protamine was administered without adverse reaction.  The cannulas were removed.  The mediastinum was irrigated with warm saline.  The superior pericardial fat was closed over the aorta.  The anterior mediastinum and left pleural chest tubes  were placed and brought out through separate incisions.  The sternum was closed with a wire.  The patient remained stable.  The pectoralis fascia was closed with a running #1 Vicryl.  The patient remained stable.  The subcutaneous and skin layers were  closed with a running Vicryl and sterile dressings were applied.  The patient was transferred to ICU in stable condition.  Total cardiopulmonary bypass time was 154 minutes.  LN/NUANCE  D:08/27/2018 T:08/28/2018 JOB:[id number]

## 2018-08-28 NOTE — Progress Notes (Signed)
      [address]       [place] [id number]             [phone]      POD # 2 CABG  Resting comfortably  BP 112/71 (BP Location: Left Arm)   Pulse (!) 101   Temp 99.4 F (37.4 C) (Oral)   Resp 19   Ht 5\' 1"  (1.549 m)   Wt 71.9 kg   SpO2 92%   BMI 29.95 kg/m   Intake/Output Summary (Last 24 hours) at 08/28/2018 1755 Last data filed at 08/28/2018 1706 Gross per 24 hour  Intake 784.12 ml  Output 3195 ml  Net -2410.88 ml   CBG mildly elevated  Good response to diuresis  Viviann SpareSteven C. Dorris FetchHendrickson, MD [facility] [phone]

## 2018-08-29 ENCOUNTER — Inpatient Hospital Stay (HOSPITAL_COMMUNITY): Payer: Managed Care, Other (non HMO)

## 2018-08-29 LAB — COOXEMETRY PANEL
Carboxyhemoglobin: 1.5 % (ref 0.5–1.5)
Methemoglobin: 1.9 % — ABNORMAL HIGH (ref 0.0–1.5)
O2 Saturation: 62.5 %
Total hemoglobin: 9.2 g/dL — ABNORMAL LOW (ref 12.0–16.0)

## 2018-08-29 LAB — GLUCOSE, CAPILLARY
Comment 1: 1
Comment 2: 4
Glucose-Capillary: 125 mg/dL — ABNORMAL HIGH (ref 70–99)
Glucose-Capillary: 125 mg/dL — ABNORMAL HIGH (ref 70–99)
Glucose-Capillary: 59 mg/dL — ABNORMAL LOW (ref 70–99)
Glucose-Capillary: 68 mg/dL — ABNORMAL LOW (ref 70–99)
Glucose-Capillary: 69 mg/dL — ABNORMAL LOW (ref 70–99)
Glucose-Capillary: 85 mg/dL (ref 70–99)
Glucose-Capillary: 88 mg/dL (ref 70–99)

## 2018-08-29 LAB — CBC
HCT: 27 % — ABNORMAL LOW (ref 36.0–46.0)
Hemoglobin: 8.8 g/dL — ABNORMAL LOW (ref 12.0–15.0)
MCH: 30.1 pg (ref 26.0–34.0)
MCHC: 32.6 g/dL (ref 30.0–36.0)
MCV: 92.5 fL (ref 80.0–100.0)
Platelets: 163 10*3/uL (ref 150–400)
RBC: 2.92 MIL/uL — ABNORMAL LOW (ref 3.87–5.11)
RDW: 13.8 % (ref 11.5–15.5)
WBC: 8.7 10*3/uL (ref 4.0–10.5)
nRBC: 0 % (ref 0.0–0.2)

## 2018-08-29 LAB — TYPE AND SCREEN
ABO/RH(D): O POS
Antibody Screen: NEGATIVE
Unit division: 0
Unit division: 0

## 2018-08-29 LAB — BASIC METABOLIC PANEL
Anion gap: 6 (ref 5–15)
BUN: 13 mg/dL (ref 8–23)
CO2: 27 mmol/L (ref 22–32)
Calcium: 7.9 mg/dL — ABNORMAL LOW (ref 8.9–10.3)
Chloride: 103 mmol/L (ref 98–111)
Creatinine, Ser: 0.82 mg/dL (ref 0.44–1.00)
GFR calc Af Amer: >60 mL/min (ref >60)
GFR calc non Af Amer: >60 mL/min (ref >60)
Glucose, Bld: 114 mg/dL — ABNORMAL HIGH (ref 70–99)
Potassium: 3.8 mmol/L (ref 3.5–5.1)
Sodium: 136 mmol/L (ref 135–145)

## 2018-08-29 LAB — BPAM RBC
Blood Product Expiration Date: 201912152359
Blood Product Expiration Date: 201912152359
ISSUE DATE / TIME: 201911180858
ISSUE DATE / TIME: 201911180858
Unit Type and Rh: 5100
Unit Type and Rh: 5100

## 2018-08-29 MED ORDER — ENOXAPARIN SODIUM 30 MG/0.3ML ~~LOC~~ SOLN
30.0000 mg | SUBCUTANEOUS | Status: DC
  Administered 2018-08-29 – 2018-08-30 (×2): 30 mg via SUBCUTANEOUS
  Filled 2018-08-29 (×2): qty 0.3

## 2018-08-29 MED ORDER — METOLAZONE 2.5 MG PO TABS
2.5000 mg | ORAL_TABLET | Freq: Once | ORAL | Status: AC
  Administered 2018-08-29: 2.5 mg via ORAL
  Filled 2018-08-29: qty 1

## 2018-08-29 MED ORDER — GLIMEPIRIDE 2 MG PO TABS
2.0000 mg | ORAL_TABLET | Freq: Every day | ORAL | Status: DC
  Administered 2018-08-29: 2 mg via ORAL
  Filled 2018-08-29: qty 1

## 2018-08-29 MED ORDER — POTASSIUM CHLORIDE CRYS ER 20 MEQ PO TBCR
20.0000 meq | EXTENDED_RELEASE_TABLET | Freq: Once | ORAL | Status: AC
  Administered 2018-08-29: 20 meq via ORAL
  Filled 2018-08-29: qty 1

## 2018-08-29 MED ORDER — FUROSEMIDE 10 MG/ML IJ SOLN
40.0000 mg | Freq: Once | INTRAMUSCULAR | Status: AC
  Administered 2018-08-29: 40 mg via INTRAVENOUS
  Filled 2018-08-29: qty 4

## 2018-08-29 MED ORDER — METFORMIN HCL 500 MG PO TABS
500.0000 mg | ORAL_TABLET | Freq: Two times a day (BID) | ORAL | Status: DC
  Administered 2018-08-29 – 2018-08-31 (×4): 500 mg via ORAL
  Filled 2018-08-29 (×4): qty 1

## 2018-08-29 MED ORDER — FUROSEMIDE 40 MG PO TABS
40.0000 mg | ORAL_TABLET | Freq: Every day | ORAL | Status: DC
  Administered 2018-08-30 – 2018-08-31 (×2): 40 mg via ORAL
  Filled 2018-08-29 (×2): qty 1

## 2018-08-29 MED FILL — Electrolyte-R (PH 7.4) Solution: INTRAVENOUS | Qty: 5000 | Status: AC

## 2018-08-29 MED FILL — Heparin Sodium (Porcine) Inj 1000 Unit/ML: INTRAMUSCULAR | Qty: 10 | Status: AC

## 2018-08-29 MED FILL — Lidocaine HCl(Cardiac) IV PF Soln Pref Syr 100 MG/5ML (2%): INTRAVENOUS | Qty: 5 | Status: AC

## 2018-08-29 MED FILL — Sodium Bicarbonate IV Soln 8.4%: INTRAVENOUS | Qty: 50 | Status: AC

## 2018-08-29 MED FILL — Mannitol IV Soln 20%: INTRAVENOUS | Qty: 500 | Status: AC

## 2018-08-29 MED FILL — Sodium Chloride IV Soln 0.9%: INTRAVENOUS | Qty: 2000 | Status: AC

## 2018-08-29 NOTE — Progress Notes (Signed)
Hypoglycemic Event  CBG: 59 at 1605  Treatment: Patient given 4 oz. Orange juice  Symptoms: None  Follow-up CBG: Time: 1620 CBG Result: 68  Possible Reasons for Event: Patient re-started on home metformin and Amaryl this AM, still receiving sliding scale Novolog coverage, has had lower than average appetite, and had large amount of stool output this AM after dose of lactulose.  Comments/MD notified: Repeat 4 oz. Of orange juice after 2nd CBG result. Re-checked CBG at 1635, up to 85. Bartle MD notified during rounds of hypoglycemic event and Amaryl was discontinued. Will continue to monitor.    Dillard'sMadison E Savaya Roth

## 2018-08-29 NOTE — Progress Notes (Addendum)
TCTS DAILY ICU PROGRESS NOTE                   [address]            [place] [id number]          [phone]   3 Days Post-Op Procedure(s) (LRB): CORONARY ARTERY BYPASS GRAFTING (CABG) times four using left internal mammary artery and right leg saphenous vein grafts (N/A) TRANSESOPHAGEAL ECHOCARDIOGRAM (TEE) (N/A)  Total Length of Stay:  LOS: 8 days   Subjective: Patient had bowel movement yesterday. She already walked this am.  Objective: Vital signs in last 24 hours: Temp:  [98.9 F (37.2 C)-99.4 F (37.4 C)] 99.1 F (37.3 C) (11/21 0400) Pulse Rate:  [80-101] 86 (11/21 0700) Cardiac Rhythm: Normal sinus rhythm (11/21 0400) Resp:  [12-23] 23 (11/21 0700) BP: (98-133)/(52-98) 133/60 (11/21 0700) SpO2:  [92 %-96 %] 94 % (11/21 0700) Weight:  [77 kg] 77 kg (11/21 0500)  Filed Weights   08/27/18 0500 08/28/18 0500 08/29/18 0500  Weight: 79.6 kg 71.9 kg 77 kg    Weight change: 5.1 kg    Intake/Output from previous day: 11/20 0701 - 11/21 0700 In: 1036.9 [P.O.:717; I.V.:319.9] Out: 1800 [Urine:1800]  Intake/Output this shift: No intake/output data recorded.  Current Meds: Scheduled Meds: . acetaminophen  1,000 mg Oral Q6H   Or  . acetaminophen (TYLENOL) oral liquid 160 mg/5 mL  1,000 mg Per Tube Q6H  . aspirin EC  325 mg Oral Daily   Or  . aspirin  324 mg Per Tube Daily  . atorvastatin  80 mg Oral q1800  . bisacodyl  10 mg Oral Daily   Or  . bisacodyl  10 mg Rectal Daily  . Chlorhexidine Gluconate Cloth  6 each Topical Daily  . docusate sodium  200 mg Oral Daily  . enoxaparin (LOVENOX) injection  30 mg Subcutaneous Q24H  . feeding supplement (GLUCERNA SHAKE)  237 mL Oral TID BM  . furosemide  40 mg Intravenous Once  . [START ON 08/30/2018] furosemide  40 mg Oral Daily  . guaiFENesin  600 mg Oral BID  . insulin aspart  0-24 Units Subcutaneous TID AC & HS  . insulin aspart  3 Units Subcutaneous TID WC  . insulin detemir  15 Units  Subcutaneous BID  . metolazone  2.5 mg Oral Once  . metoprolol tartrate  25 mg Oral BID  . mupirocin ointment  1 application Nasal BID  . pantoprazole  40 mg Oral Daily  . sodium chloride flush  10-40 mL Intracatheter Q12H   Continuous Infusions: . lactated ringers 10 mL/hr at 08/29/18 0700   PRN Meds:.ondansetron (ZOFRAN) IV, simethicone, sodium chloride flush, traMADol  Heart: RRR Lungs: Slightly diminished at bases Abdomen: Soft, non tender,  bowel sounds present Extremities: Mild bilateral LE edema Wound: Aquacel removed and sternal wound is clean and dry.  RLE wounds are clean and dry Neurologic: intact  Lab Results: CBC: Recent Labs    08/28/18 0326 08/29/18 0342  WBC 10.3 8.7  HGB 9.3* 8.8*  HCT 28.4* 27.0*  PLT 153 163   BMET:  Recent Labs    08/28/18 0326 08/29/18 0342  NA 136 136  K 3.8 3.8  CL 105 103  CO2 23 27  GLUCOSE 154* 114*  BUN 9 13  CREATININE 0.98 0.82  CALCIUM 8.0* 7.9*    CMET: Lab Results  Component Value Date   WBC 8.7 08/29/2018   HGB 8.8 (L)  08/29/2018   HCT 27.0 (L) 08/29/2018   PLT 163 08/29/2018   GLUCOSE 114 (H) 08/29/2018   CHOL 252 (H) 08/21/2018   TRIG 360 (H) 08/21/2018   HDL 33 (L) 08/21/2018   LDLCALC 147 (H) 08/21/2018   ALT 18 08/23/2018   AST 27 08/23/2018   NA 136 08/29/2018   K 3.8 08/29/2018   CL 103 08/29/2018   CREATININE 0.82 08/29/2018   BUN 13 08/29/2018   CO2 27 08/29/2018   TSH 2.242 08/23/2018   INR 1.32 08/26/2018   HGBA1C 8.5 (H) 08/23/2018   MICROALBUR o 07/20/2016      PT/INR:  Recent Labs    08/26/18 1430  LABPROT 16.3*  INR 1.32   Radiology: No results found.   Assessment/Plan: S/P Procedure(s) (LRB): CORONARY ARTERY BYPASS GRAFTING (CABG) times four using left internal mammary artery and right leg saphenous vein grafts (N/A) TRANSESOPHAGEAL ECHOCARDIOGRAM (TEE) (N/A)  1. CV-S/p NSTEMI. SR in the 90's this am. On Milrinone drip and Lopressor 12.5 mg bid. Co ox this am  62.5-hope to stop Milrinone.  Will increase Lopressor to 25 mg bid. 2. Pulmonary-on room air this am. CXR this am appears stable. Mucinex for cough. Encourage incentive spirometer. 3. Volume overload-On Lasix 40 mg IV and Metolazone 2.5 mg daily 4. ABL anemia-H and H 8.8 and 27 this am 5. DM-CBGs 176/199/153. On Insulin. Pre op HGA1C 8.5. She will need close medical follow up after discharge. On Insulin drip and will transition to scheduled Insulin. Will restart low dose Metformin and Glimeperide  6. Supplement potassium 7. Hope to transfer   Ardelle BallsDonielle M Zimmerman PA-C 08/29/2018 7:45 AM   Patient doing well Will wean off milrinone and remove central line Transfer to telemetry I have examined Olivia BleakMaria Roth and agree with above assessment and plan.

## 2018-08-29 NOTE — Progress Notes (Signed)
Patient ID: Olivia Roth, female   DOB: [birth year], 62 y.o.   MRN: [id number] TCTS Evening Rounds:  Hemodynamically stable Developed hypoglycemia to 68 this afternoon after oral meds and SSI. She was apparently not taking Amaryl at home. Will stop it for now and continue metformin and SSI.  Awaiting bed on 4E.

## 2018-08-30 ENCOUNTER — Inpatient Hospital Stay (HOSPITAL_COMMUNITY): Payer: Managed Care, Other (non HMO)

## 2018-08-30 LAB — BASIC METABOLIC PANEL
Anion gap: 8 (ref 5–15)
BUN: 14 mg/dL (ref 8–23)
CO2: 26 mmol/L (ref 22–32)
Calcium: 8.4 mg/dL — ABNORMAL LOW (ref 8.9–10.3)
Chloride: 103 mmol/L (ref 98–111)
Creatinine, Ser: 0.86 mg/dL (ref 0.44–1.00)
GFR calc Af Amer: >60 mL/min (ref >60)
GFR calc non Af Amer: >60 mL/min (ref >60)
Glucose, Bld: 100 mg/dL — ABNORMAL HIGH (ref 70–99)
Potassium: 3.6 mmol/L (ref 3.5–5.1)
Sodium: 137 mmol/L (ref 135–145)

## 2018-08-30 LAB — GLUCOSE, CAPILLARY
Glucose-Capillary: 111 mg/dL — ABNORMAL HIGH (ref 70–99)
Glucose-Capillary: 169 mg/dL — ABNORMAL HIGH (ref 70–99)
Glucose-Capillary: 109 mg/dL — ABNORMAL HIGH (ref 70–99)
Glucose-Capillary: 97 mg/dL (ref 70–99)

## 2018-08-30 LAB — CBC
HCT: 27.8 % — ABNORMAL LOW (ref 36.0–46.0)
Hemoglobin: 9.2 g/dL — ABNORMAL LOW (ref 12.0–15.0)
MCH: 30.2 pg (ref 26.0–34.0)
MCHC: 33.1 g/dL (ref 30.0–36.0)
MCV: 91.1 fL (ref 80.0–100.0)
Platelets: 227 10*3/uL (ref 150–400)
RBC: 3.05 MIL/uL — ABNORMAL LOW (ref 3.87–5.11)
RDW: 13.5 % (ref 11.5–15.5)
WBC: 8.3 10*3/uL (ref 4.0–10.5)
nRBC: 0 % (ref 0.0–0.2)

## 2018-08-30 MED ORDER — POTASSIUM CHLORIDE CRYS ER 20 MEQ PO TBCR
20.0000 meq | EXTENDED_RELEASE_TABLET | Freq: Every day | ORAL | Status: DC
  Administered 2018-08-30 – 2018-08-31 (×2): 20 meq via ORAL
  Filled 2018-08-30 (×2): qty 1

## 2018-08-30 MED ORDER — INFLUENZA VAC SPLIT QUAD 0.5 ML IM SUSY
0.5000 mL | PREFILLED_SYRINGE | INTRAMUSCULAR | Status: AC
  Administered 2018-08-31: 0.5 mL via INTRAMUSCULAR
  Filled 2018-08-30: qty 0.5

## 2018-08-30 MED ORDER — DEXMEDETOMIDINE HCL IN NACL 200 MCG/50ML IV SOLN: 0.2000 ug/kg/h | INTRAVENOUS | Status: DC

## 2018-08-30 NOTE — Plan of Care (Signed)

## 2018-08-30 NOTE — Progress Notes (Signed)
Patient arrived from 2 Heart. She is alert and oriented. Midsternal open to air, clean, dry and intact. Rt leg incision clean dry and intact. Placed patient on telemetry, CCMD notified, 2nd verification. Call bell placed within reach of patient. She is resting comfortably in bed. Will continue to monitor patient.

## 2018-08-30 NOTE — Progress Notes (Signed)
4 Days Post-Op Procedure(s) (LRB): CORONARY ARTERY BYPASS GRAFTING (CABG) times four using left internal mammary artery and right leg saphenous vein grafts (N/A) TRANSESOPHAGEAL ECHOCARDIOGRAM (TEE) (N/A) Subjective: Progressing after CABGx4 EPWS out waiting for 4E telemetry bed Plan home sunday  Objective: Vital signs in last 24 hours: Temp:  [98.1 F (36.7 C)-98.8 F (37.1 C)] 98.4 F (36.9 C) (11/22 0700) Pulse Rate:  [72-92] 84 (11/22 0700) Cardiac Rhythm: Normal sinus rhythm (11/22 0400) Resp:  [15-26] 18 (11/22 0700) BP: (83-128)/(46-76) 121/69 (11/22 0600) SpO2:  [92 %-99 %] 97 % (11/22 0700)  Hemodynamic parameters for last 24 hours:  nsr  Intake/Output from previous day: 11/21 0701 - 11/22 0700 In: 283 [P.O.:240; I.V.:43] Out: -  Intake/Output this shift: No intake/output data recorded.       Exam    General- alert and comfortable    Neck- no JVD, no cervical adenopathy palpable, no carotid bruit   Lungs- clear without rales, wheezes   Cor- regular rate and rhythm, no murmur , gallop   Abdomen- soft, non-tender   Extremities - warm, non-tender, minimal edema   Neuro- oriented, appropriate, no focal weakness   Lab Results: Recent Labs    08/29/18 0342 08/30/18 0304  WBC 8.7 8.3  HGB 8.8* 9.2*  HCT 27.0* 27.8*  PLT 163 227   BMET:  Recent Labs    08/29/18 0342 08/30/18 0304  NA 136 137  K 3.8 3.6  CL 103 103  CO2 27 26  GLUCOSE 114* 100*  BUN 13 14  CREATININE 0.82 0.86  CALCIUM 7.9* 8.4*    PT/INR: No results for input(s): LABPROT, INR in the last 72 hours. ABG    Component Value Date/Time   PHART 7.335 (L) 08/26/2018 1901   HCO3 22.7 08/26/2018 1901   TCO2 20 (L) 08/27/2018 1727   ACIDBASEDEF 3.0 (H) 08/26/2018 1901   O2SAT 62.5 08/29/2018 0340   CBG (last 3)  Recent Labs    08/29/18 1635 08/29/18 2135 08/30/18 0659  GLUCAP 85 88 111*    Assessment/Plan: S/P Procedure(s) (LRB): CORONARY ARTERY BYPASS GRAFTING (CABG) times  four using left internal mammary artery and right leg saphenous vein grafts (N/A) TRANSESOPHAGEAL ECHOCARDIOGRAM (TEE) (N/A) DC instructions reviewed with patient CBG under better control Home Sunday  LOS: 9 days    Kathlee Nationseter Van Trigt III 08/30/2018

## 2018-08-30 NOTE — Progress Notes (Addendum)
Inpatient Diabetes Program Recommendations  AACE/ADA: New Consensus Statement on Inpatient Glycemic Control (2015)  Target Ranges:  Prepandial:   less than 140 mg/dL      Peak postprandial:   less than 180 mg/dL (1-2 hours)      Critically ill patients:  140 - 180 mg/dL   Lab Results  Component Value Date   GLUCAP 169 (H) 08/30/2018   HGBA1C 8.5 (H) 08/23/2018    Review of Glycemic Control Results for Vinson MoselleGOMEZ, Reighan D (MRN [id number]) as of 08/30/2018 11:58  Ref. Range 08/29/2018 08:01 08/29/2018 12:03 08/29/2018 16:04 08/29/2018 16:18 08/29/2018 16:34 08/29/2018 16:35 08/29/2018 21:35 08/30/2018 06:59 08/30/2018 11:12  Glucose-Capillary Latest Ref Range: 70 - 99 mg/dL 409125 (H) 811125 (H) 59 (L) 68 (L) 69 (L) 85 88 111 (H) 169 (H)   Diabetes history: Type 2 DM  Outpatient Diabetes medications:  Metformin 1000 mg q AM and 500 mg q PM Current orders for Inpatient glycemic control:  Novolog TCTS tid with meals and HS, Metformin 500 mg bid Inpatient Diabetes Program Recommendations:   Please consider reducing Novolog correction to sensitive tid with meals and HS.  Also note that A1C is >goal.  May need additional oral agent such as Tradjenta 5 mg daily or Januvia 50 mg daily? Both of these medications have a low risk for hypoglycemia.   Thanks,  Beryl MeagerJenny Daniyal Tabor, RN, BC-ADM Inpatient Diabetes Coordinator Pager [phone] (8a-5p)

## 2018-08-30 NOTE — Progress Notes (Signed)
Epicardial pacing wires removed per protocol. Bed rest started at 0910.

## 2018-08-30 NOTE — Progress Notes (Signed)
CARDIAC REHAB PHASE I   PRE:  Rate/Rhythm: 89 SR    MODE:  Ambulation: 470 ft   POST:  Rate/Rhythm: 95 SR  BP:  Supine:   Sitting: 112/98  Standing:    SaO2: 95%RA 1311-1411 Pt walked 470 ft on RA with hand held asst. A little wobbly but I do not think will need walker for home. Encouraged third walk with staff or family. Adjusted walker in room for her to use if needed. Education completed with pt, daughter and son who voiced understanding. Reviewed staying in the tube, IS use, ex ed and carb counting and heart healthy food choices. Wrote down how to view discharge video. [facility] CRP [facility]    Olivia Nuttingharlene Jashiya Bassett, RN BSN  08/30/2018 2:10 PM

## 2018-08-30 NOTE — Progress Notes (Signed)
Patient ambulated 470 ft in hallway with family using rolling walker.  Tolerated well and states this is her third walk today. Pt resting with call bell within reach.  Will continue to monitor. Olivia Roth, Olivia Roth c

## 2018-08-31 DIAGNOSIS — Z951 Presence of aortocoronary bypass graft: Secondary | ICD-10-CM

## 2018-08-31 LAB — BASIC METABOLIC PANEL
Anion gap: 10 (ref 5–15)
BUN: 17 mg/dL (ref 8–23)
CO2: 28 mmol/L (ref 22–32)
Calcium: 8.5 mg/dL — ABNORMAL LOW (ref 8.9–10.3)
Chloride: 97 mmol/L — ABNORMAL LOW (ref 98–111)
Creatinine, Ser: 1.03 mg/dL — ABNORMAL HIGH (ref 0.44–1.00)
GFR calc Af Amer: >60 mL/min (ref >60)
GFR calc non Af Amer: 57 mL/min — ABNORMAL LOW (ref >60)
Glucose, Bld: 106 mg/dL — ABNORMAL HIGH (ref 70–99)
Potassium: 3.5 mmol/L (ref 3.5–5.1)
Sodium: 135 mmol/L (ref 135–145)

## 2018-08-31 LAB — CBC
HCT: 29.7 % — ABNORMAL LOW (ref 36.0–46.0)
Hemoglobin: 9.5 g/dL — ABNORMAL LOW (ref 12.0–15.0)
MCH: 29.2 pg (ref 26.0–34.0)
MCHC: 32 g/dL (ref 30.0–36.0)
MCV: 91.4 fL (ref 80.0–100.0)
Platelets: 242 10*3/uL (ref 150–400)
RBC: 3.25 MIL/uL — ABNORMAL LOW (ref 3.87–5.11)
RDW: 13.2 % (ref 11.5–15.5)
WBC: 5.3 10*3/uL (ref 4.0–10.5)
nRBC: 0 % (ref 0.0–0.2)

## 2018-08-31 LAB — GLUCOSE, CAPILLARY
Glucose-Capillary: 120 mg/dL — ABNORMAL HIGH (ref 70–99)
Glucose-Capillary: 190 mg/dL — ABNORMAL HIGH (ref 70–99)

## 2018-08-31 MED ORDER — ACETAMINOPHEN 500 MG PO TABS: 1000.0000 mg | ORAL_TABLET | Freq: Four times a day (QID) | ORAL | 0 refills | Status: AC | PRN

## 2018-08-31 MED ORDER — ASPIRIN 325 MG PO TBEC: 325.0000 mg | DELAYED_RELEASE_TABLET | Freq: Every day | ORAL | 0 refills | Status: DC

## 2018-08-31 MED ORDER — ATORVASTATIN CALCIUM 80 MG PO TABS: 80.0000 mg | ORAL_TABLET | Freq: Every day | ORAL | 3 refills | Status: DC

## 2018-08-31 MED ORDER — TRAMADOL HCL 50 MG PO TABS: 50.0000 mg | ORAL_TABLET | ORAL | 0 refills | Status: DC | PRN

## 2018-08-31 MED ORDER — POTASSIUM CHLORIDE CRYS ER 20 MEQ PO TBCR: 20.0000 meq | EXTENDED_RELEASE_TABLET | Freq: Every day | ORAL | 0 refills | Status: DC

## 2018-08-31 MED ORDER — METOPROLOL TARTRATE 25 MG PO TABS: 25.0000 mg | ORAL_TABLET | Freq: Two times a day (BID) | ORAL | 3 refills | Status: DC

## 2018-08-31 MED ORDER — GUAIFENESIN ER 600 MG PO TB12: 600.0000 mg | ORAL_TABLET | Freq: Two times a day (BID) | ORAL | 0 refills | Status: DC | PRN

## 2018-08-31 MED ORDER — FUROSEMIDE 40 MG PO TABS: 40.0000 mg | ORAL_TABLET | Freq: Every day | ORAL | 0 refills | Status: DC

## 2018-08-31 NOTE — Progress Notes (Signed)
Chest tube sutures removed as ordered steri strips applied. IV and tele dcd. Patient given discharge instructions medication list and prescriptions were sent to patient personal pharmacy. Follow up appointments given and patient/ family verbalized understanding. Will discharge home as ordered. Transported to exit with wheelchair and nursing staff. Joslyn Ramos, Randall AnKristin Jessup RN

## 2018-08-31 NOTE — Progress Notes (Signed)
0945 Pt just walked and has no questions re ed done yesterday. Hoping to go home. Can walk with staff or family if not discharged. Luetta NuttingCharlene Vicenta Olds RN BSN 08/31/2018 9:43 AM

## 2018-08-31 NOTE — Progress Notes (Signed)
Patient ambulated in hallway with family and walkerx2 times this AM. Yariana Hoaglund, Randall AnKristin Jessup RN

## 2018-08-31 NOTE — Progress Notes (Signed)
      [address]       [place] [id number]             [phone]      5 Days Post-Op Procedure(s) (LRB): CORONARY ARTERY BYPASS GRAFTING (CABG) times four using left internal mammary artery and right leg saphenous vein grafts (N/A) TRANSESOPHAGEAL ECHOCARDIOGRAM (TEE) (N/A)   Subjective:  Patient looks and feels great.  Shes ambulating independently in the hallways.  She really wants to go home today.  +BM  Objective: Vital signs in last 24 hours: Temp:  [97.4 F (36.3 C)-99.1 F (37.3 C)] 97.6 F (36.4 C) (11/23 0422) Pulse Rate:  [77-84] 84 (11/22 2057) Cardiac Rhythm: Normal sinus rhythm (11/23 0700) Resp:  [18-25] 25 (11/23 0422) BP: (129-135)/(62-77) 131/77 (11/23 0422) SpO2:  [96 %-98 %] 98 % (11/23 0422) Weight:  [73.1 kg] 73.1 kg (11/23 0422)  Intake/Output from previous day: 11/22 0701 - 11/23 0700 In: 240 [P.O.:240] Out: -   General appearance: alert, cooperative and no distress Heart: regular rate and rhythm Lungs: clear to auscultation bilaterally Abdomen: soft, non-tender; bowel sounds normal; no masses,  no organomegaly Extremities: edema trace Wound: clean and dry  Lab Results: Recent Labs    08/30/18 0304 08/31/18 0333  WBC 8.3 5.3  HGB 9.2* 9.5*  HCT 27.8* 29.7*  PLT 227 242   BMET:  Recent Labs    08/30/18 0304 08/31/18 0333  NA 137 135  K 3.6 3.5  CL 103 97*  CO2 26 28  GLUCOSE 100* 106*  BUN 14 17  CREATININE 0.86 1.03*  CALCIUM 8.4* 8.5*    PT/INR: No results for input(s): LABPROT, INR in the last 72 hours. ABG    Component Value Date/Time   PHART 7.335 (L) 08/26/2018 1901   HCO3 22.7 08/26/2018 1901   TCO2 20 (L) 08/27/2018 1727   ACIDBASEDEF 3.0 (H) 08/26/2018 1901   O2SAT 62.5 08/29/2018 0340   CBG (last 3)  Recent Labs    08/30/18 1648 08/30/18 2113 08/31/18 0601  GLUCAP 109* 97 120*    Assessment/Plan: S/P Procedure(s) (LRB): CORONARY ARTERY BYPASS GRAFTING (CABG) times four using left  internal mammary artery and right leg saphenous vein grafts (N/A) TRANSESOPHAGEAL ECHOCARDIOGRAM (TEE) (N/A)  1. CV- NSR, BP controlled- continue Lopressor 2. Pulm- no acute issues, continue IS 3. Renal- creatinine WNL, weight is trending down, will taper lasix 4. DM- sugars controlled 6. Dispo- patient stable, in NSR, will plan to d/c today if okay with Dr. Cornelius Moraswen   LOS: 10 days    Lowella DandyErin Kawena Lyday 08/31/2018

## 2018-09-02 ENCOUNTER — Telehealth: Payer: Self-pay

## 2018-09-02 ENCOUNTER — Telehealth: Payer: Self-pay | Admitting: Nurse Practitioner

## 2018-09-02 DIAGNOSIS — E1165 Type 2 diabetes mellitus with hyperglycemia: Secondary | ICD-10-CM

## 2018-09-02 MED ORDER — SITAGLIPTIN PHOSPHATE 50 MG PO TABS: 50.0000 mg | ORAL_TABLET | Freq: Every day | ORAL | 0 refills | Status: DC

## 2018-09-02 MED ORDER — GLIMEPIRIDE 2 MG PO TABS: 2.0000 mg | ORAL_TABLET | Freq: Every day | ORAL | 0 refills | Status: DC

## 2018-09-02 MED ORDER — METFORMIN HCL 500 MG PO TABS: ORAL_TABLET | ORAL | 0 refills | Status: DC

## 2018-09-02 NOTE — Telephone Encounter (Signed)
Pt recently had heart attack and bypass surgery.  I scheduled an appt for tomorrow but daughter, Zollie ScaleOlivia said while she was in hospital they were having a hard time controlling blood sugar.  She is out of metformin and sugar is around 200.  Please call Zollie ScaleOlivia [phone]

## 2018-09-02 NOTE — Telephone Encounter (Signed)
Refills of all Diabetes meds have been sent.

## 2018-09-02 NOTE — Telephone Encounter (Signed)
She should resume Januiva 50 mg once daily, metformin 500 mg bid with meals, and glimepride 2 mg daily with breakfast if she has not already done so.  We can address this at her visit tomorrow.

## 2018-09-02 NOTE — Telephone Encounter (Signed)
Transition Care Management Follow-up Telephone Call  Date of discharge and from where: [facility]  How have you been since you were released from the hospital? She has been feeling sore when she walks  Any questions or concerns? Yes , blood sugars have been high and would like to get those controlled  Items Reviewed:  Did the pt receive and understand the discharge instructions provided? Yes   Medications obtained and verified? Yes   Any new allergies since your discharge? No   Dietary orders reviewed? Yes  Do you have support at home? Yes , lives with husband. Daughter is staying with her for right now  Other (ie: DME, Home Health, etc) N/a  Functional Questionnaire: (I = Independent and D = Dependent) ADL's: I  Bathing/Dressing- I   Meal Prep- I w/ assist  Eating- I  Maintaining continence- I  Transferring/Ambulation- I  Managing Meds- I w/ assist   Follow up appointments reviewed:    PCP Hospital f/u appt confirmed? Yes  Scheduled to see Kyung RuddKennedy on 09/03/2018 @ 10am.  [facility]  Scheduled to see Cardiology on 12/5 and 12/23.  Are transportation arrangements needed? No   If their condition worsens, is the pt aware to call  their PCP or go to the ED? Yes  Was the patient provided with contact information for the PCP's office or ED? Yes  Was the pt encouraged to call back with questions or concerns? Yes

## 2018-09-02 NOTE — Telephone Encounter (Signed)
I just received a phone call from the patient husband Kandis Mannan(Omar) with concerns about his wife blood sugar reading. Her blood sugar reading yesterday after about hour after eating was 235 mcg , 215mcg. Her fasting blood sugar this morning is 184 mcg. The pt was just recently discharged from the hospital x 1 week ago for MI with a CABG. She have an appointment scheduled for tomorrow Nov 26th for hospital follow up. Please advise

## 2018-09-02 NOTE — Telephone Encounter (Signed)
I spoke with the patient and her daughter who informed me that the patient have not been taking the Januvia and Glimepiride. Pt was advise per Leotis ShamesLauren that she needs to start the medications and keep her appointment scheduled for tomorrow. She verbalize understanding.

## 2018-09-03 ENCOUNTER — Other Ambulatory Visit: Payer: Self-pay

## 2018-09-03 ENCOUNTER — Ambulatory Visit (INDEPENDENT_AMBULATORY_CARE_PROVIDER_SITE_OTHER): Payer: Managed Care, Other (non HMO) | Admitting: Nurse Practitioner

## 2018-09-03 ENCOUNTER — Encounter: Payer: Self-pay | Admitting: Nurse Practitioner

## 2018-09-03 DIAGNOSIS — E1165 Type 2 diabetes mellitus with hyperglycemia: Secondary | ICD-10-CM

## 2018-09-03 MED ORDER — METFORMIN HCL 1000 MG PO TABS: 1000.0000 mg | ORAL_TABLET | Freq: Two times a day (BID) | ORAL | 1 refills | Status: DC

## 2018-09-03 MED ORDER — LISINOPRIL 2.5 MG PO TABS: 2.5000 mg | ORAL_TABLET | Freq: Every day | ORAL | 1 refills | Status: DC

## 2018-09-03 NOTE — Patient Instructions (Addendum)
Olivia Roth,   Thank you for coming in to clinic today. 1.TAKE metformin 1,000 mg twice daily. - START glimepiride 2 mg once daily - START Januvia 50 mg once daily  2. Take atorvastatin 80 mg once daily.  3. Start taking ferrous sulfate 325 mg one tablet every other day or every Monday, Wednesday, and Friday if it is too difficult to keep the pattern of every other day. - WAIT until you have stopped all pain meds.  4. START lisinopril 2.5 mg once daily for kidney protection for diabetes. - This can also be used for blood pressure, but this dose should not change your blood pressure much.  Some of the possible side effects are:  - angioedema: swelling of lips, mouth, and tongue.  If this rare side effect occurs, please go to ED. - cough: you could develop a dry, hacking cough caused by this medicine.  If it occurs, it will go away after stopping this medicine.  Call the clinic before stopping the medication. - kidney damage: we will monitor your labs when we start this medicine and at least one time per year.  If you do not have an change in kidney function when starting this medicine, it will provide kidney protection over time. - RECHECK kidney function 2 weeks after starting. You may come here to [facility] or have this done at cardiology.  5. CBG goals: 70-120.  Less than 70 is too low.   Please schedule a follow-up appointment with Cassell Smiles, AGNP. Return for diabetes with sugar and food log.  If you have any other questions or concerns, please feel free to call the clinic or send a message through [place]. You may also schedule an earlier appointment if necessary.  You will receive a survey after today's visit either digitally by e-mail or paper by [employer]. Your experiences and feedback matter to [place].  Please respond so we know how we are doing as we provide care for you.   Cassell Smiles, DNP, AGNP-BC Adult Gerontology Nurse Practitioner [facility],  [facility]    Diabetes Mellitus and Standards of Medical Care Managing diabetes (diabetes mellitus) can be complicated. Your diabetes treatment may be managed by a team of health care providers, including:  A diet and nutrition specialist (registered dietitian).  A nurse.  A certified diabetes educator (CDE).  A diabetes specialist (endocrinologist).  An eye doctor.  A primary care provider.  A dentist.  Your health care providers follow a schedule in order to help you get the best quality of care. The following schedule is a general guideline for your diabetes management plan. Your health care providers may also give you more specific instructions. HbA1c ( hemoglobin A1c) test This test provides information about blood sugar (glucose) control over the previous 2-3 months. It is used to check whether your diabetes management plan needs to be adjusted.  If you are meeting your treatment goals, this test is done at least 2 times a year.  If you are not meeting treatment goals or if your treatment goals have changed, this test is done 4 times a year.  Blood pressure test  This test is done at every routine medical visit. For most people, the goal is less than 130/80. Ask your health care provider what your goal blood pressure should be. Dental and eye exams  Visit your dentist two times a year.  If you have type 1 diabetes, get an eye exam 3-5 years after you are  diagnosed, and then once a year after your first exam. ? If you were diagnosed with type 1 diabetes as a child, get an eye exam when you are age 31 or older and have had diabetes for 3-5 years. After the first exam, you should get an eye exam once a year.  If you have type 2 diabetes, have an eye exam as soon as you are diagnosed, and then once a year after your first exam. Foot care exam  Visual foot exams are done at every routine medical visit. The exams check for cuts, bruises, redness, blisters, sores, or other  problems with the feet.  A complete foot exam is done by your health care provider once a year. This exam includes an inspection of the structure and skin of your feet, and a check of the pulses and sensation in your feet. ? Type 1 diabetes: Get your first exam 3-5 years after diagnosis. ? Type 2 diabetes: Get your first exam as soon as you are diagnosed.  Check your feet every day for cuts, bruises, redness, blisters, or sores. If you have any of these or other problems that are not healing, contact your health care provider. Kidney function test ( urine microalbumin)  This test is done once a year. ? Type 1 diabetes: Get your first test 5 years after diagnosis. ? Type 2 diabetes: Get your first test as soon as you are diagnosed.  If you have chronic kidney disease (CKD), get a serum creatinine and estimated glomerular filtration rate (eGFR) test once a year. Lipid profile (cholesterol, HDL, LDL, triglycerides)  This test should be done when you are diagnosed with diabetes, and every 5 years after the first test. If you are on medicines to lower your cholesterol, you may need to get this test done every year. ? The goal for LDL is less than 100 mg/dL (5.5 mmol/L). If you are at high risk, the goal is less than 70 mg/dL (3.9 mmol/L). ? The goal for HDL is 40 mg/dL (2.2 mmol/L) for men and 50 mg/dL(2.8 mmol/L) for women. An HDL cholesterol of 60 mg/dL (3.3 mmol/L) or higher gives some protection against heart disease. ? The goal for triglycerides is less than 150 mg/dL (8.3 mmol/L). Immunizations  The yearly flu (influenza) vaccine is recommended for everyone 6 months or older who has diabetes.  The pneumonia (pneumococcal) vaccine is recommended for everyone 2 years or older who has diabetes. If you are 93 or older, you may get the pneumonia vaccine as a series of two separate shots.  The hepatitis B vaccine is recommended for adults shortly after they have been diagnosed with  diabetes.  The Tdap (tetanus, diphtheria, and pertussis) vaccine should be given: ? According to normal childhood vaccination schedules, for children. ? Every 10 years, for adults who have diabetes.  The shingles vaccine is recommended for people who have had chicken pox and are 50 years or older. Mental and emotional health  Screening for symptoms of eating disorders, anxiety, and depression is recommended at the time of diagnosis and afterward as needed. If your screening shows that you have symptoms (you have a positive screening result), you may need further evaluation and be referred to a mental health care provider. Diabetes self-management education  Education about how to manage your diabetes is recommended at diagnosis and ongoing as needed. Treatment plan  Your treatment plan will be reviewed at every medical visit. Summary  Managing diabetes (diabetes mellitus) can be complicated. Your diabetes  treatment may be managed by a team of health care providers.  Your health care providers follow a schedule in order to help you get the best quality of care.  Standards of care including having regular physical exams, blood tests, blood pressure monitoring, immunizations, screening tests, and education about how to manage your diabetes.  Your health care providers may also give you more specific instructions based on your individual health. This information is not intended to replace advice given to you by your health care provider. Make sure you discuss any questions you have with your health care provider. Document Released: 07/23/2009 Document Revised: 06/23/2016 Document Reviewed: 06/23/2016 Elsevier Interactive Patient Education  [employer].

## 2018-09-03 NOTE — Progress Notes (Signed)
Subjective:    Patient ID: Olivia Roth, female    DOB: [birth year], 62 y.o.   MRN: [id number]  Olivia Roth is a 62 y.o. female presenting on 09/03/2018 for Hospitalization Follow-up (MI, CABG) and Diabetes (blood sugar reading averaging 160-252mg)  Patient is accompanied today by her husband OMarinus Maw  We also have her daughter who will be primary caregiver over next 6 weeks on phone during majority of visit for questions, education, and instructions.  HPI Hospital Follow-up  Hospital/Location: [facility] after transfer for CABG Date of Admission: 08/21/2018 Date of Discharge: 08/31/2018 Transitions of care telephone call: 09/02/2018  Reason for Admission: Chest pain Primary (+Secondary) Diagnosis: NSTEMI, CAD native heart with angina  [facility] - Patient presents today 3 days after recent hospitalization. Brief summary of recent course, patient had symptoms of burning chest pain for 2-3 days prior to seeking care.  She was then hospitalized, labs confirmed elevated Troponin I with peak at 3.93. Patient then underwent cardiac catheterization and was recommended to have CABG for severe 3 vessel disease.  LVEF was found to be 30-35% at that time. - Today reports overall has done well after discharge. Symptoms of anginal chest pain have resolved.  She now has persistent sternal surgical pain.  She has also had persistently elevated CBG.  Notes prior to admission, morning CBG were around 260s.  Now 150-200 with diet changes. She did not start DM medications at discharge as recommended.  She states she was waiting until she came to this visit to verify need.   - New medications on discharge: glimepiride 2 mg once daily in am with breakfast, atorvastatin 80 mg once daily. - Changes to current medications.  Diabetes Pt presents today for follow up of Type 2 diabetes mellitus. She states we had discussed she was at risk for MI at prior  visits, she "regrets not taking it seriously."  Now desires to focus on future care and not past to prevent future ASCVD events and complications of TY7CW  Patient still has not started any medications other than her metformin.  She brings 4 days of blood sugars with her to clinic. AM fasting CBG readings 167-188 am Last 2 days: Lunch: 203, after dinner 203; Lunch 161, before dinner 194  She also has a food log of the last 2 days for review with significantly high carb servings, low vegetable intake. - She reports no low CBGs since being home.  - Current diabetic medications include: metformin 1,000 mg po with breakfast, 500 mg at supper.  Patient has tolerated this well.  Patient also admits she had never started taking her Januvia after last visit in December 2018. - She is not currently symptomatic.  - She denies polydipsia, polyphagia, polyuria, headaches, diaphoresis, shakiness, chills, pain, numbness or tingling in extremities and changes in vision.   - Clinical course has been unchanged. - She  reports no regular exercise routine. - Her diet is moderate in salt, moderate in fat, and high in carbohydrates.  States she doesn't eat vegetables, so her diet changes have been really hard.  Today's breakfast included 6 carb servings and very little protein.  PREVENTION: Eye exam current (within one year): no Foot exam current (within one year): no  Lipid/ASCVD risk reduction - on statin: yes Kidney protection - on ace or arb: no Recent Labs    10/05/17 0946 08/21/18 0626 08/23/18 0448  HGBA1C 10.9 8.4* 8.5*  Social History   Tobacco Use  . Smoking status: Never Smoker  . Smokeless tobacco: Never Used  Substance Use Topics  . Alcohol use: Yes    Comment: 08/21/2018 "glass of sangria or a beer 3-4 times/year"  . Drug use: Never    Review of Systems Per HPI unless specifically indicated above     Objective:    BP 116/69 (BP Location: Right Arm, Patient Position: Sitting, Cuff  Size: Normal)   Pulse 83   Temp 98.4 F (36.9 C) (Oral)   Ht '5\' 1"'$  (1.549 m)   Wt 160 lb (72.6 kg)   BMI 30.23 kg/m   Wt Readings from Last 3 Encounters:  09/03/18 160 lb (72.6 kg)  08/31/18 161 lb 1.6 oz (73.1 kg)  08/21/18 166 lb 8 oz (75.5 kg)    Physical Exam  Constitutional: She is oriented to person, place, and time. She appears well-developed and well-nourished. No distress.  HENT:  Head: Normocephalic and atraumatic.  Neck: Normal range of motion. Neck supple. Carotid bruit is not present.  Cardiovascular: Normal rate, regular rhythm, S1 normal, S2 normal, normal heart sounds and intact distal pulses.  Pulmonary/Chest: Effort normal and breath sounds normal. No respiratory distress.  Abdominal: Soft. Bowel sounds are normal. She exhibits no distension. There is no hepatosplenomegaly. There is no tenderness. No hernia.  Patient reports LBM 09/01/2018  Musculoskeletal: She exhibits no edema (pedal).  Neurological: She is alert and oriented to person, place, and time.  Skin: Skin is warm and dry. Capillary refill takes less than 2 seconds.     Psychiatric: She has a normal mood and affect. Her behavior is normal.  Vitals reviewed.    Results for orders placed or performed during the hospital encounter of 08/21/18  MRSA PCR Screening  Result Value Ref Range   MRSA by PCR NEGATIVE NEGATIVE  Surgical pcr screen  Result Value Ref Range   MRSA, PCR NEGATIVE NEGATIVE   Staphylococcus aureus POSITIVE (A) NEGATIVE  Glucose, capillary  Result Value Ref Range   Glucose-Capillary 260 (H) 70 - 99 mg/dL  Troponin I - Now Then Q6H  Result Value Ref Range   Troponin I 2.57 (HH) <0.03 ng/mL  Troponin I - Now Then Q6H  Result Value Ref Range   Troponin I 3.93 (HH) <0.03 ng/mL  Basic metabolic panel  Result Value Ref Range   Sodium 138 135 - 145 mmol/L   Potassium 3.7 3.5 - 5.1 mmol/L   Chloride 109 98 - 111 mmol/L   CO2 17 (L) 22 - 32 mmol/L   Glucose, Bld 192 (H) 70 - 99  mg/dL   BUN 14 8 - 23 mg/dL   Creatinine, Ser 0.90 0.44 - 1.00 mg/dL   Calcium 8.5 (L) 8.9 - 10.3 mg/dL   GFR calc non Af Amer >60 >60 mL/min   GFR calc Af Amer >60 >60 mL/min   Anion gap 12 5 - 15  Heparin level (unfractionated)  Result Value Ref Range   Heparin Unfractionated 0.15 (L) 0.30 - 0.70 IU/mL  CBC  Result Value Ref Range   WBC 6.8 4.0 - 10.5 K/uL   RBC 4.09 3.87 - 5.11 MIL/uL   Hemoglobin 12.3 12.0 - 15.0 g/dL   HCT 36.6 36.0 - 46.0 %   MCV 89.5 80.0 - 100.0 fL   MCH 30.1 26.0 - 34.0 pg   MCHC 33.6 30.0 - 36.0 g/dL   RDW 12.9 11.5 - 15.5 %   Platelets 199 150 - 400  K/uL   nRBC 0.0 0.0 - 0.2 %  Heparin level (unfractionated)  Result Value Ref Range   Heparin Unfractionated <0.10 (L) 0.30 - 0.70 IU/mL  Glucose, capillary  Result Value Ref Range   Glucose-Capillary 255 (H) 70 - 99 mg/dL  Heparin level (unfractionated)  Result Value Ref Range   Heparin Unfractionated 0.44 0.30 - 0.70 IU/mL  Glucose, capillary  Result Value Ref Range   Glucose-Capillary 188 (H) 70 - 99 mg/dL  Urinalysis, Routine w reflex microscopic  Result Value Ref Range   Color, Urine YELLOW (A) YELLOW   APPearance CLEAR (A) CLEAR   Specific Gravity, Urine >1.030 (H) 1.005 - 1.030   pH 6.0 5.0 - 8.0   Glucose, UA 250 (A) NEGATIVE mg/dL   Hgb urine dipstick NEGATIVE NEGATIVE   Bilirubin Urine NEGATIVE NEGATIVE   Ketones, ur NEGATIVE NEGATIVE mg/dL   Protein, ur NEGATIVE NEGATIVE mg/dL   Nitrite POSITIVE (A) NEGATIVE   Leukocytes, UA MODERATE (A) NEGATIVE  Glucose, capillary  Result Value Ref Range   Glucose-Capillary 210 (H) 70 - 99 mg/dL  Urinalysis, Microscopic (reflex)  Result Value Ref Range   RBC / HPF 6-10 0 - 5 RBC/hpf   WBC, UA 0-5 0 - 5 WBC/hpf   Bacteria, UA FEW (A) NONE SEEN   Squamous Epithelial / LPF 6-10 0 - 5  Heparin level (unfractionated)  Result Value Ref Range   Heparin Unfractionated 0.57 0.30 - 0.70 IU/mL  CBC  Result Value Ref Range   WBC 6.2 4.0 - 10.5 K/uL     RBC 4.24 3.87 - 5.11 MIL/uL   Hemoglobin 12.4 12.0 - 15.0 g/dL   HCT 38.2 36.0 - 46.0 %   MCV 90.1 80.0 - 100.0 fL   MCH 29.2 26.0 - 34.0 pg   MCHC 32.5 30.0 - 36.0 g/dL   RDW 12.8 11.5 - 15.5 %   Platelets 207 150 - 400 K/uL   nRBC 0.0 0.0 - 0.2 %  Protime-INR  Result Value Ref Range   Prothrombin Time 12.9 11.4 - 15.2 seconds   INR 0.98   TSH  Result Value Ref Range   TSH 2.242 0.350 - 4.500 uIU/mL  Hemoglobin A1c  Result Value Ref Range   Hgb A1c MFr Bld 8.5 (H) 4.8 - 5.6 %   Mean Plasma Glucose 197.25 mg/dL  Comprehensive metabolic panel  Result Value Ref Range   Sodium 140 135 - 145 mmol/L   Potassium 3.7 3.5 - 5.1 mmol/L   Chloride 107 98 - 111 mmol/L   CO2 24 22 - 32 mmol/L   Glucose, Bld 163 (H) 70 - 99 mg/dL   BUN 10 8 - 23 mg/dL   Creatinine, Ser 1.07 (H) 0.44 - 1.00 mg/dL   Calcium 8.6 (L) 8.9 - 10.3 mg/dL   Total Protein 6.7 6.5 - 8.1 g/dL   Albumin 3.5 3.5 - 5.0 g/dL   AST 27 15 - 41 U/L   ALT 18 0 - 44 U/L   Alkaline Phosphatase 71 38 - 126 U/L   Total Bilirubin 0.8 0.3 - 1.2 mg/dL   GFR calc non Af Amer 54 (L) >60 mL/min   GFR calc Af Amer >60 >60 mL/min   Anion gap 9 5 - 15  Glucose, capillary  Result Value Ref Range   Glucose-Capillary 160 (H) 70 - 99 mg/dL  Glucose, capillary  Result Value Ref Range   Glucose-Capillary 204 (H) 70 - 99 mg/dL  Glucose, capillary  Result Value Ref  Range   Glucose-Capillary 178 (H) 70 - 99 mg/dL  Glucose, capillary  Result Value Ref Range   Glucose-Capillary 225 (H) 70 - 99 mg/dL  Heparin level (unfractionated)  Result Value Ref Range   Heparin Unfractionated 0.55 0.30 - 0.70 IU/mL  CBC  Result Value Ref Range   WBC 5.8 4.0 - 10.5 K/uL   RBC 4.19 3.87 - 5.11 MIL/uL   Hemoglobin 12.1 12.0 - 15.0 g/dL   HCT 37.9 36.0 - 46.0 %   MCV 90.5 80.0 - 100.0 fL   MCH 28.9 26.0 - 34.0 pg   MCHC 31.9 30.0 - 36.0 g/dL   RDW 12.9 11.5 - 15.5 %   Platelets 190 150 - 400 K/uL   nRBC 0.0 0.0 - 0.2 %  Glucose, capillary   Result Value Ref Range   Glucose-Capillary 237 (H) 70 - 99 mg/dL  Glucose, capillary  Result Value Ref Range   Glucose-Capillary 169 (H) 70 - 99 mg/dL  Glucose, capillary  Result Value Ref Range   Glucose-Capillary 197 (H) 70 - 99 mg/dL  Glucose, capillary  Result Value Ref Range   Glucose-Capillary 172 (H) 70 - 99 mg/dL  Glucose, capillary  Result Value Ref Range   Glucose-Capillary 216 (H) 70 - 99 mg/dL  Heparin level (unfractionated)  Result Value Ref Range   Heparin Unfractionated 0.48 0.30 - 0.70 IU/mL  CBC  Result Value Ref Range   WBC 5.1 4.0 - 10.5 K/uL   RBC 4.00 3.87 - 5.11 MIL/uL   Hemoglobin 11.9 (L) 12.0 - 15.0 g/dL   HCT 36.2 36.0 - 46.0 %   MCV 90.5 80.0 - 100.0 fL   MCH 29.8 26.0 - 34.0 pg   MCHC 32.9 30.0 - 36.0 g/dL   RDW 12.8 11.5 - 15.5 %   Platelets 171 150 - 400 K/uL   nRBC 0.0 0.0 - 0.2 %  Glucose, capillary  Result Value Ref Range   Glucose-Capillary 185 (H) 70 - 99 mg/dL  Blood gas, arterial  Result Value Ref Range   FIO2 21.00    pH, Arterial 7.424 7.350 - 7.450   pCO2 arterial 38.2 32.0 - 48.0 mmHg   pO2, Arterial 69.1 (L) 83.0 - 108.0 mmHg   Bicarbonate 24.6 20.0 - 28.0 mmol/L   Acid-Base Excess 0.7 0.0 - 2.0 mmol/L   O2 Saturation 93.7 %   Patient temperature 98.6    Collection site RIGHT RADIAL    Drawn by [id number]    Sample type ARTERIAL DRAW    Allens test (pass/fail) PASS PASS  Glucose, capillary  Result Value Ref Range   Glucose-Capillary 189 (H) 70 - 99 mg/dL  Glucose, capillary  Result Value Ref Range   Glucose-Capillary 221 (H) 70 - 99 mg/dL  Glucose, capillary  Result Value Ref Range   Glucose-Capillary 148 (H) 70 - 99 mg/dL  Heparin level (unfractionated)  Result Value Ref Range   Heparin Unfractionated 0.56 0.30 - 0.70 IU/mL  CBC  Result Value Ref Range   WBC 5.5 4.0 - 10.5 K/uL   RBC 4.12 3.87 - 5.11 MIL/uL   Hemoglobin 12.3 12.0 - 15.0 g/dL   HCT 37.2 36.0 - 46.0 %   MCV 90.3 80.0 - 100.0 fL   MCH 29.9 26.0 -  34.0 pg   MCHC 33.1 30.0 - 36.0 g/dL   RDW 12.9 11.5 - 15.5 %   Platelets 199 150 - 400 K/uL   nRBC 0.0 0.0 - 0.2 %  Basic metabolic panel  Result Value Ref Range   Sodium 138 135 - 145 mmol/L   Potassium 3.6 3.5 - 5.1 mmol/L   Chloride 106 98 - 111 mmol/L   CO2 23 22 - 32 mmol/L   Glucose, Bld 185 (H) 70 - 99 mg/dL   BUN 13 8 - 23 mg/dL   Creatinine, Ser 0.91 0.44 - 1.00 mg/dL   Calcium 8.9 8.9 - 10.3 mg/dL   GFR calc non Af Amer >60 >60 mL/min   GFR calc Af Amer >60 >60 mL/min   Anion gap 9 5 - 15  Glucose, capillary  Result Value Ref Range   Glucose-Capillary 157 (H) 70 - 99 mg/dL  Hemoglobin and hematocrit, blood  Result Value Ref Range   Hemoglobin 8.5 (L) 12.0 - 15.0 g/dL   HCT 25.5 (L) 36.0 - 46.0 %  Platelet count  Result Value Ref Range   Platelets 118 (L) 150 - 400 K/uL  CBC  Result Value Ref Range   WBC 6.2 4.0 - 10.5 K/uL   RBC 3.08 (L) 3.87 - 5.11 MIL/uL   Hemoglobin 9.1 (L) 12.0 - 15.0 g/dL   HCT 28.0 (L) 36.0 - 46.0 %   MCV 90.9 80.0 - 100.0 fL   MCH 29.5 26.0 - 34.0 pg   MCHC 32.5 30.0 - 36.0 g/dL   RDW 13.3 11.5 - 15.5 %   Platelets 109 (L) 150 - 400 K/uL   nRBC 0.0 0.0 - 0.2 %  Protime-INR  Result Value Ref Range   Prothrombin Time 16.3 (H) 11.4 - 15.2 seconds   INR 1.32   APTT  Result Value Ref Range   aPTT 29 24 - 36 seconds  CBC  Result Value Ref Range   WBC 8.5 4.0 - 10.5 K/uL   RBC 3.33 (L) 3.87 - 5.11 MIL/uL   Hemoglobin 10.1 (L) 12.0 - 15.0 g/dL   HCT 30.0 (L) 36.0 - 46.0 %   MCV 90.1 80.0 - 100.0 fL   MCH 30.3 26.0 - 34.0 pg   MCHC 33.7 30.0 - 36.0 g/dL   RDW 13.5 11.5 - 15.5 %   Platelets 167 150 - 400 K/uL   nRBC 0.0 0.0 - 0.2 %  Magnesium  Result Value Ref Range   Magnesium 2.8 (H) 1.7 - 2.4 mg/dL  CK  Result Value Ref Range   Total CK 253 (H) 38 - 234 U/L  CBC  Result Value Ref Range   WBC 8.5 4.0 - 10.5 K/uL   RBC 3.34 (L) 3.87 - 5.11 MIL/uL   Hemoglobin 9.8 (L) 12.0 - 15.0 g/dL   HCT 31.2 (L) 36.0 - 46.0 %   MCV  93.4 80.0 - 100.0 fL   MCH 29.3 26.0 - 34.0 pg   MCHC 31.4 30.0 - 36.0 g/dL   RDW 13.6 11.5 - 15.5 %   Platelets 173 150 - 400 K/uL   nRBC 0.0 0.0 - 0.2 %  Basic metabolic panel  Result Value Ref Range   Sodium 138 135 - 145 mmol/L   Potassium 3.9 3.5 - 5.1 mmol/L   Chloride 109 98 - 111 mmol/L   CO2 22 22 - 32 mmol/L   Glucose, Bld 124 (H) 70 - 99 mg/dL   BUN 7 (L) 8 - 23 mg/dL   Creatinine, Ser 0.85 0.44 - 1.00 mg/dL   Calcium 8.1 (L) 8.9 - 10.3 mg/dL   GFR calc non Af Amer >60 >60 mL/min   GFR calc Af Amer >60 >60 mL/min  Anion gap 7 5 - 15  Magnesium  Result Value Ref Range   Magnesium 2.6 (H) 1.7 - 2.4 mg/dL  Glucose, capillary  Result Value Ref Range   Glucose-Capillary 128 (H) 70 - 99 mg/dL   Comment 1 Arterial Specimen   Glucose, capillary  Result Value Ref Range   Glucose-Capillary 141 (H) 70 - 99 mg/dL   Comment 1 Arterial Specimen   Glucose, capillary  Result Value Ref Range   Glucose-Capillary 132 (H) 70 - 99 mg/dL   Comment 1 Arterial Specimen   Glucose, capillary  Result Value Ref Range   Glucose-Capillary 164 (H) 70 - 99 mg/dL   Comment 1 Arterial Specimen   Glucose, capillary  Result Value Ref Range   Glucose-Capillary 145 (H) 70 - 99 mg/dL   Comment 1 Arterial Specimen   Glucose, capillary  Result Value Ref Range   Glucose-Capillary 83 70 - 99 mg/dL   Comment 1 Arterial Specimen   CBC  Result Value Ref Range   WBC 9.5 4.0 - 10.5 K/uL   RBC 3.31 (L) 3.87 - 5.11 MIL/uL   Hemoglobin 10.0 (L) 12.0 - 15.0 g/dL   HCT 30.7 (L) 36.0 - 46.0 %   MCV 92.7 80.0 - 100.0 fL   MCH 30.2 26.0 - 34.0 pg   MCHC 32.6 30.0 - 36.0 g/dL   RDW 14.0 11.5 - 15.5 %   Platelets 152 150 - 400 K/uL   nRBC 0.0 0.0 - 0.2 %  Glucose, capillary  Result Value Ref Range   Glucose-Capillary 146 (H) 70 - 99 mg/dL  Glucose, capillary  Result Value Ref Range   Glucose-Capillary 126 (H) 70 - 99 mg/dL  Glucose, capillary  Result Value Ref Range   Glucose-Capillary 118 (H) 70  - 99 mg/dL   Comment 1 Arterial Specimen   Glucose, capillary  Result Value Ref Range   Glucose-Capillary 118 (H) 70 - 99 mg/dL   Comment 1 Arterial Specimen   Glucose, capillary  Result Value Ref Range   Glucose-Capillary 114 (H) 70 - 99 mg/dL  Glucose, capillary  Result Value Ref Range   Glucose-Capillary 109 (H) 70 - 99 mg/dL  Glucose, capillary  Result Value Ref Range   Glucose-Capillary 99 70 - 99 mg/dL  Glucose, capillary  Result Value Ref Range   Glucose-Capillary 115 (H) 70 - 99 mg/dL   Comment 1 Arterial Specimen   .Cooxemetry Panel (carboxy, met, total hgb, O2 sat)  Result Value Ref Range   Total hemoglobin 10.4 (L) 12.0 - 16.0 g/dL   O2 Saturation 54.5 %   Carboxyhemoglobin 1.4 0.5 - 1.5 %   Methemoglobin 1.9 (H) 0.0 - 1.5 %  Glucose, capillary  Result Value Ref Range   Glucose-Capillary 117 (H) 70 - 99 mg/dL   Comment 1 Arterial Specimen   Glucose, capillary  Result Value Ref Range   Glucose-Capillary 105 (H) 70 - 99 mg/dL   Comment 1 Arterial Specimen   Glucose, capillary  Result Value Ref Range   Glucose-Capillary 110 (H) 70 - 99 mg/dL   Comment 1 Capillary Specimen   Glucose, capillary  Result Value Ref Range   Glucose-Capillary 129 (H) 70 - 99 mg/dL   Comment 1 Arterial Specimen   Glucose, capillary  Result Value Ref Range   Glucose-Capillary 118 (H) 70 - 99 mg/dL   Comment 1 Notify RN   Glucose, capillary  Result Value Ref Range   Glucose-Capillary 105 (H) 70 - 99 mg/dL   Comment 1  Capillary Specimen   Glucose, capillary  Result Value Ref Range   Glucose-Capillary 111 (H) 70 - 99 mg/dL   Comment 1 Capillary Specimen   Glucose, capillary  Result Value Ref Range   Glucose-Capillary 118 (H) 70 - 99 mg/dL   Comment 1 Capillary Specimen   Basic metabolic panel  Result Value Ref Range   Sodium 136 135 - 145 mmol/L   Potassium 3.8 3.5 - 5.1 mmol/L   Chloride 105 98 - 111 mmol/L   CO2 23 22 - 32 mmol/L   Glucose, Bld 154 (H) 70 - 99 mg/dL   BUN  9 8 - 23 mg/dL   Creatinine, Ser 0.98 0.44 - 1.00 mg/dL   Calcium 8.0 (L) 8.9 - 10.3 mg/dL   GFR calc non Af Amer >60 >60 mL/min   GFR calc Af Amer >60 >60 mL/min   Anion gap 8 5 - 15  CBC  Result Value Ref Range   WBC 10.3 4.0 - 10.5 K/uL   RBC 3.08 (L) 3.87 - 5.11 MIL/uL   Hemoglobin 9.3 (L) 12.0 - 15.0 g/dL   HCT 28.4 (L) 36.0 - 46.0 %   MCV 92.2 80.0 - 100.0 fL   MCH 30.2 26.0 - 34.0 pg   MCHC 32.7 30.0 - 36.0 g/dL   RDW 13.8 11.5 - 15.5 %   Platelets 153 150 - 400 K/uL   nRBC 0.0 0.0 - 0.2 %  Creatinine, serum  Result Value Ref Range   Creatinine, Ser 1.02 (H) 0.44 - 1.00 mg/dL   GFR calc non Af Amer 57 (L) >60 mL/min   GFR calc Af Amer >60 >60 mL/min  Magnesium  Result Value Ref Range   Magnesium 2.4 1.7 - 2.4 mg/dL  Glucose, capillary  Result Value Ref Range   Glucose-Capillary 166 (H) 70 - 99 mg/dL   Comment 1 Capillary Specimen   Glucose, capillary  Result Value Ref Range   Glucose-Capillary 111 (H) 70 - 99 mg/dL   Comment 1 Capillary Specimen   Glucose, capillary  Result Value Ref Range   Glucose-Capillary 140 (H) 70 - 99 mg/dL   Comment 1 Capillary Specimen   Glucose, capillary  Result Value Ref Range   Glucose-Capillary 149 (H) 70 - 99 mg/dL   Comment 1 Capillary Specimen   Cooxemetry Panel (carboxy, met, total hgb, O2 sat)  Result Value Ref Range   Total hemoglobin 9.9 (L) 12.0 - 16.0 g/dL   O2 Saturation 62.2 %   Carboxyhemoglobin 1.7 (H) 0.5 - 1.5 %   Methemoglobin 2.1 (H) 0.0 - 1.5 %  Glucose, capillary  Result Value Ref Range   Glucose-Capillary 131 (H) 70 - 99 mg/dL   Comment 1 Capillary Specimen    Comment 2 Notify RN   Glucose, capillary  Result Value Ref Range   Glucose-Capillary 201 (H) 70 - 99 mg/dL   Comment 1 Capillary Specimen    Comment 2 Notify RN   Basic metabolic panel  Result Value Ref Range   Sodium 136 135 - 145 mmol/L   Potassium 3.8 3.5 - 5.1 mmol/L   Chloride 103 98 - 111 mmol/L   CO2 27 22 - 32 mmol/L   Glucose, Bld  114 (H) 70 - 99 mg/dL   BUN 13 8 - 23 mg/dL   Creatinine, Ser 0.82 0.44 - 1.00 mg/dL   Calcium 7.9 (L) 8.9 - 10.3 mg/dL   GFR calc non Af Amer >60 >60 mL/min   GFR calc Af Amer >60 >  60 mL/min   Anion gap 6 5 - 15  CBC  Result Value Ref Range   WBC 8.7 4.0 - 10.5 K/uL   RBC 2.92 (L) 3.87 - 5.11 MIL/uL   Hemoglobin 8.8 (L) 12.0 - 15.0 g/dL   HCT 27.0 (L) 36.0 - 46.0 %   MCV 92.5 80.0 - 100.0 fL   MCH 30.1 26.0 - 34.0 pg   MCHC 32.6 30.0 - 36.0 g/dL   RDW 13.8 11.5 - 15.5 %   Platelets 163 150 - 400 K/uL   nRBC 0.0 0.0 - 0.2 %  .Cooxemetry Panel (carboxy, met, total hgb, O2 sat)  Result Value Ref Range   Total hemoglobin 9.2 (L) 12.0 - 16.0 g/dL   O2 Saturation 62.5 %   Carboxyhemoglobin 1.5 0.5 - 1.5 %   Methemoglobin 1.9 (H) 0.0 - 1.5 %  Glucose, capillary  Result Value Ref Range   Glucose-Capillary 176 (H) 70 - 99 mg/dL   Comment 1 Capillary Specimen    Comment 2 Notify RN   Glucose, capillary  Result Value Ref Range   Glucose-Capillary 199 (H) 70 - 99 mg/dL   Comment 1 Capillary Specimen   Glucose, capillary  Result Value Ref Range   Glucose-Capillary 153 (H) 70 - 99 mg/dL  Glucose, capillary  Result Value Ref Range   Glucose-Capillary 125 (H) 70 - 99 mg/dL  Glucose, capillary  Result Value Ref Range   Glucose-Capillary 125 (H) 70 - 99 mg/dL   Comment 1 1 Capillary Specimen    Comment 2 4 Notify RN   Glucose, capillary  Result Value Ref Range   Glucose-Capillary 59 (L) 70 - 99 mg/dL   Comment 1 Notify RN    Comment 2 Call MD NNP PA CNM   Glucose, capillary  Result Value Ref Range   Glucose-Capillary 68 (L) 70 - 99 mg/dL  Glucose, capillary  Result Value Ref Range   Glucose-Capillary 69 (L) 70 - 99 mg/dL  Glucose, capillary  Result Value Ref Range   Glucose-Capillary 85 70 - 99 mg/dL  CBC  Result Value Ref Range   WBC 8.3 4.0 - 10.5 K/uL   RBC 3.05 (L) 3.87 - 5.11 MIL/uL   Hemoglobin 9.2 (L) 12.0 - 15.0 g/dL   HCT 27.8 (L) 36.0 - 46.0 %   MCV 91.1 80.0  - 100.0 fL   MCH 30.2 26.0 - 34.0 pg   MCHC 33.1 30.0 - 36.0 g/dL   RDW 13.5 11.5 - 15.5 %   Platelets 227 150 - 400 K/uL   nRBC 0.0 0.0 - 0.2 %  Basic metabolic panel  Result Value Ref Range   Sodium 137 135 - 145 mmol/L   Potassium 3.6 3.5 - 5.1 mmol/L   Chloride 103 98 - 111 mmol/L   CO2 26 22 - 32 mmol/L   Glucose, Bld 100 (H) 70 - 99 mg/dL   BUN 14 8 - 23 mg/dL   Creatinine, Ser 0.86 0.44 - 1.00 mg/dL   Calcium 8.4 (L) 8.9 - 10.3 mg/dL   GFR calc non Af Amer >60 >60 mL/min   GFR calc Af Amer >60 >60 mL/min   Anion gap 8 5 - 15  Glucose, capillary  Result Value Ref Range   Glucose-Capillary 88 70 - 99 mg/dL  Glucose, capillary  Result Value Ref Range   Glucose-Capillary 111 (H) 70 - 99 mg/dL  Glucose, capillary  Result Value Ref Range   Glucose-Capillary 169 (H) 70 - 99 mg/dL  Glucose,  capillary  Result Value Ref Range   Glucose-Capillary 109 (H) 70 - 99 mg/dL  CBC  Result Value Ref Range   WBC 5.3 4.0 - 10.5 K/uL   RBC 3.25 (L) 3.87 - 5.11 MIL/uL   Hemoglobin 9.5 (L) 12.0 - 15.0 g/dL   HCT 29.7 (L) 36.0 - 46.0 %   MCV 91.4 80.0 - 100.0 fL   MCH 29.2 26.0 - 34.0 pg   MCHC 32.0 30.0 - 36.0 g/dL   RDW 13.2 11.5 - 15.5 %   Platelets 242 150 - 400 K/uL   nRBC 0.0 0.0 - 0.2 %  Basic metabolic panel  Result Value Ref Range   Sodium 135 135 - 145 mmol/L   Potassium 3.5 3.5 - 5.1 mmol/L   Chloride 97 (L) 98 - 111 mmol/L   CO2 28 22 - 32 mmol/L   Glucose, Bld 106 (H) 70 - 99 mg/dL   BUN 17 8 - 23 mg/dL   Creatinine, Ser 1.03 (H) 0.44 - 1.00 mg/dL   Calcium 8.5 (L) 8.9 - 10.3 mg/dL   GFR calc non Af Amer 57 (L) >60 mL/min   GFR calc Af Amer >60 >60 mL/min   Anion gap 10 5 - 15  Glucose, capillary  Result Value Ref Range   Glucose-Capillary 97 70 - 99 mg/dL  Glucose, capillary  Result Value Ref Range   Glucose-Capillary 120 (H) 70 - 99 mg/dL   Comment 1 Notify RN   Glucose, capillary  Result Value Ref Range   Glucose-Capillary 190 (H) 70 - 99 mg/dL  I-STAT,  chem 8  Result Value Ref Range   Sodium 140 135 - 145 mmol/L   Potassium 3.9 3.5 - 5.1 mmol/L   Chloride 106 98 - 111 mmol/L   BUN 11 8 - 23 mg/dL   Creatinine, Ser 0.60 0.44 - 1.00 mg/dL   Glucose, Bld 195 (H) 70 - 99 mg/dL   Calcium, Ion 1.20 1.15 - 1.40 mmol/L   TCO2 26 22 - 32 mmol/L   Hemoglobin 11.2 (L) 12.0 - 15.0 g/dL   HCT 33.0 (L) 36.0 - 46.0 %  I-STAT, chem 8  Result Value Ref Range   Sodium 141 135 - 145 mmol/L   Potassium 3.4 (L) 3.5 - 5.1 mmol/L   Chloride 107 98 - 111 mmol/L   BUN 10 8 - 23 mg/dL   Creatinine, Ser 0.60 0.44 - 1.00 mg/dL   Glucose, Bld 164 (H) 70 - 99 mg/dL   Calcium, Ion 1.20 1.15 - 1.40 mmol/L   TCO2 25 22 - 32 mmol/L   Hemoglobin 10.2 (L) 12.0 - 15.0 g/dL   HCT 30.0 (L) 36.0 - 46.0 %  I-STAT 3, arterial blood gas (G3+)  Result Value Ref Range   pH, Arterial 7.476 (H) 7.350 - 7.450   pCO2 arterial 36.6 32.0 - 48.0 mmHg   pO2, Arterial 408.0 (H) 83.0 - 108.0 mmHg   Bicarbonate 27.0 20.0 - 28.0 mmol/L   TCO2 28 22 - 32 mmol/L   O2 Saturation 100.0 %   Acid-Base Excess 3.0 (H) 0.0 - 2.0 mmol/L   Patient temperature HIDE    Sample type ARTERIAL   I-STAT, chem 8  Result Value Ref Range   Sodium 143 135 - 145 mmol/L   Potassium 3.9 3.5 - 5.1 mmol/L   Chloride 102 98 - 111 mmol/L   BUN 8 8 - 23 mg/dL   Creatinine, Ser 0.40 (L) 0.44 - 1.00 mg/dL   Glucose, Bld  132 (H) 70 - 99 mg/dL   Calcium, Ion 0.94 (L) 1.15 - 1.40 mmol/L   TCO2 28 22 - 32 mmol/L   Hemoglobin 8.8 (L) 12.0 - 15.0 g/dL   HCT 26.0 (L) 36.0 - 46.0 %  I-STAT, chem 8  Result Value Ref Range   Sodium 138 135 - 145 mmol/L   Potassium 4.6 3.5 - 5.1 mmol/L   Chloride 102 98 - 111 mmol/L   BUN 8 8 - 23 mg/dL   Creatinine, Ser 0.60 0.44 - 1.00 mg/dL   Glucose, Bld 135 (H) 70 - 99 mg/dL   Calcium, Ion 0.99 (L) 1.15 - 1.40 mmol/L   TCO2 27 22 - 32 mmol/L   Hemoglobin 7.5 (L) 12.0 - 15.0 g/dL   HCT 22.0 (L) 36.0 - 46.0 %  I-STAT, chem 8  Result Value Ref Range   Sodium 139 135  - 145 mmol/L   Potassium 4.6 3.5 - 5.1 mmol/L   Chloride 102 98 - 111 mmol/L   BUN 9 8 - 23 mg/dL   Creatinine, Ser 0.50 0.44 - 1.00 mg/dL   Glucose, Bld 158 (H) 70 - 99 mg/dL   Calcium, Ion 1.00 (L) 1.15 - 1.40 mmol/L   TCO2 27 22 - 32 mmol/L   Hemoglobin 8.8 (L) 12.0 - 15.0 g/dL   HCT 26.0 (L) 36.0 - 46.0 %  I-STAT 3, arterial blood gas (G3+)  Result Value Ref Range   pH, Arterial 7.391 7.350 - 7.450   pCO2 arterial 40.1 32.0 - 48.0 mmHg   pO2, Arterial 356.0 (H) 83.0 - 108.0 mmHg   Bicarbonate 24.3 20.0 - 28.0 mmol/L   TCO2 26 22 - 32 mmol/L   O2 Saturation 100.0 %   Acid-base deficit 1.0 0.0 - 2.0 mmol/L   Patient temperature HIDE    Sample type ARTERIAL   I-STAT, chem 8  Result Value Ref Range   Sodium 141 135 - 145 mmol/L   Potassium 3.6 3.5 - 5.1 mmol/L   Chloride 104 98 - 111 mmol/L   BUN 8 8 - 23 mg/dL   Creatinine, Ser 0.50 0.44 - 1.00 mg/dL   Glucose, Bld 171 (H) 70 - 99 mg/dL   Calcium, Ion 1.07 (L) 1.15 - 1.40 mmol/L   TCO2 25 22 - 32 mmol/L   Hemoglobin 9.5 (L) 12.0 - 15.0 g/dL   HCT 28.0 (L) 36.0 - 46.0 %  I-STAT 4, (NA,K, GLUC, HGB,HCT)  Result Value Ref Range   Sodium 143 135 - 145 mmol/L   Potassium 3.6 3.5 - 5.1 mmol/L   Glucose, Bld 128 (H) 70 - 99 mg/dL   HCT 24.0 (L) 36.0 - 46.0 %   Hemoglobin 8.2 (L) 12.0 - 15.0 g/dL  I-STAT 3, arterial blood gas (G3+)  Result Value Ref Range   pH, Arterial 7.426 7.350 - 7.450   pCO2 arterial 33.5 32.0 - 48.0 mmHg   pO2, Arterial 112.0 (H) 83.0 - 108.0 mmHg   Bicarbonate 22.3 20.0 - 28.0 mmol/L   TCO2 23 22 - 32 mmol/L   O2 Saturation 99.0 %   Acid-base deficit 2.0 0.0 - 2.0 mmol/L   Patient temperature 35.7 C    Collection site ARTERIAL LINE    Drawn by Operator    Sample type ARTERIAL   I-STAT 3, arterial blood gas (G3+)  Result Value Ref Range   pH, Arterial 7.374 7.350 - 7.450   pCO2 arterial 36.3 32.0 - 48.0 mmHg   pO2, Arterial 105.0 83.0 -  108.0 mmHg   Bicarbonate 21.2 20.0 - 28.0 mmol/L   TCO2  22 22 - 32 mmol/L   O2 Saturation 98.0 %   Acid-base deficit 4.0 (H) 0.0 - 2.0 mmol/L   Patient temperature 37.0 C    Collection site ARTERIAL LINE    Drawn by Operator    Sample type ARTERIAL   I-STAT 3, arterial blood gas (G3+)  Result Value Ref Range   pH, Arterial 7.335 (L) 7.350 - 7.450   pCO2 arterial 42.7 32.0 - 48.0 mmHg   pO2, Arterial 99.0 83.0 - 108.0 mmHg   Bicarbonate 22.7 20.0 - 28.0 mmol/L   TCO2 24 22 - 32 mmol/L   O2 Saturation 97.0 %   Acid-base deficit 3.0 (H) 0.0 - 2.0 mmol/L   Patient temperature 37.3 C    Collection site ARTERIAL LINE    Drawn by Operator    Sample type ARTERIAL   I-STAT, chem 8  Result Value Ref Range   Sodium 140 135 - 145 mmol/L   Potassium 4.1 3.5 - 5.1 mmol/L   Chloride 106 98 - 111 mmol/L   BUN 7 (L) 8 - 23 mg/dL   Creatinine, Ser 0.70 0.44 - 1.00 mg/dL   Glucose, Bld 144 (H) 70 - 99 mg/dL   Calcium, Ion 1.15 1.15 - 1.40 mmol/L   TCO2 24 22 - 32 mmol/L   Hemoglobin 9.9 (L) 12.0 - 15.0 g/dL   HCT 29.0 (L) 36.0 - 46.0 %  I-STAT, chem 8  Result Value Ref Range   Sodium 139 135 - 145 mmol/L   Potassium 4.2 3.5 - 5.1 mmol/L   Chloride 106 98 - 111 mmol/L   BUN 8 8 - 23 mg/dL   Creatinine, Ser 0.80 0.44 - 1.00 mg/dL   Glucose, Bld 133 (H) 70 - 99 mg/dL   Calcium, Ion 1.10 (L) 1.15 - 1.40 mmol/L   TCO2 20 (L) 22 - 32 mmol/L   Hemoglobin 10.2 (L) 12.0 - 15.0 g/dL   HCT 30.0 (L) 36.0 - 46.0 %  ECHOCARDIOGRAM COMPLETE  Result Value Ref Range   Weight 2,604.8 oz   Height 61 in   BP 105/59 mmHg  Type and screen [place]  Result Value Ref Range   ABO/RH(D) O POS    Antibody Screen NEG    Sample Expiration 08/28/2018    Unit Number [id number]    Blood Component Type RED CELLS,LR    Unit division 00    Status of Unit REL FROM [facility]    Transfusion Status OK TO TRANSFUSE    Crossmatch Result      Compatible Performed at [facility], [address] [place], [place] [address]    Unit Number  [id number]    Blood Component Type RED CELLS,LR    Unit division 00    Status of Unit ISSUED,FINAL    Transfusion Status OK TO TRANSFUSE    Crossmatch Result Compatible   Prepare RBC (crossmatch)  Result Value Ref Range   Order Confirmation      ORDER PROCESSED BY BLOOD BANK Performed at [facility], [place] [address] [place], [place] [address]   ABO/Rh  Result Value Ref Range   ABO/RH(D)      O POS Performed at [facility] [address], [place], [place] [address]   Prepare RBC (crossmatch)  Result Value Ref Range   Order Confirmation      ORDER PROCESSED BY BLOOD BANK BB  SAMPLE OR UNITS ALREADY AVAILABLE Performed at [facility], [place] [address] [place], [place] [address]   Prepare fresh frozen plasma  Result Value Ref Range   Unit Number [id number]    Blood Component Type THW PLS APHR    Unit division A0    Status of Unit ISSUED,FINAL    Transfusion Status      OK TO TRANSFUSE Performed at [facility] [address] [place], [place] [address]    Unit Number [id number]    Blood Component Type THAWED PLASMA    Unit division 00    Status of Unit REL FROM [facility]    Transfusion Status OK TO TRANSFUSE   Pulmonary function test  Result Value Ref Range   FVC-Pre 1.96 L   FVC-%Pred-Pre 68 %   FEV1-Pre 1.60 L   FEV1-%Pred-Pre 72 %   FEV6-Pre 1.96 L   FEV6-%Pred-Pre 71 %   Pre FEV1/FVC ratio 82 %   FEV1FVC-%Pred-Pre 105 %   Pre FEV6/FVC Ratio 100 %   FEV6FVC-%Pred-Pre 104 %   FEF 25-75 Pre 1.56 L/sec   FEF2575-%Pred-Pre 76 %  BPAM RBC  Result Value Ref Range   ISSUE DATE / TIME 062694854627    Blood Product Unit Number [id number]    PRODUCT CODE [id number]    Unit Type and Rh 5100    Blood Product Expiration Date 371696789381    ISSUE DATE / TIME 017510258527    Blood Product Unit Number [id number]    PRODUCT CODE [id number]    Unit Type and Rh 5100    Blood Product Expiration Date 201912152359   BPAM FFP  Result Value  Ref Range   ISSUE DATE / TIME [id number]    Blood Product Unit Number [id number]    PRODUCT CODE [id number]    Unit Type and Rh 5100    Blood Product Expiration Date 201911202359    ISSUE DATE / TIME [id number]    Blood Product Unit Number [id number]    PRODUCT CODE [id number]    Unit Type and Rh 9500    Blood Product Expiration Date 201911202359       Assessment & Plan:   Problem List Items Addressed This Visit    None    Visit Diagnoses    Uncontrolled type 2 diabetes mellitus with hyperglycemia, without long-term current use of insulin (HCC)       Relevant Medications   metFORMIN (GLUCOPHAGE) 1000 MG tablet   lisinopril (PRINIVIL,ZESTRIL) 2.5 MG tablet   Other Relevant Orders   BASIC METABOLIC PANEL WITH GFR    UncontrolledDM with A1c 8.4% improved from 1 year ago with A1c > 10% and goal A1c < 7.0%. - Complications - CAD, hyperlipidemia and hyperglycemia.  Plan:  1. INCREASE metformin to 1,000 mg po bid with meals - START taking Januvia 50 mg daily. - START taking glimepiride 2 mg once daily.  May change this to SGLT2 med in future if patient experiences any hypoglycemia. 2. Encourage improved lifestyle: - low carb/low glycemic diet handout provided - Increase physical activity to 30 minutes most days of the week.  Explained that increased physical activity increases body's use of sugar for energy. 3. Check fasting am CBG and bring log to next visit for review 4. Continue ASA and Statin  - START lisinopril 2.5 mg once daily for renal protection. 5. Advised to schedule DM ophtho exam, send record. 6. Follow-up 6 weeks with glucose log and food log. - Consider future DM education classes.  Meds ordered this encounter  Medications  . metFORMIN (GLUCOPHAGE) 1000 MG tablet    Sig: Take 1 tablet (1,000 mg total) by mouth 2 (two) times daily with a meal.    Dispense:  180 tablet    Refill:  1    Order Specific Question:   Supervising Provider    Answer:    Olin Hauser [[id number]]  . lisinopril (PRINIVIL,ZESTRIL) 2.5 MG tablet    Sig: Take 1 tablet (2.5 mg total) by mouth daily.    Dispense:  90 tablet    Refill:  1    Order Specific Question:   Supervising Provider    Answer:   Olin Hauser [[id number]]    Follow up plan: Return in about 6 weeks (around 10/15/2018) for diabetes with sugar and food log.  A total of 45 minutes was spent face-to-face with this patient. Greater than 50% of this time was spent in counseling and coordination of care with the patient, specifically disease education, medications, and self care requirements for T2DM as above.    Cassell Smiles, DNP, AGPCNP-BC Adult Gerontology Primary Care Nurse Practitioner [facility] 09/03/2018, 10:15 AM

## 2018-09-12 ENCOUNTER — Ambulatory Visit (INDEPENDENT_AMBULATORY_CARE_PROVIDER_SITE_OTHER): Payer: Managed Care, Other (non HMO) | Admitting: Nurse Practitioner

## 2018-09-12 ENCOUNTER — Encounter: Payer: Self-pay | Admitting: Nurse Practitioner

## 2018-09-12 VITALS — BP 112/60 | HR 80 | Ht 61.0 in | Wt 160.5 lb

## 2018-09-12 DIAGNOSIS — I2510 Atherosclerotic heart disease of native coronary artery without angina pectoris: Secondary | ICD-10-CM | POA: Diagnosis not present

## 2018-09-12 DIAGNOSIS — E785 Hyperlipidemia, unspecified: Secondary | ICD-10-CM | POA: Diagnosis not present

## 2018-09-12 DIAGNOSIS — I25119 Atherosclerotic heart disease of native coronary artery with unspecified angina pectoris: Secondary | ICD-10-CM

## 2018-09-12 DIAGNOSIS — E1159 Type 2 diabetes mellitus with other circulatory complications: Secondary | ICD-10-CM | POA: Diagnosis not present

## 2018-09-12 DIAGNOSIS — I214 Non-ST elevation (NSTEMI) myocardial infarction: Secondary | ICD-10-CM | POA: Diagnosis not present

## 2018-09-12 DIAGNOSIS — D649 Anemia, unspecified: Secondary | ICD-10-CM

## 2018-09-12 NOTE — Patient Instructions (Signed)
Medication Instructions:  Your physician recommends that you continue on your current medications as directed. Please refer to the Current Medication list given to you today.  If you need a refill on your cardiac medications before your next appointment, please call your pharmacy.   Lab work: 1- Today : CBC, BMET 2- Your physician recommends that you return for lab work on/near 12/27 (Fasting lipids, LFTs)  If you have labs (blood work) drawn today and your tests are completely normal, you will receive your results only by: Marland Kitchen. MyChart Message (if you have MyChart) OR . A paper copy in the mail If you have any lab test that is abnormal or we need to change your treatment, we will call you to review the results.  Testing/Procedures: None ordered   Follow-Up: At [facility], you and your health needs are our priority.  As part of our continuing mission to provide you with exceptional heart care, we have created designated Provider Care Teams.  These Care Teams include your primary Cardiologist (physician) and Advanced Practice Providers (APPs -  Physician Assistants and Nurse Practitioners) who all work together to provide you with the care you need, when you need it. You will need a follow up appointment in 3 weeks.  Please call our office 2 months in advance to schedule this appointment.  You may see Yvonne Kendallhristopher End, MD or one of the following Advanced Practice Providers on your designated Care Team:   Nicolasa Duckinghristopher Berge, NP Eula Listenyan Dunn, PA-C . Marisue IvanJacquelyn Visser, PA-C

## 2018-09-12 NOTE — Progress Notes (Signed)
Office Visit    Patient Name: Olivia Roth Date of Encounter: 09/12/2018  Primary Care Provider:  Mikey College, NP Primary Cardiologist:  Nelva Bush, MD  Chief Complaint    62 year old female with a history of hyperlipidemia, diabetes, anxiety, and GERD, who was recently admitted with non-STEMI and subsequent required CABG x4.  Past Medical History    Past Medical History:  Diagnosis Date  . Anxiety   . Asthma    In cold weather (08/21/2018)  . CAD (coronary artery disease)    a. 08/2018 NSTEMI/Cath: LM 50d,LAD 80ost/p, 33m70d, LCX 90ost, OM1 50, OM3 70, OM4 80, LPDA small, RCA  99p/d, EF 30-35%; c. 08/2018 CABG x 4: LIMA->LAD, VG->RI, VG->OM, VG->RCA.  .Marland KitchenChronic combined systolic (congestive) and diastolic (congestive) heart failure ([place]    a. 08/2018 Echo: EF 40-45%, mid-apicalanteroseptal and apical HK. Gr2 DD. Mild AI; b. 08/2018 intraop TEE: EF 45-50%, antsept, infsept HK. Mild AI. Mildly dil Ao root. Trace MR.  . Family history of adverse reaction to anesthesia    "mother PONV; talked crazy/loud after anesthesia" (08/21/2018)  . GERD (gastroesophageal reflux disease)   . High cholesterol   . Ischemic cardiomyopathy    a. 08/2018 Echo: EF 40-45%; b. 08/2018 TEE: EF 45-50%.  . Seasonal allergies   . Tuberculosis ~ 1960  . Type II diabetes mellitus ([place]   . UTI (lower urinary tract infection)    "once; before hysterectomy" (08/21/2018)   Past Surgical History:  Procedure Laterality Date  . CORONARY ARTERY BYPASS GRAFT N/A 08/26/2018   Procedure: CORONARY ARTERY BYPASS GRAFTING (CABG) times four using left internal mammary artery and right leg saphenous vein grafts;  Surgeon: VIvin Poot MD;  [facility] [facility]  Service: Open Heart Surgery;  Laterality: N/A;  . GANGLION CYST EXCISION Left ~ 1960   neck  . LEFT HEART CATH AND CORONARY ANGIOGRAPHY N/A 08/21/2018   Procedure: LEFT HEART CATH AND CORONARY ANGIOGRAPHY;  Surgeon: ENelva Bush MD;   [facility];  Service: Cardiovascular;  Laterality: N/A;  . TEE WITHOUT CARDIOVERSION N/A 08/26/2018   Procedure: TRANSESOPHAGEAL ECHOCARDIOGRAM (TEE);  Surgeon: VPrescott Gum PCollier Salina MD;  [facility]  Service: Open Heart Surgery;  Laterality: N/A;  . TOTAL ABDOMINAL HYSTERECTOMY  2006    Allergies  Allergies  Allergen Reactions  . Shellfish Allergy Hives  . Avocado     UNSPECIFIED REACTION   . Advair Diskus [Fluticasone-Salmeterol] Palpitations  . Cortisone Other (See Comments)    drowsiness  . Pecan Nut (Diagnostic) Itching  . Salmon [Fish Allergy] Rash    Rash and itching of ears    History of Present Illness    62year old female with the above past medical history including GERD, hyperlipidemia, seasonal allergies, diabetes, and anxiety.  [facility] and burning radiating into her throat associated with dizziness and nausea.  In the ED, she was tachycardic and initial troponin was elevated at 0.14.  She subsequently peaked at 3.93.  Echocardiogram showed reduced LV function with an EF of 40 to 45%.  Diagnostic catheterization showed severe multivessel coronary artery disease with an EF of 30 to 35%.  [facility] with details above in the past medical history.  Postoperative course was relatively uncomplicated and she did not require blood transfusion.  She was subsequently discharged on November 23.  Since discharge, she has done quite well.  She is ambulating around her house without any angina or dyspnea.  She is no longer having chest wall pain and denies lower extremity edema.  She has been tolerating medications well and all surgical incisions have been healing appropriately.  She denies palpitations, PND, orthopnea, dizziness, syncope, or early satiety.  She is interested in participating in cardiac rehabilitation but notes that [facility] will become out of  network for her [place] insurance beginning in January.  Home Medications    Prior to Admission medications   Medication Sig Start Date End Date Taking? Authorizing Provider  acetaminophen (TYLENOL) 500 MG tablet Take 2 tablets (1,000 mg total) by mouth every 6 (six) hours as needed. 08/31/18   Barrett, Erin R, PA-C  albuterol (PROVENTIL HFA;VENTOLIN HFA) 108 (90 Base) MCG/ACT inhaler Inhale 2 puffs into the lungs every 6 (six) hours as needed for wheezing or shortness of breath. Patient not taking: Reported on 09/03/2018 09/10/17   Mikey College, NP  aspirin EC 325 MG EC tablet Take 1 tablet (325 mg total) by mouth daily. 08/31/18   Barrett, Erin R, PA-C  atorvastatin (LIPITOR) 80 MG tablet Take 1 tablet (80 mg total) by mouth daily at 6 PM. 08/31/18   Barrett, Erin R, PA-C  blood glucose meter kit and supplies KIT Dispense based on patient and insurance preference. Use up to four times daily as directed. For E11.65 Check blood glucose 3 times weekly on MWF Patient not taking: Reported on 09/03/2018 07/20/16   Luciana Axe, NP  Cinnamon 500 MG TABS Take 500 mg by mouth daily.     [provider]  furosemide (LASIX) 40 MG tablet Take 1 tablet (40 mg total) by mouth daily. For 7 days 08/31/18   Barrett, Lodema Hong, PA-C  glimepiride (AMARYL) 2 MG tablet Take 1 tablet (2 mg total) by mouth daily before breakfast. Patient not taking: Reported on 09/03/2018 09/02/18   Mikey College, NP  guaiFENesin (MUCINEX) 600 MG 12 hr tablet Take 1 tablet (600 mg total) by mouth 2 (two) times daily as needed. Patient not taking: Reported on 09/03/2018 08/31/18   Barrett, Erin R, PA-C  ipratropium (ATROVENT) 0.06 % nasal spray Place 2 sprays into both nostrils 4 (four) times daily. Patient not taking: Reported on 10/05/2017 09/10/17   Mikey College, NP  lisinopril (PRINIVIL,ZESTRIL) 2.5 MG tablet Take 1 tablet (2.5 mg total) by mouth daily. 09/03/18   Mikey College, NP    metFORMIN (GLUCOPHAGE) 1000 MG tablet Take 1 tablet (1,000 mg total) by mouth 2 (two) times daily with a meal. 09/03/18   Mikey College, NP  metoprolol tartrate (LOPRESSOR) 25 MG tablet Take 1 tablet (25 mg total) by mouth 2 (two) times daily. 08/31/18   Barrett, Erin R, PA-C  potassium chloride SA (K-DUR,KLOR-CON) 20 MEQ tablet Take 1 tablet (20 mEq total) by mouth daily. 08/31/18   Barrett, Erin R, PA-C  sitaGLIPtin (JANUVIA) 50 MG tablet Take 1 tablet (50 mg total) by mouth daily. Patient not taking: Reported on 09/03/2018 09/02/18   Mikey College, NP  traMADol (ULTRAM) 50 MG tablet Take 1-2 tablets (50-100 mg total) by mouth every 4 (four) hours as needed for moderate pain. Patient not taking: Reported on 09/03/2018 08/31/18   Barrett, Lodema Hong, PA-C    Review of Systems    Doing well postoperatively.  She has not had any angina or dyspnea and further denies any palpitations, PND, orthopnea, dizziness, syncope, edema, or early satiety.  All  surgical incisions have been healing well.  All other systems reviewed and are otherwise negative except as noted above.  Physical Exam    VS:  BP 112/60 (BP Location: Left Arm, Patient Position: Sitting, Cuff Size: Normal)   Pulse 80   Ht _0  (1.549 m)   Wt 160 lb 8 oz (72.8 kg)   BMI 30.33 kg/m  , BMI Body mass index is 30.33 kg/m. GEN: Well nourished, well developed, in no acute distress. HEENT: normal. Neck: Supple, no JVD, carotid bruits, or masses. Cardiac: RRR, no murmurs, rubs, or gallops. No clubbing, cyanosis, edema.  Radials/DP/PT 2+ and equal bilaterally.  Midline sternal incision healing well without erythema, drainage.  Right lower extremity incisions healing well without erythema or drainage. Respiratory:  Respirations regular and unlabored, diminished breath sounds in the left base otherwise clear to auscultation. GI: Soft, nontender, nondistended, BS + x 4.  Chest tube sites are healing well without erythema or  drainage. MS: no deformity or atrophy. Skin: warm and dry, no rash. Neuro:  Strength and sensation are intact. Psych: Normal affect.  Accessory Clinical Findings    ECG personally reviewed by me today -regular sinus rhythm, 81, left axis deviation, prior inferior infarct with diffuse T wave inversion.- no acute changes.  Lab Results  Component Value Date   WBC 5.3 08/31/2018   HGB 9.5 (L) 08/31/2018   HCT 29.7 (L) 08/31/2018   MCV 91.4 08/31/2018   PLT 242 08/31/2018   Lab Results  Component Value Date   CREATININE 1.03 (H) 08/31/2018   BUN 17 08/31/2018   NA 135 08/31/2018   K 3.5 08/31/2018   CL 97 (L) 08/31/2018   CO2 28 08/31/2018     Assessment & Plan    1.  Non-STEMI, subsequent episode of care/coronary artery disease: Status post presentation with throat burning and nausea to [facility] in mid November with subsequent finding of troponin elevation and LV dysfunction.  Catheterization showed multivessel disease and she subsequently was transferred to [facility] and underwent CABG x4.  She had a relatively uncomplicated postoperative course and has been doing well over the past few weeks.  She is ambulating some in her house without difficulty or recurrent symptoms.  She has been tolerating medications and is currently on aspirin 325, high potency statin, and beta-blocker.  She is interested in cardiac rehabilitation and awaits clearance from CT surgery.  I did stress the importance of her eventual enrollment.  In the setting of prior acute coronary syndrome and anemia, I will follow-up a CBC today and if stable, I will transition to low-dose aspirin and Plavix.  2.  Chronic combined CHF/ischemic cardiomyopathy: EF 40 to 45% by echo, 45 to 50% by TEE intraoperatively.  She has been weighing herself daily and weights have been stable.  She is euvolemic on exam today. We discussed the importance of daily weights, sodium restriction, medication compliance, and symptom reporting and  she verbalizes understanding.  She is currently on beta-blocker therapy.  She does not require diuretics.  She had previously been prescribed lisinopril but has never taken this.  We discussed the utility of lisinopril in patients who are both diabetic and also post ACS with a cardiomyopathy.  Her blood pressure is somewhat soft today at 112/60.  I have asked her to track her blood pressures at home and provided that pressures stable, I would have a low threshold to add either an ACE or an ARB.  Plan to follow-up echocardiogram in approximately 3  months.  3.  Hyperlipidemia: LDL was 147 on November 13.  She is now on high potency statin therapy.  Plan to follow-up lipids and LFTs at the end of this month.  4.  Type 2 diabetes mellitus: A1c was 8.5 in November.  Followed by primary care and she is on Amaryl and Januvia.  With heart disease and ischemic cardiomyopathy, will consider addition of an SGLT2 inhibitor in the future.   5.  Normocytic anemia: Follow-up CBC today.  6.  Disposition: Follow-up CBC and basic metabolic panel today.  Patient will follow-up in clinic with fasting lipids and LFTs at the end of this month.  She sees CT surgery on the 23rd.  Of note, her insurance changes on January 1 and [facility] will be out of network at that point.  She is actively searching for a different cardiology group and thinks that she will end up landing at [facility] because they are in network.  Murray Hodgkins, NP 09/12/2018, 10:54 AM

## 2018-09-13 ENCOUNTER — Other Ambulatory Visit: Payer: Self-pay | Admitting: Nurse Practitioner

## 2018-09-13 DIAGNOSIS — E1165 Type 2 diabetes mellitus with hyperglycemia: Secondary | ICD-10-CM

## 2018-09-13 LAB — BASIC METABOLIC PANEL
BUN/Creatinine Ratio: 22 (ref 12–28)
BUN: 19 mg/dL (ref 8–27)
CO2: 24 mmol/L (ref 20–29)
Calcium: 9.4 mg/dL (ref 8.7–10.3)
Chloride: 99 mmol/L (ref 96–106)
Creatinine, Ser: 0.85 mg/dL (ref 0.57–1.00)
GFR calc Af Amer: 84 mL/min/{1.73_m2} (ref >59)
GFR calc non Af Amer: 73 mL/min/{1.73_m2} (ref >59)
Glucose: 133 mg/dL — ABNORMAL HIGH (ref 65–99)
Potassium: 4.6 mmol/L (ref 3.5–5.2)
Sodium: 141 mmol/L (ref 134–144)

## 2018-09-13 LAB — CBC
Hematocrit: 30.1 % — ABNORMAL LOW (ref 34.0–46.6)
Hemoglobin: 9.7 g/dL — ABNORMAL LOW (ref 11.1–15.9)
MCH: 29.2 pg (ref 26.6–33.0)
MCHC: 32.2 g/dL (ref 31.5–35.7)
MCV: 91 fL (ref 79–97)
Platelets: 439 10*3/uL (ref 150–450)
RBC: 3.32 x10E6/uL — ABNORMAL LOW (ref 3.77–5.28)
RDW: 13.3 % (ref 12.3–15.4)
WBC: 6.2 10*3/uL (ref 3.4–10.8)

## 2018-09-13 MED ORDER — BLOOD GLUCOSE MONITOR KIT: PACK | 0 refills | Status: AC

## 2018-09-13 MED ORDER — BLOOD GLUCOSE MONITOR KIT: PACK | 0 refills | Status: DC

## 2018-09-13 MED ORDER — LANCETS MISC. KIT: 100.0000 | PACK | Freq: Two times a day (BID) | 3 refills | Status: DC

## 2018-09-13 MED ORDER — GLUCOSE BLOOD VI STRP: ORAL_STRIP | 12 refills | Status: AC

## 2018-09-13 NOTE — Telephone Encounter (Signed)
The prescription was sent over to [facility].

## 2018-09-13 NOTE — Telephone Encounter (Signed)
Covering inbox for Olivia Roth, AGPCNP-BC  Can you send correct rx / call [facility] to send this for patient?  She should be testing up to 2 times daily - can give 90 day supply  Olivia PilarAlexander Candelaria Pies, DO [facility] 09/13/2018, 1:51 PM

## 2018-09-13 NOTE — Telephone Encounter (Signed)
Pt called requesting refill on easy touch stripes called into [facility]

## 2018-09-13 NOTE — Addendum Note (Signed)
Addended by: Lonna CobbUSSELL, KELITA L on: 09/13/2018 02:39 PM   Modules accepted: Orders

## 2018-09-16 ENCOUNTER — Other Ambulatory Visit: Payer: Self-pay

## 2018-09-16 ENCOUNTER — Telehealth: Payer: Self-pay | Admitting: Nurse Practitioner

## 2018-09-16 DIAGNOSIS — E1165 Type 2 diabetes mellitus with hyperglycemia: Secondary | ICD-10-CM

## 2018-09-16 MED ORDER — METFORMIN HCL 1000 MG PO TABS: 1000.0000 mg | ORAL_TABLET | Freq: Two times a day (BID) | ORAL | 1 refills | Status: DC

## 2018-09-16 MED ORDER — METOPROLOL TARTRATE 25 MG PO TABS: 25.0000 mg | ORAL_TABLET | Freq: Two times a day (BID) | ORAL | 3 refills | Status: DC

## 2018-09-16 MED ORDER — ATORVASTATIN CALCIUM 80 MG PO TABS: 80.0000 mg | ORAL_TABLET | Freq: Every day | ORAL | 3 refills | Status: DC

## 2018-09-16 MED ORDER — GLIMEPIRIDE 2 MG PO TABS: 2.0000 mg | ORAL_TABLET | Freq: Every day | ORAL | 3 refills | Status: DC

## 2018-09-16 MED ORDER — SITAGLIPTIN PHOSPHATE 50 MG PO TABS: 50.0000 mg | ORAL_TABLET | Freq: Every day | ORAL | 0 refills | Status: DC

## 2018-09-16 NOTE — Telephone Encounter (Signed)
Pt was seen by Dr. Brion AlimentBerge Thursday and did labs.  She she still need to do labs here [phone].  Pt also needs refills on januuvia, metoprolol and atorvastatin, glimepiride stent to [facility].

## 2018-09-18 ENCOUNTER — Telehealth: Payer: Self-pay | Admitting: *Deleted

## 2018-09-18 ENCOUNTER — Other Ambulatory Visit: Payer: Managed Care, Other (non HMO)

## 2018-09-18 MED ORDER — CLOPIDOGREL BISULFATE 75 MG PO TABS: 75.0000 mg | ORAL_TABLET | Freq: Every day | ORAL | 3 refills | Status: DC

## 2018-09-18 MED ORDER — ASPIRIN EC 81 MG PO TBEC: 81.0000 mg | DELAYED_RELEASE_TABLET | Freq: Every day | ORAL | 3 refills | Status: AC

## 2018-09-18 NOTE — Telephone Encounter (Signed)
-----   Message from Creig Hineshristopher Ronald Berge, NP sent at 09/18/2018  8:55 AM EST ----- Hi ladies,  After reviewing Ms. Olivia Roth' CBC, I had intended to reduce her ASA dose to 81mg  daily and add plavix 75mg  daily given presentation with MI prior to CABG.  I neglected to say this in my result note.  Could you pls contact her and let her know (we did discuss that-that's what we'd do, when she was seen in clinic).  Thanks,  Thayer Ohmhris

## 2018-09-18 NOTE — Telephone Encounter (Signed)
I spoke with the patient.  She is aware of Ward GivensChris Berge, NP's recommendations to decrease ASA to 81 mg once daily and start plavix 75 mg once daily. She did recall this conversation with Thayer OhmChris and is agreeable with the above recommendations.   RX for plavix to be sent to [facility].

## 2018-09-27 ENCOUNTER — Other Ambulatory Visit: Payer: Self-pay | Admitting: Cardiothoracic Surgery

## 2018-09-27 DIAGNOSIS — Z951 Presence of aortocoronary bypass graft: Secondary | ICD-10-CM

## 2018-09-30 ENCOUNTER — Telehealth: Payer: Self-pay | Admitting: Nurse Practitioner

## 2018-09-30 ENCOUNTER — Ambulatory Visit (INDEPENDENT_AMBULATORY_CARE_PROVIDER_SITE_OTHER): Payer: Self-pay | Admitting: Physician Assistant

## 2018-09-30 ENCOUNTER — Ambulatory Visit
Admission: RE | Admit: 2018-09-30 | Discharge: 2018-09-30 | Disposition: A | Discharge status: Home / Self Care | Payer: Managed Care, Other (non HMO) | Source: Ambulatory Visit | Attending: Cardiothoracic Surgery | Admitting: Cardiothoracic Surgery

## 2018-09-30 VITALS — BP 132/72 | HR 79 | Resp 20 | Ht 61.0 in | Wt 157.0 lb

## 2018-09-30 DIAGNOSIS — Z951 Presence of aortocoronary bypass graft: Secondary | ICD-10-CM

## 2018-09-30 DIAGNOSIS — I25119 Atherosclerotic heart disease of native coronary artery with unspecified angina pectoris: Secondary | ICD-10-CM

## 2018-09-30 NOTE — Telephone Encounter (Signed)
Pt needs a refill on one touch ultra test strips sent to [facility].

## 2018-09-30 NOTE — Progress Notes (Signed)
  HPI: Ms. Olivia Roth is a 62 year old female with a history of diabetes mellitus, dyslipidemia, gastroesophageal reflux disease, and anxiety.  She developed chest pain last month and presented to [facility] where she ruled in for non-ST elevation myocardial infarction.  Left heart  Cath demonstrated severe MVCAD and EF 30-35%.  [facility].  Our service was consulted for consideration of coronary bypass grafting.  After evaluation by Dr. Donata ClayVan Trigt, coronary bypass grafting was offered and she elected to proceed.  She was taken the operating room on 08/26/2018 where CABG x4 was performed without complication.  Her postoperative course was uneventful.  Follow-up echocardiography demonstrated improvement of LV function to 40 to 45%.  She has been seen in follow-up by Mr. Nicolasa Duckinghristopher Berge, NP on 09/12/18.   Plavix was added to her medication regimen at that time and aspirin adjusted to 81mg  daily.  Patient returns for routine postoperative follow-up having undergone CABGx4 by Dr. Donata ClayVan Trigt The patient's early postoperative recovery while in the hospital was uneventful. Since hospital discharge the patient reports continued progress.  She denies any shortness of breath or any recurrence of chest pain.  She is anxious to begin cardiac rehab.   VS:  BP-132/72  Pulse-79  Respirations-20  SPO2-96% on room air  Physical Exam   Heart: Regular rate and rhythm without murmur.  Chest: Breath sounds are clear, full, and equal.  Incisions: Sternotomy incision is healing with no sign of complication.  There is a 1 cm segment of a suture that was exposed in the midportion of the incision.  The site was prepped with alcohol and the suture was removed.  Sternum was stable.  Chest tube sites are well-healed.  Ext: No peripheral edema.  The left lower leg incisions are all intact and dry.  There is no palpable hematoma.  No erythema.   Diagnostic Tests: CLINICAL DATA:  s/p  cabg.  EXAM: CHEST - 2 VIEW  COMPARISON:  08/30/2018.  FINDINGS: Prior CABG. Heart size normal. Lung volumes. No focal infiltrate. Small left pleural effusion. No pneumothorax.  IMPRESSION: 1.  Prior CABG.  Heart size normal.  2.  Lung volumes.  Small left pleural effusion.   Electronically Signed   By: Maisie Fushomas  Register   On: 09/30/2018 12:40   Impression / Plan: Multivessel coronary artery disease presenting with acute non-ST elevation myocardial infarction.  She is now approximately 5 weeks status post CABG x4.  She had uneventful postoperative course and she is continued to make steady progress with her mobility and endurance since her discharge home.  She is anxious to get started with cardiac rehab and I agree this is appropriate.  Sternal precautions were reviewed.  Medications were reviewed and no changes are necessary from CT surgery standpoint. She may resume driving with supervision.  No further follow up is scheduled but I encouraged her and her family to contact us if we may assist further in her care.   Leary RocaMyron G. Roddenberry, PA-C [facility] [phone]

## 2018-09-30 NOTE — Telephone Encounter (Signed)
The pt was notified that she needs to contact her pharmacy for refills.

## 2018-10-03 NOTE — Progress Notes (Signed)
Office Visit    Patient Name: Olivia Roth Date of Encounter: 10/04/2018  Primary Care Provider:  Mikey College, NP Primary Cardiologist:  Nelva Bush, MD  Chief Complaint    62 year old female with a history of hyperlipidemia, diabetes, anxiety, and GERD, who presents for follow-up related to recent non-STEMI and CABG x4.  Past Medical History    Past Medical History:  Diagnosis Date  . Anxiety   . Asthma    In cold weather (08/21/2018)  . CAD (coronary artery disease)    a. 08/2018 NSTEMI/Cath: LM 50d,LAD 80ost/p, 30m70d, LCX 90ost, OM1 50, OM3 70, OM4 80, LPDA small, RCA  99p/d, EF 30-35%; c. 08/2018 CABG x 4: LIMA->LAD, VG->RI, VG->OM, VG->RCA.  .Marland KitchenChronic combined systolic (congestive) and diastolic (congestive) heart failure ([place]    a. 08/2018 Echo: EF 40-45%, mid-apicalanteroseptal and apical HK. Gr2 DD. Mild AI; b. 08/2018 intraop TEE: EF 45-50%, antsept, infsept HK. Mild AI. Mildly dil Ao root. Trace MR.  . Family history of adverse reaction to anesthesia    "mother PONV; talked crazy/loud after anesthesia" (08/21/2018)  . GERD (gastroesophageal reflux disease)   . High cholesterol   . Ischemic cardiomyopathy    a. 08/2018 Echo: EF 40-45%; b. 08/2018 TEE: EF 45-50%.  . Seasonal allergies   . Tuberculosis ~ 1960  . Type II diabetes mellitus ([place]   . UTI (lower urinary tract infection)    "once; before hysterectomy" (08/21/2018)   Past Surgical History:  Procedure Laterality Date  . CORONARY ARTERY BYPASS GRAFT N/A 08/26/2018   Procedure: CORONARY ARTERY BYPASS GRAFTING (CABG) times four using left internal mammary artery and right leg saphenous vein grafts;  Surgeon: VIvin Poot MD;  [place]  Service: Open Heart Surgery;  Laterality: N/A;  . GANGLION CYST EXCISION Left ~ 1960   neck  . LEFT HEART CATH AND CORONARY ANGIOGRAPHY N/A 08/21/2018   Procedure: LEFT HEART CATH AND CORONARY ANGIOGRAPHY;  Surgeon: ENelva Bush MD;   [facility];  Service: Cardiovascular;  Laterality: N/A;  . TEE WITHOUT CARDIOVERSION N/A 08/26/2018   Procedure: TRANSESOPHAGEAL ECHOCARDIOGRAM (TEE);  Surgeon: VPrescott Gum PCollier Salina MD;  [place]  Service: Open Heart Surgery;  Laterality: N/A;  . TOTAL ABDOMINAL HYSTERECTOMY  2006    Allergies  Allergies  Allergen Reactions  . Shellfish Allergy Hives  . Avocado     UNSPECIFIED REACTION   . Advair Diskus [Fluticasone-Salmeterol] Palpitations  . Cortisone Other (See Comments)    drowsiness  . Pecan Nut (Diagnostic) Itching  . Salmon [Fish Allergy] Rash    Rash and itching of ears    History of Present Illness    62year old female with above past medical history including GERD, hyperlipidemia, seasonal allergies, diabetes, and anxiety.  [facility] and burning, radiating to her throat.  Initial troponin was elevated at 0.14 and subsequently peaked at 3.93.  Echocardiogram showed LV dysfunction with an EF of 40 to 45%.  Diagnostic catheterization showed severe multivessel CAD with an EF of 30 to 35%.  [facility]  Postoperative course was relatively uncomplicated.  She followed up in early December, at which time she was doing well.  Labs were stable at that time and ASA was reduced to 825m plavix 7551mdded.  Since her last visit, she has done remarkably well.  She is walking regularly around  her home and has also followed up with CT surgery and cleared to resume driving and start cardiac rehab.  She occasionally notes chest wall pain with certain position changes but has not had any angina, dyspnea, PND, orthopnea, palpitations, dizziness, syncope, or early satiety.  She occasionally notes lower extremity swelling but says that this has been significantly improved by walking more during the day.  All of her vein graft wounds have been healing  well.  Home Medications    Prior to Admission medications   Medication Sig Start Date End Date Taking? Authorizing Provider  acetaminophen (TYLENOL) 500 MG tablet Take 2 tablets (1,000 mg total) by mouth every 6 (six) hours as needed. 08/31/18   Barrett, Erin R, PA-C  albuterol (PROVENTIL HFA;VENTOLIN HFA) 108 (90 Base) MCG/ACT inhaler Inhale 2 puffs into the lungs every 6 (six) hours as needed for wheezing or shortness of breath. 09/10/17   Mikey College, NP  aspirin EC 81 MG tablet Take 1 tablet (81 mg total) by mouth daily. 09/18/18   Theora Gianotti, NP  atorvastatin (LIPITOR) 80 MG tablet Take 1 tablet (80 mg total) by mouth daily at 6 PM. 09/16/18   Mikey College, NP  blood glucose meter kit and supplies KIT Dispense based on patient and insurance preference. two times daily as directed. For E11.65 09/13/18   Olin Hauser, DO  clopidogrel (PLAVIX) 75 MG tablet Take 1 tablet (75 mg total) by mouth daily. 09/18/18   Theora Gianotti, NP  glimepiride (AMARYL) 2 MG tablet Take 1 tablet (2 mg total) by mouth daily before breakfast. 09/16/18   Mikey College, NP  glucose blood test strip Use as instructed 09/13/18   Olin Hauser, DO  Lancets Misc. KIT 100 each by Does not apply route 2 (two) times daily. 09/13/18   Karamalegos, Devonne Doughty, DO  metFORMIN (GLUCOPHAGE) 1000 MG tablet Take 1 tablet (1,000 mg total) by mouth 2 (two) times daily with a meal. 09/16/18   Mikey College, NP  metoprolol tartrate (LOPRESSOR) 25 MG tablet Take 1 tablet (25 mg total) by mouth 2 (two) times daily. 09/16/18   Mikey College, NP  potassium chloride SA (K-DUR,KLOR-CON) 20 MEQ tablet Take 1 tablet (20 mEq total) by mouth daily. 08/31/18   Barrett, Erin R, PA-C  sitaGLIPtin (JANUVIA) 50 MG tablet Take 1 tablet (50 mg total) by mouth daily. 09/16/18   Mikey College, NP  traMADol (ULTRAM) 50 MG tablet Take 1-2 tablets (50-100 mg total) by  mouth every 4 (four) hours as needed for moderate pain. 08/31/18   Barrett, Lodema Hong, PA-C    Review of Systems    Doing well since her last visit.  Occasional chest wall pain and mild lower extremity swelling which improves with walking more frequently and keeping her legs elevated.  She denies angina, dyspnea, palpitations, PND, orthopnea, dizziness, syncope, or early satiety.  All other systems reviewed and are otherwise negative except as noted above.  Physical Exam    VS:  BP 104/64   Pulse 83   Ht 5' 1" (1.549 m)   Wt 152 lb 12.8 oz (69.3 kg)   SpO2 99%   BMI 28.87 kg/m  , BMI Body mass index is 28.87 kg/m. GEN: Well nourished, well developed, in no acute distress. HEENT: normal. Neck: Supple, no JVD, carotid bruits, or masses. Cardiac: RRR, no murmurs, rubs, or gallops. No clubbing, cyanosis, edema.  Radials/DP/PT 2+ and equal bilaterally.  Right lower  extremity incisions healing well without erythema or drainage. Respiratory:  Respirations regular and unlabored, clear to auscultation bilaterally. GI: Soft, nontender, nondistended, BS + x 4. MS: no deformity or atrophy. Skin: warm and dry, no rash. Neuro:  Strength and sensation are intact. Psych: Normal affect.  Accessory Clinical Findings    ECG personally reviewed by me today -regular sinus rhythm, 83, left axis deviation, inferior infarct with inferior and anterolateral T wave inversion - no acute changes.  Lab Results  Component Value Date   WBC 6.2 09/12/2018   HGB 9.7 (L) 09/12/2018   HCT 30.1 (L) 09/12/2018   MCV 91 09/12/2018   PLT 439 09/12/2018   Lab Results  Component Value Date   CREATININE 0.85 09/12/2018   BUN 19 09/12/2018   NA 141 09/12/2018   K 4.6 09/12/2018   CL 99 09/12/2018   CO2 24 09/12/2018    Lab Results  Component Value Date   CHOL 252 (H) 08/21/2018   HDL 33 (L) 08/21/2018   LDLCALC 147 (H) 08/21/2018   TRIG 360 (H) 08/21/2018   CHOLHDL 7.6 08/21/2018    Assessment & Plan     1.  Coronary artery disease: Status post non-STEMI in mid November with catheterization revealing multivessel CAD requiring CABG [facility]4 at [facility].  She has done well since her last visit on December 5.  She is eager to begin cardiac rehab and has an appointment for orientation within the next 2 weeks.  She has been cleared by cardiac surgery.  She has been watching her caloric intake closely and really limiting salt.  Weight is down 8 pounds since her last visit.  She remains on aspirin, Plavix, beta-blocker, and statin therapy.  Follow-up lipids and LFTs today.  2.  Chronic combined systolic and diastolic congestive heart failure/ischemic cardiomyopathy: EF 40 to 45% by echo and 45 to 50% by intraoperative TEE.  She has been very diligent about limiting salt intake.  She has been losing weight in the setting of reduced caloric intake.  She has not been having any dyspnea or significant lower extremity swelling.  She remains on beta-blocker therapy.  Blood pressures trend low at home, similar to today at 104/64.  In that setting, she is not on ACE or ARB.  3.  Hyperlipidemia: LDL was 147 on November 13.  She remains on Lipitor 80 mg.  Follow-up lipids and LFTs today.  She is fasting.  4.  Type 2 diabetes mellitus: A1c was 8.5 in November.  She is followed closely by primary care and remains on Amaryl and Januvia.  With heart disease and ischemic cardiomyopathy, would have a low threshold to initiate an SGLT2 inhibitor if felt to be appropriate by her primary care provider.  5.  Normocytic anemia: This was stable by H&H earlier this month.  6.  Disposition: Follow-up lipids and LFTs today.  As previously noted, [facility] system will be out of network for her insurance as of January 1.  She has decided to continue care through our practice and also looks forward to cardiac rehab at [facility].  Follow-up with Dr. Saunders Revel in 3 months.  Murray Hodgkins, NP 10/04/2018, 8:24 AM

## 2018-10-04 ENCOUNTER — Encounter: Payer: Self-pay | Admitting: Nurse Practitioner

## 2018-10-04 ENCOUNTER — Other Ambulatory Visit: Payer: Managed Care, Other (non HMO)

## 2018-10-04 ENCOUNTER — Ambulatory Visit (INDEPENDENT_AMBULATORY_CARE_PROVIDER_SITE_OTHER): Payer: Managed Care, Other (non HMO) | Admitting: Nurse Practitioner

## 2018-10-04 VITALS — BP 104/64 | HR 83 | Ht 61.0 in | Wt 152.8 lb

## 2018-10-04 DIAGNOSIS — E785 Hyperlipidemia, unspecified: Secondary | ICD-10-CM

## 2018-10-04 DIAGNOSIS — I255 Ischemic cardiomyopathy: Secondary | ICD-10-CM | POA: Diagnosis not present

## 2018-10-04 DIAGNOSIS — I5042 Chronic combined systolic (congestive) and diastolic (congestive) heart failure: Secondary | ICD-10-CM

## 2018-10-04 DIAGNOSIS — I2510 Atherosclerotic heart disease of native coronary artery without angina pectoris: Secondary | ICD-10-CM

## 2018-10-04 DIAGNOSIS — E119 Type 2 diabetes mellitus without complications: Secondary | ICD-10-CM

## 2018-10-04 NOTE — Patient Instructions (Signed)
Medication Instructions:  Your physician recommends that you continue on your current medications as directed. Please refer to the Current Medication list given to you today.  If you need a refill on your cardiac medications before your next appointment, please call your pharmacy.   Lab work: Your physician recommends that you return for lab work today (Lipids and LFTs ) If you have labs (blood work) drawn today and your tests are completely normal, you will receive your results only by: Marland Kitchen. MyChart Message (if you have MyChart) OR . A paper copy in the mail If you have any lab test that is abnormal or we need to change your treatment, we will call you to review the results.  Testing/Procedures: None ordered   Follow-Up: At [facility], you and your health needs are our priority.  As part of our continuing mission to provide you with exceptional heart care, we have created designated Provider Care Teams.  These Care Teams include your primary Cardiologist (physician) and Advanced Practice Providers (APPs -  Physician Assistants and Nurse Practitioners) who all work together to provide you with the care you need, when you need it. You will need a follow up appointment in 3 months. You may see Yvonne Kendallhristopher End, MD or one of the following Advanced Practice Providers on your designated Care Team:   Nicolasa Duckinghristopher Berge, NP Eula Listenyan Dunn, PA-C . Marisue IvanJacquelyn Visser, PA-C

## 2018-10-05 LAB — LIPID PANEL
Chol/HDL Ratio: 3.6 ratio (ref 0.0–4.4)
Cholesterol, Total: 120 mg/dL (ref 100–199)
HDL: 33 mg/dL — ABNORMAL LOW (ref >39)
LDL Calculated: 50 mg/dL (ref 0–99)
Triglycerides: 186 mg/dL — ABNORMAL HIGH (ref 0–149)
VLDL Cholesterol Cal: 37 mg/dL (ref 5–40)

## 2018-10-05 LAB — HEPATIC FUNCTION PANEL
ALT: 16 IU/L (ref 0–32)
AST: 21 IU/L (ref 0–40)
Albumin: 4.2 g/dL (ref 3.6–4.8)
Alkaline Phosphatase: 114 IU/L (ref 39–117)
Bilirubin Total: 0.4 mg/dL (ref 0.0–1.2)
Bilirubin, Direct: 0.14 mg/dL (ref 0.00–0.40)
Total Protein: 7.4 g/dL (ref 6.0–8.5)

## 2018-10-11 ENCOUNTER — Other Ambulatory Visit: Payer: Self-pay

## 2018-10-11 DIAGNOSIS — E1165 Type 2 diabetes mellitus with hyperglycemia: Secondary | ICD-10-CM

## 2018-10-11 MED ORDER — METFORMIN HCL 1000 MG PO TABS: 1000.0000 mg | ORAL_TABLET | Freq: Two times a day (BID) | ORAL | 1 refills | Status: DC

## 2018-10-11 MED ORDER — METOPROLOL TARTRATE 25 MG PO TABS: 25.0000 mg | ORAL_TABLET | Freq: Two times a day (BID) | ORAL | 1 refills | Status: DC

## 2018-10-11 MED ORDER — SITAGLIPTIN PHOSPHATE 50 MG PO TABS: 50.0000 mg | ORAL_TABLET | Freq: Every day | ORAL | 1 refills | Status: DC

## 2018-10-11 MED ORDER — GLIMEPIRIDE 2 MG PO TABS: 2.0000 mg | ORAL_TABLET | Freq: Every day | ORAL | 1 refills | Status: DC

## 2018-10-11 MED ORDER — ATORVASTATIN CALCIUM 80 MG PO TABS: 80.0000 mg | ORAL_TABLET | Freq: Every day | ORAL | 1 refills | Status: DC

## 2018-10-14 ENCOUNTER — Other Ambulatory Visit: Payer: Self-pay | Admitting: *Deleted

## 2018-10-14 ENCOUNTER — Telehealth: Payer: Self-pay | Admitting: Nurse Practitioner

## 2018-10-14 ENCOUNTER — Ambulatory Visit: Payer: Managed Care, Other (non HMO) | Admitting: Physician Assistant

## 2018-10-14 ENCOUNTER — Telehealth: Payer: Self-pay | Admitting: Internal Medicine

## 2018-10-14 DIAGNOSIS — E1165 Type 2 diabetes mellitus with hyperglycemia: Secondary | ICD-10-CM

## 2018-10-14 MED ORDER — METFORMIN HCL 500 MG PO TABS: 1000.0000 mg | ORAL_TABLET | Freq: Two times a day (BID) | ORAL | 1 refills | Status: DC

## 2018-10-14 MED ORDER — CLOPIDOGREL BISULFATE 75 MG PO TABS: 75.0000 mg | ORAL_TABLET | Freq: Every day | ORAL | 0 refills | Status: DC

## 2018-10-14 NOTE — Telephone Encounter (Signed)
Please let patient know that she may not get another fill for 90 days after her last pill.   500 mg tablets have been sent to [facility].

## 2018-10-14 NOTE — Telephone Encounter (Signed)
°*  STAT* If patient is at the pharmacy, call can be transferred to refill team.   1. Which medications need to be refilled? (please list name of each medication and dose if known) Plavix 75 MG - 1 tablet daily  2. Which pharmacy/location (including street and city if local pharmacy) is medication to be sent to? [facility]   3. Do they need a 30 day or 90 day supply? 90 day

## 2018-10-14 NOTE — Telephone Encounter (Signed)
Pt called requesting  Metformin changed from 1000 to 500 mg  Pt states that she can take that better. Mail order

## 2018-10-14 NOTE — Telephone Encounter (Signed)
Medication was already sent in.  clopidogrel (PLAVIX) 75 MG tablet 90 tablet 0 10/14/2018    Sig - Route: Take 1 tablet (75 mg total) by mouth daily. - Oral   Sent to pharmacy as: clopidogrel (PLAVIX) 75 MG tablet   E-Prescribing Status: Receipt confirmed by pharmacy (10/14/2018 10:25 AM EST)   Pharmacy   [facility] - [place], [place] - [address]

## 2018-10-21 ENCOUNTER — Encounter: Payer: Self-pay | Admitting: Nurse Practitioner

## 2018-10-21 ENCOUNTER — Ambulatory Visit (INDEPENDENT_AMBULATORY_CARE_PROVIDER_SITE_OTHER): Payer: 59 | Admitting: Nurse Practitioner

## 2018-10-21 ENCOUNTER — Other Ambulatory Visit: Payer: Self-pay

## 2018-10-21 VITALS — BP 123/56 | HR 77 | Temp 98.5°F | Ht 61.0 in | Wt 152.6 lb

## 2018-10-21 DIAGNOSIS — E1165 Type 2 diabetes mellitus with hyperglycemia: Secondary | ICD-10-CM

## 2018-10-21 NOTE — Patient Instructions (Addendum)
Olivia MoselleMaria D Roth,   Thank you for coming in to clinic today.  1. Continue all of your great work! - If you have low blood sugars, call clinic.    Please schedule a follow-up appointment with Wilhelmina McardleLauren Deano Tomaszewski, AGNP. Return in about 6 weeks (around 12/02/2018) for diabetes.  If you have any other questions or concerns, please feel free to call the clinic or send a message through MyChart. You may also schedule an earlier appointment if necessary.  You will receive a survey after today's visit either digitally by e-mail or paper by [employer] mail. Your experiences and feedback matter to us.  Please respond so we know how we are doing as we provide care for you.  Wilhelmina McardleLauren Davanta Meuser, DNP, AGNP-BC Adult Gerontology Nurse Practitioner [facility]

## 2018-10-21 NOTE — Progress Notes (Signed)
Subjective:    Patient ID: Olivia Roth, female    DOB: [birth year], 63 y.o.   MRN: [id number]  Olivia Roth is a 63 y.o. female presenting on 10/21/2018 for Diabetes   HPI Diabetes Pt presents today for follow up of Type 2 diabetes mellitus. She is checking fasting am CBG at home with a range of 91-193, usually 91-120 over last 2-3 weeks - Current diabetic medications include: metformin, glimepiride, januvia - She is not currently symptomatic.  - She denies polydipsia, polyphagia, polyuria, headaches, diaphoresis, shakiness, chills, pain, numbness or tingling in extremities and changes in vision.   - Clinical course has been improving. - She  reports an exercise routine that includes walking, 5-6 days per week. - Her diet is low in salt, moderate in fat, and moderate in carbohydrates. Experimenting more with vegetables.   Is working with nutritional facts at grocery stores. Eating more salad, asparagus, broccoli, chicken. Eating fewer carbs, low salt diet.   - Weight trend: decreasing steadily  Recent Labs    08/21/18 0626 08/23/18 0448  HGBA1C 8.4* 8.5*    Social History   Tobacco Use  . Smoking status: Never Smoker  . Smokeless tobacco: Never Used  Substance Use Topics  . Alcohol use: Yes    Comment: 08/21/2018 "glass of sangria or a beer 3-4 times/year"  . Drug use: Never    Review of Systems Per HPI unless specifically indicated above     Objective:    BP (!) 123/56 (BP Location: Right Arm, Patient Position: Sitting, Cuff Size: Normal)   Pulse 77   Temp 98.5 F (36.9 C) (Oral)   Ht 5\' 1"  (1.549 m)   Wt 152 lb 9.6 oz (69.2 kg)   BMI 28.83 kg/m   Wt Readings from Last 3 Encounters:  10/21/18 152 lb 9.6 oz (69.2 kg)  10/04/18 152 lb 12.8 oz (69.3 kg)  09/30/18 157 lb (71.2 kg)    Physical Exam Vitals signs reviewed.  Constitutional:      General: She is not in acute distress.    Appearance: She is well-developed.  HENT:     Head: Normocephalic and  atraumatic.  Cardiovascular:     Rate and Rhythm: Normal rate and regular rhythm.     Pulses:          Radial pulses are 2+ on the right side and 2+ on the left side.       Posterior tibial pulses are 1+ on the right side and 1+ on the left side.     Heart sounds: Normal heart sounds, S1 normal and S2 normal.  Pulmonary:     Effort: Pulmonary effort is normal. No respiratory distress.     Breath sounds: Normal breath sounds and air entry.  Musculoskeletal:     Right lower leg: No edema.     Left lower leg: No edema.  Skin:    General: Skin is warm and dry.     Capillary Refill: Capillary refill takes less than 2 seconds.  Neurological:     Mental Status: She is alert and oriented to person, place, and time.  Psychiatric:        Attention and Perception: Attention normal.        Mood and Affect: Mood and affect normal.        Behavior: Behavior normal. Behavior is cooperative.      Results for orders placed or performed in visit on 10/04/18  Lipid panel  Result Value Ref  Range   Cholesterol, Total 120 100 - 199 mg/dL   Triglycerides 476 (H) 0 - 149 mg/dL   HDL 33 (L) >54 mg/dL   VLDL Cholesterol Cal 37 5 - 40 mg/dL   LDL Calculated 50 0 - 99 mg/dL   Chol/HDL Ratio 3.6 0.0 - 4.4 ratio  Hepatic function panel  Result Value Ref Range   Total Protein 7.4 6.0 - 8.5 g/dL   Albumin 4.2 3.6 - 4.8 g/dL   Bilirubin Total 0.4 0.0 - 1.2 mg/dL   Bilirubin, Direct 6.50 0.00 - 0.40 mg/dL   Alkaline Phosphatase 114 39 - 117 IU/L   AST 21 0 - 40 IU/L   ALT 16 0 - 32 IU/L      Assessment & Plan:   Problem List Items Addressed This Visit      Endocrine   DM (diabetes mellitus), type 2, uncontrolled (HCC) - Primary     UncontrolledDM with A1c at last visit.  Improving and nearing much better control with last 6 weeks of CBG checks.  - Has had one hypoglycemic event.  - Complications - hyperlipidemia, CAD.  Plan:  1. Continue current therapy: metformin, Januvia, glimepiride 2.  Encourage improved lifestyle: - low carb/low glycemic diet reinforced prior education - Increase physical activity to 30 minutes most days of the week.  Explained that increased physical activity increases body's use of sugar for energy. 3. Check fasting am CBG and bring log to next visit for review 4. Continue ASA and Statin 5. Advised to schedule DM ophtho exam, send record. 6. Follow-up 6 weeks with A1c   Follow up plan: Return in about 6 weeks (around 12/02/2018) for diabetes.  Wilhelmina Mcardle, DNP, AGPCNP-BC Adult Gerontology Primary Care Nurse Practitioner [facility] 10/21/2018, 10:13 AM

## 2018-10-24 ENCOUNTER — Encounter: Payer: Self-pay | Admitting: *Deleted

## 2018-10-24 ENCOUNTER — Encounter: Payer: 59 | Attending: Internal Medicine | Admitting: *Deleted

## 2018-10-24 VITALS — Ht 61.75 in | Wt 153.5 lb

## 2018-10-24 DIAGNOSIS — Z7984 Long term (current) use of oral hypoglycemic drugs: Secondary | ICD-10-CM | POA: Insufficient documentation

## 2018-10-24 DIAGNOSIS — I255 Ischemic cardiomyopathy: Secondary | ICD-10-CM | POA: Diagnosis not present

## 2018-10-24 DIAGNOSIS — I2510 Atherosclerotic heart disease of native coronary artery without angina pectoris: Secondary | ICD-10-CM | POA: Insufficient documentation

## 2018-10-24 DIAGNOSIS — Z951 Presence of aortocoronary bypass graft: Secondary | ICD-10-CM

## 2018-10-24 DIAGNOSIS — Z7982 Long term (current) use of aspirin: Secondary | ICD-10-CM | POA: Insufficient documentation

## 2018-10-24 DIAGNOSIS — E119 Type 2 diabetes mellitus without complications: Secondary | ICD-10-CM | POA: Insufficient documentation

## 2018-10-24 DIAGNOSIS — Z79899 Other long term (current) drug therapy: Secondary | ICD-10-CM | POA: Insufficient documentation

## 2018-10-24 DIAGNOSIS — E7800 Pure hypercholesterolemia, unspecified: Secondary | ICD-10-CM | POA: Diagnosis not present

## 2018-10-24 DIAGNOSIS — J45909 Unspecified asthma, uncomplicated: Secondary | ICD-10-CM | POA: Insufficient documentation

## 2018-10-24 DIAGNOSIS — Z7902 Long term (current) use of antithrombotics/antiplatelets: Secondary | ICD-10-CM | POA: Insufficient documentation

## 2018-10-24 NOTE — Progress Notes (Signed)
Daily Session Note  Patient Details  Name: Olivia Roth MRN: [id number] Date of Birth: [birth year] Referring Provider:     Cardiac Rehab from 10/24/2018 in [facility]  Referring Provider  End      Encounter Date: 10/24/2018  Check In: Session Check In - 10/24/18 1323      Check-In   Supervising physician immediately available to respond to emergencies  See telemetry face sheet for immediately available ER MD    Location  [facility]-Cardiac & Pulmonary Rehab    Staff Present  Renita Papa, RN Geralyn Corwin, RN Vickki Hearing, BA, ACSM CEP, Exercise Physiologist    Medication changes reported      No    Fall or balance concerns reported     No    Tobacco Cessation  No Change    Warm-up and Cool-down  Performed as group-led instruction    Resistance Training Performed  Yes    VAD Patient?  No    PAD/SET Patient?  No      Pain Assessment   Currently in Pain?  No/denies    Multiple Pain Sites  No        Exercise Prescription Changes - 10/24/18 1400      Response to Exercise   Blood Pressure (Admit)  118/70    Blood Pressure (Exercise)  118/60    Blood Pressure (Exit)  108/72    Heart Rate (Admit)  73 bpm    Heart Rate (Exercise)  95 bpm    Heart Rate (Exit)  68 bpm    Oxygen Saturation (Admit)  98 %    Oxygen Saturation (Exit)  96 %    Rating of Perceived Exertion (Exercise)  11    Perceived Dyspnea (Exercise)  2       Social History   Tobacco Use  Smoking Status Never Smoker  Smokeless Tobacco Never Used    Goals Met:  Independence with exercise equipment Exercise tolerated well Personal goals reviewed No report of cardiac concerns or symptoms Strength training completed today  Goals Unmet:  Not Applicable  Comments: med review completed   Dr. Emily Filbert is Medical Director for [facility].

## 2018-10-24 NOTE — Progress Notes (Signed)
Cardiac Individual Treatment Plan  Patient Details  Name: MACAELA PRESAS MRN: [id number] Date of Birth: 02/22/1956 Referring Provider:     Cardiac Rehab from 10/24/2018 in [facility] Cardiac and Pulmonary Rehab  Referring Provider  End      Initial Encounter Date:    Cardiac Rehab from 10/24/2018 in [facility] Cardiac and Pulmonary Rehab  Date  10/24/18      Visit Diagnosis: S/P CABG x 4  Patient's Home Medications on Admission:  Current Outpatient Medications:  .  acetaminophen (TYLENOL) 500 MG tablet, Take 2 tablets (1,000 mg total) by mouth every 6 (six) hours as needed., Disp: 30 tablet, Rfl: 0 .  albuterol (PROVENTIL HFA;VENTOLIN HFA) 108 (90 Base) MCG/ACT inhaler, Inhale 2 puffs into the lungs every 6 (six) hours as needed for wheezing or shortness of breath., Disp: 1 Inhaler, Rfl: 11 .  aspirin EC 81 MG tablet, Take 1 tablet (81 mg total) by mouth daily., Disp: 90 tablet, Rfl: 3 .  atorvastatin (LIPITOR) 80 MG tablet, Take 1 tablet (80 mg total) by mouth daily at 6 PM., Disp: 90 tablet, Rfl: 1 .  blood glucose meter kit and supplies KIT, Dispense based on patient and insurance preference. two times daily as directed. For E11.65, Disp: 1 each, Rfl: 0 .  clopidogrel (PLAVIX) 75 MG tablet, Take 1 tablet (75 mg total) by mouth daily., Disp: 90 tablet, Rfl: 0 .  glimepiride (AMARYL) 2 MG tablet, Take 1 tablet (2 mg total) by mouth daily before breakfast., Disp: 90 tablet, Rfl: 1 .  glucose blood test strip, Use as instructed, Disp: 100 each, Rfl: 12 .  Lancets Misc. KIT, 100 each by Does not apply route 2 (two) times daily., Disp: 100 each, Rfl: 3 .  metFORMIN (GLUCOPHAGE) 500 MG tablet, Take 2 tablets (1,000 mg total) by mouth 2 (two) times daily with a meal. (Patient taking differently: Take 500 mg by mouth as directed. 1000 MG in the morning  And 500 MG at bedtime), Disp: 360 tablet, Rfl: 1 .  metoprolol tartrate (LOPRESSOR) 25 MG tablet, Take 1 tablet (25 mg total) by mouth 2 (two) times  daily., Disp: 180 tablet, Rfl: 1 .  sitaGLIPtin (JANUVIA) 50 MG tablet, Take 1 tablet (50 mg total) by mouth daily., Disp: 90 tablet, Rfl: 1  Past Medical History: Past Medical History:  Diagnosis Date  . Anxiety   . Asthma    In cold weather (08/21/2018)  . CAD (coronary artery disease)    a. 08/2018 NSTEMI/Cath: LM 50d,LAD 80ost/p, 31m70d, LCX 90ost, OM1 50, OM3 70, OM4 80, LPDA small, RCA  99p/d, EF 30-35%; c. 08/2018 CABG x 4: LIMA->LAD, VG->RI, VG->OM, VG->RCA.  .Marland KitchenChronic combined systolic (congestive) and diastolic (congestive) heart failure ([place]    a. 08/2018 Echo: EF 40-45%, mid-apicalanteroseptal and apical HK. Gr2 DD. Mild AI; b. 08/2018 intraop TEE: EF 45-50%, antsept, infsept HK. Mild AI. Mildly dil Ao root. Trace MR.  . Family history of adverse reaction to anesthesia    "mother PONV; talked crazy/loud after anesthesia" (08/21/2018)  . GERD (gastroesophageal reflux disease)   . High cholesterol   . Ischemic cardiomyopathy    a. 08/2018 Echo: EF 40-45%; b. 08/2018 TEE: EF 45-50%.  . Seasonal allergies   . Tuberculosis ~ 1960  . Type II diabetes mellitus ([place]   . UTI (lower urinary tract infection)    "once; before hysterectomy" (08/21/2018)    Tobacco Use: Social History   Tobacco Use  Smoking Status Never Smoker  Smokeless Tobacco Never Used    Labs: Recent Review Flowsheet Data    Labs for ITP Cardiac and Pulmonary Rehab Latest Ref Rng & Units 08/26/2018 08/27/2018 08/28/2018 08/29/2018 10/04/2018   Cholestrol 100 - 199 mg/dL - - - - 120   LDLCALC 0 - 99 mg/dL - - - - 50   HDL >39 mg/dL - - - - 33(L)   Trlycerides 0 - 149 mg/dL - - - - 186(H)   Hemoglobin A1c 4.8 - 5.6 % - - - - -   PHART 7.350 - 7.450 - - - - -   PCO2ART 32.0 - 48.0 mmHg - - - - -   HCO3 20.0 - 28.0 mmol/L - - - - -   TCO2 22 - 32 mmol/L 24 20(L) - - -   ACIDBASEDEF 0.0 - 2.0 mmol/L - - - - -   O2SAT % - 54.5 62.2 62.5 -       Exercise Target Goals: Exercise Program  Goal: Individual exercise prescription set using results from initial 6 min walk test and THRR while considering  patient's activity barriers and safety.   Exercise Prescription Goal: Initial exercise prescription builds to 30-45 minutes a day of aerobic activity, 2-3 days per week.  Home exercise guidelines will be given to patient during program as part of exercise prescription that the participant will acknowledge.  Activity Barriers & Risk Stratification: Activity Barriers & Cardiac Risk Stratification - 10/24/18 1339      Activity Barriers & Cardiac Risk Stratification   Activity Barriers  None    Cardiac Risk Stratification  High       6 Minute Walk: 6 Minute Walk    Row Name 10/24/18 1359         6 Minute Walk   Phase  Initial     Distance  1300 feet     Walk Time  6 minutes     # of Rest Breaks  0     MPH  2.46     METS  2.99     RPE  11     Perceived Dyspnea   2     VO2 Peak  10.49     Symptoms  No     Resting HR  71 bpm     Resting BP  118/70     Resting Oxygen Saturation   98 %     Exercise Oxygen Saturation  during 6 min walk  96 %     Max Ex. HR  95 bpm     Max Ex. BP  118/68     2 Minute Post BP  108/72        Oxygen Initial Assessment:   Oxygen Re-Evaluation:   Oxygen Discharge (Final Oxygen Re-Evaluation):   Initial Exercise Prescription: Initial Exercise Prescription - 10/24/18 1400      Date of Initial Exercise RX and Referring Provider   Date  10/24/18    Referring Provider  End      Treadmill   MPH  2.2    Grade  1    Minutes  15    METs  2.99      Recumbant Bike   Level  2    RPM  60    Minutes  15    METs  2.99      REL-XR   Level  3    Speed  50    Minutes  15    METs  2.99  Prescription Details   Frequency (times per week)  3    Duration  Progress to 30 minutes of continuous aerobic without signs/symptoms of physical distress      Intensity   THRR 40-80% of Max Heartrate  105-140    Ratings of Perceived  Exertion  11-13    Perceived Dyspnea  0-4      Resistance Training   Training Prescription  Yes    Weight  3 lb    Reps  10-15       Perform Capillary Blood Glucose checks as needed.  Exercise Prescription Changes: Exercise Prescription Changes    Row Name 10/24/18 1400             Response to Exercise   Blood Pressure (Admit)  118/70       Blood Pressure (Exercise)  118/60       Blood Pressure (Exit)  108/72       Heart Rate (Admit)  73 bpm       Heart Rate (Exercise)  95 bpm       Heart Rate (Exit)  68 bpm       Oxygen Saturation (Admit)  98 %       Oxygen Saturation (Exit)  96 %       Rating of Perceived Exertion (Exercise)  11       Perceived Dyspnea (Exercise)  2          Exercise Comments:   Exercise Goals and Review: Exercise Goals    Row Name 10/24/18 1359             Exercise Goals   Increase Physical Activity  Yes       Intervention  Provide advice, education, support and counseling about physical activity/exercise needs.;Develop an individualized exercise prescription for aerobic and resistive training based on initial evaluation findings, risk stratification, comorbidities and participant's personal goals.       Expected Outcomes  Short Term: Attend rehab on a regular basis to increase amount of physical activity.;Long Term: Add in home exercise to make exercise part of routine and to increase amount of physical activity.;Long Term: Exercising regularly at least 3-5 days a week.       Increase Strength and Stamina  Yes       Intervention  Provide advice, education, support and counseling about physical activity/exercise needs.;Develop an individualized exercise prescription for aerobic and resistive training based on initial evaluation findings, risk stratification, comorbidities and participant's personal goals.       Expected Outcomes  Short Term: Increase workloads from initial exercise prescription for resistance, speed, and METs.;Short Term: Perform  resistance training exercises routinely during rehab and add in resistance training at home;Long Term: Improve cardiorespiratory fitness, muscular endurance and strength as measured by increased METs and functional capacity (6MWT)       Able to understand and use rate of perceived exertion (RPE) scale  Yes       Intervention  Provide education and explanation on how to use RPE scale       Expected Outcomes  Short Term: Able to use RPE daily in rehab to express subjective intensity level;Long Term:  Able to use RPE to guide intensity level when exercising independently       Able to understand and use Dyspnea scale  Yes       Intervention  Provide education and explanation on how to use Dyspnea scale       Expected Outcomes  Short Term: Able  to use Dyspnea scale daily in rehab to express subjective sense of shortness of breath during exertion;Long Term: Able to use Dyspnea scale to guide intensity level when exercising independently       Knowledge and understanding of Target Heart Rate Range (THRR)  Yes       Intervention  Provide education and explanation of THRR including how the numbers were predicted and where they are located for reference       Expected Outcomes  Short Term: Able to state/look up THRR;Short Term: Able to use daily as guideline for intensity in rehab;Long Term: Able to use THRR to govern intensity when exercising independently       Able to check pulse independently  Yes       Intervention  Provide education and demonstration on how to check pulse in carotid and radial arteries.;Review the importance of being able to check your own pulse for safety during independent exercise       Expected Outcomes  Short Term: Able to explain why pulse checking is important during independent exercise;Long Term: Able to check pulse independently and accurately       Understanding of Exercise Prescription  Yes       Intervention  Provide education, explanation, and written materials on patient's  individual exercise prescription       Expected Outcomes  Short Term: Able to explain program exercise prescription;Long Term: Able to explain home exercise prescription to exercise independently          Exercise Goals Re-Evaluation :   Discharge Exercise Prescription (Final Exercise Prescription Changes): Exercise Prescription Changes - 10/24/18 1400      Response to Exercise   Blood Pressure (Admit)  118/70    Blood Pressure (Exercise)  118/60    Blood Pressure (Exit)  108/72    Heart Rate (Admit)  73 bpm    Heart Rate (Exercise)  95 bpm    Heart Rate (Exit)  68 bpm    Oxygen Saturation (Admit)  98 %    Oxygen Saturation (Exit)  96 %    Rating of Perceived Exertion (Exercise)  11    Perceived Dyspnea (Exercise)  2       Nutrition:  Target Goals: Understanding of nutrition guidelines, daily intake of sodium <1534m, cholesterol <2074m calories 30% from fat and 7% or less from saturated fats, daily to have 5 or more servings of fruits and vegetables.  Biometrics: Pre Biometrics - 10/24/18 1358      Pre Biometrics   Height  5' 1.75" (1.568 m)    Weight  153 lb 8 oz (69.6 kg)    Waist Circumference  33 inches    Hip Circumference  39 inches    Waist to Hip Ratio  0.85 %    BMI (Calculated)  28.32    Single Leg Stand  15.72 seconds        Nutrition Therapy Plan and Nutrition Goals: Nutrition Therapy & Goals - 10/24/18 1350      Intervention Plan   Intervention  Prescribe, educate and counsel regarding individualized specific dietary modifications aiming towards targeted core components such as weight, hypertension, lipid management, diabetes, heart failure and other comorbidities.;Nutrition handout(s) given to patient.    Expected Outcomes  Short Term Goal: Understand basic principles of dietary content, such as calories, fat, sodium, cholesterol and nutrients.;Long Term Goal: Adherence to prescribed nutrition plan.;Short Term Goal: A plan has been developed with  personal nutrition goals set during dietitian appointment.  Nutrition Assessments: Nutrition Assessments - 10/24/18 1350      MEDFICTS Scores   Pre Score  0   her diet has changed/improved greatly since her CABG      Nutrition Goals Re-Evaluation:   Nutrition Goals Discharge (Final Nutrition Goals Re-Evaluation):   Psychosocial: Target Goals: Acknowledge presence or absence of significant depression and/or stress, maximize coping skills, provide positive support system. Participant is able to verbalize types and ability to use techniques and skills needed for reducing stress and depression.   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      Family Dynamics   Good Support System?  Yes    Comments  family, husband      Barriers   Psychosocial barriers to participate in program  The patient should benefit from training in stress management and relaxation.      Screening Interventions   Interventions  Encouraged to exercise;Program counselor consult;To provide support and resources with identified psychosocial needs;Provide feedback about the scores to participant       Quality of Life Scores:  Quality of Life - 10/24/18 1349      Quality of Life   Select  Quality of Life      Quality of Life Scores   Health/Function Pre  28.15 %    Socioeconomic Pre  30 %    Psych/Spiritual Pre  29.64 %    Family Pre  30 %    GLOBAL Pre  29.17 %      Scores of 19 and below usually indicate a poorer quality of life in these areas.  A difference of  2-3 points is a clinically meaningful difference.  A difference of 2-3 points in the total score of the Quality of Life Index has been associated with  significant improvement in overall quality of life, self-image, physical symptoms, and general health in studies assessing change in quality of life.  PHQ-9: Recent Review Flowsheet Data    Depression screen [facility] 2/9 10/24/2018 09/03/2018 07/04/2017 10/06/2015   Decreased Interest 0 1 0 0   Down, Depressed, Hopeless 0 1 0 0   PHQ - 2 Score 0 2 0 0   Altered sleeping 0 1 - -   Tired, decreased energy 0 1 - -   Change in appetite 0 1 - -   Feeling bad or failure about yourself  0 0 - -   Trouble concentrating 0 0 - -   Moving slowly or fidgety/restless 0 0 - -   Suicidal thoughts 0 0 - -   PHQ-9 Score 0 5 - -   Difficult doing work/chores Not difficult at all Not difficult at all - -     Interpretation of Total Score  Total Score Depression Severity:  1-4 = Minimal depression, 5-9 = Mild depression, 10-14 = Moderate depression, 15-19 = Moderately severe depression, 20-27 = Severe depression   Psychosocial Evaluation and Intervention:   Psychosocial Re-Evaluation:   Psychosocial Discharge (Final Psychosocial Re-Evaluation):   Vocational Rehabilitation: Provide vocational rehab assistance to qualifying candidates.   Vocational Rehab Evaluation & Intervention: Vocational Rehab - 10/24/18 1352      Initial Vocational Rehab Evaluation & Intervention  Assessment shows need for Vocational Rehabilitation  No       Education: Education Goals: Education classes will be provided on a variety of topics geared toward better understanding of heart health and risk factor modification. Participant will state understanding/return demonstration of topics presented as noted by education test scores.  Learning Barriers/Preferences: Learning Barriers/Preferences - 10/24/18 1351      Learning Barriers/Preferences   Learning Barriers  None    Learning Preferences  None       Education Topics:  AED/CPR: - Group verbal and written instruction with the use of models to demonstrate the  basic use of the AED with the basic ABC's of resuscitation.   General Nutrition Guidelines/Fats and Fiber: -Group instruction provided by verbal, written material, models and posters to present the general guidelines for heart healthy nutrition. Gives an explanation and review of dietary fats and fiber.   Controlling Sodium/Reading Food Labels: -Group verbal and written material supporting the discussion of sodium use in heart healthy nutrition. Review and explanation with models, verbal and written materials for utilization of the food label.   Exercise Physiology & General Exercise Guidelines: - Group verbal and written instruction with models to review the exercise physiology of the cardiovascular system and associated critical values. Provides general exercise guidelines with specific guidelines to those with heart or lung disease.    Aerobic Exercise & Resistance Training: - Gives group verbal and written instruction on the various components of exercise. Focuses on aerobic and resistive training programs and the benefits of this training and how to safely progress through these programs..   Flexibility, Balance, Mind/Body Relaxation: Provides group verbal/written instruction on the benefits of flexibility and balance training, including mind/body exercise modes such as yoga, pilates and tai chi.  Demonstration and skill practice provided.   Stress and Anxiety: - Provides group verbal and written instruction about the health risks of elevated stress and causes of high stress.  Discuss the correlation between heart/lung disease and anxiety and treatment options. Review healthy ways to manage with stress and anxiety.   Depression: - Provides group verbal and written instruction on the correlation between heart/lung disease and depressed mood, treatment options, and the stigmas associated with seeking treatment.   Anatomy & Physiology of the Heart: - Group verbal and written  instruction and models provide basic cardiac anatomy and physiology, with the coronary electrical and arterial systems. Review of Valvular disease and Heart Failure   Cardiac Procedures: - Group verbal and written instruction to review commonly prescribed medications for heart disease. Reviews the medication, class of the drug, and side effects. Includes the steps to properly store meds and maintain the prescription regimen. (beta blockers and nitrates)   Cardiac Medications I: - Group verbal and written instruction to review commonly prescribed medications for heart disease. Reviews the medication, class of the drug, and side effects. Includes the steps to properly store meds and maintain the prescription regimen.   Cardiac Medications II: -Group verbal and written instruction to review commonly prescribed medications for heart disease. Reviews the medication, class of the drug, and side effects. (all other drug classes)    Go Sex-Intimacy & Heart Disease, Get SMART - Goal Setting: - Group verbal and written instruction through game format to discuss heart disease and the return to sexual intimacy. Provides group verbal and written material to discuss and apply goal setting through the application of the S.M.A.R.T. Method.   Other Matters of the Heart: - Provides group verbal, written materials and  models to describe Stable Angina and Peripheral Artery. Includes description of the disease process and treatment options available to the cardiac patient.   Exercise & Equipment Safety: - Individual verbal instruction and demonstration of equipment use and safety with use of the equipment.   Cardiac Rehab from 10/24/2018 in [facility] Cardiac and Pulmonary Rehab  Date  10/24/18  Educator  [facility]  Instruction Review Code  1- Verbalizes Understanding      Infection Prevention: - Provides verbal and written material to individual with discussion of infection control including proper hand washing and  proper equipment cleaning during exercise session.   Cardiac Rehab from 10/24/2018 in [facility] Cardiac and Pulmonary Rehab  Date  10/24/18  Educator  [facility]  Instruction Review Code  1- Verbalizes Understanding      Falls Prevention: - Provides verbal and written material to individual with discussion of falls prevention and safety.   Cardiac Rehab from 10/24/2018 in [facility] Cardiac and Pulmonary Rehab  Date  10/24/18  Educator  [facility]  Instruction Review Code  1- Verbalizes Understanding      Diabetes: - Individual verbal and written instruction to review signs/symptoms of diabetes, desired ranges of glucose level fasting, after meals and with exercise. Acknowledge that pre and post exercise glucose checks will be done for 3 sessions at entry of program.   Know Your Numbers and Risk Factors: -Group verbal and written instruction about important numbers in your health.  Discussion of what are risk factors and how they play a role in the disease process.  Review of Cholesterol, Blood Pressure, Diabetes, and BMI and the role they play in your overall health.   Sleep Hygiene: -Provides group verbal and written instruction about how sleep can affect your health.  Define sleep hygiene, discuss sleep cycles and impact of sleep habits. Review good sleep hygiene tips.    Other: -Provides group and verbal instruction on various topics (see comments)   Knowledge Questionnaire Score: Knowledge Questionnaire Score - 10/24/18 1351      Knowledge Questionnaire Score   Pre Score  23/26   reviewed correct responses with patient who verbalized understanding      Core Components/Risk Factors/Patient Goals at Admission: Personal Goals and Risk Factors at Admission - 10/24/18 1352      Core Components/Risk Factors/Patient Goals on Admission    Weight Management  Weight Loss;Obesity;Yes    Intervention  Weight Management: Develop a combined nutrition and exercise program designed to reach desired caloric  intake, while maintaining appropriate intake of nutrient and fiber, sodium and fats, and appropriate energy expenditure required for the weight goal.;Weight Management: Provide education and appropriate resources to help participant work on and attain dietary goals.;Weight Management/Obesity: Establish reasonable short term and long term weight goals.;Obesity: Provide education and appropriate resources to help participant work on and attain dietary goals.    Admit Weight  153 lb 8 oz (69.6 kg)    Goal Weight: Short Term  147 lb (66.7 kg)    Goal Weight: Long Term  140 lb (63.5 kg)    Expected Outcomes  Short Term: Continue to assess and modify interventions until short term weight is achieved;Long Term: Adherence to nutrition and physical activity/exercise program aimed toward attainment of established weight goal;Weight Loss: Understanding of general recommendations for a balanced deficit meal plan, which promotes 1-2 lb weight loss per week and includes a negative energy balance of [phone] kcal/d;Understanding recommendations for meals to include 15-35% energy as protein, 25-35% energy from fat, 35-60% energy from  carbohydrates, less than 279m of dietary cholesterol, 20-35 gm of total fiber daily;Understanding of distribution of calorie intake throughout the day with the consumption of 4-5 meals/snacks    Diabetes  Yes   goal A1C<6 so she can come off her DM medication.   Intervention  Provide education about signs/symptoms and action to take for hypo/hyperglycemia.;Provide education about proper nutrition, including hydration, and aerobic/resistive exercise prescription along with prescribed medications to achieve blood glucose in normal ranges: Fasting glucose 65-99 mg/dL    Expected Outcomes  Short Term: Participant verbalizes understanding of the signs/symptoms and immediate care of hyper/hypoglycemia, proper foot care and importance of medication, aerobic/resistive exercise and nutrition plan for  blood glucose control.;Long Term: Attainment of HbA1C < 7%.    Hypertension  Yes    Intervention  Provide education on lifestyle modifcations including regular physical activity/exercise, weight management, moderate sodium restriction and increased consumption of fresh fruit, vegetables, and low fat dairy, alcohol moderation, and smoking cessation.;Monitor prescription use compliance.    Expected Outcomes  Short Term: Continued assessment and intervention until BP is < 140/914mHG in hypertensive participants. < 130/8039mG in hypertensive participants with diabetes, heart failure or chronic kidney disease.;Long Term: Maintenance of blood pressure at goal levels.    Lipids  Yes    Intervention  Provide education and support for participant on nutrition & aerobic/resistive exercise along with prescribed medications to achieve LDL <49m23mDL >40mg36m Expected Outcomes  Short Term: Participant states understanding of desired cholesterol values and is compliant with medications prescribed. Participant is following exercise prescription and nutrition guidelines.;Long Term: Cholesterol controlled with medications as prescribed, with individualized exercise RX and with personalized nutrition plan. Value goals: LDL < 49mg,7m > 40 mg.    Stress  Yes    Intervention  Refer participants experiencing significant psychosocial distress to appropriate mental health specialists for further evaluation and treatment. When possible, include family members and significant others in education/counseling sessions.;Offer individual and/or small group education and counseling on adjustment to heart disease, stress management and health-related lifestyle change. Teach and support self-help strategies.    Expected Outcomes  Short Term: Participant demonstrates changes in health-related behavior, relaxation and other stress management skills, ability to obtain effective social support, and compliance with psychotropic medications  if prescribed.;Long Term: Emotional wellbeing is indicated by absence of clinically significant psychosocial distress or social isolation.    Personal Goal Other  Yes       Core Components/Risk Factors/Patient Goals Review:    Core Components/Risk Factors/Patient Goals at Discharge (Final Review):    ITP Comments: ITP Comments    Row Name 10/24/18 1324           ITP Comments  Medical Review Completed; initial ITP created. [facility]          Comments: Initial ITP

## 2018-10-24 NOTE — Patient Instructions (Signed)
Patient Instructions  Patient Details  Name: Olivia Roth MRN: [id number] Date of Birth: September 02, 1956 Referring Provider:  Yvonne Kendall, MD  Below are your personal goals for exercise, nutrition, and risk factors. Our goal is to help you stay on track towards obtaining and maintaining these goals. We will be discussing your progress on these goals with you throughout the program.  Initial Exercise Prescription: Initial Exercise Prescription - 10/24/18 1400      Date of Initial Exercise RX and Referring Provider   Date  10/24/18    Referring Provider  End      Treadmill   MPH  2.2    Grade  1    Minutes  15    METs  2.99      Recumbant Bike   Level  2    RPM  60    Minutes  15    METs  2.99      REL-XR   Level  3    Speed  50    Minutes  15    METs  2.99      Prescription Details   Frequency (times per week)  3    Duration  Progress to 30 minutes of continuous aerobic without signs/symptoms of physical distress      Intensity   THRR 40-80% of Max Heartrate  105-140    Ratings of Perceived Exertion  11-13    Perceived Dyspnea  0-4      Resistance Training   Training Prescription  Yes    Weight  3 lb    Reps  10-15       Exercise Goals: Frequency: Be able to perform aerobic exercise two to three times per week in program working toward 2-5 days per week of home exercise.  Intensity: Work with a perceived exertion of 11 (fairly light) - 15 (hard) while following your exercise prescription.  We will make changes to your prescription with you as you progress through the program.   Duration: Be able to do 30 to 45 minutes of continuous aerobic exercise in addition to a 5 minute warm-up and a 5 minute cool-down routine.   Nutrition Goals: Your personal nutrition goals will be established when you do your nutrition analysis with the dietician.  The following are general nutrition guidelines to follow: Cholesterol < 200mg /day Sodium < 1500mg /day Fiber: Women  over 50 yrs - 21 grams per day  Personal Goals: Personal Goals and Risk Factors at Admission - 10/24/18 1352      Core Components/Risk Factors/Patient Goals on Admission    Weight Management  Weight Loss;Obesity;Yes    Intervention  Weight Management: Develop a combined nutrition and exercise program designed to reach desired caloric intake, while maintaining appropriate intake of nutrient and fiber, sodium and fats, and appropriate energy expenditure required for the weight goal.;Weight Management: Provide education and appropriate resources to help participant work on and attain dietary goals.;Weight Management/Obesity: Establish reasonable short term and long term weight goals.;Obesity: Provide education and appropriate resources to help participant work on and attain dietary goals.    Admit Weight  153 lb 8 oz (69.6 kg)    Goal Weight: Short Term  147 lb (66.7 kg)    Goal Weight: Long Term  140 lb (63.5 kg)    Expected Outcomes  Short Term: Continue to assess and modify interventions until short term weight is achieved;Long Term: Adherence to nutrition and physical activity/exercise program aimed toward attainment of established weight goal;Weight Loss: Understanding  of general recommendations for a balanced deficit meal plan, which promotes 1-2 lb weight loss per week and includes a negative energy balance of [phone] kcal/d;Understanding recommendations for meals to include 15-35% energy as protein, 25-35% energy from fat, 35-60% energy from carbohydrates, less than 200mg  of dietary cholesterol, 20-35 gm of total fiber daily;Understanding of distribution of calorie intake throughout the day with the consumption of 4-5 meals/snacks    Diabetes  Yes   goal A1C<6 so she can come off her DM medication.   Intervention  Provide education about signs/symptoms and action to take for hypo/hyperglycemia.;Provide education about proper nutrition, including hydration, and aerobic/resistive exercise  prescription along with prescribed medications to achieve blood glucose in normal ranges: Fasting glucose 65-99 mg/dL    Expected Outcomes  Short Term: Participant verbalizes understanding of the signs/symptoms and immediate care of hyper/hypoglycemia, proper foot care and importance of medication, aerobic/resistive exercise and nutrition plan for blood glucose control.;Long Term: Attainment of HbA1C < 7%.    Hypertension  Yes    Intervention  Provide education on lifestyle modifcations including regular physical activity/exercise, weight management, moderate sodium restriction and increased consumption of fresh fruit, vegetables, and low fat dairy, alcohol moderation, and smoking cessation.;Monitor prescription use compliance.    Expected Outcomes  Short Term: Continued assessment and intervention until BP is < 140/2690mm HG in hypertensive participants. < 130/6780mm HG in hypertensive participants with diabetes, heart failure or chronic kidney disease.;Long Term: Maintenance of blood pressure at goal levels.    Lipids  Yes    Intervention  Provide education and support for participant on nutrition & aerobic/resistive exercise along with prescribed medications to achieve LDL 70mg , HDL >40mg .    Expected Outcomes  Short Term: Participant states understanding of desired cholesterol values and is compliant with medications prescribed. Participant is following exercise prescription and nutrition guidelines.;Long Term: Cholesterol controlled with medications as prescribed, with individualized exercise RX and with personalized nutrition plan. Value goals: LDL < 70mg , HDL > 40 mg.    Stress  Yes    Intervention  Refer participants experiencing significant psychosocial distress to appropriate mental health specialists for further evaluation and treatment. When possible, include family members and significant others in education/counseling sessions.;Offer individual and/or small group education and counseling on  adjustment to heart disease, stress management and health-related lifestyle change. Teach and support self-help strategies.    Expected Outcomes  Short Term: Participant demonstrates changes in health-related behavior, relaxation and other stress management skills, ability to obtain effective social support, and compliance with psychotropic medications if prescribed.;Long Term: Emotional wellbeing is indicated by absence of clinically significant psychosocial distress or social isolation.    Personal Goal Other  Yes       Tobacco Use Initial Evaluation: Social History   Tobacco Use  Smoking Status Never Smoker  Smokeless Tobacco Never Used    Exercise Goals and Review: Exercise Goals    Row Name 10/24/18 1359             Exercise Goals   Increase Physical Activity  Yes       Intervention  Provide advice, education, support and counseling about physical activity/exercise needs.;Develop an individualized exercise prescription for aerobic and resistive training based on initial evaluation findings, risk stratification, comorbidities and participant's personal goals.       Expected Outcomes  Short Term: Attend rehab on a regular basis to increase amount of physical activity.;Long Term: Add in home exercise to make exercise part of routine and to increase amount of  physical activity.;Long Term: Exercising regularly at least 3-5 days a week.       Increase Strength and Stamina  Yes       Intervention  Provide advice, education, support and counseling about physical activity/exercise needs.;Develop an individualized exercise prescription for aerobic and resistive training based on initial evaluation findings, risk stratification, comorbidities and participant's personal goals.       Expected Outcomes  Short Term: Increase workloads from initial exercise prescription for resistance, speed, and METs.;Short Term: Perform resistance training exercises routinely during rehab and add in resistance  training at home;Long Term: Improve cardiorespiratory fitness, muscular endurance and strength as measured by increased METs and functional capacity ( )       Able to understand and use rate of perceived exertion (RPE) scale  Yes       Intervention  Provide education and explanation on how to use RPE scale       Expected Outcomes  Short Term: Able to use RPE daily in rehab to express subjective intensity level;Long Term:  Able to use RPE to guide intensity level when exercising independently       Able to understand and use Dyspnea scale  Yes       Intervention  Provide education and explanation on how to use Dyspnea scale       Expected Outcomes  Short Term: Able to use Dyspnea scale daily in rehab to express subjective sense of shortness of breath during exertion;Long Term: Able to use Dyspnea scale to guide intensity level when exercising independently       Knowledge and understanding of Target Heart Rate Range (THRR)  Yes       Intervention  Provide education and explanation of THRR including how the numbers were predicted and where they are located for reference       Expected Outcomes  Short Term: Able to state/look up THRR;Short Term: Able to use daily as guideline for intensity in rehab;Long Term: Able to use THRR to govern intensity when exercising independently       Able to check pulse independently  Yes       Intervention  Provide education and demonstration on how to check pulse in carotid and radial arteries.;Review the importance of being able to check your own pulse for safety during independent exercise       Expected Outcomes  Short Term: Able to explain why pulse checking is important during independent exercise;Long Term: Able to check pulse independently and accurately       Understanding of Exercise Prescription  Yes       Intervention  Provide education, explanation, and written materials on patient's individual exercise prescription       Expected Outcomes  Short Term: Able  to explain program exercise prescription;Long Term: Able to explain home exercise prescription to exercise independently          Copy of goals given to participant.

## 2018-10-29 ENCOUNTER — Telehealth: Payer: Self-pay

## 2018-10-29 DIAGNOSIS — Z951 Presence of aortocoronary bypass graft: Secondary | ICD-10-CM | POA: Diagnosis not present

## 2018-10-29 LAB — GLUCOSE, CAPILLARY
Glucose-Capillary: 116 mg/dL — ABNORMAL HIGH (ref 70–99)
Glucose-Capillary: 59 mg/dL — ABNORMAL LOW (ref 70–99)
Glucose-Capillary: 62 mg/dL — ABNORMAL LOW (ref 70–99)
Glucose-Capillary: 69 mg/dL — ABNORMAL LOW (ref 70–99)
Glucose-Capillary: 79 mg/dL (ref 70–99)

## 2018-10-29 NOTE — Progress Notes (Signed)
Daily Session Note  Patient Details  Name: Olivia Roth MRN: [id number] Date of Birth: January 20, 1956 Referring Provider:     Cardiac Rehab from 10/24/2018 in [facility] Cardiac and Pulmonary Rehab  Referring Provider  End      Encounter Date: 10/29/2018  Check In: Session Check In - 10/29/18 0955      Check-In   Supervising physician immediately available to respond to emergencies  See telemetry face sheet for immediately available ER MD    Location  [facility]-Cardiac & Pulmonary Rehab    Staff Present  Heath Lark, RN, BSN, CCRP;Garv Kuechle BS, Exercise Physiologist;Jessica [place], MA, RCEP, CCRP, Exercise Physiologist    Medication changes reported      No    Fall or balance concerns reported     No    Tobacco Cessation  No Change    Warm-up and Cool-down  Performed as group-led instruction    Resistance Training Performed  Yes    VAD Patient?  No    PAD/SET Patient?  No      Pain Assessment   Currently in Pain?  No/denies    Multiple Pain Sites  No          Social History   Tobacco Use  Smoking Status Never Smoker  Smokeless Tobacco Never Used    Goals Met:  Exercise tolerated well Personal goals reviewed No report of cardiac concerns or symptoms Strength training completed today  Goals Unmet:  Not Applicable  Comments: First full day of exercise!  Patient was oriented to gym and equipment including functions, settings, policies, and procedures.  Patient's individual exercise prescription and treatment plan were reviewed.  All starting workloads were established based on the results of the 6 minute walk test done at initial orientation visit.  The plan for exercise progression was also introduced and progression will be customized based on patient's performance and goals.  After exercise, Olivia Roth's blood sugar dropped to 62, then to 59 after 4 glucose tabs.  She was then given 3 peppermints for carb source.   She was then up to 69.  She ate crackers and an apple and was  able to recover to 79.  She was planning to call her doctor today about her sugars.  We will continue to monitor her blood sugars.    Dr. Emily Filbert is Medical Director for [place] and [facility].

## 2018-10-29 NOTE — Telephone Encounter (Signed)
Patient was having cardiac rehab today had some exercise like 15 minute weight lift, 15 minute bicycle and 20 minute trade mill and her sugar drop to 62 mg/dl and they gave her 4 glucose tablet and it even dropped to 59 and 3 peppermint were given and was up to 69 and after half an hour it was 79 mg/dl. As per patient had some lunch and now back to 85/87. Does she needs to do any medication change please suggest ?

## 2018-10-29 NOTE — Telephone Encounter (Signed)
Patient advised.

## 2018-10-29 NOTE — Telephone Encounter (Signed)
Covering inbox for Olivia Roth, AGPCNP-BC while she is out of office.  Patient was last seen 10/21/18 for diabetes follow-up, I reviewed her note and med list.  I would recommend that patient HOLD Glimepiride 2mg  daily - and NOT take this for now until advised to restart it.  She can continue Januvia and Metformin for now.  She should continue to monitor blood sugar.  The glimepiride could have made her sugars drop too far with improved lifestyle and exercise.  I will forward this to Lauren for review, and she may discuss more at next appointment in February, but likely she should HOLD Glimepiride until returns in February.  Saralyn Pilar, DO [facility] 10/29/2018, 2:23 PM

## 2018-10-31 DIAGNOSIS — Z951 Presence of aortocoronary bypass graft: Secondary | ICD-10-CM | POA: Diagnosis not present

## 2018-10-31 LAB — GLUCOSE, CAPILLARY
Glucose-Capillary: 151 mg/dL — ABNORMAL HIGH (ref 70–99)
Glucose-Capillary: 95 mg/dL (ref 70–99)

## 2018-10-31 NOTE — Progress Notes (Signed)
Daily Session Note  Patient Details  Name: Olivia Roth MRN: [id number] Date of Birth: 07/06/1956 Referring Provider:     Cardiac Rehab from 10/24/2018 in [facility] Cardiac and Pulmonary Rehab  Referring Provider  End      Encounter Date: 10/31/2018  Check In: Session Check In - 10/31/18 0233      Check-In   Supervising physician immediately available to respond to emergencies  See telemetry face sheet for immediately available ER MD    Location  [facility]-Cardiac & Pulmonary Rehab    Staff Present  Gerlene Burdock, RN, BSN;Jessica Luan Pulling, MA, RCEP, CCRP, Exercise Physiologist;Nneoma Harral Tessie Fass RCP,RRT,BSRT    Medication changes reported      No    Fall or balance concerns reported     No    Warm-up and Cool-down  Performed as group-led instruction    Resistance Training Performed  Yes    VAD Patient?  No    PAD/SET Patient?  No      Pain Assessment   Currently in Pain?  No/denies          Social History   Tobacco Use  Smoking Status Never Smoker  Smokeless Tobacco Never Used    Goals Met:  Independence with exercise equipment Exercise tolerated well No report of cardiac concerns or symptoms Strength training completed today  Goals Unmet:  Not Applicable  Comments: Pt able to follow exercise prescription today without complaint.  Will continue to monitor for progression.    Dr. Emily Filbert is Medical Director for [place] and [facility].

## 2018-11-05 DIAGNOSIS — Z951 Presence of aortocoronary bypass graft: Secondary | ICD-10-CM

## 2018-11-05 LAB — GLUCOSE, CAPILLARY
Glucose-Capillary: 132 mg/dL — ABNORMAL HIGH (ref 70–99)
Glucose-Capillary: 109 mg/dL — ABNORMAL HIGH (ref 70–99)

## 2018-11-05 NOTE — Progress Notes (Signed)
Daily Session Note  Patient Details  Name: Olivia Roth MRN: [id number] Date of Birth: [birth year] Referring Provider:     Cardiac Rehab from 10/24/2018 in [facility] Cardiac and Pulmonary Rehab  Referring Provider  End      Encounter Date: 11/05/2018  Check In: Session Check In - 11/05/18 0955      Check-In   Supervising physician immediately available to respond to emergencies  See telemetry face sheet for immediately available ER MD    Location  [facility]-Cardiac & Pulmonary Rehab    Staff Present  Darel Hong, RN BSN;Shaneequa Bahner BS, Exercise Physiologist;Jessica Luan Pulling, MA, RCEP, CCRP, Exercise Physiologist;Amanda Oletta Darter, BA, ACSM CEP, Exercise Physiologist    Medication changes reported      No    Fall or balance concerns reported     No    Tobacco Cessation  No Change    Warm-up and Cool-down  Performed as group-led instruction    Resistance Training Performed  Yes    VAD Patient?  No    PAD/SET Patient?  No      Pain Assessment   Currently in Pain?  No/denies    Multiple Pain Sites  No          Social History   Tobacco Use  Smoking Status Never Smoker  Smokeless Tobacco Never Used    Goals Met:  Independence with exercise equipment Exercise tolerated well No report of cardiac concerns or symptoms Strength training completed today  Goals Unmet:  Not Applicable  Comments: Pt able to follow exercise prescription today without complaint.  Will continue to monitor for progression.    Dr. Emily Filbert is Medical Director for [place] and [facility].

## 2018-11-06 DIAGNOSIS — Z951 Presence of aortocoronary bypass graft: Secondary | ICD-10-CM

## 2018-11-06 NOTE — Progress Notes (Signed)
Cardiac Individual Treatment Plan  Patient Details  Name: Olivia Roth MRN: [id number] Date of Birth: 02/22/1956 Referring Provider:     Cardiac Rehab from 10/24/2018 in [facility] Cardiac and Pulmonary Rehab  Referring Provider  End      Initial Encounter Date:    Cardiac Rehab from 10/24/2018 in [facility] Cardiac and Pulmonary Rehab  Date  10/24/18      Visit Diagnosis: S/P CABG x 4  Patient's Home Medications on Admission:  Current Outpatient Medications:  .  acetaminophen (TYLENOL) 500 MG tablet, Take 2 tablets (1,000 mg total) by mouth every 6 (six) hours as needed., Disp: 30 tablet, Rfl: 0 .  albuterol (PROVENTIL HFA;VENTOLIN HFA) 108 (90 Base) MCG/ACT inhaler, Inhale 2 puffs into the lungs every 6 (six) hours as needed for wheezing or shortness of breath., Disp: 1 Inhaler, Rfl: 11 .  aspirin EC 81 MG tablet, Take 1 tablet (81 mg total) by mouth daily., Disp: 90 tablet, Rfl: 3 .  atorvastatin (LIPITOR) 80 MG tablet, Take 1 tablet (80 mg total) by mouth daily at 6 PM., Disp: 90 tablet, Rfl: 1 .  blood glucose meter kit and supplies KIT, Dispense based on patient and insurance preference. two times daily as directed. For E11.65, Disp: 1 each, Rfl: 0 .  clopidogrel (PLAVIX) 75 MG tablet, Take 1 tablet (75 mg total) by mouth daily., Disp: 90 tablet, Rfl: 0 .  glimepiride (AMARYL) 2 MG tablet, Take 1 tablet (2 mg total) by mouth daily before breakfast., Disp: 90 tablet, Rfl: 1 .  glucose blood test strip, Use as instructed, Disp: 100 each, Rfl: 12 .  Lancets Misc. KIT, 100 each by Does not apply route 2 (two) times daily., Disp: 100 each, Rfl: 3 .  metFORMIN (GLUCOPHAGE) 500 MG tablet, Take 2 tablets (1,000 mg total) by mouth 2 (two) times daily with a meal. (Patient taking differently: Take 500 mg by mouth as directed. 1000 MG in the morning  And 500 MG at bedtime), Disp: 360 tablet, Rfl: 1 .  metoprolol tartrate (LOPRESSOR) 25 MG tablet, Take 1 tablet (25 mg total) by mouth 2 (two) times  daily., Disp: 180 tablet, Rfl: 1 .  sitaGLIPtin (JANUVIA) 50 MG tablet, Take 1 tablet (50 mg total) by mouth daily., Disp: 90 tablet, Rfl: 1  Past Medical History: Past Medical History:  Diagnosis Date  . Anxiety   . Asthma    In cold weather (08/21/2018)  . CAD (coronary artery disease)    a. 08/2018 NSTEMI/Cath: LM 50d,LAD 80ost/p, 31m70d, LCX 90ost, OM1 50, OM3 70, OM4 80, LPDA small, RCA  99p/d, EF 30-35%; c. 08/2018 CABG x 4: LIMA->LAD, VG->RI, VG->OM, VG->RCA.  .Marland KitchenChronic combined systolic (congestive) and diastolic (congestive) heart failure ([place]    a. 08/2018 Echo: EF 40-45%, mid-apicalanteroseptal and apical HK. Gr2 DD. Mild AI; b. 08/2018 intraop TEE: EF 45-50%, antsept, infsept HK. Mild AI. Mildly dil Ao root. Trace MR.  . Family history of adverse reaction to anesthesia    "mother PONV; talked crazy/loud after anesthesia" (08/21/2018)  . GERD (gastroesophageal reflux disease)   . High cholesterol   . Ischemic cardiomyopathy    a. 08/2018 Echo: EF 40-45%; b. 08/2018 TEE: EF 45-50%.  . Seasonal allergies   . Tuberculosis ~ 1960  . Type II diabetes mellitus ([place]   . UTI (lower urinary tract infection)    "once; before hysterectomy" (08/21/2018)    Tobacco Use: Social History   Tobacco Use  Smoking Status Never Smoker  Smokeless Tobacco Never Used    Labs: Recent Review Flowsheet Data    Labs for ITP Cardiac and Pulmonary Rehab Latest Ref Rng & Units 08/26/2018 08/27/2018 08/28/2018 08/29/2018 10/04/2018   Cholestrol 100 - 199 mg/dL - - - - 120   LDLCALC 0 - 99 mg/dL - - - - 50   HDL >39 mg/dL - - - - 33(L)   Trlycerides 0 - 149 mg/dL - - - - 186(H)   Hemoglobin A1c 4.8 - 5.6 % - - - - -   PHART 7.350 - 7.450 - - - - -   PCO2ART 32.0 - 48.0 mmHg - - - - -   HCO3 20.0 - 28.0 mmol/L - - - - -   TCO2 22 - 32 mmol/L 24 20(L) - - -   ACIDBASEDEF 0.0 - 2.0 mmol/L - - - - -   O2SAT % - 54.5 62.2 62.5 -       Exercise Target Goals: Exercise Program  Goal: Individual exercise prescription set using results from initial 6 min walk test and THRR while considering  patient's activity barriers and safety.   Exercise Prescription Goal: Initial exercise prescription builds to 30-45 minutes a day of aerobic activity, 2-3 days per week.  Home exercise guidelines will be given to patient during program as part of exercise prescription that the participant will acknowledge.  Activity Barriers & Risk Stratification: Activity Barriers & Cardiac Risk Stratification - 10/24/18 1339      Activity Barriers & Cardiac Risk Stratification   Activity Barriers  None    Cardiac Risk Stratification  High       6 Minute Walk: 6 Minute Walk    Row Name 10/24/18 1359         6 Minute Walk   Phase  Initial     Distance  1300 feet     Walk Time  6 minutes     # of Rest Breaks  0     MPH  2.46     METS  2.99     RPE  11     Perceived Dyspnea   2     VO2 Peak  10.49     Symptoms  No     Resting HR  71 bpm     Resting BP  118/70     Resting Oxygen Saturation   98 %     Exercise Oxygen Saturation  during 6 min walk  96 %     Max Ex. HR  95 bpm     Max Ex. BP  118/68     2 Minute Post BP  108/72        Oxygen Initial Assessment:   Oxygen Re-Evaluation:   Oxygen Discharge (Final Oxygen Re-Evaluation):   Initial Exercise Prescription: Initial Exercise Prescription - 10/24/18 1400      Date of Initial Exercise RX and Referring Provider   Date  10/24/18    Referring Provider  End      Treadmill   MPH  2.2    Grade  1    Minutes  15    METs  2.99      Recumbant Bike   Level  2    RPM  60    Minutes  15    METs  2.99      REL-XR   Level  3    Speed  50    Minutes  15    METs  2.99  Prescription Details   Frequency (times per week)  3    Duration  Progress to 30 minutes of continuous aerobic without signs/symptoms of physical distress      Intensity   THRR 40-80% of Max Heartrate  105-140    Ratings of Perceived  Exertion  11-13    Perceived Dyspnea  0-4      Resistance Training   Training Prescription  Yes    Weight  3 lb    Reps  10-15       Perform Capillary Blood Glucose checks as needed.  Exercise Prescription Changes: Exercise Prescription Changes    Row Name 10/24/18 1400 10/29/18 1400           Response to Exercise   Blood Pressure (Admit)  118/70  128/64      Blood Pressure (Exercise)  118/60  154/70      Blood Pressure (Exit)  108/72  124/90      Heart Rate (Admit)  73 bpm  74 bpm      Heart Rate (Exercise)  95 bpm  112 bpm      Heart Rate (Exit)  68 bpm  90 bpm      Oxygen Saturation (Admit)  98 %  -      Oxygen Saturation (Exit)  96 %  -      Rating of Perceived Exertion (Exercise)  11  15      Perceived Dyspnea (Exercise)  2  -      Symptoms  -  none      Comments  -  first full day of exercise      Duration  -  Progress to 30 minutes of  aerobic without signs/symptoms of physical distress      Intensity  -  THRR unchanged        Progression   Progression  -  Continue to progress workloads to maintain intensity without signs/symptoms of physical distress.      Average METs  -  2.54        Resistance Training   Training Prescription  -  Yes      Weight  -  3 lbs      Reps  -  10-15        Interval Training   Interval Training  -  No        Treadmill   MPH  -  2.2      Grade  -  1      Minutes  -  15      METs  -  2.99        Recumbant Bike   Level  -  2      Minutes  -  15      METs  -  2.1         Exercise Comments: Exercise Comments    Row Name 10/29/18 1127           Exercise Comments  First full day of exercise!  Patient was oriented to gym and equipment including functions, settings, policies, and procedures.  Patient's individual exercise prescription and treatment plan were reviewed.  All starting workloads were established based on the results of the 6 minute walk test done at initial orientation visit.  The plan for exercise progression  was also introduced and progression will be customized based on patient's performance and goals.          Exercise Goals and Review: Exercise  Goals    Row Name 10/24/18 1359             Exercise Goals   Increase Physical Activity  Yes       Intervention  Provide advice, education, support and counseling about physical activity/exercise needs.;Develop an individualized exercise prescription for aerobic and resistive training based on initial evaluation findings, risk stratification, comorbidities and participant's personal goals.       Expected Outcomes  Short Term: Attend rehab on a regular basis to increase amount of physical activity.;Long Term: Add in home exercise to make exercise part of routine and to increase amount of physical activity.;Long Term: Exercising regularly at least 3-5 days a week.       Increase Strength and Stamina  Yes       Intervention  Provide advice, education, support and counseling about physical activity/exercise needs.;Develop an individualized exercise prescription for aerobic and resistive training based on initial evaluation findings, risk stratification, comorbidities and participant's personal goals.       Expected Outcomes  Short Term: Increase workloads from initial exercise prescription for resistance, speed, and METs.;Short Term: Perform resistance training exercises routinely during rehab and add in resistance training at home;Long Term: Improve cardiorespiratory fitness, muscular endurance and strength as measured by increased METs and functional capacity (6MWT)       Able to understand and use rate of perceived exertion (RPE) scale  Yes       Intervention  Provide education and explanation on how to use RPE scale       Expected Outcomes  Short Term: Able to use RPE daily in rehab to express subjective intensity level;Long Term:  Able to use RPE to guide intensity level when exercising independently       Able to understand and use Dyspnea scale  Yes        Intervention  Provide education and explanation on how to use Dyspnea scale       Expected Outcomes  Short Term: Able to use Dyspnea scale daily in rehab to express subjective sense of shortness of breath during exertion;Long Term: Able to use Dyspnea scale to guide intensity level when exercising independently       Knowledge and understanding of Target Heart Rate Range (THRR)  Yes       Intervention  Provide education and explanation of THRR including how the numbers were predicted and where they are located for reference       Expected Outcomes  Short Term: Able to state/look up THRR;Short Term: Able to use daily as guideline for intensity in rehab;Long Term: Able to use THRR to govern intensity when exercising independently       Able to check pulse independently  Yes       Intervention  Provide education and demonstration on how to check pulse in carotid and radial arteries.;Review the importance of being able to check your own pulse for safety during independent exercise       Expected Outcomes  Short Term: Able to explain why pulse checking is important during independent exercise;Long Term: Able to check pulse independently and accurately       Understanding of Exercise Prescription  Yes       Intervention  Provide education, explanation, and written materials on patient's individual exercise prescription       Expected Outcomes  Short Term: Able to explain program exercise prescription;Long Term: Able to explain home exercise prescription to exercise independently  Exercise Goals Re-Evaluation : Exercise Goals Re-Evaluation    [place] Name 10/29/18 0958             Exercise Goal Re-Evaluation   Exercise Goals Review  Increase Physical Activity;Increase Strength and Stamina;Able to understand and use rate of perceived exertion (RPE) scale;Knowledge and understanding of Target Heart Rate Range (THRR);Understanding of Exercise Prescription       Comments  Reviewed RPE scale, THR  and program prescription with pt today.  Pt voiced understanding and was given a copy of goals to take home.        Expected Outcomes  Short: Use RPE daily to regulate intensity. Long: Follow program prescription in THR.          Discharge Exercise Prescription (Final Exercise Prescription Changes): Exercise Prescription Changes - 10/29/18 1400      Response to Exercise   Blood Pressure (Admit)  128/64    Blood Pressure (Exercise)  154/70    Blood Pressure (Exit)  124/90    Heart Rate (Admit)  74 bpm    Heart Rate (Exercise)  112 bpm    Heart Rate (Exit)  90 bpm    Rating of Perceived Exertion (Exercise)  15    Symptoms  none    Comments  first full day of exercise    Duration  Progress to 30 minutes of  aerobic without signs/symptoms of physical distress    Intensity  THRR unchanged      Progression   Progression  Continue to progress workloads to maintain intensity without signs/symptoms of physical distress.    Average METs  2.54      Resistance Training   Training Prescription  Yes    Weight  3 lbs    Reps  10-15      Interval Training   Interval Training  No      Treadmill   MPH  2.2    Grade  1    Minutes  15    METs  2.99      Recumbant Bike   Level  2    Minutes  15    METs  2.1       Nutrition:  Target Goals: Understanding of nutrition guidelines, daily intake of sodium <153m, cholesterol <2051m calories 30% from fat and 7% or less from saturated fats, daily to have 5 or more servings of fruits and vegetables.  Biometrics: Pre Biometrics - 10/24/18 1358      Pre Biometrics   Height  5' 1.75" (1.568 m)    Weight  153 lb 8 oz (69.6 kg)    Waist Circumference  33 inches    Hip Circumference  39 inches    Waist to Hip Ratio  0.85 %    BMI (Calculated)  28.32    Single Leg Stand  15.72 seconds        Nutrition Therapy Plan and Nutrition Goals: Nutrition Therapy & Goals - 10/24/18 1350      Intervention Plan   Intervention  Prescribe, educate  and counsel regarding individualized specific dietary modifications aiming towards targeted core components such as weight, hypertension, lipid management, diabetes, heart failure and other comorbidities.;Nutrition handout(s) given to patient.    Expected Outcomes  Short Term Goal: Understand basic principles of dietary content, such as calories, fat, sodium, cholesterol and nutrients.;Long Term Goal: Adherence to prescribed nutrition plan.;Short Term Goal: A plan has been developed with personal nutrition goals set during dietitian appointment.  Nutrition Assessments: Nutrition Assessments - 10/24/18 1350      MEDFICTS Scores   Pre Score  0   her diet has changed/improved greatly since her CABG      Nutrition Goals Re-Evaluation:   Nutrition Goals Discharge (Final Nutrition Goals Re-Evaluation):   Psychosocial: Target Goals: Acknowledge presence or absence of significant depression and/or stress, maximize coping skills, provide positive support system. Participant is able to verbalize types and ability to use techniques and skills needed for reducing stress and depression.   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      Family Dynamics   Good Support System?  Yes    Comments  family, husband      Barriers   Psychosocial barriers to participate in program  The patient should benefit from training in stress management and relaxation.      Screening Interventions   Interventions  Encouraged to exercise;Program counselor consult;To provide support and resources with identified psychosocial needs;Provide feedback about the scores to participant       Quality of Life Scores:  Quality of Life -  10/24/18 1349      Quality of Life   Select  Quality of Life      Quality of Life Scores   Health/Function Pre  28.15 %    Socioeconomic Pre  30 %    Psych/Spiritual Pre  29.64 %    Family Pre  30 %    GLOBAL Pre  29.17 %      Scores of 19 and below usually indicate a poorer quality of life in these areas.  A difference of  2-3 points is a clinically meaningful difference.  A difference of 2-3 points in the total score of the Quality of Life Index has been associated with significant improvement in overall quality of life, self-image, physical symptoms, and general health in studies assessing change in quality of life.  PHQ-9: Recent Review Flowsheet Data    Depression screen [facility] 2/9 10/24/2018 09/03/2018 07/04/2017 10/06/2015   Decreased Interest 0 1 0 0   Down, Depressed, Hopeless 0 1 0 0   PHQ - 2 Score 0 2 0 0   Altered sleeping 0 1 - -   Tired, decreased energy 0 1 - -   Change in appetite 0 1 - -   Feeling bad or failure about yourself  0 0 - -   Trouble concentrating 0 0 - -   Moving slowly or fidgety/restless 0 0 - -   Suicidal thoughts 0 0 - -   PHQ-9 Score 0 5 - -   Difficult doing work/chores Not difficult at all Not difficult at all - -     Interpretation of Total Score  Total Score Depression Severity:  1-4 = Minimal depression, 5-9 = Mild depression, 10-14 = Moderate depression, 15-19 = Moderately severe depression, 20-27 = Severe depression   Psychosocial Evaluation and Intervention: Psychosocial Evaluation - 10/29/18 1027      Psychosocial Evaluation & Interventions   Interventions  Encouraged to exercise with the program and follow exercise prescription;Stress management education    Comments  Counselor met with Ms. Overdorf Verdis Frederickson) today for initial psychsocial  evaluation.  She is a 62 year old who had a CABGx4 in November of 2019.  Sweetie has a strong support system with a spouse; (2) children; a host of friends and coworkers and active involvement in her local  church.  She sleeps well and has a great appetite.  She has lost 15-20 pounds since her procedure and is hoping to decrease her medications for diabetes if she continues to lose weight.  She reports a history of anxiety 10-15 years ago and some minor symptoms currently but no symptoms of depression.  Muslima is typically in a positive mood.  Her stress currently is "finding her new normal" and going back to work in February.  She has goals to lose more weight and just generally be healthier.  Staff will follow with Verdis Frederickson throughout the course of this program.     Expected Outcomes  Short:  Makira will meet with the dietician to address her weight loss goal.  She will also consistently exercise to build her stamina and strength and develop a routine of healthy lifestyle.  Long:  Jesus will practice a healthier lifestyle and find her new norm.    Continue Psychosocial Services   Follow up required by staff       Psychosocial Re-Evaluation:   Psychosocial Discharge (Final Psychosocial Re-Evaluation):   Vocational Rehabilitation: Provide vocational rehab assistance to qualifying candidates.   Vocational Rehab Evaluation & Intervention: Vocational Rehab - 10/24/18 1352      Initial Vocational Rehab Evaluation & Intervention   Assessment shows need for Vocational Rehabilitation  No       Education: Education Goals: Education classes will be provided on a variety of topics geared toward better understanding of heart health and risk factor modification. Participant will state understanding/return demonstration of topics presented as noted by education test scores.  Learning Barriers/Preferences: Learning Barriers/Preferences - 10/24/18 1351      Learning Barriers/Preferences   Learning Barriers  None    Learning Preferences  None       Education Topics:  AED/CPR: - Group verbal and written instruction with the use of models to demonstrate the basic use of the AED with the basic ABC's of  resuscitation.   General Nutrition Guidelines/Fats and Fiber: -Group instruction provided by verbal, written material, models and posters to present the general guidelines for heart healthy nutrition. Gives an explanation and review of dietary fats and fiber.   Controlling Sodium/Reading Food Labels: -Group verbal and written material supporting the discussion of sodium use in heart healthy nutrition. Review and explanation with models, verbal and written materials for utilization of the food label.   Exercise Physiology & General Exercise Guidelines: - Group verbal and written instruction with models to review the exercise physiology of the cardiovascular system and associated critical values. Provides general exercise guidelines with specific guidelines to those with heart or lung disease.    Cardiac Rehab from 11/05/2018 in [facility] Cardiac and Pulmonary Rehab  Date  10/29/18  Educator  [facility]  Instruction Review Code  1- Verbalizes Understanding      Aerobic Exercise & Resistance Training: - Gives group verbal and written instruction on the various components of exercise. Focuses on aerobic and resistive training programs and the benefits of this training and how to safely progress through these programs..   Cardiac Rehab from 11/05/2018 in [facility] Cardiac and Pulmonary Rehab  Date  10/31/18  Educator  [place]  Instruction Review Code  1- Geologist, engineering,  Balance, Mind/Body Relaxation: Provides group verbal/written instruction on the benefits of flexibility and balance training, including mind/body exercise modes such as yoga, pilates and tai chi.  Demonstration and skill practice provided.   Cardiac Rehab from 11/05/2018 in [facility] Cardiac and Pulmonary Rehab  Date  11/05/18  Educator  [facility]  Instruction Review Code  1- Verbalizes Understanding      Stress and Anxiety: - Provides group verbal and written instruction about the health risks of elevated stress and causes of  high stress.  Discuss the correlation between heart/lung disease and anxiety and treatment options. Review healthy ways to manage with stress and anxiety.   Depression: - Provides group verbal and written instruction on the correlation between heart/lung disease and depressed mood, treatment options, and the stigmas associated with seeking treatment.   Anatomy & Physiology of the Heart: - Group verbal and written instruction and models provide basic cardiac anatomy and physiology, with the coronary electrical and arterial systems. Review of Valvular disease and Heart Failure   Cardiac Procedures: - Group verbal and written instruction to review commonly prescribed medications for heart disease. Reviews the medication, class of the drug, and side effects. Includes the steps to properly store meds and maintain the prescription regimen. (beta blockers and nitrates)   Cardiac Medications I: - Group verbal and written instruction to review commonly prescribed medications for heart disease. Reviews the medication, class of the drug, and side effects. Includes the steps to properly store meds and maintain the prescription regimen.   Cardiac Medications II: -Group verbal and written instruction to review commonly prescribed medications for heart disease. Reviews the medication, class of the drug, and side effects. (all other drug classes)    Go Sex-Intimacy & Heart Disease, Get SMART - Goal Setting: - Group verbal and written instruction through game format to discuss heart disease and the return to sexual intimacy. Provides group verbal and written material to discuss and apply goal setting through the application of the S.M.A.R.T. Method.   Other Matters of the Heart: - Provides group verbal, written materials and models to describe Stable Angina and Peripheral Artery. Includes description of the disease process and treatment options available to the cardiac patient.   Exercise & Equipment  Safety: - Individual verbal instruction and demonstration of equipment use and safety with use of the equipment.   Cardiac Rehab from 11/05/2018 in [facility] Cardiac and Pulmonary Rehab  Date  10/24/18  Educator  [facility]  Instruction Review Code  1- Verbalizes Understanding      Infection Prevention: - Provides verbal and written material to individual with discussion of infection control including proper hand washing and proper equipment cleaning during exercise session.   Cardiac Rehab from 11/05/2018 in [facility] Cardiac and Pulmonary Rehab  Date  10/24/18  Educator  [facility]  Instruction Review Code  1- Verbalizes Understanding      Falls Prevention: - Provides verbal and written material to individual with discussion of falls prevention and safety.   Cardiac Rehab from 11/05/2018 in [facility] Cardiac and Pulmonary Rehab  Date  10/24/18  Educator  [facility]  Instruction Review Code  1- Verbalizes Understanding      Diabetes: - Individual verbal and written instruction to review signs/symptoms of diabetes, desired ranges of glucose level fasting, after meals and with exercise. Acknowledge that pre and post exercise glucose checks will be done for 3 sessions at entry of program.   Know Your Numbers and Risk Factors: -Group verbal and written instruction about important numbers in  your health.  Discussion of what are risk factors and how they play a role in the disease process.  Review of Cholesterol, Blood Pressure, Diabetes, and BMI and the role they play in your overall health.   Sleep Hygiene: -Provides group verbal and written instruction about how sleep can affect your health.  Define sleep hygiene, discuss sleep cycles and impact of sleep habits. Review good sleep hygiene tips.    Other: -Provides group and verbal instruction on various topics (see comments)   Knowledge Questionnaire Score: Knowledge Questionnaire Score - 10/24/18 1351      Knowledge Questionnaire Score   Pre Score  23/26    reviewed correct responses with patient who verbalized understanding      Core Components/Risk Factors/Patient Goals at Admission: Personal Goals and Risk Factors at Admission - 10/24/18 1352      Core Components/Risk Factors/Patient Goals on Admission    Weight Management  Weight Loss;Obesity;Yes    Intervention  Weight Management: Develop a combined nutrition and exercise program designed to reach desired caloric intake, while maintaining appropriate intake of nutrient and fiber, sodium and fats, and appropriate energy expenditure required for the weight goal.;Weight Management: Provide education and appropriate resources to help participant work on and attain dietary goals.;Weight Management/Obesity: Establish reasonable short term and long term weight goals.;Obesity: Provide education and appropriate resources to help participant work on and attain dietary goals.    Admit Weight  153 lb 8 oz (69.6 kg)    Goal Weight: Short Term  147 lb (66.7 kg)    Goal Weight: Long Term  140 lb (63.5 kg)    Expected Outcomes  Short Term: Continue to assess and modify interventions until short term weight is achieved;Long Term: Adherence to nutrition and physical activity/exercise program aimed toward attainment of established weight goal;Weight Loss: Understanding of general recommendations for a balanced deficit meal plan, which promotes 1-2 lb weight loss per week and includes a negative energy balance of [phone] kcal/d;Understanding recommendations for meals to include 15-35% energy as protein, 25-35% energy from fat, 35-60% energy from carbohydrates, less than 22m of dietary cholesterol, 20-35 gm of total fiber daily;Understanding of distribution of calorie intake throughout the day with the consumption of 4-5 meals/snacks    Diabetes  Yes   goal A1C<6 so she can come off her DM medication.   Intervention  Provide education about signs/symptoms and action to take for hypo/hyperglycemia.;Provide  education about proper nutrition, including hydration, and aerobic/resistive exercise prescription along with prescribed medications to achieve blood glucose in normal ranges: Fasting glucose 65-99 mg/dL    Expected Outcomes  Short Term: Participant verbalizes understanding of the signs/symptoms and immediate care of hyper/hypoglycemia, proper foot care and importance of medication, aerobic/resistive exercise and nutrition plan for blood glucose control.;Long Term: Attainment of HbA1C < 7%.    Hypertension  Yes    Intervention  Provide education on lifestyle modifcations including regular physical activity/exercise, weight management, moderate sodium restriction and increased consumption of fresh fruit, vegetables, and low fat dairy, alcohol moderation, and smoking cessation.;Monitor prescription use compliance.    Expected Outcomes  Short Term: Continued assessment and intervention until BP is < 140/971mHG in hypertensive participants. < 130/8062mG in hypertensive participants with diabetes, heart failure or chronic kidney disease.;Long Term: Maintenance of blood pressure at goal levels.    Lipids  Yes    Intervention  Provide education and support for participant on nutrition & aerobic/resistive exercise along with prescribed medications to achieve LDL <64m61mDL >40mg15m  Expected Outcomes  Short Term: Participant states understanding of desired cholesterol values and is compliant with medications prescribed. Participant is following exercise prescription and nutrition guidelines.;Long Term: Cholesterol controlled with medications as prescribed, with individualized exercise RX and with personalized nutrition plan. Value goals: LDL < 47m, HDL > 40 mg.    Stress  Yes    Intervention  Refer participants experiencing significant psychosocial distress to appropriate mental health specialists for further evaluation and treatment. When possible, include family members and significant others in  education/counseling sessions.;Offer individual and/or small group education and counseling on adjustment to heart disease, stress management and health-related lifestyle change. Teach and support self-help strategies.    Expected Outcomes  Short Term: Participant demonstrates changes in health-related behavior, relaxation and other stress management skills, ability to obtain effective social support, and compliance with psychotropic medications if prescribed.;Long Term: Emotional wellbeing is indicated by absence of clinically significant psychosocial distress or social isolation.    Personal Goal Other  Yes       Core Components/Risk Factors/Patient Goals Review:    Core Components/Risk Factors/Patient Goals at Discharge (Final Review):    ITP Comments: ITP Comments    Row Name 10/24/18 1324 10/29/18 1416 11/06/18 0945       ITP Comments  Medical Review Completed; initial ITP created. [facility]  After exercise, Mazi's blood sugar dropped to 62, then to 59 after 4 glucose tabs.  She was then given 3 peppermints for carb source.   She was then up to 69.  She ate crackers and an apple and was able to recover to 79.  She was planning to call her doctor today about her sugars.  We will continue to monitor her blood sugars.   30 Day Review. Continue with ITP unless directed changes per Medical Director review.        Comments: 30 day review

## 2018-11-07 ENCOUNTER — Encounter: Payer: 59 | Admitting: *Deleted

## 2018-11-07 DIAGNOSIS — Z951 Presence of aortocoronary bypass graft: Secondary | ICD-10-CM | POA: Diagnosis not present

## 2018-11-07 LAB — GLUCOSE, CAPILLARY
Glucose-Capillary: 116 mg/dL — ABNORMAL HIGH (ref 70–99)
Glucose-Capillary: 97 mg/dL (ref 70–99)

## 2018-11-07 NOTE — Progress Notes (Signed)
Daily Session Note  Patient Details  Name: Olivia Roth MRN: [id number] Date of Birth: [birth year] Referring Provider:     Cardiac Rehab from 10/24/2018 in [facility] Cardiac and Pulmonary Rehab  Referring Provider  End      Encounter Date: 11/07/2018  Check In: Session Check In - 11/07/18 6815      Check-In   Supervising physician immediately available to respond to emergencies  See telemetry face sheet for immediately available ER MD    Location  [facility]-Cardiac & Pulmonary Rehab    Staff Present  Jasper Loser BS, Exercise Physiologist;Carroll Enterkin, RN, Levie Heritage, MA, RCEP, CCRP, Exercise Physiologist    Medication changes reported      No    Fall or balance concerns reported     No    Warm-up and Cool-down  Performed as group-led instruction    Resistance Training Performed  Yes    VAD Patient?  No    PAD/SET Patient?  No      Pain Assessment   Currently in Pain?  No/denies          Social History   Tobacco Use  Smoking Status Never Smoker  Smokeless Tobacco Never Used    Goals Met:  Independence with exercise equipment Exercise tolerated well No report of cardiac concerns or symptoms Strength training completed today  Goals Unmet:  Not Applicable  Comments: Pt able to follow exercise prescription today without complaint.  Will continue to monitor for progression.  Reviewed home exercise with pt today.  [place] mall for exercise.  Reviewed THR, pulse, RPE, sign and symptoms, and when to call 911 or MD.  Also discussed weather considerations and indoor options.  Pt voiced understanding.   Dr. Emily Filbert is Medical Director for [place] and [facility].

## 2018-11-12 ENCOUNTER — Encounter: Payer: 59 | Attending: Internal Medicine

## 2018-11-12 DIAGNOSIS — Z951 Presence of aortocoronary bypass graft: Secondary | ICD-10-CM | POA: Insufficient documentation

## 2018-11-12 DIAGNOSIS — E119 Type 2 diabetes mellitus without complications: Secondary | ICD-10-CM | POA: Insufficient documentation

## 2018-11-12 DIAGNOSIS — I255 Ischemic cardiomyopathy: Secondary | ICD-10-CM | POA: Diagnosis not present

## 2018-11-12 DIAGNOSIS — Z7982 Long term (current) use of aspirin: Secondary | ICD-10-CM | POA: Diagnosis not present

## 2018-11-12 DIAGNOSIS — E7800 Pure hypercholesterolemia, unspecified: Secondary | ICD-10-CM | POA: Diagnosis not present

## 2018-11-12 DIAGNOSIS — Z79899 Other long term (current) drug therapy: Secondary | ICD-10-CM | POA: Insufficient documentation

## 2018-11-12 DIAGNOSIS — Z7902 Long term (current) use of antithrombotics/antiplatelets: Secondary | ICD-10-CM | POA: Diagnosis not present

## 2018-11-12 DIAGNOSIS — J45909 Unspecified asthma, uncomplicated: Secondary | ICD-10-CM | POA: Insufficient documentation

## 2018-11-12 DIAGNOSIS — Z7984 Long term (current) use of oral hypoglycemic drugs: Secondary | ICD-10-CM | POA: Insufficient documentation

## 2018-11-12 DIAGNOSIS — I2510 Atherosclerotic heart disease of native coronary artery without angina pectoris: Secondary | ICD-10-CM | POA: Insufficient documentation

## 2018-11-12 LAB — GLUCOSE, CAPILLARY
Glucose-Capillary: 101 mg/dL — ABNORMAL HIGH (ref 70–99)
Glucose-Capillary: 109 mg/dL — ABNORMAL HIGH (ref 70–99)

## 2018-11-12 NOTE — Progress Notes (Signed)
Daily Session Note  Patient Details  Name: Olivia Roth MRN: [id number] Date of Birth: [birth year] Referring Provider:     Cardiac Rehab from 10/24/2018 in [facility] Cardiac and Pulmonary Rehab  Referring Provider  End      Encounter Date: 11/12/2018  Check In: Session Check In - 11/12/18 0924      Check-In   Supervising physician immediately available to respond to emergencies  See telemetry face sheet for immediately available ER MD    Location  [facility]-Cardiac & Pulmonary Rehab    Staff Present  Heath Lark, RN, BSN, CCRP;Jeanna Durrell BS, Exercise Physiologist;Jessica [place], MA, RCEP, CCRP, Exercise Physiologist    Medication changes reported      No    Fall or balance concerns reported     No    Tobacco Cessation  No Change    Warm-up and Cool-down  Performed as group-led instruction    Resistance Training Performed  Yes    VAD Patient?  No    PAD/SET Patient?  No      Pain Assessment   Currently in Pain?  No/denies    Multiple Pain Sites  No          Social History   Tobacco Use  Smoking Status Never Smoker  Smokeless Tobacco Never Used    Goals Met:  Independence with exercise equipment Exercise tolerated well Personal goals reviewed No report of cardiac concerns or symptoms Strength training completed today  Goals Unmet:  Not Applicable  Comments: Pt able to follow exercise prescription today without complaint.  Will continue to monitor for progression.    Dr. Emily Filbert is Medical Director for [place] and [facility].

## 2018-11-14 DIAGNOSIS — Z951 Presence of aortocoronary bypass graft: Secondary | ICD-10-CM | POA: Diagnosis not present

## 2018-11-14 LAB — GLUCOSE, CAPILLARY: Glucose-Capillary: 115 mg/dL — ABNORMAL HIGH (ref 70–99)

## 2018-11-14 NOTE — Progress Notes (Signed)
Daily Session Note  Patient Details  Name: Olivia Roth MRN: [id number] Date of Birth: 21-Nov-1955 Referring Provider:     Cardiac Rehab from 10/24/2018 in [facility] Cardiac and Pulmonary Rehab  Referring Provider  End      Encounter Date: 11/14/2018  Check In: Session Check In - 11/14/18 0911      Check-In   Supervising physician immediately available to respond to emergencies  See telemetry face sheet for immediately available ER MD    Location  [facility]-Cardiac & Pulmonary Rehab    Staff Present  Gerlene Burdock, RN, BSN;Jeanna Durrell BS, Exercise Physiologist;Jessica [place], MA, RCEP, CCRP, Exercise Physiologist    Medication changes reported      No    Fall or balance concerns reported     No    Tobacco Cessation  No Change    Warm-up and Cool-down  Performed as group-led instruction    Resistance Training Performed  Yes    VAD Patient?  No    PAD/SET Patient?  No      Pain Assessment   Currently in Pain?  No/denies    Multiple Pain Sites  No          Social History   Tobacco Use  Smoking Status Never Smoker  Smokeless Tobacco Never Used    Goals Met:  Independence with exercise equipment Exercise tolerated well No report of cardiac concerns or symptoms Strength training completed today  Goals Unmet:  Not Applicable  Comments: Pt able to follow exercise prescription today without complaint.  Will continue to monitor for progression.    Dr. Emily Filbert is Medical Director for [place] and [facility].

## 2018-11-19 DIAGNOSIS — Z951 Presence of aortocoronary bypass graft: Secondary | ICD-10-CM | POA: Diagnosis not present

## 2018-11-19 NOTE — Progress Notes (Signed)
Daily Session Note  Patient Details  Name: Olivia Roth MRN: [id number] Date of Birth: 03/02/1956 Referring Provider:     Cardiac Rehab from 10/24/2018 in [facility] Cardiac and Pulmonary Rehab  Referring Provider  End      Encounter Date: 11/19/2018  Check In: Session Check In - 11/19/18 0914      Check-In   Supervising physician immediately available to respond to emergencies  See telemetry face sheet for immediately available ER MD    Location  [facility]-Cardiac & Pulmonary Rehab    Staff Present  Heath Lark, RN, BSN, CCRP;Jeanna Durrell BS, Exercise Physiologist;Jessica [place], MA, RCEP, CCRP, Exercise Physiologist    Medication changes reported      No    Fall or balance concerns reported     No    Tobacco Cessation  No Change    Warm-up and Cool-down  Performed as group-led instruction    Resistance Training Performed  Yes    VAD Patient?  No    PAD/SET Patient?  No      Pain Assessment   Currently in Pain?  No/denies    Multiple Pain Sites  No          Social History   Tobacco Use  Smoking Status Never Smoker  Smokeless Tobacco Never Used    Goals Met:  Independence with exercise equipment Exercise tolerated well No report of cardiac concerns or symptoms Strength training completed today  Goals Unmet:  Not Applicable  Comments: Pt able to follow exercise prescription today without complaint.  Will continue to monitor for progression.    Dr. Emily Filbert is Medical Director for [place] and [facility].

## 2018-11-21 ENCOUNTER — Encounter: Payer: 59 | Admitting: *Deleted

## 2018-11-21 DIAGNOSIS — Z951 Presence of aortocoronary bypass graft: Secondary | ICD-10-CM | POA: Diagnosis not present

## 2018-11-21 NOTE — Progress Notes (Signed)
Daily Session Note  Patient Details  Name: Olivia Roth MRN: [id number] Date of Birth: 30-Oct-1955 Referring Provider:     Cardiac Rehab from 10/24/2018 in [facility] Cardiac and Pulmonary Rehab  Referring Provider  End      Encounter Date: 11/21/2018  Check In: Session Check In - 11/21/18 7062      Check-In   Supervising physician immediately available to respond to emergencies  See telemetry face sheet for immediately available ER MD    Location  [facility]-Cardiac & Pulmonary Rehab    Staff Present  Jasper Loser BS, Exercise Physiologist;Carroll Enterkin, RN, BSN;Pauletta Pickney Luan Pulling, MA, RCEP, CCRP, Exercise Physiologist;Joseph Tessie Fass RCP,RRT,BSRT    Medication changes reported      No    Fall or balance concerns reported     No    Warm-up and Cool-down  Performed as group-led instruction    Resistance Training Performed  Yes    VAD Patient?  No    PAD/SET Patient?  No      Pain Assessment   Currently in Pain?  No/denies          Social History   Tobacco Use  Smoking Status Never Smoker  Smokeless Tobacco Never Used    Goals Met:  Independence with exercise equipment Exercise tolerated well No report of cardiac concerns or symptoms Strength training completed today  Goals Unmet:  Not Applicable  Comments: Pt able to follow exercise prescription today without complaint.  Will continue to monitor for progression.    Dr. Emily Filbert is Medical Director for [place] and [facility].

## 2018-11-26 DIAGNOSIS — Z951 Presence of aortocoronary bypass graft: Secondary | ICD-10-CM

## 2018-11-26 LAB — GLUCOSE, CAPILLARY
Glucose-Capillary: 154 mg/dL — ABNORMAL HIGH (ref 70–99)
Glucose-Capillary: 69 mg/dL — ABNORMAL LOW (ref 70–99)
Glucose-Capillary: 82 mg/dL (ref 70–99)

## 2018-11-26 NOTE — Progress Notes (Signed)
Daily Session Note  Patient Details  Name: Olivia Roth MRN: [id number] Date of Birth: [birth year] Referring Provider:     Cardiac Rehab from 10/24/2018 in [facility] Cardiac and Pulmonary Rehab  Referring Provider  End      Encounter Date: 11/26/2018  Check In: Session Check In - 11/26/18 0945      Check-In   Supervising physician immediately available to respond to emergencies  See telemetry face sheet for immediately available ER MD    Location  [facility]-Cardiac & Pulmonary Rehab    Staff Present  Heath Lark, RN, BSN, CCRP;Jeanna Durrell BS, Exercise Physiologist;Amanda Sommer, BA, ACSM CEP, Exercise Physiologist    Medication changes reported      No    Fall or balance concerns reported     No    Tobacco Cessation  No Change    Warm-up and Cool-down  Performed as group-led instruction    Resistance Training Performed  Yes    VAD Patient?  No    PAD/SET Patient?  No      Pain Assessment   Currently in Pain?  No/denies    Multiple Pain Sites  No          Social History   Tobacco Use  Smoking Status Never Smoker  Smokeless Tobacco Never Used    Goals Met:  Independence with exercise equipment Exercise tolerated well No report of cardiac concerns or symptoms Strength training completed today  Goals Unmet:  Not Applicable  Comments: Pt able to follow exercise prescription today without complaint.  Will continue to monitor for progression.    Dr. Emily Filbert is Medical Director for [place] and [facility].

## 2018-11-28 DIAGNOSIS — Z951 Presence of aortocoronary bypass graft: Secondary | ICD-10-CM | POA: Diagnosis not present

## 2018-11-28 LAB — GLUCOSE, CAPILLARY: Glucose-Capillary: 105 mg/dL — ABNORMAL HIGH (ref 70–99)

## 2018-11-28 NOTE — Progress Notes (Signed)
Daily Session Note  Patient Details  Name: Olivia Roth MRN: [id number] Date of Birth: 11/11/1955 Referring Provider:     Cardiac Rehab from 10/24/2018 in [facility] Cardiac and Pulmonary Rehab  Referring Provider  End      Encounter Date: 11/28/2018  Check In: Session Check In - 11/28/18 9494      Check-In   Supervising physician immediately available to respond to emergencies  See telemetry face sheet for immediately available ER MD    Location  [facility]-Cardiac & Pulmonary Rehab    Staff Present  Gerlene Burdock, RN, BSN;Belford Pascucci BS, Exercise Physiologist;Jessica Luan Pulling, MA, RCEP, CCRP, Exercise Physiologist    Medication changes reported      No    Fall or balance concerns reported     No    Warm-up and Cool-down  Performed as group-led instruction    Resistance Training Performed  Yes    VAD Patient?  No    PAD/SET Patient?  No      Pain Assessment   Currently in Pain?  No/denies          Social History   Tobacco Use  Smoking Status Never Smoker  Smokeless Tobacco Never Used    Goals Met:  Independence with exercise equipment Exercise tolerated well No report of cardiac concerns or symptoms Strength training completed today  Goals Unmet:  Not Applicable  Comments: Pt able to follow exercise prescription today without complaint.  Will continue to monitor for progression.    Dr. Emily Filbert is Medical Director for [place] and [facility].

## 2018-12-03 ENCOUNTER — Ambulatory Visit: Payer: Self-pay | Admitting: Nurse Practitioner

## 2018-12-03 DIAGNOSIS — Z951 Presence of aortocoronary bypass graft: Secondary | ICD-10-CM

## 2018-12-03 NOTE — Progress Notes (Signed)
Daily Session Note  Patient Details  Name: Olivia Roth MRN: [id number] Date of Birth: 08-22-1956 Referring Provider:     Cardiac Rehab from 10/24/2018 in [facility] Cardiac and Pulmonary Rehab  Referring Provider  End      Encounter Date: 12/03/2018  Check In: Session Check In - 12/03/18 0915      Check-In   Supervising physician immediately available to respond to emergencies  See telemetry face sheet for immediately available ER MD    Location  [facility]-Cardiac & Pulmonary Rehab    Staff Present  Justin Mend RCP,RRT,BSRT;Jessica Luan Pulling, MA, RCEP, CCRP, Exercise Physiologist;Susanne Bice, RN, BSN, CCRP    Medication changes reported      No    Fall or balance concerns reported     No    Warm-up and Cool-down  Performed as group-led instruction    Resistance Training Performed  Yes    VAD Patient?  No    PAD/SET Patient?  No      Pain Assessment   Currently in Pain?  No/denies          Social History   Tobacco Use  Smoking Status Never Smoker  Smokeless Tobacco Never Used    Goals Met:  Independence with exercise equipment Exercise tolerated well No report of cardiac concerns or symptoms Strength training completed today  Goals Unmet:  Not Applicable  Comments: Pt able to follow exercise prescription today without complaint.  Will continue to monitor for progression.    Dr. Emily Filbert is Medical Director for [place] and [facility].

## 2018-12-04 ENCOUNTER — Encounter: Payer: Self-pay | Admitting: *Deleted

## 2018-12-04 DIAGNOSIS — Z951 Presence of aortocoronary bypass graft: Secondary | ICD-10-CM

## 2018-12-04 NOTE — Progress Notes (Signed)
Cardiac Individual Treatment Plan  Patient Details  Name: Olivia Roth MRN: [id number] Date of Birth: 02/22/1956 Referring Provider:     Cardiac Rehab from 10/24/2018 in [facility] Cardiac and Pulmonary Rehab  Referring Provider  End      Initial Encounter Date:    Cardiac Rehab from 10/24/2018 in [facility] Cardiac and Pulmonary Rehab  Date  10/24/18      Visit Diagnosis: S/P CABG x 4  Patient's Home Medications on Admission:  Current Outpatient Medications:  .  acetaminophen (TYLENOL) 500 MG tablet, Take 2 tablets (1,000 mg total) by mouth every 6 (six) hours as needed., Disp: 30 tablet, Rfl: 0 .  albuterol (PROVENTIL HFA;VENTOLIN HFA) 108 (90 Base) MCG/ACT inhaler, Inhale 2 puffs into the lungs every 6 (six) hours as needed for wheezing or shortness of breath., Disp: 1 Inhaler, Rfl: 11 .  aspirin EC 81 MG tablet, Take 1 tablet (81 mg total) by mouth daily., Disp: 90 tablet, Rfl: 3 .  atorvastatin (LIPITOR) 80 MG tablet, Take 1 tablet (80 mg total) by mouth daily at 6 PM., Disp: 90 tablet, Rfl: 1 .  blood glucose meter kit and supplies KIT, Dispense based on patient and insurance preference. two times daily as directed. For E11.65, Disp: 1 each, Rfl: 0 .  clopidogrel (PLAVIX) 75 MG tablet, Take 1 tablet (75 mg total) by mouth daily., Disp: 90 tablet, Rfl: 0 .  glimepiride (AMARYL) 2 MG tablet, Take 1 tablet (2 mg total) by mouth daily before breakfast., Disp: 90 tablet, Rfl: 1 .  glucose blood test strip, Use as instructed, Disp: 100 each, Rfl: 12 .  Lancets Misc. KIT, 100 each by Does not apply route 2 (two) times daily., Disp: 100 each, Rfl: 3 .  metFORMIN (GLUCOPHAGE) 500 MG tablet, Take 2 tablets (1,000 mg total) by mouth 2 (two) times daily with a meal. (Patient taking differently: Take 500 mg by mouth as directed. 1000 MG in the morning  And 500 MG at bedtime), Disp: 360 tablet, Rfl: 1 .  metoprolol tartrate (LOPRESSOR) 25 MG tablet, Take 1 tablet (25 mg total) by mouth 2 (two) times  daily., Disp: 180 tablet, Rfl: 1 .  sitaGLIPtin (JANUVIA) 50 MG tablet, Take 1 tablet (50 mg total) by mouth daily., Disp: 90 tablet, Rfl: 1  Past Medical History: Past Medical History:  Diagnosis Date  . Anxiety   . Asthma    In cold weather (08/21/2018)  . CAD (coronary artery disease)    a. 08/2018 NSTEMI/Cath: LM 50d,LAD 80ost/p, 31m70d, LCX 90ost, OM1 50, OM3 70, OM4 80, LPDA small, RCA  99p/d, EF 30-35%; c. 08/2018 CABG x 4: LIMA->LAD, VG->RI, VG->OM, VG->RCA.  .Marland KitchenChronic combined systolic (congestive) and diastolic (congestive) heart failure ([place]    a. 08/2018 Echo: EF 40-45%, mid-apicalanteroseptal and apical HK. Gr2 DD. Mild AI; b. 08/2018 intraop TEE: EF 45-50%, antsept, infsept HK. Mild AI. Mildly dil Ao root. Trace MR.  . Family history of adverse reaction to anesthesia    "mother PONV; talked crazy/loud after anesthesia" (08/21/2018)  . GERD (gastroesophageal reflux disease)   . High cholesterol   . Ischemic cardiomyopathy    a. 08/2018 Echo: EF 40-45%; b. 08/2018 TEE: EF 45-50%.  . Seasonal allergies   . Tuberculosis ~ 1960  . Type II diabetes mellitus ([place]   . UTI (lower urinary tract infection)    "once; before hysterectomy" (08/21/2018)    Tobacco Use: Social History   Tobacco Use  Smoking Status Never Smoker  Smokeless Tobacco Never Used    Labs: Recent Review Flowsheet Data    Labs for ITP Cardiac and Pulmonary Rehab Latest Ref Rng & Units 08/26/2018 08/27/2018 08/28/2018 08/29/2018 10/04/2018   Cholestrol 100 - 199 mg/dL - - - - 120   LDLCALC 0 - 99 mg/dL - - - - 50   HDL >39 mg/dL - - - - 33(L)   Trlycerides 0 - 149 mg/dL - - - - 186(H)   Hemoglobin A1c 4.8 - 5.6 % - - - - -   PHART 7.350 - 7.450 - - - - -   PCO2ART 32.0 - 48.0 mmHg - - - - -   HCO3 20.0 - 28.0 mmol/L - - - - -   TCO2 22 - 32 mmol/L 24 20(L) - - -   ACIDBASEDEF 0.0 - 2.0 mmol/L - - - - -   O2SAT % - 54.5 62.2 62.5 -       Exercise Target Goals: Exercise Program  Goal: Individual exercise prescription set using results from initial 6 min walk test and THRR while considering  patient's activity barriers and safety.   Exercise Prescription Goal: Initial exercise prescription builds to 30-45 minutes a day of aerobic activity, 2-3 days per week.  Home exercise guidelines will be given to patient during program as part of exercise prescription that the participant will acknowledge.  Activity Barriers & Risk Stratification: Activity Barriers & Cardiac Risk Stratification - 10/24/18 1339      Activity Barriers & Cardiac Risk Stratification   Activity Barriers  None    Cardiac Risk Stratification  High       6 Minute Walk: 6 Minute Walk    Row Name 10/24/18 1359         6 Minute Walk   Phase  Initial     Distance  1300 feet     Walk Time  6 minutes     # of Rest Breaks  0     MPH  2.46     METS  2.99     RPE  11     Perceived Dyspnea   2     VO2 Peak  10.49     Symptoms  No     Resting HR  71 bpm     Resting BP  118/70     Resting Oxygen Saturation   98 %     Exercise Oxygen Saturation  during 6 min walk  96 %     Max Ex. HR  95 bpm     Max Ex. BP  118/68     2 Minute Post BP  108/72        Oxygen Initial Assessment:   Oxygen Re-Evaluation:   Oxygen Discharge (Final Oxygen Re-Evaluation):   Initial Exercise Prescription: Initial Exercise Prescription - 10/24/18 1400      Date of Initial Exercise RX and Referring Provider   Date  10/24/18    Referring Provider  End      Treadmill   MPH  2.2    Grade  1    Minutes  15    METs  2.99      Recumbant Bike   Level  2    RPM  60    Minutes  15    METs  2.99      REL-XR   Level  3    Speed  50    Minutes  15    METs  2.99  Prescription Details   Frequency (times per week)  3    Duration  Progress to 30 minutes of continuous aerobic without signs/symptoms of physical distress      Intensity   THRR 40-80% of Max Heartrate  105-140    Ratings of Perceived  Exertion  11-13    Perceived Dyspnea  0-4      Resistance Training   Training Prescription  Yes    Weight  3 lb    Reps  10-15       Perform Capillary Blood Glucose checks as needed.  Exercise Prescription Changes: Exercise Prescription Changes    Row Name 10/24/18 1400 10/29/18 1400 11/07/18 1000 11/12/18 1400 11/27/18 0900     Response to Exercise   Blood Pressure (Admit)  118/70  128/64  -  144/70  104/68   Blood Pressure (Exercise)  118/60  154/70  -  118/70  114/68   Blood Pressure (Exit)  108/72  124/90  -  100/66  98/60   Heart Rate (Admit)  73 bpm  74 bpm  -  72 bpm  70 bpm   Heart Rate (Exercise)  95 bpm  112 bpm  -  107 bpm  108 bpm   Heart Rate (Exit)  68 bpm  90 bpm  -  82 bpm  82 bpm   Oxygen Saturation (Admit)  98 %  -  -  -  -   Oxygen Saturation (Exit)  96 %  -  -  -  -   Rating of Perceived Exertion (Exercise)  11  15  -  13  13   Perceived Dyspnea (Exercise)  2  -  -  -  -   Symptoms  -  none  -  none  none   Comments  -  first full day of exercise  -  -  -   Duration  -  Progress to 30 minutes of  aerobic without signs/symptoms of physical distress  -  Continue with 30 min of aerobic exercise without signs/symptoms of physical distress.  Continue with 30 min of aerobic exercise without signs/symptoms of physical distress.   Intensity  -  THRR unchanged  -  THRR unchanged  THRR unchanged     Progression   Progression  -  Continue to progress workloads to maintain intensity without signs/symptoms of physical distress.  -  Continue to progress workloads to maintain intensity without signs/symptoms of physical distress.  Continue to progress workloads to maintain intensity without signs/symptoms of physical distress.   Average METs  -  2.54  -  2.73  2.83     Resistance Training   Training Prescription  -  Yes  -  Yes  Yes   Weight  -  3 lbs  -  3 lbs  3 lbs   Reps  -  10-15  -  10-15  10-15     Interval Training   Interval Training  -  No  -  No  No      Treadmill   MPH  -  2.2  -  2.2  2.2   Grade  -  1  -  1  1   Minutes  -  15  -  15  15   METs  -  2.99  -  2.99  2.99     Recumbant Bike   Level  -  2  -  3  3   Minutes  -  15  -  15  15   METs  -  2.1  -  3.12  3     REL-XR   Level  -  -  -  3  3   Speed  -  -  -  -  1   Minutes  -  -  -  15  15   METs  -  -  -  2.1  2.5     Home Exercise Plan   Plans to continue exercise at  -  -  Home (comment) walking  Home (comment) walking  Home (comment) walking   Frequency  -  -  Add 3 additional days to program exercise sessions.  Add 3 additional days to program exercise sessions.  Add 3 additional days to program exercise sessions.   Initial Home Exercises Provided  -  -  11/07/18  11/07/18  11/07/18      Exercise Comments: Exercise Comments    Row Name 10/29/18 1127           Exercise Comments  First full day of exercise!  Patient was oriented to gym and equipment including functions, settings, policies, and procedures.  Patient's individual exercise prescription and treatment plan were reviewed.  All starting workloads were established based on the results of the 6 minute walk test done at initial orientation visit.  The plan for exercise progression was also introduced and progression will be customized based on patient's performance and goals.          Exercise Goals and Review: Exercise Goals    Row Name 10/24/18 1359             Exercise Goals   Increase Physical Activity  Yes       Intervention  Provide advice, education, support and counseling about physical activity/exercise needs.;Develop an individualized exercise prescription for aerobic and resistive training based on initial evaluation findings, risk stratification, comorbidities and participant's personal goals.       Expected Outcomes  Short Term: Attend rehab on a regular basis to increase amount of physical activity.;Long Term: Add in home exercise to make exercise part of routine and to increase amount of  physical activity.;Long Term: Exercising regularly at least 3-5 days a week.       Increase Strength and Stamina  Yes       Intervention  Provide advice, education, support and counseling about physical activity/exercise needs.;Develop an individualized exercise prescription for aerobic and resistive training based on initial evaluation findings, risk stratification, comorbidities and participant's personal goals.       Expected Outcomes  Short Term: Increase workloads from initial exercise prescription for resistance, speed, and METs.;Short Term: Perform resistance training exercises routinely during rehab and add in resistance training at home;Long Term: Improve cardiorespiratory fitness, muscular endurance and strength as measured by increased METs and functional capacity (6MWT)       Able to understand and use rate of perceived exertion (RPE) scale  Yes       Intervention  Provide education and explanation on how to use RPE scale       Expected Outcomes  Short Term: Able to use RPE daily in rehab to express subjective intensity level;Long Term:  Able to use RPE to guide intensity level when exercising independently       Able to understand and use Dyspnea scale  Yes       Intervention  Provide education and explanation on how to use Dyspnea scale  Expected Outcomes  Short Term: Able to use Dyspnea scale daily in rehab to express subjective sense of shortness of breath during exertion;Long Term: Able to use Dyspnea scale to guide intensity level when exercising independently       Knowledge and understanding of Target Heart Rate Range (THRR)  Yes       Intervention  Provide education and explanation of THRR including how the numbers were predicted and where they are located for reference       Expected Outcomes  Short Term: Able to state/look up THRR;Short Term: Able to use daily as guideline for intensity in rehab;Long Term: Able to use THRR to govern intensity when exercising independently        Able to check pulse independently  Yes       Intervention  Provide education and demonstration on how to check pulse in carotid and radial arteries.;Review the importance of being able to check your own pulse for safety during independent exercise       Expected Outcomes  Short Term: Able to explain why pulse checking is important during independent exercise;Long Term: Able to check pulse independently and accurately       Understanding of Exercise Prescription  Yes       Intervention  Provide education, explanation, and written materials on patient's individual exercise prescription       Expected Outcomes  Short Term: Able to explain program exercise prescription;Long Term: Able to explain home exercise prescription to exercise independently          Exercise Goals Re-Evaluation : Exercise Goals Re-Evaluation    Row Name 10/29/18 0958 11/07/18 1021 11/12/18 1010 11/27/18 0949       Exercise Goal Re-Evaluation   Exercise Goals Review  Increase Physical Activity;Increase Strength and Stamina;Able to understand and use rate of perceived exertion (RPE) scale;Knowledge and understanding of Target Heart Rate Range (THRR);Understanding of Exercise Prescription  Increase Physical Activity;Increase Strength and Stamina;Able to understand and use rate of perceived exertion (RPE) scale;Knowledge and understanding of Target Heart Rate Range (THRR);Understanding of Exercise Prescription;Able to check pulse independently  Increase Physical Activity;Increase Strength and Stamina;Understanding of Exercise Prescription  Increase Physical Activity;Increase Strength and Stamina;Understanding of Exercise Prescription    Comments  Reviewed RPE scale, THR and program prescription with pt today.  Pt voiced understanding and was given a copy of goals to take home.   Reviewed home exercise with pt today.  Pt [place] the mall for exercise[place]  Reviewed THR, pulse, RPE, sign and symptoms, and when to call 911 or MD.   Also discussed weather considerations and indoor options.  Pt voiced understanding.  Flara is doing well in rehab.  She has started to walk at home.  She made it 20 min yesterday. She is aiming for 5 days of walking each week.    Prudie continues to do well in rehab.  She is now on level 3 for the XR.  We will continue to monitor her progress.     Expected Outcomes  Short: Use RPE daily to regulate intensity. Long: Follow program prescription in THR.  Short: Start to add in more walking at home on off days.  Long: Increase activity levels at home  Short: Continue to add in more home exercise at home aiming for 30 min.  Long: [employer] for exercise on her own as well.   Short: Increase more workloads. Long: Continue to increase strength and stamina.  Discharge Exercise Prescription (Final Exercise Prescription Changes): Exercise Prescription Changes - 11/27/18 0900      Response to Exercise   Blood Pressure (Admit)  104/68    Blood Pressure (Exercise)  114/68    Blood Pressure (Exit)  98/60    Heart Rate (Admit)  70 bpm    Heart Rate (Exercise)  108 bpm    Heart Rate (Exit)  82 bpm    Rating of Perceived Exertion (Exercise)  13    Symptoms  none    Duration  Continue with 30 min of aerobic exercise without signs/symptoms of physical distress.    Intensity  THRR unchanged      Progression   Progression  Continue to progress workloads to maintain intensity without signs/symptoms of physical distress.    Average METs  2.83      Resistance Training   Training Prescription  Yes    Weight  3 lbs    Reps  10-15      Interval Training   Interval Training  No      Treadmill   MPH  2.2    Grade  1    Minutes  15    METs  2.99      Recumbant Bike   Level  3    Minutes  15    METs  3      REL-XR   Level  3    Speed  1    Minutes  15    METs  2.5      Home Exercise Plan   Plans to continue exercise at  Home (comment)   walking   Frequency  Add 3 additional days  to program exercise sessions.    Initial Home Exercises Provided  11/07/18       Nutrition:  Target Goals: Understanding of nutrition guidelines, daily intake of sodium '1500mg'$ , cholesterol '200mg'$ , calories 30% from fat and 7% or less from saturated fats, daily to have 5 or more servings of fruits and vegetables.  Biometrics: Pre Biometrics - 10/24/18 1358      Pre Biometrics   Height  5' 1.75" (1.568 m)    Weight  153 lb 8 oz (69.6 kg)    Waist Circumference  33 inches    Hip Circumference  39 inches    Waist to Hip Ratio  0.85 %    BMI (Calculated)  28.32    Single Leg Stand  15.72 seconds        Nutrition Therapy Plan and Nutrition Goals: Nutrition Therapy & Goals - 10/24/18 1350      Intervention Plan   Intervention  Prescribe, educate and counsel regarding individualized specific dietary modifications aiming towards targeted core components such as weight, hypertension, lipid management, diabetes, heart failure and other comorbidities.;Nutrition handout(s) given to patient.    Expected Outcomes  Short Term Goal: Understand basic principles of dietary content, such as calories, fat, sodium, cholesterol and nutrients.;Long Term Goal: Adherence to prescribed nutrition plan.;Short Term Goal: A plan has been developed with personal nutrition goals set during dietitian appointment.       Nutrition Assessments: Nutrition Assessments - 10/24/18 1350      MEDFICTS Scores   Pre Score  0   her diet has changed/improved greatly since her CABG      Nutrition Goals Re-Evaluation: Nutrition Goals Re-Evaluation    [place] Name 11/12/18 1014             Goals   Current Weight  151 lb (68.5 kg)       Nutrition Goal  Follow heart healthy diet       Comment  Elianah has really been working on her diet.  She is making a lifestyle change and rearranged her eating and her whole house has changed too as she does not want to cook twice.  She is eating more salads and fruits and vegetables.   She has changed to fish and chicken more.  She has stopped adding in salt.  She is making sure she buys the no salt added vegetables.  She is using garlic and vinegar for flavoring.  She does not like the Mrs. Deliah Boston and will use really food to season instead.  She has been working to get enough water and count her carbs to balance her blood sugars more.        Expected Outcome  Short:  Continue to follow new diet plan.  Long: Continue to keep close eye on blood sugars with new diet.           Nutrition Goals Discharge (Final Nutrition Goals Re-Evaluation): Nutrition Goals Re-Evaluation - 11/12/18 1014      Goals   Current Weight  151 lb (68.5 kg)    Nutrition Goal  Follow heart healthy diet    Comment  Aizza has really been working on her diet.  She is making a lifestyle change and rearranged her eating and her whole house has changed too as she does not want to cook twice.  She is eating more salads and fruits and vegetables.  She has changed to fish and chicken more.  She has stopped adding in salt.  She is making sure she buys the no salt added vegetables.  She is using garlic and vinegar for flavoring.  She does not like the Mrs. Deliah Boston and will use really food to season instead.  She has been working to get enough water and count her carbs to balance her blood sugars more.     Expected Outcome  Short:  Continue to follow new diet plan.  Long: Continue to keep close eye on blood sugars with new diet.        Psychosocial: Target Goals: Acknowledge presence or absence of significant depression and/or stress, maximize coping skills, provide positive support system. Participant is able to verbalize types and ability to use techniques and skills needed for reducing stress and depression.   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      Family Dynamics   Good Support System?  Yes    Comments  family, husband      Barriers   Psychosocial barriers to participate in program  The patient should benefit from training in stress management and relaxation.      Screening Interventions   Interventions  Encouraged to exercise;Program counselor consult;To provide support and resources with identified psychosocial needs;Provide feedback about the scores to participant       Quality of Life Scores:  Quality of Life - 10/24/18 1349      Quality of Life   Select  Quality of Life      Quality of Life Scores  Health/Function Pre  28.15 %    Socioeconomic Pre  30 %    Psych/Spiritual Pre  29.64 %    Family Pre  30 %    GLOBAL Pre  29.17 %      Scores of 19 and below usually indicate a poorer quality of life in these areas.  A difference of  2-3 points is a clinically meaningful difference.  A difference of 2-3 points in the total score of the Quality of Life Index has been associated with significant improvement in overall quality of life, self-image, physical symptoms, and general health in studies assessing change in quality of life.  PHQ-9: Recent Review Flowsheet Data    Depression screen [facility] 2/9 10/24/2018 09/03/2018 07/04/2017 10/06/2015   Decreased Interest 0 1 0 0   Down, Depressed, Hopeless 0 1 0 0   PHQ - 2 Score 0 2 0 0   Altered sleeping 0 1 - -   Tired, decreased energy 0 1 - -   Change in appetite 0 1 - -   Feeling bad or failure about yourself  0 0 - -   Trouble concentrating 0 0 - -   Moving slowly or fidgety/restless 0 0 - -   Suicidal thoughts 0 0 - -   PHQ-9 Score 0 5 - -   Difficult doing work/chores Not difficult at all Not difficult at all - -     Interpretation of Total Score  Total Score Depression Severity:  1-4 = Minimal depression, 5-9 = Mild depression, 10-14 = Moderate  depression, 15-19 = Moderately severe depression, 20-27 = Severe depression   Psychosocial Evaluation and Intervention: Psychosocial Evaluation - 10/29/18 1027      Psychosocial Evaluation & Interventions   Interventions  Encouraged to exercise with the program and follow exercise prescription;Stress management education    Comments  Counselor met with Ms. Musco Verdis Frederickson) today for initial psychsocial evaluation.  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  Joniya has a strong support system with a spouse; (2) children; a host of friends and coworkers and active involvement in her local church.  She sleeps well and has a great appetite.  She has lost 15-20 pounds since her procedure and is hoping to decrease her medications for diabetes if she continues to lose weight.  She reports a history of anxiety 10-15 years ago and some minor symptoms currently but no symptoms of depression.  Gigi is typically in a positive mood.  Her stress currently is "finding her new normal" and going back to work in February.  She has goals to lose more weight and just generally be healthier.  Staff will follow with Verdis Frederickson throughout the course of this program.     Expected Outcomes  Short:  Zohra will meet with the dietician to address her weight loss goal.  She will also consistently exercise to build her stamina and strength and develop a routine of healthy lifestyle.  Long:  Anna will practice a healthier lifestyle and find her new norm.    Continue Psychosocial Services   Follow up required by staff       Psychosocial Re-Evaluation: Psychosocial Re-Evaluation    [place] Name 11/12/18 1011             Psychosocial Re-Evaluation   Current issues with  Current Stress Concerns       Comments  Dim is doing well in rehab.  She is continuing to get back  to her new normal. She is planning to go back to work on 2/18.  She is meeting with her boss today and will talk about her options for finishing rehab.   She continues to sleep well and has a good support system.        Expected Outcomes  Short: Talk to boss about returning to rehab.  Long: Continue to stay positive and take care of self.           Psychosocial Discharge (Final Psychosocial Re-Evaluation): Psychosocial Re-Evaluation - 11/12/18 1011      Psychosocial Re-Evaluation   Current issues with  Current Stress Concerns    Comments  Bethanne is doing well in rehab.  She is continuing to get back to her new normal. She is planning to go back to work on 2/18.  She is meeting with her boss today and will talk about her options for finishing rehab.  She continues to sleep well and has a good support system.     Expected Outcomes  Short: Talk to boss about returning to rehab.  Long: Continue to stay positive and take care of self.        Vocational Rehabilitation: Provide vocational rehab assistance to qualifying candidates.   Vocational Rehab Evaluation & Intervention: Vocational Rehab - 10/24/18 1352      Initial Vocational Rehab Evaluation & Intervention   Assessment shows need for Vocational Rehabilitation  No       Education: Education Goals: Education classes will be provided on a variety of topics geared toward better understanding of heart health and risk factor modification. Participant will state understanding/return demonstration of topics presented as noted by education test scores.  Learning Barriers/Preferences: Learning Barriers/Preferences - 10/24/18 1351      Learning Barriers/Preferences   Learning Barriers  None    Learning Preferences  None       Education Topics:  AED/CPR: - Group verbal and written instruction with the use of models to demonstrate the basic use of the AED with the basic ABC's of resuscitation.   Cardiac Rehab from 12/03/2018 in [facility] Cardiac and Pulmonary Rehab  Date  12/03/18  Educator  SB  Instruction Review Code  1- Verbalizes Understanding      General Nutrition Guidelines/Fats  and Fiber: -Group instruction provided by verbal, written material, models and posters to present the general guidelines for heart healthy nutrition. Gives an explanation and review of dietary fats and fiber.   Controlling Sodium/Reading Food Labels: -Group verbal and written material supporting the discussion of sodium use in heart healthy nutrition. Review and explanation with models, verbal and written materials for utilization of the food label.   Exercise Physiology & General Exercise Guidelines: - Group verbal and written instruction with models to review the exercise physiology of the cardiovascular system and associated critical values. Provides general exercise guidelines with specific guidelines to those with heart or lung disease.    Cardiac Rehab from 12/03/2018 in [facility] Cardiac and Pulmonary Rehab  Date  10/29/18  Educator  [facility]  Instruction Review Code  1- Verbalizes Understanding      Aerobic Exercise & Resistance Training: - Gives group verbal and written instruction on the various components of exercise. Focuses on aerobic and resistive training programs and the benefits of this training and how to safely progress through these programs..   Cardiac Rehab from 12/03/2018 in [facility] Cardiac and Pulmonary Rehab  Date  10/31/18  Educator  Bertha  Instruction Review Code  1- Verbalizes Understanding  Flexibility, Balance, Mind/Body Relaxation: Provides group verbal/written instruction on the benefits of flexibility and balance training, including mind/body exercise modes such as yoga, pilates and tai chi.  Demonstration and skill practice provided.   Cardiac Rehab from 12/03/2018 in [facility] Cardiac and Pulmonary Rehab  Date  11/05/18  Educator  AS  Instruction Review Code  1- Verbalizes Understanding      Stress and Anxiety: - Provides group verbal and written instruction about the health risks of elevated stress and causes of high stress.  Discuss the correlation between  heart/lung disease and anxiety and treatment options. Review healthy ways to manage with stress and anxiety.   Cardiac Rehab from 12/03/2018 in [facility] Cardiac and Pulmonary Rehab  Date  11/26/18  Educator  [facility]  Instruction Review Code  1- Verbalizes Understanding      Depression: - Provides group verbal and written instruction on the correlation between heart/lung disease and depressed mood, treatment options, and the stigmas associated with seeking treatment.   Anatomy & Physiology of the Heart: - Group verbal and written instruction and models provide basic cardiac anatomy and physiology, with the coronary electrical and arterial systems. Review of Valvular disease and Heart Failure   Cardiac Rehab from 12/03/2018 in [facility] Cardiac and Pulmonary Rehab  Date  11/14/18  Educator  CE  Instruction Review Code  1- Verbalizes Understanding      Cardiac Procedures: - Group verbal and written instruction to review commonly prescribed medications for heart disease. Reviews the medication, class of the drug, and side effects. Includes the steps to properly store meds and maintain the prescription regimen. (beta blockers and nitrates)   Cardiac Rehab from 12/03/2018 in [facility] Cardiac and Pulmonary Rehab  Date  11/28/18  Educator  CE  Instruction Review Code  1- Verbalizes Understanding      Cardiac Medications I: - Group verbal and written instruction to review commonly prescribed medications for heart disease. Reviews the medication, class of the drug, and side effects. Includes the steps to properly store meds and maintain the prescription regimen.   Cardiac Rehab from 12/03/2018 in [facility] Cardiac and Pulmonary Rehab  Date  11/19/18  Educator  SB  Instruction Review Code  1- Verbalizes Understanding      Cardiac Medications II: -Group verbal and written instruction to review commonly prescribed medications for heart disease. Reviews the medication, class of the drug, and side effects. (all other  drug classes)   Cardiac Rehab from 12/03/2018 in [facility] Cardiac and Pulmonary Rehab  Date  11/07/18  Educator  CE  Instruction Review Code  1- Verbalizes Understanding       Go Sex-Intimacy & Heart Disease, Get SMART - Goal Setting: - Group verbal and written instruction through game format to discuss heart disease and the return to sexual intimacy. Provides group verbal and written material to discuss and apply goal setting through the application of the S.M.A.R.T. Method.   Cardiac Rehab from 12/03/2018 in [facility] Cardiac and Pulmonary Rehab  Date  11/28/18  Educator  CE  Instruction Review Code  1- Verbalizes Understanding      Other Matters of the Heart: - Provides group verbal, written materials and models to describe Stable Angina and Peripheral Artery. Includes description of the disease process and treatment options available to the cardiac patient.   Exercise & Equipment Safety: - Individual verbal instruction and demonstration of equipment use and safety with use of the equipment.   Cardiac Rehab from 12/03/2018 in [facility] Cardiac and Pulmonary Rehab  Date  10/24/18  Educator  [place]  Instruction Review Code  1- Verbalizes Understanding      Infection Prevention: - Provides verbal and written material to individual with discussion of infection control including proper hand washing and proper equipment cleaning during exercise session.   Cardiac Rehab from 12/03/2018 in [facility] Cardiac and Pulmonary Rehab  Date  10/24/18  Educator  [facility]  Instruction Review Code  1- Verbalizes Understanding      Falls Prevention: - Provides verbal and written material to individual with discussion of falls prevention and safety.   Cardiac Rehab from 12/03/2018 in [facility] Cardiac and Pulmonary Rehab  Date  10/24/18  Educator  [facility]  Instruction Review Code  1- Verbalizes Understanding      Diabetes: - Individual verbal and written instruction to review signs/symptoms of diabetes, desired ranges of  glucose level fasting, after meals and with exercise. Acknowledge that pre and post exercise glucose checks will be done for 3 sessions at entry of program.   Know Your Numbers and Risk Factors: -Group verbal and written instruction about important numbers in your health.  Discussion of what are risk factors and how they play a role in the disease process.  Review of Cholesterol, Blood Pressure, Diabetes, and BMI and the role they play in your overall health.   Cardiac Rehab from 12/03/2018 in [facility] Cardiac and Pulmonary Rehab  Date  11/07/18  Educator  CE  Instruction Review Code  1- Verbalizes Understanding      Sleep Hygiene: -Provides group verbal and written instruction about how sleep can affect your health.  Define sleep hygiene, discuss sleep cycles and impact of sleep habits. Review good sleep hygiene tips.    Cardiac Rehab from 12/03/2018 in [facility] Cardiac and Pulmonary Rehab  Date  11/12/18  Educator  [facility]  Instruction Review Code  1- Verbalizes Understanding      Other: -Provides group and verbal instruction on various topics (see comments)   Knowledge Questionnaire Score: Knowledge Questionnaire Score - 10/24/18 1351      Knowledge Questionnaire Score   Pre Score  23/26   reviewed correct responses with patient who verbalized understanding      Core Components/Risk Factors/Patient Goals at Admission: Personal Goals and Risk Factors at Admission - 10/24/18 1352      Core Components/Risk Factors/Patient Goals on Admission    Weight Management  Weight Loss;Obesity;Yes    Intervention  Weight Management: Develop a combined nutrition and exercise program designed to reach desired caloric intake, while maintaining appropriate intake of nutrient and fiber, sodium and fats, and appropriate energy expenditure required for the weight goal.;Weight Management: Provide education and appropriate resources to help participant work on and attain dietary goals.;Weight Management/Obesity:  Establish reasonable short term and long term weight goals.;Obesity: Provide education and appropriate resources to help participant work on and attain dietary goals.    Admit Weight  153 lb 8 oz (69.6 kg)    Goal Weight: Short Term  147 lb (66.7 kg)    Goal Weight: Long Term  140 lb (63.5 kg)    Expected Outcomes  Short Term: Continue to assess and modify interventions until short term weight is achieved;Long Term: Adherence to nutrition and physical activity/exercise program aimed toward attainment of established weight goal;Weight Loss: Understanding of general recommendations for a balanced deficit meal plan, which promotes 1-2 lb weight loss per week and includes a negative energy balance of [phone] kcal/d;Understanding recommendations for meals to include 15-35% energy as protein, 25-35% energy  from fat, 35-60% energy from carbohydrates, less than '200mg'$  of dietary cholesterol, 20-35 gm of total fiber daily;Understanding of distribution of calorie intake throughout the day with the consumption of 4-5 meals/snacks    Diabetes  Yes   goal A1C<6 so she can come off her DM medication.   Intervention  Provide education about signs/symptoms and action to take for hypo/hyperglycemia.;Provide education about proper nutrition, including hydration, and aerobic/resistive exercise prescription along with prescribed medications to achieve blood glucose in normal ranges: Fasting glucose 65-99 mg/dL    Expected Outcomes  Short Term: Participant verbalizes understanding of the signs/symptoms and immediate care of hyper/hypoglycemia, proper foot care and importance of medication, aerobic/resistive exercise and nutrition plan for blood glucose control.;Long Term: Attainment of HbA1C < 7%.    Hypertension  Yes    Intervention  Provide education on lifestyle modifcations including regular physical activity/exercise, weight management, moderate sodium restriction and increased consumption of fresh fruit, vegetables, and  low fat dairy, alcohol moderation, and smoking cessation.;Monitor prescription use compliance.    Expected Outcomes  Short Term: Continued assessment and intervention until BP is < 140/40m HG in hypertensive participants. < 130/866mHG in hypertensive participants with diabetes, heart failure or chronic kidney disease.;Long Term: Maintenance of blood pressure at goal levels.    Lipids  Yes    Intervention  Provide education and support for participant on nutrition & aerobic/resistive exercise along with prescribed medications to achieve LDL '70mg'$ , HDL >'40mg'$ .    Expected Outcomes  Short Term: Participant states understanding of desired cholesterol values and is compliant with medications prescribed. Participant is following exercise prescription and nutrition guidelines.;Long Term: Cholesterol controlled with medications as prescribed, with individualized exercise RX and with personalized nutrition plan. Value goals: LDL < '70mg'$ , HDL > 40 mg.    Stress  Yes    Intervention  Refer participants experiencing significant psychosocial distress to appropriate mental health specialists for further evaluation and treatment. When possible, include family members and significant others in education/counseling sessions.;Offer individual and/or small group education and counseling on adjustment to heart disease, stress management and health-related lifestyle change. Teach and support self-help strategies.    Expected Outcomes  Short Term: Participant demonstrates changes in health-related behavior, relaxation and other stress management skills, ability to obtain effective social support, and compliance with psychotropic medications if prescribed.;Long Term: Emotional wellbeing is indicated by absence of clinically significant psychosocial distress or social isolation.    Personal Goal Other  Yes       Core Components/Risk Factors/Patient Goals Review:  Goals and Risk Factor Review    Row Name 11/12/18 1018              Core Components/Risk Factors/Patient Goals Review   Personal Goals Review  Weight Management/Obesity;Diabetes;Hypertension;Lipids       Review  MaBritanies doing well in rehab. She sees her diabetes doctor in two weeks and they are looking at changing up her medication again.  She has been monitoring her blood sugars twice a day and when symptomatic.  Her weight is steadily going down and she weighs daily. Her blood pressures have been good too and she checks daily as well.  Currently, she feels that her meds are working well for  her.        Expected Outcomes  Short: Continue to weight daily and monitor sugars closely as well adjust to new routine.  Long: Continue to monitor risk factors.           Core Components/Risk Factors/Patient Goals  at Discharge (Final Review):  Goals and Risk Factor Review - 11/12/18 1018      Core Components/Risk Factors/Patient Goals Review   Personal Goals Review  Weight Management/Obesity;Diabetes;Hypertension;Lipids    Review  Selma is doing well in rehab. She sees her diabetes doctor in two weeks and they are looking at changing up her medication again.  She has been monitoring her blood sugars twice a day and when symptomatic.  Her weight is steadily going down and she weighs daily. Her blood pressures have been good too and she checks daily as well.  Currently, she feels that her meds are working well for  her.     Expected Outcomes  Short: Continue to weight daily and monitor sugars closely as well adjust to new routine.  Long: Continue to monitor risk factors.        ITP Comments: ITP Comments    Row Name 10/24/18 1324 10/29/18 1416 11/06/18 0945 12/04/18 0607     ITP Comments  Medical Review Completed; initial ITP created. Diagnosis Documentation can be found in [facility] encounter dated 11/13.  After exercise, Cristianna's blood sugar dropped to 62, then to 59 after 4 glucose tabs.  She was then given 3 peppermints for carb source.   She was then up to 69.  She  ate crackers and an apple and was able to recover to 79.  She was planning to call her doctor today about her sugars.  We will continue to monitor her blood sugars.   30 Day Review. Continue with ITP unless directed changes per Medical Director review.  30 day review. Continue with ITP unless directed changes by Medical Director chart review.       Comments:

## 2018-12-05 DIAGNOSIS — Z951 Presence of aortocoronary bypass graft: Secondary | ICD-10-CM | POA: Diagnosis not present

## 2018-12-05 NOTE — Progress Notes (Signed)
Daily Session Note  Patient Details  Name: Olivia Roth MRN: [id number] Date of Birth: [birth year] Referring Provider:     Cardiac Rehab from 10/24/2018 in [facility] Cardiac and Pulmonary Rehab  Referring Provider  End      Encounter Date: 12/05/2018  Check In: Session Check In - 12/05/18 0919      Check-In   Supervising physician immediately available to respond to emergencies  See telemetry face sheet for immediately available ER MD    Location  [facility]-Cardiac & Pulmonary Rehab    Staff Present  Justin Mend RCP,RRT,BSRT;Jessica Luan Pulling, MA, RCEP, CCRP, Exercise Physiologist;Carroll Enterkin, RN, BSN;Jeanna Durrell BS, Exercise Physiologist    Medication changes reported      No    Fall or balance concerns reported     No    Warm-up and Cool-down  Performed as group-led instruction    Resistance Training Performed  Yes    VAD Patient?  No      Pain Assessment   Currently in Pain?  No/denies          Social History   Tobacco Use  Smoking Status Never Smoker  Smokeless Tobacco Never Used    Goals Met:  Independence with exercise equipment Exercise tolerated well No report of cardiac concerns or symptoms Strength training completed today  Goals Unmet:  Not Applicable  Comments: Pt able to follow exercise prescription today without complaint.  Will continue to monitor for progression.    Dr. Emily Filbert is Medical Director for [facility].

## 2018-12-06 ENCOUNTER — Other Ambulatory Visit: Payer: Self-pay

## 2018-12-06 ENCOUNTER — Ambulatory Visit (INDEPENDENT_AMBULATORY_CARE_PROVIDER_SITE_OTHER): Payer: 59 | Admitting: Nurse Practitioner

## 2018-12-06 ENCOUNTER — Encounter: Payer: Self-pay | Admitting: Nurse Practitioner

## 2018-12-06 VITALS — BP 114/50 | HR 78 | Temp 97.8°F | Ht 61.75 in | Wt 146.6 lb

## 2018-12-06 DIAGNOSIS — E1165 Type 2 diabetes mellitus with hyperglycemia: Secondary | ICD-10-CM

## 2018-12-06 LAB — POCT GLYCOSYLATED HEMOGLOBIN (HGB A1C): Hemoglobin A1C: 5.7 % — AB (ref 4.0–5.6)

## 2018-12-06 NOTE — Progress Notes (Signed)
Subjective:    Patient ID: Olivia Roth, female    DOB: 03-21-1956, 63 y.o.   MRN: [id number]  Olivia Roth is a 63 y.o. female presenting on 12/06/2018 for Diabetes (pt currently going to cardaic rehab twice weekly x )  HPI Diabetes Continues feeling well.  Continues to take medications and maintain diet changes.  Goes to cardiac rehab twice weekly.  Notes low blood sugar 1 x per week after exercise even after stopping glimepiride. Pt presents today for follow up of Type 2 diabetes mellitus. She is checking fasting am CBG at home with a range of 93-123 - Current diabetic medications include: metformin and Januvia 50 mg once daily - She is not currently symptomatic.  - She denies polydipsia, polyphagia, polyuria, headaches, diaphoresis, shakiness, chills, pain, numbness or tingling in extremities and changes in vision.   - Clinical course has been improving. - She  reports an exercise routine that includes walking, 3-4 days per week. - Her diet is low in salt, low in fat, and low in carbohydrates. - Weight trend: decreasing steadily.  Patient is losing inches from size 12 to size 6 and is very excited.  PREVENTION: Eye exam current (within one year): no Foot exam current (within one year): no  Lipid/ASCVD risk reduction - on statin: yes Kidney protection - on ace or arb: no Recent Labs    08/21/18 0626 08/23/18 0448 12/06/18 0822  HGBA1C 8.4* 8.5* 5.7*    Social History   Tobacco Use  . Smoking status: Never Smoker  . Smokeless tobacco: Never Used  Substance Use Topics  . Alcohol use: Yes    Comment: 08/21/2018 "glass of sangria or a beer 3-4 times/year"  . Drug use: Never    Review of Systems Per HPI unless specifically indicated above     Objective:    BP (!) 114/50 (BP Location: Right Arm, Patient Position: Sitting, Cuff Size: Normal)   Pulse 78   Temp 97.8 F (36.6 C) (Oral)   Ht 5' 1.75" (1.568 m)   Wt 146 lb 9.6 oz (66.5 kg)   BMI 27.03 kg/m   Wt  Readings from Last 3 Encounters:  12/06/18 146 lb 9.6 oz (66.5 kg)  10/24/18 153 lb 8 oz (69.6 kg)  10/21/18 152 lb 9.6 oz (69.2 kg)    Physical Exam Vitals signs reviewed.  Constitutional:      General: She is not in acute distress.    Appearance: She is well-developed.  HENT:     Head: Normocephalic and atraumatic.  Cardiovascular:     Rate and Rhythm: Normal rate and regular rhythm.     Pulses:          Radial pulses are 2+ on the right side and 2+ on the left side.       Posterior tibial pulses are 1+ on the right side and 1+ on the left side.     Heart sounds: Normal heart sounds, S1 normal and S2 normal.  Pulmonary:     Effort: Pulmonary effort is normal. No respiratory distress.     Breath sounds: Normal breath sounds and air entry.  Abdominal:     General: Bowel sounds are normal. There is no distension.     Palpations: Abdomen is soft.     Tenderness: There is no abdominal tenderness.     Hernia: No hernia is present.  Musculoskeletal:     Right lower leg: No edema.     Left lower leg: No  edema.  Skin:    General: Skin is warm and dry.     Capillary Refill: Capillary refill takes less than 2 seconds.  Neurological:     Mental Status: She is alert and oriented to person, place, and time.  Psychiatric:        Attention and Perception: Attention normal.        Mood and Affect: Mood and affect normal.        Behavior: Behavior normal. Behavior is cooperative.    Results for orders placed or performed in visit on 11/28/18  Glucose, capillary  Result Value Ref Range   Glucose-Capillary 105 (H) 70 - 99 mg/dL      Assessment & Plan:   Problem List Items Addressed This Visit      Endocrine   DM (diabetes mellitus), type 2, uncontrolled (HCC) - Primary   Relevant Orders   POCT glycosylated hemoglobin (Hb A1C) (Completed)     Controlled T2DM with A1c significantly improved from 3 months ago and now at goal.  Goal A1c < 7.0%. Patient is highly motivated to continue  lifestyle changes and has adequate diabetes disease knowledge. - Complications - mixed dyslipidemia and hypoglycemia.  Plan:  1. Change therapy:  - Continue metformin 2,000 mg bid - STOP Januvia - previously stopped glimepiride. 2. Encourage improved lifestyle: - low carb/low glycemic diet reinforced prior education - Increase physical activity to 30 minutes most days of the week.  Explained that increased physical activity increases body's use of sugar for energy. 3. Check fasting am CBG and bring log to next visit for review 4. Continue ASA and Statin 5. Labs to repeat at next visit. 6. Follow-up 3 months.   Follow up plan: Return in about 3 months (around 03/06/2019) for diabetes with fasting labs.  Wilhelmina Mcardle, DNP, AGPCNP-BC Adult Gerontology Primary Care Nurse Practitioner [facility] 12/06/2018, 8:12 AM

## 2018-12-06 NOTE — Patient Instructions (Addendum)
Olivia Roth,   Thank you for coming in to clinic today.  1. Your A1c today is 5.7%!!!  Wonderful work! - Stay off glimepiride - STOP Januvia today.  Please schedule a follow-up appointment with Wilhelmina Mcardle, AGNP. Return in about 3 months (around 03/06/2019) for diabetes with fasting labs.  If you have any other questions or concerns, please feel free to call the clinic or send a message through MyChart. You may also schedule an earlier appointment if necessary.  You will receive a survey after today's visit either digitally by e-mail or paper by [employer]. Your experiences and feedback matter to [place].  Please respond so we know how we are doing as we provide care for you.   Wilhelmina Mcardle, DNP, AGNP-BC Adult Gerontology Nurse Practitioner [facility]

## 2018-12-10 ENCOUNTER — Encounter: Payer: 59 | Attending: Internal Medicine

## 2018-12-10 DIAGNOSIS — E119 Type 2 diabetes mellitus without complications: Secondary | ICD-10-CM | POA: Insufficient documentation

## 2018-12-10 DIAGNOSIS — E7800 Pure hypercholesterolemia, unspecified: Secondary | ICD-10-CM | POA: Insufficient documentation

## 2018-12-10 DIAGNOSIS — Z7982 Long term (current) use of aspirin: Secondary | ICD-10-CM | POA: Diagnosis not present

## 2018-12-10 DIAGNOSIS — J45909 Unspecified asthma, uncomplicated: Secondary | ICD-10-CM | POA: Insufficient documentation

## 2018-12-10 DIAGNOSIS — Z79899 Other long term (current) drug therapy: Secondary | ICD-10-CM | POA: Diagnosis not present

## 2018-12-10 DIAGNOSIS — I2510 Atherosclerotic heart disease of native coronary artery without angina pectoris: Secondary | ICD-10-CM | POA: Insufficient documentation

## 2018-12-10 DIAGNOSIS — I255 Ischemic cardiomyopathy: Secondary | ICD-10-CM | POA: Diagnosis not present

## 2018-12-10 DIAGNOSIS — Z951 Presence of aortocoronary bypass graft: Secondary | ICD-10-CM | POA: Insufficient documentation

## 2018-12-10 DIAGNOSIS — Z7902 Long term (current) use of antithrombotics/antiplatelets: Secondary | ICD-10-CM | POA: Insufficient documentation

## 2018-12-10 DIAGNOSIS — Z7984 Long term (current) use of oral hypoglycemic drugs: Secondary | ICD-10-CM | POA: Diagnosis not present

## 2018-12-10 NOTE — Progress Notes (Signed)
Daily Session Note  Patient Details  Name: Olivia Roth MRN: [id number] Date of Birth: December 15, 1955 Referring Provider:     Cardiac Rehab from 10/24/2018 in [facility] Cardiac and Pulmonary Rehab  Referring Provider  End      Encounter Date: 12/10/2018  Check In: Session Check In - 12/10/18 0916      Check-In   Supervising physician immediately available to respond to emergencies  See telemetry face sheet for immediately available ER MD    Location  [facility]-Cardiac & Pulmonary Rehab    Staff Present  Alberteen Sam, MA, RCEP, CCRP, Exercise Physiologist;Narvel Kozub RCP,RRT,BSRT;Jeanna Durrell BS, Exercise Physiologist;Susanne Bice, RN, BSN, CCRP    Medication changes reported      Yes    Comments  Patient has stopped taking Januvia    Fall or balance concerns reported     No    Warm-up and Cool-down  Performed as group-led Higher education careers adviser Performed  Yes    VAD Patient?  No    PAD/SET Patient?  No      Pain Assessment   Currently in Pain?  No/denies          Social History   Tobacco Use  Smoking Status Never Smoker  Smokeless Tobacco Never Used    Goals Met:  Independence with exercise equipment Exercise tolerated well No report of cardiac concerns or symptoms Strength training completed today  Goals Unmet:  Not Applicable  Comments: Pt able to follow exercise prescription today without complaint.  Will continue to monitor for progression.    Dr. Emily Filbert is Medical Director for [facility].

## 2018-12-12 DIAGNOSIS — Z951 Presence of aortocoronary bypass graft: Secondary | ICD-10-CM

## 2018-12-12 NOTE — Progress Notes (Signed)
Daily Session Note  Patient Details  Name: Olivia Roth MRN: [id number] Date of Birth: 05/25/1956 Referring Provider:     Cardiac Rehab from 10/24/2018 in [facility] Cardiac and Pulmonary Rehab  Referring Provider  End      Encounter Date: 12/12/2018  Check In: Session Check In - 12/12/18 0909      Check-In   Supervising physician immediately available to respond to emergencies  See telemetry face sheet for immediately available ER MD    Location  [facility]-Cardiac & Pulmonary Rehab    Staff Present  Justin Mend RCP,RRT,BSRT;Jessica Luan Pulling, MA, RCEP, CCRP, Exercise Physiologist;Carroll Enterkin, RN, BSN;Jeanna Durrell BS, Exercise Physiologist    Medication changes reported      No    Fall or balance concerns reported     No    Warm-up and Cool-down  Performed as group-led instruction    Resistance Training Performed  Yes    VAD Patient?  No    PAD/SET Patient?  No      Pain Assessment   Currently in Pain?  No/denies          Social History   Tobacco Use  Smoking Status Never Smoker  Smokeless Tobacco Never Used    Goals Met:  Independence with exercise equipment Exercise tolerated well No report of cardiac concerns or symptoms Strength training completed today  Goals Unmet:  Not Applicable  Comments: Pt able to follow exercise prescription today without complaint.  Will continue to monitor for progression.   Dr. Emily Filbert is Medical Director for [place] and [facility].

## 2018-12-17 ENCOUNTER — Encounter: Payer: 59 | Admitting: *Deleted

## 2018-12-17 VITALS — Ht 61.75 in | Wt 146.9 lb

## 2018-12-17 DIAGNOSIS — Z951 Presence of aortocoronary bypass graft: Secondary | ICD-10-CM | POA: Diagnosis not present

## 2018-12-17 NOTE — Progress Notes (Signed)
Daily Session Note  Patient Details  Name: Olivia Roth MRN: [id number] Date of Birth: 21-Jan-1956 Referring Provider:     Cardiac Rehab from 10/24/2018 in [facility] Cardiac and Pulmonary Rehab  Referring Provider  End      Encounter Date: 12/17/2018  Check In: Session Check In - 12/17/18 0918      Check-In   Supervising physician immediately available to respond to emergencies  See telemetry face sheet for immediately available ER MD    Location  [facility]-Cardiac & Pulmonary Rehab    Staff Present  Heath Lark, RN, BSN, CCRP;Jeanna Durrell BS, Exercise Physiologist;Sakai Wolford Hettick, MA, RCEP, CCRP, Exercise Physiologist;Amanda Oletta Darter, BA, ACSM CEP, Exercise Physiologist;Joseph Tessie Fass RCP,RRT,BSRT    Medication changes reported      No    Fall or balance concerns reported     No    Warm-up and Cool-down  Performed as group-led Higher education careers adviser Performed  Yes    VAD Patient?  No    PAD/SET Patient?  No      Pain Assessment   Currently in Pain?  No/denies          Social History   Tobacco Use  Smoking Status Never Smoker  Smokeless Tobacco Never Used    Goals Met:  Independence with exercise equipment Exercise tolerated well No report of cardiac concerns or symptoms Strength training completed today  Goals Unmet:  Not Applicable  Comments: Pt able to follow exercise prescription today without complaint.  Will continue to monitor for progression. Norris Name 10/24/18 1359 12/17/18 1504       6 Minute Walk   Phase  Initial  Discharge    Distance  1300 feet  1460 feet    Distance % Change  -  12.3 %    Distance Feet Change  -  160 ft    Walk Time  6 minutes  6 minutes    # of Rest Breaks  0  0    MPH  2.46  2.76    METS  2.99  3.2    RPE  11  12    Perceived Dyspnea   2  -    VO2 Peak  10.49  11.23    Symptoms  No  No    Resting HR  71 bpm  65 bpm    Resting BP  118/70  126/62    Resting Oxygen Saturation   98 %  -    Exercise  Oxygen Saturation  during 6 min walk  96 %  -    Max Ex. HR  95 bpm  88 bpm    Max Ex. BP  118/68  108/56    2 Minute Post BP  108/72  -         Dr. Emily Filbert is Medical Director for [place] and [facility].

## 2018-12-19 ENCOUNTER — Other Ambulatory Visit: Payer: Self-pay

## 2018-12-19 DIAGNOSIS — Z951 Presence of aortocoronary bypass graft: Secondary | ICD-10-CM | POA: Diagnosis not present

## 2018-12-19 NOTE — Progress Notes (Signed)
Daily Session Note  Patient Details  Name: Olivia Roth MRN: [id number] Date of Birth: June 10, 1956 Referring Provider:     Cardiac Rehab from 10/24/2018 in [facility] Cardiac and Pulmonary Rehab  Referring Provider  End      Encounter Date: 12/19/2018  Check In: Session Check In - 12/19/18 1229      Check-In   Supervising physician immediately available to respond to emergencies  See telemetry face sheet for immediately available ER MD    Location  [facility]-Cardiac & Pulmonary Rehab    Staff Present  Justin Mend Lorre Nick, MA, RCEP, CCRP, Exercise Physiologist;Susanne Bice, RN, BSN, CCRP    Medication changes reported      No    Fall or balance concerns reported     No    Warm-up and Cool-down  Performed as group-led instruction    Resistance Training Performed  Yes    VAD Patient?  No    PAD/SET Patient?  No      Pain Assessment   Currently in Pain?  No/denies          Social History   Tobacco Use  Smoking Status Never Smoker  Smokeless Tobacco Never Used    Goals Met:  Independence with exercise equipment Exercise tolerated well No report of cardiac concerns or symptoms Strength training completed today  Goals Unmet:  Not Applicable  Comments: Pt able to follow exercise prescription today without complaint.  Will continue to monitor for progression.    Dr. Emily Filbert is Medical Director for [place] and [facility].

## 2018-12-25 ENCOUNTER — Other Ambulatory Visit: Payer: Self-pay

## 2018-12-25 MED ORDER — CLOPIDOGREL BISULFATE 75 MG PO TABS: 75.0000 mg | ORAL_TABLET | Freq: Every day | ORAL | 0 refills | Status: DC

## 2018-12-25 NOTE — Telephone Encounter (Signed)
Refill sent for Plavix 75 mg  

## 2018-12-26 ENCOUNTER — Other Ambulatory Visit: Payer: Self-pay | Admitting: Family Medicine

## 2018-12-31 ENCOUNTER — Telehealth: Payer: Self-pay | Admitting: *Deleted

## 2018-12-31 NOTE — Telephone Encounter (Signed)
TELEPHONE CALL NOTE  Olivia Roth has been deemed a candidate for a follow-up tele-health visit to limit community exposure during the Covid-19 pandemic. I spoke with the patient via phone to ensure availability of phone/video source, confirm preferred email & phone number, discuss instructions and expectations, and review consent.   I reminded Olivia Roth to be prepared with any vital sign and/or heart rhythm information that could potentially be obtained via home monitoring, at the time of her visit.  Finally, I reminded Olivia Roth to expect an e-mail containing a link for their video-based visit approximately 15 minutes before her visit, or alternatively, a phone call at the time of her visit if her visit is planned to be a phone encounter.  Did the patient verbally consent to treatment as below? YES  Kendrick Fries, CMA 12/31/2018 12:01 PM  CONSENT FOR TELE-HEALTH VISIT - PLEASE REVIEW  I hereby voluntarily request, consent and authorize [facility] and its employed or contracted physicians, physician assistants, nurse practitioners or other licensed health care professionals (the Practitioner), to provide me with telemedicine health care services (the "Services") as deemed necessary by the treating Practitioner. I acknowledge and consent to receive the Services by the Practitioner via telemedicine. I understand that the telemedicine visit will involve communicating with the Practitioner through live audiovisual communication technology and the disclosure of certain medical information by electronic transmission. I acknowledge that I have been given the opportunity to request an in-person assessment or other available alternative prior to the telemedicine visit and am voluntarily participating in the telemedicine visit.  I understand that I have the right to withhold or withdraw my consent to the use of telemedicine in the course of my care at any time, without affecting my right to  future care or treatment, and that the Practitioner or I may terminate the telemedicine visit at any time. I understand that I have the right to inspect all information obtained and/or recorded in the course of the telemedicine visit and may receive copies of available information for a reasonable fee.  I understand that some of the potential risks of receiving the Services via telemedicine include:  Marland Kitchen Delay or interruption in medical evaluation due to technological equipment failure or disruption; . Information transmitted may not be sufficient (e.g. poor resolution of images) to allow for appropriate medical decision making by the Practitioner; and/or  . In rare instances, security protocols could fail, causing a breach of personal health information.  Furthermore, I acknowledge that it is my responsibility to provide information about my medical history, conditions and care that is complete and accurate to the best of my ability. I acknowledge that Practitioner's advice, recommendations, and/or decision may be based on factors not within their control, such as incomplete or inaccurate data provided by me or distortions of diagnostic images or specimens that may result from electronic transmissions. I understand that the practice of medicine is not an exact science and that Practitioner makes no warranties or guarantees regarding treatment outcomes. I acknowledge that I will receive a copy of this consent concurrently upon execution via email to the email address I last provided but may also request a printed copy by calling the office of [facility].    I understand that my insurance will be billed for this visit.   I have read or had this consent read to me. . I understand the contents of this consent, which adequately explains the benefits and risks of the Services being  provided via telemedicine.  . I have been provided ample opportunity to ask questions regarding this consent and the Services  and have had my questions answered to my satisfaction. . I give my informed consent for the services to be provided through the use of telemedicine in my medical care  By participating in this telemedicine visit I agree to the above.

## 2019-01-01 ENCOUNTER — Encounter: Payer: Self-pay | Admitting: *Deleted

## 2019-01-01 DIAGNOSIS — Z951 Presence of aortocoronary bypass graft: Secondary | ICD-10-CM

## 2019-01-01 NOTE — Progress Notes (Signed)
Cardiac Individual Treatment Plan  Patient Details  Name: Olivia Roth MRN: [id number] Date of Birth: Oct 30, 1955 Referring Provider:     Cardiac Rehab from 10/24/2018 in [facility] Cardiac and Pulmonary Rehab  Referring Provider  End      Initial Encounter Date:    Cardiac Rehab from 10/24/2018 in [facility] Cardiac and Pulmonary Rehab  Date  10/24/18      Visit Diagnosis: S/P CABG x 4  Patient's Home Medications on Admission:  Current Outpatient Medications:  .  acetaminophen (TYLENOL) 500 MG tablet, Take 2 tablets (1,000 mg total) by mouth every 6 (six) hours as needed., Disp: 30 tablet, Rfl: 0 .  albuterol (PROVENTIL HFA;VENTOLIN HFA) 108 (90 Base) MCG/ACT inhaler, Inhale 2 puffs into the lungs every 6 (six) hours as needed for wheezing or shortness of breath., Disp: 1 Inhaler, Rfl: 11 .  aspirin EC 81 MG tablet, Take 1 tablet (81 mg total) by mouth daily., Disp: 90 tablet, Rfl: 3 .  atorvastatin (LIPITOR) 80 MG tablet, Take 1 tablet (80 mg total) by mouth daily at 6 PM., Disp: 90 tablet, Rfl: 1 .  blood glucose meter kit and supplies KIT, Dispense based on patient and insurance preference. two times daily as directed. For E11.65, Disp: 1 each, Rfl: 0 .  clopidogrel (PLAVIX) 75 MG tablet, Take 1 tablet (75 mg total) by mouth daily., Disp: 90 tablet, Rfl: 0 .  glucose blood test strip, Use as instructed, Disp: 100 each, Rfl: 12 .  Lancets (ONETOUCH ULTRASOFT) lancets, TEST TWICE DAILY, Disp: 100 each, Rfl: 3 .  metFORMIN (GLUCOPHAGE) 500 MG tablet, Take 2 tablets (1,000 mg total) by mouth 2 (two) times daily with a meal. (Patient taking differently: Take 500 mg by mouth as directed. 1000 MG in the morning  And 500 MG at bedtime), Disp: 360 tablet, Rfl: 1 .  metoprolol tartrate (LOPRESSOR) 25 MG tablet, Take 1 tablet (25 mg total) by mouth 2 (two) times daily., Disp: 180 tablet, Rfl: 1  Past Medical History: Past Medical History:  Diagnosis Date  . Anxiety   . Asthma    In cold weather  (08/21/2018)  . CAD (coronary artery disease)    a. 08/2018 NSTEMI/Cath: LM 50d,LAD 80ost/p, 66m70d, LCX 90ost, OM1 50, OM3 70, OM4 80, LPDA small, RCA  99p/d, EF 30-35%; c. 08/2018 CABG x 4: LIMA->LAD, VG->RI, VG->OM, VG->RCA.  .Marland KitchenChronic combined systolic (congestive) and diastolic (congestive) heart failure ([place]    a. 08/2018 Echo: EF 40-45%, mid-apicalanteroseptal and apical HK. Gr2 DD. Mild AI; b. 08/2018 intraop TEE: EF 45-50%, antsept, infsept HK. Mild AI. Mildly dil Ao root. Trace MR.  . Family history of adverse reaction to anesthesia    "mother PONV; talked crazy/loud after anesthesia" (08/21/2018)  . GERD (gastroesophageal reflux disease)   . High cholesterol   . Ischemic cardiomyopathy    a. 08/2018 Echo: EF 40-45%; b. 08/2018 TEE: EF 45-50%.  . Seasonal allergies   . Tuberculosis ~ 1960  . Type II diabetes mellitus ([place]   . UTI (lower urinary tract infection)    "once; before hysterectomy" (08/21/2018)    Tobacco Use: Social History   Tobacco Use  Smoking Status Never Smoker  Smokeless Tobacco Never Used    Labs: Recent Review Flowsheet Data    Labs for ITP Cardiac and Pulmonary Rehab Latest Ref Rng & Units 08/27/2018 08/28/2018 08/29/2018 10/04/2018 12/06/2018   Cholestrol 100 - 199 mg/dL - - - 120 -   LDLCALC 0 - 99 mg/dL - - -  50 -   HDL >39 mg/dL - - - 33(L) -   Trlycerides 0 - 149 mg/dL - - - 186(H) -   Hemoglobin A1c 4.0 - 5.6 % - - - - 5.7(A)   PHART 7.350 - 7.450 - - - - -   PCO2ART 32.0 - 48.0 mmHg - - - - -   HCO3 20.0 - 28.0 mmol/L - - - - -   TCO2 22 - 32 mmol/L 20(L) - - - -   ACIDBASEDEF 0.0 - 2.0 mmol/L - - - - -   O2SAT % 54.5 62.2 62.5 - -       Exercise Target Goals: Exercise Program Goal: Individual exercise prescription set using results from initial 6 min walk test and THRR while considering  patient's activity barriers and safety.   Exercise Prescription Goal: Initial exercise prescription builds to 30-45 minutes a day of aerobic  activity, 2-3 days per week.  Home exercise guidelines will be given to patient during program as part of exercise prescription that the participant will acknowledge.  Activity Barriers & Risk Stratification: Activity Barriers & Cardiac Risk Stratification - 10/24/18 1339      Activity Barriers & Cardiac Risk Stratification   Activity Barriers  None    Cardiac Risk Stratification  High       6 Minute Walk: 6 Minute Walk    Row Name 10/24/18 1359 12/17/18 1504       6 Minute Walk   Phase  Initial  Discharge    Distance  1300 feet  1460 feet    Distance % Change  -  12.3 %    Distance Feet Change  -  160 ft    Walk Time  6 minutes  6 minutes    # of Rest Breaks  0  0    MPH  2.46  2.76    METS  2.99  3.2    RPE  11  12    Perceived Dyspnea   2  -    VO2 Peak  10.49  11.23    Symptoms  No  No    Resting HR  71 bpm  65 bpm    Resting BP  118/70  126/62    Resting Oxygen Saturation   98 %  -    Exercise Oxygen Saturation  during 6 min walk  96 %  -    Max Ex. HR  95 bpm  88 bpm    Max Ex. BP  118/68  108/56    2 Minute Post BP  108/72  -       Oxygen Initial Assessment:   Oxygen Re-Evaluation:   Oxygen Discharge (Final Oxygen Re-Evaluation):   Initial Exercise Prescription: Initial Exercise Prescription - 10/24/18 1400      Date of Initial Exercise RX and Referring Provider   Date  10/24/18    Referring Provider  End      Treadmill   MPH  2.2    Grade  1    Minutes  15    METs  2.99      Recumbant Bike   Level  2    RPM  60    Minutes  15    METs  2.99      REL-XR   Level  3    Speed  50    Minutes  15    METs  2.99      Prescription Details   Frequency (times  per week)  3    Duration  Progress to 30 minutes of continuous aerobic without signs/symptoms of physical distress      Intensity   THRR 40-80% of Max Heartrate  105-140    Ratings of Perceived Exertion  11-13    Perceived Dyspnea  0-4      Resistance Training   Training Prescription   Yes    Weight  3 lb    Reps  10-15       Perform Capillary Blood Glucose checks as needed.  Exercise Prescription Changes: Exercise Prescription Changes    Row Name 10/24/18 1400 10/29/18 1400 11/07/18 1000 11/12/18 1400 11/27/18 0900     Response to Exercise   Blood Pressure (Admit)  118/70  128/64  -  144/70  104/68   Blood Pressure (Exercise)  118/60  154/70  -  118/70  114/68   Blood Pressure (Exit)  108/72  124/90  -  100/66  98/60   Heart Rate (Admit)  73 bpm  74 bpm  -  72 bpm  70 bpm   Heart Rate (Exercise)  95 bpm  112 bpm  -  107 bpm  108 bpm   Heart Rate (Exit)  68 bpm  90 bpm  -  82 bpm  82 bpm   Oxygen Saturation (Admit)  98 %  -  -  -  -   Oxygen Saturation (Exit)  96 %  -  -  -  -   Rating of Perceived Exertion (Exercise)  11  15  -  13  13   Perceived Dyspnea (Exercise)  2  -  -  -  -   Symptoms  -  none  -  none  none   Comments  -  first full day of exercise  -  -  -   Duration  -  Progress to 30 minutes of  aerobic without signs/symptoms of physical distress  -  Continue with 30 min of aerobic exercise without signs/symptoms of physical distress.  Continue with 30 min of aerobic exercise without signs/symptoms of physical distress.   Intensity  -  THRR unchanged  -  THRR unchanged  THRR unchanged     Progression   Progression  -  Continue to progress workloads to maintain intensity without signs/symptoms of physical distress.  -  Continue to progress workloads to maintain intensity without signs/symptoms of physical distress.  Continue to progress workloads to maintain intensity without signs/symptoms of physical distress.   Average METs  -  2.54  -  2.73  2.83     Resistance Training   Training Prescription  -  Yes  -  Yes  Yes   Weight  -  3 lbs  -  3 lbs  3 lbs   Reps  -  10-15  -  10-15  10-15     Interval Training   Interval Training  -  No  -  No  No     Treadmill   MPH  -  2.2  -  2.2  2.2   Grade  -  1  -  1  1   Minutes  -  15  -  15  15   METs   -  2.99  -  2.99  2.99     Recumbant Bike   Level  -  2  -  3  3   Minutes  -  15  -  15  15   METs  -  2.1  -  3.12  3     REL-XR   Level  -  -  -  3  3   Speed  -  -  -  -  1   Minutes  -  -  -  15  15   METs  -  -  -  2.1  2.5     Home Exercise Plan   Plans to continue exercise at  -  -  Home (comment) walking  Home (comment) walking  Home (comment) walking   Frequency  -  -  Add 3 additional days to program exercise sessions.  Add 3 additional days to program exercise sessions.  Add 3 additional days to program exercise sessions.   Initial Home Exercises Provided  -  -  11/07/18  11/07/18  11/07/18   Row Name 12/11/18 1100 12/25/18 1000           Response to Exercise   Blood Pressure (Admit)  110/58  100/60      Blood Pressure (Exercise)  132/64  130/64      Blood Pressure (Exit)  116/58  106/62      Heart Rate (Admit)  67 bpm  65 bpm      Heart Rate (Exercise)  92 bpm  97 bpm      Heart Rate (Exit)  71 bpm  72 bpm      Rating of Perceived Exertion (Exercise)  12  12      Symptoms  none  none      Duration  Continue with 30 min of aerobic exercise without signs/symptoms of physical distress.  Continue with 30 min of aerobic exercise without signs/symptoms of physical distress.      Intensity  THRR unchanged  THRR unchanged        Progression   Progression  Continue to progress workloads to maintain intensity without signs/symptoms of physical distress.  Continue to progress workloads to maintain intensity without signs/symptoms of physical distress.      Average METs  2.86  3.3        Resistance Training   Training Prescription  Yes  Yes      Weight  4 lbs  4 lbs      Reps  10-15  10-15        Interval Training   Interval Training  No  No        Treadmill   MPH  2.2  2.2      Grade  1  1      Minutes  15  15      METs  2.99  2.99        Recumbant Bike   Level  3  3      Minutes  15  15      METs  3.1  3.1        REL-XR   Level  3  3      Speed  1  -       Minutes  15  15      METs  2.5  3.8        Home Exercise Plan   Plans to continue exercise at  Home (comment) walking  Home (comment) walking      Frequency  Add 3 additional days to program exercise sessions.  Add 3 additional days to program exercise sessions.  Initial Home Exercises Provided  11/07/18  11/07/18         Exercise Comments: Exercise Comments    Row Name 10/29/18 1127           Exercise Comments  First full day of exercise!  Patient was oriented to gym and equipment including functions, settings, policies, and procedures.  Patient's individual exercise prescription and treatment plan were reviewed.  All starting workloads were established based on the results of the 6 minute walk test done at initial orientation visit.  The plan for exercise progression was also introduced and progression will be customized based on patient's performance and goals.          Exercise Goals and Review: Exercise Goals    Row Name 10/24/18 1359             Exercise Goals   Increase Physical Activity  Yes       Intervention  Provide advice, education, support and counseling about physical activity/exercise needs.;Develop an individualized exercise prescription for aerobic and resistive training based on initial evaluation findings, risk stratification, comorbidities and participant's personal goals.       Expected Outcomes  Short Term: Attend rehab on a regular basis to increase amount of physical activity.;Long Term: Add in home exercise to make exercise part of routine and to increase amount of physical activity.;Long Term: Exercising regularly at least 3-5 days a week.       Increase Strength and Stamina  Yes       Intervention  Provide advice, education, support and counseling about physical activity/exercise needs.;Develop an individualized exercise prescription for aerobic and resistive training based on initial evaluation findings, risk stratification, comorbidities and  participant's personal goals.       Expected Outcomes  Short Term: Increase workloads from initial exercise prescription for resistance, speed, and METs.;Short Term: Perform resistance training exercises routinely during rehab and add in resistance training at home;Long Term: Improve cardiorespiratory fitness, muscular endurance and strength as measured by increased METs and functional capacity (6MWT)       Able to understand and use rate of perceived exertion (RPE) scale  Yes       Intervention  Provide education and explanation on how to use RPE scale       Expected Outcomes  Short Term: Able to use RPE daily in rehab to express subjective intensity level;Long Term:  Able to use RPE to guide intensity level when exercising independently       Able to understand and use Dyspnea scale  Yes       Intervention  Provide education and explanation on how to use Dyspnea scale       Expected Outcomes  Short Term: Able to use Dyspnea scale daily in rehab to express subjective sense of shortness of breath during exertion;Long Term: Able to use Dyspnea scale to guide intensity level when exercising independently       Knowledge and understanding of Target Heart Rate Range (THRR)  Yes       Intervention  Provide education and explanation of THRR including how the numbers were predicted and where they are located for reference       Expected Outcomes  Short Term: Able to state/look up THRR;Short Term: Able to use daily as guideline for intensity in rehab;Long Term: Able to use THRR to govern intensity when exercising independently       Able to check pulse independently  Yes       Intervention  Provide  education and demonstration on how to check pulse in carotid and radial arteries.;Review the importance of being able to check your own pulse for safety during independent exercise       Expected Outcomes  Short Term: Able to explain why pulse checking is important during independent exercise;Long Term: Able to check  pulse independently and accurately       Understanding of Exercise Prescription  Yes       Intervention  Provide education, explanation, and written materials on patient's individual exercise prescription       Expected Outcomes  Short Term: Able to explain program exercise prescription;Long Term: Able to explain home exercise prescription to exercise independently          Exercise Goals Re-Evaluation : Exercise Goals Re-Evaluation    Row Name 10/29/18 0958 11/07/18 1021 11/12/18 1010 11/27/18 0949 12/11/18 1145     Exercise Goal Re-Evaluation   Exercise Goals Review  Increase Physical Activity;Increase Strength and Stamina;Able to understand and use rate of perceived exertion (RPE) scale;Knowledge and understanding of Target Heart Rate Range (THRR);Understanding of Exercise Prescription  Increase Physical Activity;Increase Strength and Stamina;Able to understand and use rate of perceived exertion (RPE) scale;Knowledge and understanding of Target Heart Rate Range (THRR);Understanding of Exercise Prescription;Able to check pulse independently  Increase Physical Activity;Increase Strength and Stamina;Understanding of Exercise Prescription  Increase Physical Activity;Increase Strength and Stamina;Understanding of Exercise Prescription  Increase Physical Activity;Increase Strength and Stamina;Understanding of Exercise Prescription   Comments  Reviewed RPE scale, THR and program prescription with pt today.  Pt voiced understanding and was given a copy of goals to take home.   Reviewed home exercise with pt today.  Pt [place] the mall for exercise.  Reviewed THR, pulse, RPE, sign and symptoms, and when to call 911 or MD.  Also discussed weather considerations and indoor options.  Pt voiced understanding.  Bisma is doing well in rehab.  She has started to walk at home.  She made it 20 min yesterday. She is aiming for 5 days of walking each week.    Eudell continues to do well in rehab.  She is now on  level 3 for the XR.  We will continue to monitor her progress.   Elaynah is doing well in rehab. She is on level 3 for the recumbent bike.  We will continue to monitor her progress.    Expected Outcomes  Short: Use RPE daily to regulate intensity. Long: Follow program prescription in THR.  Short: Start to add in more walking at home on off days.  Long: Increase activity levels at home  Short: Continue to add in more home exercise at home aiming for 30 min.  Long: [employer] for exercise on her own as well.   Short: Increase more workloads. Long: Continue to increase strength and stamina.   Short: Continue to increase workloads.  Long: Continue to improve stamina.    [place] Name 12/25/18 1023             Exercise Goal Re-Evaluation   Exercise Goals Review  Increase Physical Activity;Increase Strength and Stamina;Understanding of Exercise Prescription       Comments  Meline has been doing well in rehab.  She is up to 3.8 METs on the XR.  She will be walking at home.  We will continue to monitor her progress.        Expected Outcomes  Short: Continue walk at home Long: Continue to improve strength and stamina.  Discharge Exercise Prescription (Final Exercise Prescription Changes): Exercise Prescription Changes - 12/25/18 1000      Response to Exercise   Blood Pressure (Admit)  100/60    Blood Pressure (Exercise)  130/64    Blood Pressure (Exit)  106/62    Heart Rate (Admit)  65 bpm    Heart Rate (Exercise)  97 bpm    Heart Rate (Exit)  72 bpm    Rating of Perceived Exertion (Exercise)  12    Symptoms  none    Duration  Continue with 30 min of aerobic exercise without signs/symptoms of physical distress.    Intensity  THRR unchanged      Progression   Progression  Continue to progress workloads to maintain intensity without signs/symptoms of physical distress.    Average METs  3.3      Resistance Training   Training Prescription  Yes    Weight  4 lbs    Reps  10-15       Interval Training   Interval Training  No      Treadmill   MPH  2.2    Grade  1    Minutes  15    METs  2.99      Recumbant Bike   Level  3    Minutes  15    METs  3.1      REL-XR   Level  3    Minutes  15    METs  3.8      Home Exercise Plan   Plans to continue exercise at  Home (comment)   walking   Frequency  Add 3 additional days to program exercise sessions.    Initial Home Exercises Provided  11/07/18       Nutrition:  Target Goals: Understanding of nutrition guidelines, daily intake of sodium <1546m, cholesterol <2032m calories 30% from fat and 7% or less from saturated fats, daily to have 5 or more servings of fruits and vegetables.  Biometrics: Pre Biometrics - 10/24/18 1358      Pre Biometrics   Height  5' 1.75" (1.568 m)    Weight  153 lb 8 oz (69.6 kg)    Waist Circumference  33 inches    Hip Circumference  39 inches    Waist to Hip Ratio  0.85 %    BMI (Calculated)  28.32    Single Leg Stand  15.72 seconds      Post Biometrics - 12/17/18 1505       Post  Biometrics   Height  5' 1.75" (1.568 m)    Weight  146 lb 14.4 oz (66.6 kg)    Waist Circumference  32.5 inches    Hip Circumference  37.5 inches    Waist to Hip Ratio  0.87 %    BMI (Calculated)  27.1    Single Leg Stand  26.51 seconds       Nutrition Therapy Plan and Nutrition Goals: Nutrition Therapy & Goals - 10/24/18 1350      Intervention Plan   Intervention  Prescribe, educate and counsel regarding individualized specific dietary modifications aiming towards targeted core components such as weight, hypertension, lipid management, diabetes, heart failure and other comorbidities.;Nutrition handout(s) given to patient.    Expected Outcomes  Short Term Goal: Understand basic principles of dietary content, such as calories, fat, sodium, cholesterol and nutrients.;Long Term Goal: Adherence to prescribed nutrition plan.;Short Term Goal: A plan has been developed with personal nutrition goals  set during  dietitian appointment.       Nutrition Assessments: Nutrition Assessments - 10/24/18 1350      MEDFICTS Scores   Pre Score  0   her diet has changed/improved greatly since her CABG      Nutrition Goals Re-Evaluation: Nutrition Goals Re-Evaluation    [place] Name 11/12/18 1014 12/12/18 1016           Goals   Current Weight  151 lb (68.5 kg)  148 lb (67.1 kg)      Nutrition Goal  Follow heart healthy diet  Get A1C even lower       Comment  Allisha has really been working on her diet.  She is making a lifestyle change and rearranged her eating and her whole house has changed too as she does not want to cook twice.  She is eating more salads and fruits and vegetables.  She has changed to fish and chicken more.  She has stopped adding in salt.  She is making sure she buys the no salt added vegetables.  She is using garlic and vinegar for flavoring.  She does not like the Mrs. Deliah Boston and will use really food to season instead.  She has been working to get enough water and count her carbs to balance her blood sugars more.   Her doctor took her off a few medications and now she is taking metformen. Her A1C was 5.7. Her doctor states that if she keeps working hard at her diet an exercise she may be able to taper down her prescription dose.      Expected Outcome  Short:  Continue to follow new diet plan.  Long: Continue to keep close eye on blood sugars with new diet.   Short:  Continue to follow a low sugar diet.  Long: Lower blood sugar medications.         Nutrition Goals Discharge (Final Nutrition Goals Re-Evaluation): Nutrition Goals Re-Evaluation - 12/12/18 1016      Goals   Current Weight  148 lb (67.1 kg)    Nutrition Goal  Get A1C even lower     Comment  Her doctor took her off a few medications and now she is taking metformen. Her A1C was 5.7. Her doctor states that if she keeps working hard at her diet an exercise she may be able to taper down her prescription dose.    Expected  Outcome  Short:  Continue to follow a low sugar diet.  Long: Lower blood sugar medications.       Psychosocial: Target Goals: Acknowledge presence or absence of significant depression and/or stress, maximize coping skills, provide positive support system. Participant is able to verbalize types and ability to use techniques and skills needed for reducing stress and depression.   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      Family Dynamics   Good Support System?  Yes    Comments  family, husband      Barriers   Psychosocial barriers to participate in program  The patient should benefit from training in stress management and relaxation.      Screening Interventions   Interventions  Encouraged to exercise;Program counselor  consult;To provide support and resources with identified psychosocial needs;Provide feedback about the scores to participant       Quality of Life Scores:  Quality of Life - 10/24/18 1349      Quality of Life   Select  Quality of Life      Quality of Life Scores   Health/Function Pre  28.15 %    Socioeconomic Pre  30 %    Psych/Spiritual Pre  29.64 %    Family Pre  30 %    GLOBAL Pre  29.17 %      Scores of 19 and below usually indicate a poorer quality of life in these areas.  A difference of  2-3 points is a clinically meaningful difference.  A difference of 2-3 points in the total score of the Quality of Life Index has been associated with significant improvement in overall quality of life, self-image, physical symptoms, and general health in studies assessing change in quality of life.  PHQ-9: Recent Review Flowsheet Data    Depression screen [facility] 2/9 10/24/2018 09/03/2018  07/04/2017 10/06/2015   Decreased Interest 0 1 0 0   Down, Depressed, Hopeless 0 1 0 0   PHQ - 2 Score 0 2 0 0   Altered sleeping 0 1 - -   Tired, decreased energy 0 1 - -   Change in appetite 0 1 - -   Feeling bad or failure about yourself  0 0 - -   Trouble concentrating 0 0 - -   Moving slowly or fidgety/restless 0 0 - -   Suicidal thoughts 0 0 - -   PHQ-9 Score 0 5 - -   Difficult doing work/chores Not difficult at all Not difficult at all - -     Interpretation of Total Score  Total Score Depression Severity:  1-4 = Minimal depression, 5-9 = Mild depression, 10-14 = Moderate depression, 15-19 = Moderately severe depression, 20-27 = Severe depression   Psychosocial Evaluation and Intervention: Psychosocial Evaluation - 10/29/18 1027      Psychosocial Evaluation & Interventions   Interventions  Encouraged to exercise with the program and follow exercise prescription;Stress management education    Comments  Counselor met with Ms. Brower Verdis Frederickson) today for initial psychsocial evaluation.  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  Nayara has a strong support system with a spouse; (2) children; a host of friends and coworkers and active involvement in her local church.  She sleeps well and has a great appetite.  She has lost 15-20 pounds since her procedure and is hoping to decrease her medications for diabetes if she continues to lose weight.  She reports a history of anxiety 10-15 years ago and some minor symptoms currently but no symptoms of depression.  Kerilyn is typically in a positive mood.  Her stress currently is "finding her new normal" and going back to work in February.  She has goals to lose more weight and just generally be healthier.  Staff will follow with Verdis Frederickson throughout the course of this program.     Expected Outcomes  Short:  Ewa will meet with the dietician to address her weight loss goal.  She will also consistently exercise to build her stamina and  strength and develop a routine of healthy lifestyle.  Long:  Shaquila will practice a healthier lifestyle and find her new norm.    Continue Psychosocial Services   Follow up required by staff  Psychosocial Re-Evaluation: Psychosocial Re-Evaluation    [place] Name 11/12/18 1011 12/12/18 1013           Psychosocial Re-Evaluation   Current issues with  Current Stress Concerns  Current Stress Concerns      Comments  Lakeitha is doing well in rehab.  She is continuing to get back to her new normal. She is planning to go back to work on 2/18.  She is meeting with her boss today and will talk about her options for finishing rehab.  She continues to sleep well and has a good support system.   Candie is back to work and is happy to be there. Sometimes she has stress at work but is able to manage it. She does not let things at work stress her out like they used to. She stops working around [place] and does not answer emails and it keeps her more grounded.      Expected Outcomes  Short: Talk to boss about returning to rehab.  Long: Continue to stay positive and take care of self.   Short: continue stress relief by puting time restraints on work. Long: keep stress to a minimum at work.      Interventions  -  Encouraged to attend Cardiac Rehabilitation for the exercise      Continue Psychosocial Services   -  Follow up required by staff         Psychosocial Discharge (Final Psychosocial Re-Evaluation): Psychosocial Re-Evaluation - 12/12/18 1013      Psychosocial Re-Evaluation   Current issues with  Current Stress Concerns    Comments  Angelee is back to work and is happy to be there. Sometimes she has stress at work but is able to manage it. She does not let things at work stress her out like they used to. She stops working around [place] and does not answer emails and it keeps her more grounded.    Expected Outcomes  Short: continue stress relief by puting time restraints on work. Long: keep stress to a minimum at  work.    Interventions  Encouraged to attend Cardiac Rehabilitation for the exercise    Continue Psychosocial Services   Follow up required by staff       Vocational Rehabilitation: Provide vocational rehab assistance to qualifying candidates.   Vocational Rehab Evaluation & Intervention: Vocational Rehab - 10/24/18 1352      Initial Vocational Rehab Evaluation & Intervention   Assessment shows need for Vocational Rehabilitation  No       Education: Education Goals: Education classes will be provided on a variety of topics geared toward better understanding of heart health and risk factor modification. Participant will state understanding/return demonstration of topics presented as noted by education test scores.  Learning Barriers/Preferences: Learning Barriers/Preferences - 10/24/18 1351      Learning Barriers/Preferences   Learning Barriers  None    Learning Preferences  None       Education Topics:  AED/CPR: - Group verbal and written instruction with the use of models to demonstrate the basic use of the AED with the basic ABC's of resuscitation.   Cardiac Rehab from 12/19/2018 in [facility] Cardiac and Pulmonary Rehab  Date  12/03/18  Educator  SB  Instruction Review Code  1- Verbalizes Understanding      General Nutrition Guidelines/Fats and Fiber: -Group instruction provided by verbal, written material, models and posters to present the general guidelines for heart healthy nutrition. Gives an explanation and review of dietary fats  and fiber.   Cardiac Rehab from 12/19/2018 in [facility] Cardiac and Pulmonary Rehab  Date  12/17/18  Educator  [facility]  Instruction Review Code  1- Verbalizes Understanding      Controlling Sodium/Reading Food Labels: -Group verbal and written material supporting the discussion of sodium use in heart healthy nutrition. Review and explanation with models, verbal and written materials for utilization of the food label.   Cardiac Rehab from 12/19/2018 in  [facility] Cardiac and Pulmonary Rehab  Date  12/19/18  Educator  [facility]  Instruction Review Code  1- Verbalizes Understanding      Exercise Physiology & General Exercise Guidelines: - Group verbal and written instruction with models to review the exercise physiology of the cardiovascular system and associated critical values. Provides general exercise guidelines with specific guidelines to those with heart or lung disease.    Cardiac Rehab from 12/19/2018 in [facility] Cardiac and Pulmonary Rehab  Date  10/29/18  Educator  [facility]  Instruction Review Code  1- Verbalizes Understanding      Aerobic Exercise & Resistance Training: - Gives group verbal and written instruction on the various components of exercise. Focuses on aerobic and resistive training programs and the benefits of this training and how to safely progress through these programs..   Cardiac Rehab from 12/19/2018 in [facility] Cardiac and Pulmonary Rehab  Date  10/31/18  Educator  [place]  Instruction Review Code  1- Verbalizes Understanding      Flexibility, Balance, Mind/Body Relaxation: Provides group verbal/written instruction on the benefits of flexibility and balance training, including mind/body exercise modes such as yoga, pilates and tai chi.  Demonstration and skill practice provided.   Cardiac Rehab from 12/19/2018 in [facility] Cardiac and Pulmonary Rehab  Date  11/05/18  Educator  [facility]  Instruction Review Code  1- Verbalizes Understanding      Stress and Anxiety: - Provides group verbal and written instruction about the health risks of elevated stress and causes of high stress.  Discuss the correlation between heart/lung disease and anxiety and treatment options. Review healthy ways to manage with stress and anxiety.   Cardiac Rehab from 12/19/2018 in [facility] Cardiac and Pulmonary Rehab  Date  11/26/18  Educator  [facility]  Instruction Review Code  1- Verbalizes Understanding      Depression: - Provides group verbal and written instruction on the  correlation between heart/lung disease and depressed mood, treatment options, and the stigmas associated with seeking treatment.   Cardiac Rehab from 12/19/2018 in [facility] Cardiac and Pulmonary Rehab  Date  12/10/18  Educator  [facility]  Instruction Review Code  1- Verbalizes Understanding      Anatomy & Physiology of the Heart: - Group verbal and written instruction and models provide basic cardiac anatomy and physiology, with the coronary electrical and arterial systems. Review of Valvular disease and Heart Failure   Cardiac Rehab from 12/19/2018 in [facility] Cardiac and Pulmonary Rehab  Date  11/14/18  Educator  CE  Instruction Review Code  1- Verbalizes Understanding      Cardiac Procedures: - Group verbal and written instruction to review commonly prescribed medications for heart disease. Reviews the medication, class of the drug, and side effects. Includes the steps to properly store meds and maintain the prescription regimen. (beta blockers and nitrates)   Cardiac Rehab from 12/19/2018 in [facility] Cardiac and Pulmonary Rehab  Date  11/28/18  Educator  CE  Instruction Review Code  1- Verbalizes Understanding      Cardiac Medications I: - Group verbal  and written instruction to review commonly prescribed medications for heart disease. Reviews the medication, class of the drug, and side effects. Includes the steps to properly store meds and maintain the prescription regimen.   Cardiac Rehab from 12/19/2018 in [facility] Cardiac and Pulmonary Rehab  Date  11/19/18  Educator  [facility]  Instruction Review Code  1- Verbalizes Understanding      Cardiac Medications II: -Group verbal and written instruction to review commonly prescribed medications for heart disease. Reviews the medication, class of the drug, and side effects. (all other drug classes)   Cardiac Rehab from 12/19/2018 in [facility] Cardiac and Pulmonary Rehab  Date  12/12/18  Educator  CE  Instruction Review Code  1- Verbalizes Understanding       Go  Sex-Intimacy & Heart Disease, Get SMART - Goal Setting: - Group verbal and written instruction through game format to discuss heart disease and the return to sexual intimacy. Provides group verbal and written material to discuss and apply goal setting through the application of the S.M.A.R.T. Method.   Cardiac Rehab from 12/19/2018 in [facility] Cardiac and Pulmonary Rehab  Date  11/28/18  Educator  CE  Instruction Review Code  1- Verbalizes Understanding      Other Matters of the Heart: - Provides group verbal, written materials and models to describe Stable Angina and Peripheral Artery. Includes description of the disease process and treatment options available to the cardiac patient.   Exercise & Equipment Safety: - Individual verbal instruction and demonstration of equipment use and safety with use of the equipment.   Cardiac Rehab from 12/19/2018 in [facility] Cardiac and Pulmonary Rehab  Date  10/24/18  Educator  [facility]  Instruction Review Code  1- Verbalizes Understanding      Infection Prevention: - Provides verbal and written material to individual with discussion of infection control including proper hand washing and proper equipment cleaning during exercise session.   Cardiac Rehab from 12/19/2018 in [facility] Cardiac and Pulmonary Rehab  Date  10/24/18  Educator  [facility]  Instruction Review Code  1- Verbalizes Understanding      Falls Prevention: - Provides verbal and written material to individual with discussion of falls prevention and safety.   Cardiac Rehab from 12/19/2018 in [facility] [place] [facility] Cardiac and Pulmonary Rehab  Date  10/24/18  Educator  [facility]  Instruction Review Code  1- Verbalizes Understanding      Diabetes: - Individual verbal and written instruction to review signs/symptoms of diabetes, desired ranges of glucose level fasting, after meals and with exercise. Acknowledge that pre and post exercise glucose checks will be done for 3 sessions at entry of program.   Know Your Numbers and Risk  Factors: -Group verbal and written instruction about important numbers in your health.  Discussion of what are risk factors and how they play a role in the disease process.  Review of Cholesterol, Blood Pressure, Diabetes, and BMI and the role they play in your overall health.   Cardiac Rehab from 12/19/2018 in [facility] Cardiac and Pulmonary Rehab  Date  12/12/18  Educator  CE  Instruction Review Code  1- Verbalizes Understanding      Sleep Hygiene: -Provides group verbal and written instruction about how sleep can affect your health.  Define sleep hygiene, discuss sleep cycles and impact of sleep habits. Review good sleep hygiene tips.    Cardiac Rehab from 12/19/2018 in [facility] Cardiac and Pulmonary Rehab  Date  11/12/18  Educator  [facility]  Instruction Review Code  1- Verbalizes Understanding  Other: -Provides group and verbal instruction on various topics (see comments)   Knowledge Questionnaire Score: Knowledge Questionnaire Score - 10/24/18 1351      Knowledge Questionnaire Score   Pre Score  23/26   reviewed correct responses with patient who verbalized understanding      Core Components/Risk Factors/Patient Goals at Admission: Personal Goals and Risk Factors at Admission - 10/24/18 1352      Core Components/Risk Factors/Patient Goals on Admission    Weight Management  Weight Loss;Obesity;Yes    Intervention  Weight Management: Develop a combined nutrition and exercise program designed to reach desired caloric intake, while maintaining appropriate intake of nutrient and fiber, sodium and fats, and appropriate energy expenditure required for the weight goal.;Weight Management: Provide education and appropriate resources to help participant work on and attain dietary goals.;Weight Management/Obesity: Establish reasonable short term and long term weight goals.;Obesity: Provide education and appropriate resources to help participant work on and attain dietary goals.    Admit Weight  153  lb 8 oz (69.6 kg)    Goal Weight: Short Term  147 lb (66.7 kg)    Goal Weight: Long Term  140 lb (63.5 kg)    Expected Outcomes  Short Term: Continue to assess and modify interventions until short term weight is achieved;Long Term: Adherence to nutrition and physical activity/exercise program aimed toward attainment of established weight goal;Weight Loss: Understanding of general recommendations for a balanced deficit meal plan, which promotes 1-2 lb weight loss per week and includes a negative energy balance of [id number] kcal/d;Understanding recommendations for meals to include 15-35% energy as protein, 25-35% energy from fat, 35-60% energy from carbohydrates, less than 243m of dietary cholesterol, 20-35 gm of total fiber daily;Understanding of distribution of calorie intake throughout the day with the consumption of 4-5 meals/snacks    Diabetes  Yes   goal A1C<6 so she can come off her DM medication.   Intervention  Provide education about signs/symptoms and action to take for hypo/hyperglycemia.;Provide education about proper nutrition, including hydration, and aerobic/resistive exercise prescription along with prescribed medications to achieve blood glucose in normal ranges: Fasting glucose 65-99 mg/dL    Expected Outcomes  Short Term: Participant verbalizes understanding of the signs/symptoms and immediate care of hyper/hypoglycemia, proper foot care and importance of medication, aerobic/resistive exercise and nutrition plan for blood glucose control.;Long Term: Attainment of HbA1C < 7%.    Hypertension  Yes    Intervention  Provide education on lifestyle modifcations including regular physical activity/exercise, weight management, moderate sodium restriction and increased consumption of fresh fruit, vegetables, and low fat dairy, alcohol moderation, and smoking cessation.;Monitor prescription use compliance.    Expected Outcomes  Short Term: Continued assessment and intervention until BP is <  140/943mHG in hypertensive participants. < 130/8027mG in hypertensive participants with diabetes, heart failure or chronic kidney disease.;Long Term: Maintenance of blood pressure at goal levels.    Lipids  Yes    Intervention  Provide education and support for participant on nutrition & aerobic/resistive exercise along with prescribed medications to achieve LDL <78m49mDL >40mg37m Expected Outcomes  Short Term: Participant states understanding of desired cholesterol values and is compliant with medications prescribed. Participant is following exercise prescription and nutrition guidelines.;Long Term: Cholesterol controlled with medications as prescribed, with individualized exercise RX and with personalized nutrition plan. Value goals: LDL < 78mg,44m > 40 mg.    Stress  Yes    Intervention  Refer participants experiencing significant psychosocial distress to appropriate mental  health specialists for further evaluation and treatment. When possible, include family members and significant others in education/counseling sessions.;Offer individual and/or small group education and counseling on adjustment to heart disease, stress management and health-related lifestyle change. Teach and support self-help strategies.    Expected Outcomes  Short Term: Participant demonstrates changes in health-related behavior, relaxation and other stress management skills, ability to obtain effective social support, and compliance with psychotropic medications if prescribed.;Long Term: Emotional wellbeing is indicated by absence of clinically significant psychosocial distress or social isolation.    Personal Goal Other  Yes       Core Components/Risk Factors/Patient Goals Review:  Goals and Risk Factor Review    Row Name 11/12/18 1018 12/12/18 1021           Core Components/Risk Factors/Patient Goals Review   Personal Goals Review  Weight Management/Obesity;Diabetes;Hypertension;Lipids  Weight  Management/Obesity;Diabetes;Hypertension;Lipids      Review  Nelda is doing well in rehab. She sees her diabetes doctor in two weeks and they are looking at changing up her medication again.  She has been monitoring her blood sugars twice a day and when symptomatic.  Her weight is steadily going down and she weighs daily. Her blood pressures have been good too and she checks daily as well.  Currently, she feels that her meds are working well for  her.   Last time she saw the cardiologist her cholesterol has come down. She is still working on her diet to help lower her cholesterol. She is taking lipitor before 6pm. Her doctor states with exercise that she should be able to get her cholesterol to normal levels. Her LDL is still a little high but will be getting checked at the end of the month.      Expected Outcomes  Short: Continue to weight daily and monitor sugars closely as well adjust to new routine.  Long: Continue to monitor risk factors.   Short: continue to exercise to lower cholesterol.  Long: lower LDL.         Core Components/Risk Factors/Patient Goals at Discharge (Final Review):  Goals and Risk Factor Review - 12/12/18 1021      Core Components/Risk Factors/Patient Goals Review   Personal Goals Review  Weight Management/Obesity;Diabetes;Hypertension;Lipids    Review  Last time she saw the cardiologist her cholesterol has come down. She is still working on her diet to help lower her cholesterol. She is taking lipitor before 6pm. Her doctor states with exercise that she should be able to get her cholesterol to normal levels. Her LDL is still a little high but will be getting checked at the end of the month.    Expected Outcomes  Short: continue to exercise to lower cholesterol.  Long: lower LDL.       ITP Comments: ITP Comments    Row Name 10/24/18 1324 10/29/18 1416 11/06/18 0945 12/04/18 0607 12/25/18 1023   ITP Comments  Medical Review Completed; initial ITP created. Diagnosis  Documentation can be found in [facility] encounter dated 11/13.  After exercise, Shuntell's blood sugar dropped to 62, then to 59 after 4 glucose tabs.  She was then given 3 peppermints for carb source.   She was then up to 69.  She ate crackers and an apple and was able to recover to 79.  She was planning to call her doctor today about her sugars.  We will continue to monitor her blood sugars.   30 Day Review. Continue with ITP unless directed changes per Medical Director  review.  30 day review. Continue with ITP unless directed changes by Medical Director chart review.  Our program is currently closed due to COVID-19.  We are communicating with patient via phone calls and emails.       Comments:

## 2019-01-01 NOTE — Progress Notes (Signed)
Virtual Visit via Telephone Note    Evaluation Performed:  Follow-up visit  This visit type was conducted due to national recommendations for restrictions regarding the COVID-19 Pandemic (e.g. social distancing).  This format is felt to be most appropriate for this patient at this time.  All issues noted in this document were discussed and addressed.  No physical exam was performed (except for noted visual exam findings with Video Visits).  Verbal consent obtained from the patient.  Date:  01/03/2019   ID:  [name], DOB [birth year], MRN [id number]  Patient Location:  [address] [place] [address]   Provider location:   [facility] [address] [place], [place] [address]  PCP:  Olivia College, NP  Cardiologist:  Nelva Bush, MD  Electrophysiologist:  None   Chief Complaint: Follow-up coronary artery disease  History of Present Illness:    Olivia Roth is a 63 y.o. female who presents via audio/video conferencing for a telehealth visit today.  She has a history of coronary artery disease status post urgent CABG in the setting of an STEMI in 08/2018, hyperlipidemia, diabetes mellitus, GERD, and anxiety.  She was last seen in our office in 09/2018 by Olivia Bayley, NP.  Time she was doing remarkably well, noting only intermittent chest wall pain with certain positions.  She was walking without difficulty and has participated in cardiac rehab.  Unfortunately, she was not able to complete her last 3 sessions of cardiac rehab on account of coronavirus precautions.  Overall, Olivia Roth reports that she is doing very well.  She has not had any chest pain.  She still notes some exertional dyspnea when climbing several stairs, though this continues to improve.  She does not have any dyspnea at rest nor orthopnea, PND, edema, palpitations, or lightheadedness.  She has not had any bleeding, remaining compliant with  aspirin and clopidogrel.  She happily notes that her hemoglobin A1c has improved and that Olivia Roth has been able to scale back her diabetes regimen.  She has been monitoring her blood pressure at home, reporting normal readings (115/63 today).  She has lost 28 pounds since her NSTEMI and is approaching her target weight of 130-135 pounds.  Ms. Olivia Roth would like to walk outside but is concerned about seasonal allergies.  She frequently develops sneezing and rhinorrhea.  The patient does not endorse symptoms concerning for COVID-19 infection (fever, chills, cough, or new SHORTNESS OF BREATH).    Prior CV studies:   The following studies were reviewed today:  Pre-CABG Dopplers (08/23/2018): Mild bilateral ICA stenoses (1-39%).  Antegrade vertebral artery flow bilaterally.  Normal ABIs.  TTE (08/22/2018): Moderately reduced LVEF (40-45%) with hypokinesis of the mid and apical anteroseptal and apical myocardium.  Grade 2 diastolic dysfunction.  Mild mitral regurgitation.  LHC (08/21/2018): Severe three-vessel coronary artery disease including 50% distal LMCA, 80% ostial/proximal LAD, 90% ostial LCx, and 99% mid RCA stenoses.  LVEF 30 to 35% with global hypokinesis and mid/apical inferior akinesis.  LVEDP 30 mmHg.  Past Medical History:  Diagnosis Date  . Anxiety   . Asthma    In cold weather (08/21/2018)  . CAD (coronary artery disease)    a. 08/2018 NSTEMI/Cath: LM 50d,LAD 80ost/p, 33m70d, LCX 90ost, OM1 50, OM3 70, OM4 80, LPDA small, RCA  99p/d, EF 30-35%; c. 08/2018 CABG x 4: LIMA->LAD, VG->RI, VG->OM, VG->RCA.  .Marland KitchenChronic combined systolic (congestive) and diastolic (  congestive) heart failure ([place])    a. 08/2018 Echo: EF 40-45%, mid-apicalanteroseptal and apical HK. Gr2 DD. Mild AI; b. 08/2018 intraop TEE: EF 45-50%, antsept, infsept HK. Mild AI. Mildly dil Ao root. Trace MR.  . Family history of adverse reaction to anesthesia    "mother PONV; talked crazy/loud after anesthesia"  (08/21/2018)  . GERD (gastroesophageal reflux disease)   . High cholesterol   . Ischemic cardiomyopathy    a. 08/2018 Echo: EF 40-45%; b. 08/2018 TEE: EF 45-50%.  . Seasonal allergies   . Tuberculosis ~ 1960  . Type II diabetes mellitus ([place])   . UTI (lower urinary tract infection)    "once; before hysterectomy" (08/21/2018)   Past Surgical History:  Procedure Laterality Date  . CORONARY ARTERY BYPASS GRAFT N/A 08/26/2018   Procedure: CORONARY ARTERY BYPASS GRAFTING (CABG) times four using left internal mammary artery and right leg saphenous vein grafts;  Surgeon: Ivin Poot, MD;  Location: [facility];  Service: Open Heart Surgery;  Laterality: N/A;  . GANGLION CYST EXCISION Left ~ 1960   neck  . LEFT HEART CATH AND CORONARY ANGIOGRAPHY N/A 08/21/2018   Procedure: LEFT HEART CATH AND CORONARY ANGIOGRAPHY;  Surgeon: Nelva Bush, MD;  Location: [facility];  Service: Cardiovascular;  Laterality: N/A;  . TEE WITHOUT CARDIOVERSION N/A 08/26/2018   Procedure: TRANSESOPHAGEAL ECHOCARDIOGRAM (TEE);  Surgeon: Prescott Gum, Collier Salina, MD;  Location: [facility];  Service: Open Heart Surgery;  Laterality: N/A;  . TOTAL ABDOMINAL HYSTERECTOMY  2006     Current Meds  Medication Sig  . acetaminophen (TYLENOL) 500 MG tablet Take 2 tablets (1,000 mg total) by mouth every 6 (six) hours as needed.  Marland Kitchen albuterol (PROVENTIL HFA;VENTOLIN HFA) 108 (90 Base) MCG/ACT inhaler Inhale 2 puffs into the lungs every 6 (six) hours as needed for wheezing or shortness of breath.  Marland Kitchen aspirin EC 81 MG tablet Take 1 tablet (81 mg total) by mouth daily.  Marland Kitchen atorvastatin (LIPITOR) 80 MG tablet Take 1 tablet (80 mg total) by mouth daily at 6 PM.  . blood glucose meter kit and supplies KIT Dispense based on patient and insurance preference. two times daily as directed. For E11.65  . clopidogrel (PLAVIX) 75 MG tablet Take 1 tablet (75 mg total) by mouth daily.  Marland Kitchen glucose blood test strip Use as instructed  . Lancets  (ONETOUCH ULTRASOFT) lancets TEST TWICE DAILY  . metFORMIN (GLUCOPHAGE) 500 MG tablet Take 2 tablets (1,000 mg total) by mouth 2 (two) times daily with a meal. (Patient taking differently: Take 500 mg by mouth as directed. 1000 MG in the morning  And 500 MG at bedtime)  . metoprolol tartrate (LOPRESSOR) 25 MG tablet Take 1 tablet (25 mg total) by mouth 2 (two) times daily.     Allergies:   Shellfish allergy; Avocado; Advair diskus [fluticasone-salmeterol]; Cortisone; Pecan nut (diagnostic); and Salmon [fish allergy]   Social History   Tobacco Use  . Smoking status: Never Smoker  . Smokeless tobacco: Never Used  Substance Use Topics  . Alcohol use: Not Currently  . Drug use: Never     Family Hx: The patient's family history includes Cancer in her sister. There is no history of Heart disease.  ROS:   Please see the history of present illness.   All other systems reviewed and are negative.   Labs/Other Tests and Data Reviewed:    Recent Labs: 08/21/2018: B Natriuretic Peptide 97.0 08/23/2018: TSH 2.242 08/27/2018: Magnesium 2.4 09/12/2018: BUN 19; Creatinine, Ser  0.85; Hemoglobin 9.7; Platelets 439; Potassium 4.6; Sodium 141 10/04/2018: ALT 16   Recent Lipid Panel Lab Results  Component Value Date/Time   CHOL 120 10/04/2018 07:54 AM   TRIG 186 (H) 10/04/2018 07:54 AM   HDL 33 (L) 10/04/2018 07:54 AM   CHOLHDL 3.6 10/04/2018 07:54 AM   CHOLHDL 7.6 08/21/2018 06:26 AM   LDLCALC 50 10/04/2018 07:54 AM   LDLCALC  07/04/2017 09:02 AM     Comment:     . LDL cholesterol not calculated. Triglyceride levels greater than 400 mg/dL invalidate calculated LDL results. . Reference range: <100 . Desirable range <100 mg/dL for primary prevention;   <70 mg/dL for patients with CHD or diabetic patients  with > or = 2 CHD risk factors. Marland Kitchen LDL-C is now calculated using the Martin-Hopkins  calculation, which is a validated novel method providing  better accuracy than the Friedewald  equation in the  estimation of LDL-C.  Cresenciano Genre et al. Annamaria Helling. 0037;048(88): 2061-2068  (http://education.[employer].com/faq/FAQ164)     Wt Readings from Last 3 Encounters:  01/03/19 138 lb (62.6 kg)  12/17/18 146 lb 14.4 oz (66.6 kg)  12/06/18 146 lb 9.6 oz (66.5 kg)     Exam:    Vital Signs:  BP 115/63   Pulse 78   Ht _0  (1.549 m)   Wt 138 lb (62.6 kg)   BMI 26.07 kg/m     ASSESSMENT & PLAN:    Coronary artery disease without angina: Ms. Ryland is doing well without recurrent angina.  Exertional dyspnea continues to improve.  We will plan to continue her current medications for secondary prevention, including 12 months of dual antiplatelet therapy from the time of her NSTEMI in 08/2018.  Continue with metoprolol and atorvastatin as well.  Given that cardiac rehab is currently precluded, I encouraged Ms. Vonruden to walk on her own while maintaining social distancing.  Chronic systolic and diastolic heart failure secondary to ischemic cardiomyopathy: Ms. Molner reports improving DOE when climbing stairs, consistent with NYHA class II symptoms.  Her weight continues to decline.  She denies edema.  We will continue with metoprolol tartrate milligrams twice daily.  We will defer adding an ACE inhibitor or ARB at this time, given only mildly reduced LVEF and low normal blood pressure readings at home.  Hyperlipidemia: LDL at goal (less than 70) on last check in 09/2018.  Continue atorvastatin 80 mg daily.  Type 2 diabetes mellitus: Glycemic control improved with weight loss and exercise.  Most recent hemoglobin A1c in 11/2018 was 5.7, prompting de-escalation of diabetes regimen by Olivia Roth.  I encouraged Ms. Rhue to continue her lifestyle modifications.  Further medication changes deferred to Olivia Roth.  Seasonal allergies: Longstanding issue for Ms. Westermeyer, currently making it challenging for her to walk and spend time outdoors.  I suggested she try an OTC antihistamine  such as loratadine, cetirizine, or fexofenadine.  I advised her that these medications can be somewhat sedating and cause urinary retention.  If significant side effects arise, she should contact us or Olivia Roth for further instructions.  COVID-19 Education: The signs and symptoms of COVID-19 were discussed with the patient and how to seek care for testing (follow up with PCP or arrange E-visit).  The importance of social distancing was discussed today.  Patient Risk:   After full review of this patients clinical status, I feel that they are at least moderate risk at this time.  Time:   Today, I have spent 16 minutes  with the patient with telehealth technology discussing her coronary artery disease, seasonal allergies, importance of continued exercise, and COVID-19 precaution.     Medication Adjustments/Labs and Tests Ordered: Current medicines are reviewed at length with the patient today.  Concerns regarding medicines are outlined above.   Tests Ordered: None.  Medication Changes: Consider OTC antihistamine such as loratadine, cetirizine, or fexofenadine.  Disposition:  in 4 month(s)  Signed, Nelva Bush, MD  01/03/2019 8:21 AM    [facility]

## 2019-01-03 ENCOUNTER — Encounter: Payer: Self-pay | Admitting: Internal Medicine

## 2019-01-03 ENCOUNTER — Telehealth (INDEPENDENT_AMBULATORY_CARE_PROVIDER_SITE_OTHER): Payer: 59 | Admitting: Internal Medicine

## 2019-01-03 ENCOUNTER — Other Ambulatory Visit: Payer: Self-pay

## 2019-01-03 DIAGNOSIS — J302 Other seasonal allergic rhinitis: Secondary | ICD-10-CM

## 2019-01-03 DIAGNOSIS — E1159 Type 2 diabetes mellitus with other circulatory complications: Secondary | ICD-10-CM | POA: Diagnosis not present

## 2019-01-03 DIAGNOSIS — E785 Hyperlipidemia, unspecified: Secondary | ICD-10-CM | POA: Diagnosis not present

## 2019-01-03 DIAGNOSIS — I2510 Atherosclerotic heart disease of native coronary artery without angina pectoris: Secondary | ICD-10-CM

## 2019-01-03 DIAGNOSIS — I5042 Chronic combined systolic (congestive) and diastolic (congestive) heart failure: Secondary | ICD-10-CM | POA: Diagnosis not present

## 2019-01-03 NOTE — Patient Instructions (Addendum)
Medication Instructions:  Your physician recommends that you continue on your current medications as directed. Please refer to the Current Medication list given to you today.  Your physician recommends over-the-counter Claritin, Zyrtec or Allegra for seasonal allergies.  If you need a refill on your cardiac medications before your next appointment, please call your pharmacy.   Lab work: none If you have labs (blood work) drawn today and your tests are completely normal, you will receive your results only by: Marland Kitchen MyChart Message (if you have MyChart) OR . A paper copy in the mail If you have any lab test that is abnormal or we need to change your treatment, we will call you to review the results.  Testing/Procedures: none  Follow-Up: At [facility], you and your health needs are our priority.  As part of our continuing mission to provide you with exceptional heart care, we have created designated Provider Care Teams.  These Care Teams include your primary Cardiologist (physician) and Advanced Practice Providers (APPs -  Physician Assistants and Nurse Practitioners) who all work together to provide you with the care you need, when you need it. You will need a follow up appointment in 4 months.  Please call our office 2 months in advance to schedule this appointment.  You may see Yvonne Kendall, MD or one of the following Advanced Practice Providers on your designated Care Team:   Nicolasa Ducking, NP Eula Listen, PA-C  . Marisue Ivan, PA-C

## 2019-01-09 ENCOUNTER — Encounter: Payer: Self-pay | Admitting: *Deleted

## 2019-01-09 DIAGNOSIS — Z951 Presence of aortocoronary bypass graft: Secondary | ICD-10-CM

## 2019-01-29 ENCOUNTER — Encounter: Payer: Self-pay | Admitting: *Deleted

## 2019-01-29 DIAGNOSIS — Z951 Presence of aortocoronary bypass graft: Secondary | ICD-10-CM

## 2019-02-13 DIAGNOSIS — Z951 Presence of aortocoronary bypass graft: Secondary | ICD-10-CM

## 2019-03-05 ENCOUNTER — Other Ambulatory Visit: Payer: Self-pay

## 2019-03-05 ENCOUNTER — Other Ambulatory Visit: Payer: 59

## 2019-03-06 LAB — BASIC METABOLIC PANEL WITH GFR
BUN: 22 mg/dL (ref 7–25)
CO2: 28 mmol/L (ref 20–32)
Calcium: 9.7 mg/dL (ref 8.6–10.4)
Chloride: 106 mmol/L (ref 98–110)
Creat: 0.9 mg/dL (ref 0.50–0.99)
GFR, Est African American: 78 mL/min/{1.73_m2} (ref >=60)
GFR, Est Non African American: 68 mL/min/{1.73_m2} (ref >=60)
Glucose, Bld: 110 mg/dL — ABNORMAL HIGH (ref 65–99)
Potassium: 4.7 mmol/L (ref 3.5–5.3)
Sodium: 143 mmol/L (ref 135–146)

## 2019-03-10 ENCOUNTER — Ambulatory Visit: Payer: 59 | Admitting: Nurse Practitioner

## 2019-03-10 ENCOUNTER — Ambulatory Visit (INDEPENDENT_AMBULATORY_CARE_PROVIDER_SITE_OTHER): Payer: 59 | Admitting: Family Medicine

## 2019-03-10 ENCOUNTER — Encounter: Payer: Self-pay | Admitting: Family Medicine

## 2019-03-10 ENCOUNTER — Other Ambulatory Visit: Payer: Self-pay

## 2019-03-10 DIAGNOSIS — E1169 Type 2 diabetes mellitus with other specified complication: Secondary | ICD-10-CM | POA: Diagnosis not present

## 2019-03-10 NOTE — Assessment & Plan Note (Signed)
Well-controlled DM with A1c 5.7 significantly improved from previous results Resolved hypoglycemia. No hyperglycemia Complications - hyperlipidemia, CAD history of NSTEMI, GERD - increases risk of future cardiovascular complications    Plan:  1. Continue current therapy - Metformin 500mg  x 2 in AM and x 1 in PM - PENDING A1c result this week, may reduce to STOP PM dosing, and only continue Metformin 500mg  x 2 in AM 2. Encourage improved lifestyle - low carb, low sugar diet, reduce portion size, continue improving regular exercise 3. Check CBG , bring log to next visit for review 4. Continue ASA, Statin 5. Follow-up 3 months for DM A1c

## 2019-03-10 NOTE — Patient Instructions (Addendum)
Thank you for coming to the office today.  Last A1c was improved to 5.7 We will check again this week For now, keep on Metformin 500mg  - x 2 in AM and x 1 in PM with meals - AFTER reading A1c we can  Contact you and possibly REDUCE dose, and likely STOP evening dose  Please schedule a Follow-up Appointment to: Return in about 3 months (around 06/10/2019) for DM A1c w/ PCP.  If you have any other questions or concerns, please feel free to call the office or send a message through MyChart. You may also schedule an earlier appointment if necessary.  Additionally, you may be receiving a survey about your experience at our office within a few days to 1 week by e-mail or mail. We value your feedback.  Saralyn Pilar, DO [facility], [place]

## 2019-03-10 NOTE — Progress Notes (Signed)
Subjective:    Patient ID: Olivia Roth, female    DOB: Aug 19, 1956, 63 y.o.   MRN: [id number]  Olivia Roth is a 63 y.o. female presenting on 03/10/2019 for Diabetes  Virtual / Telehealth Encounter - Video Visit via [email or web address] The purpose of this virtual visit is to provide medical care while limiting exposure to the novel coronavirus (COVID19) for both patient and office staff.  Consent was obtained for remote visit:  Yes.   Answered questions that patient had about telehealth interaction:  Yes.   I discussed the limitations, risks, security and privacy concerns of performing an evaluation and management service by video/telephone. I also discussed with the patient that there may be a patient responsible charge related to this service. The patient expressed understanding and agreed to proceed.  Patient Location: Home Provider Location: [facility] (Office)  PCP is Wilhelmina Mcardle, AGPCNP-BC - I am currently covering during her maternity leave.   HPI   CHRONIC DM, Type 2: Reports last visit 12/06/18, she was discontinued off Januvia, and previously off GLimepiride, she was having episodes of hypoglycemia on other medications, during cardiac rehab in past, see prior notes - Last A1c improved to 5.7, now due again for A1c this week CBGs: Avg 100-120, Low (none), High < 170. Checks CBGs 1-2x daily Meds: Metformin 500mg  = x 2 in AM and x 1 in PM Reports good compliance. Tolerating well w/o side-effects Currently on ACEi / ARB Lifestyle: - Weight down to 137 lbs - Diet (Limits carbs in afternoon, prefers carbs in AM, she has improved DM diet, limited portions)  Denies hypoglycemia, polyuria, visual changes, numbness or tingling.   Depression screen [facility] 2/9 03/10/2019 10/24/2018 09/03/2018  Decreased Interest 0 0 1  Down, Depressed, Hopeless 0 0 1  PHQ - 2 Score 0 0 2  Altered sleeping - 0 1  Tired, decreased energy - 0 1  Change in appetite - 0 1  Feeling bad or failure  about yourself  - 0 0  Trouble concentrating - 0 0  Moving slowly or fidgety/restless - 0 0  Suicidal thoughts - 0 0  PHQ-9 Score - 0 5  Difficult doing work/chores - Not difficult at all Not difficult at all    Social History   Tobacco Use  . Smoking status: Never Smoker  . Smokeless tobacco: Never Used  Substance Use Topics  . Alcohol use: Not Currently  . Drug use: Never    Review of Systems Per HPI unless specifically indicated above     Objective:    There were no vitals taken for this visit.  Wt Readings from Last 3 Encounters:  01/03/19 138 lb (62.6 kg)  12/17/18 146 lb 14.4 oz (66.6 kg)  12/06/18 146 lb 9.6 oz (66.5 kg)    Physical Exam   Note examination was completely remotely via video observation objective data only  Gen - well-appearing, no acute distress or apparent pain, comfortable HEENT - eyes appear clear without discharge or redness Heart/Lungs - cannot examine virtually - observed no evidence of coughing or labored breathing. Skin - face visible today- no rash Neuro - awake, alert, oriented Psych - not anxious appearing  Recent Labs    08/21/18 0626 08/23/18 0448 12/06/18 0822  HGBA1C 8.4* 8.5* 5.7*      Results for orders placed or performed in visit on 12/06/18  POCT glycosylated hemoglobin (Hb A1C)  Result Value Ref Range   Hemoglobin A1C 5.7 (A) 4.0 -  5.6 %   HbA1c POC (<> result, manual entry)     HbA1c, POC (prediabetic range)     HbA1c, POC (controlled diabetic range)        Assessment & Plan:   Problem List Items Addressed This Visit    Type 2 diabetes mellitus with other specified complication (HCC) - Primary    Well-controlled DM with A1c 5.7 significantly improved from previous results Resolved hypoglycemia. No hyperglycemia Complications - hyperlipidemia, CAD history of NSTEMI, GERD - increases risk of future cardiovascular complications    Plan:  1. Continue current therapy - Metformin 500mg  x 2 in AM and x 1 in PM  - PENDING A1c result this week, may reduce to STOP PM dosing, and only continue Metformin 500mg  x 2 in AM 2. Encourage improved lifestyle - low carb, low sugar diet, reduce portion size, continue improving regular exercise 3. Check CBG , bring log to next visit for review 4. Continue ASA, Statin 5. Follow-up 3 months for DM A1c       Relevant Orders   Hemoglobin A1c      No orders of the defined types were placed in this encounter.   Follow-up: - Return in 3 months DM A1c w/ PCP Future lab - A1c on 6/3, then will defer labs to 09/2019  Patient verbalizes understanding with the above medical recommendations including the limitation of remote medical advice.  Specific follow-up and call-back criteria were given for patient to follow-up or seek medical care more urgently if needed.  Total duration of direct patient care provided via video conference: 15 minutes   Saralyn PilarAlexander Donnesha Karg, DO [facility] 03/10/2019, 8:51 AM

## 2019-03-12 ENCOUNTER — Other Ambulatory Visit: Payer: 59

## 2019-03-12 ENCOUNTER — Other Ambulatory Visit: Payer: Self-pay

## 2019-03-12 DIAGNOSIS — E1169 Type 2 diabetes mellitus with other specified complication: Secondary | ICD-10-CM

## 2019-03-13 LAB — HEMOGLOBIN A1C
Hgb A1c MFr Bld: 6.4 % of total Hgb — ABNORMAL HIGH (ref <5.7)
Mean Plasma Glucose: 137 (calc)
eAG (mmol/L): 7.6 (calc)

## 2019-03-14 ENCOUNTER — Telehealth: Payer: Self-pay | Admitting: *Deleted

## 2019-03-14 NOTE — Telephone Encounter (Signed)
Called to check in. Last attempt was 5/4 with no answer or reply to email.  She has talked with Melissa on 5/7.  Unable to leave message, sent email.

## 2019-03-22 ENCOUNTER — Other Ambulatory Visit: Payer: Self-pay | Admitting: Nurse Practitioner

## 2019-03-25 ENCOUNTER — Telehealth: Payer: Self-pay

## 2019-03-25 MED ORDER — CLOPIDOGREL BISULFATE 75 MG PO TABS: 75.0000 mg | ORAL_TABLET | Freq: Every day | ORAL | 0 refills | Status: DC

## 2019-03-25 NOTE — Telephone Encounter (Signed)
Requested Prescriptions   Signed Prescriptions Disp Refills   clopidogrel (PLAVIX) 75 MG tablet 90 tablet 0    Sig: Take 1 tablet (75 mg total) by mouth daily.    Authorizing Provider: END, CHRISTOPHER    Ordering User: NEWCOMER MCCLAIN, BRANDY L    

## 2019-03-26 ENCOUNTER — Other Ambulatory Visit: Payer: Self-pay | Admitting: Nurse Practitioner

## 2019-03-26 DIAGNOSIS — E1165 Type 2 diabetes mellitus with hyperglycemia: Secondary | ICD-10-CM

## 2019-03-27 DIAGNOSIS — Z951 Presence of aortocoronary bypass graft: Secondary | ICD-10-CM

## 2019-04-23 ENCOUNTER — Encounter: Payer: Self-pay | Admitting: *Deleted

## 2019-04-23 DIAGNOSIS — Z951 Presence of aortocoronary bypass graft: Secondary | ICD-10-CM

## 2019-04-23 NOTE — Progress Notes (Signed)
Cardiac Individual Treatment Plan  Patient Details  Name: Olivia Roth MRN: [id number] Date of Birth: 10/15/1955 Referring Provider:     Cardiac Rehab from 10/24/2018 in [facility] Cardiac and Pulmonary Rehab  Referring Provider  End      Initial Encounter Date:    Cardiac Rehab from 10/24/2018 in [facility] Cardiac and Pulmonary Rehab  Date  10/24/18      Visit Diagnosis: S/P CABG x 4  Patient's Home Medications on Admission:  Current Outpatient Medications:  .  acetaminophen (TYLENOL) 500 MG tablet, Take 2 tablets (1,000 mg total) by mouth every 6 (six) hours as needed., Disp: 30 tablet, Rfl: 0 .  albuterol (PROVENTIL HFA;VENTOLIN HFA) 108 (90 Base) MCG/ACT inhaler, Inhale 2 puffs into the lungs every 6 (six) hours as needed for wheezing or shortness of breath., Disp: 1 Inhaler, Rfl: 11 .  aspirin EC 81 MG tablet, Take 1 tablet (81 mg total) by mouth daily., Disp: 90 tablet, Rfl: 3 .  atorvastatin (LIPITOR) 80 MG tablet, TAKE 1 TABLET DAILY AT 6 P.M., Disp: 90 tablet, Rfl: 1 .  blood glucose meter kit and supplies KIT, Dispense based on patient and insurance preference. two times daily as directed. For E11.65, Disp: 1 each, Rfl: 0 .  clopidogrel (PLAVIX) 75 MG tablet, Take 1 tablet (75 mg total) by mouth daily., Disp: 90 tablet, Rfl: 0 .  glucose blood test strip, Use as instructed, Disp: 100 each, Rfl: 12 .  Lancets (ONETOUCH ULTRASOFT) lancets, TEST TWICE DAILY, Disp: 100 each, Rfl: 3 .  metFORMIN (GLUCOPHAGE) 500 MG tablet, Take 2 tab in morning with meal, and take 1 tab in evening with meal, Disp: 270 tablet, Rfl: 1 .  metoprolol tartrate (LOPRESSOR) 25 MG tablet, TAKE 1 TABLET TWICE A DAY, Disp: 180 tablet, Rfl: 1  Past Medical History: Past Medical History:  Diagnosis Date  . Anxiety   . Asthma    In cold weather (08/21/2018)  . CAD (coronary artery disease)    a. 08/2018 NSTEMI/Cath: LM 50d,LAD 80ost/p, 87m70d, LCX 90ost, OM1 50, OM3 70, OM4 80, LPDA small, RCA  99p/d, EF 30-35%;  c. 08/2018 CABG x 4: LIMA->LAD, VG->RI, VG->OM, VG->RCA.  .Marland KitchenChronic combined systolic (congestive) and diastolic (congestive) heart failure ([place]    a. 08/2018 Echo: EF 40-45%, mid-apicalanteroseptal and apical HK. Gr2 DD. Mild AI; b. 08/2018 intraop TEE: EF 45-50%, antsept, infsept HK. Mild AI. Mildly dil Ao root. Trace MR.  . Family history of adverse reaction to anesthesia    "mother PONV; talked crazy/loud after anesthesia" (08/21/2018)  . GERD (gastroesophageal reflux disease)   . High cholesterol   . Ischemic cardiomyopathy    a. 08/2018 Echo: EF 40-45%; b. 08/2018 TEE: EF 45-50%.  . Seasonal allergies   . Tuberculosis ~ 1960  . Type II diabetes mellitus ([place]   . UTI (lower urinary tract infection)    "once; before hysterectomy" (08/21/2018)    Tobacco Use: Social History   Tobacco Use  Smoking Status Never Smoker  Smokeless Tobacco Never Used    Labs: Recent Review Flowsheet Data    Labs for ITP Cardiac and Pulmonary Rehab Latest Ref Rng & Units 08/28/2018 08/29/2018 10/04/2018 12/06/2018 03/12/2019   Cholestrol 100 - 199 mg/dL - - 120 - -   LDLCALC 0 - 99 mg/dL - - 50 - -   HDL >39 mg/dL - - 33(L) - -   Trlycerides 0 - 149 mg/dL - - 186(H) - -   Hemoglobin A1c <5.7 %  of total Hgb - - - 5.7(A) 6.4(H)   PHART 7.350 - 7.450 - - - - -   PCO2ART 32.0 - 48.0 mmHg - - - - -   HCO3 20.0 - 28.0 mmol/L - - - - -   TCO2 22 - 32 mmol/L - - - - -   ACIDBASEDEF 0.0 - 2.0 mmol/L - - - - -   O2SAT % 62.2 62.5 - - -       Exercise Target Goals: Exercise Program Goal: Individual exercise prescription set using results from initial 6 min walk test and THRR while considering  patient's activity barriers and safety.   Exercise Prescription Goal: Initial exercise prescription builds to 30-45 minutes a day of aerobic activity, 2-3 days per week.  Home exercise guidelines will be given to patient during program as part of exercise prescription that the participant will  acknowledge.  Activity Barriers & Risk Stratification:   6 Minute Walk: 6 Minute Walk    Row Name 12/17/18 1504         6 Minute Walk   Phase  Discharge     Distance  1460 feet     Distance % Change  12.3 %     Distance Feet Change  160 ft     Walk Time  6 minutes     # of Rest Breaks  0     MPH  2.76     METS  3.2     RPE  12     VO2 Peak  11.23     Symptoms  No     Resting HR  65 bpm     Resting BP  126/62     Max Ex. HR  88 bpm     Max Ex. BP  108/56        Oxygen Initial Assessment:   Oxygen Re-Evaluation:   Oxygen Discharge (Final Oxygen Re-Evaluation):   Initial Exercise Prescription:   Perform Capillary Blood Glucose checks as needed.  Exercise Prescription Changes: Exercise Prescription Changes    Row Name 10/29/18 1400 11/07/18 1000 11/12/18 1400 11/27/18 0900 12/11/18 1100     Response to Exercise   Blood Pressure (Admit)  128/64  -  144/70  104/68  110/58   Blood Pressure (Exercise)  154/70  -  118/70  114/68  132/64   Blood Pressure (Exit)  124/90  -  100/66  98/60  116/58   Heart Rate (Admit)  74 bpm  -  72 bpm  70 bpm  67 bpm   Heart Rate (Exercise)  112 bpm  -  107 bpm  108 bpm  92 bpm   Heart Rate (Exit)  90 bpm  -  82 bpm  82 bpm  71 bpm   Rating of Perceived Exertion (Exercise)  15  -  '13  13  12   '$ Symptoms  none  -  none  none  none   Comments  first full day of exercise  -  -  -  -   Duration  Progress to 30 minutes of  aerobic without signs/symptoms of physical distress  -  Continue with 30 min of aerobic exercise without signs/symptoms of physical distress.  Continue with 30 min of aerobic exercise without signs/symptoms of physical distress.  Continue with 30 min of aerobic exercise without signs/symptoms of physical distress.   Intensity  THRR unchanged  -  THRR unchanged  THRR unchanged  THRR unchanged  Progression   Progression  Continue to progress workloads to maintain intensity without signs/symptoms of physical distress.   -  Continue to progress workloads to maintain intensity without signs/symptoms of physical distress.  Continue to progress workloads to maintain intensity without signs/symptoms of physical distress.  Continue to progress workloads to maintain intensity without signs/symptoms of physical distress.   Average METs  2.54  -  2.73  2.83  2.86     Resistance Training   Training Prescription  Yes  -  Yes  Yes  Yes   Weight  3 lbs  -  3 lbs  3 lbs  4 lbs   Reps  10-15  -  10-15  10-15  10-15     Interval Training   Interval Training  No  -  No  No  No     Treadmill   MPH  2.2  -  2.2  2.2  2.2   Grade  1  -  '1  1  1   '$ Minutes  15  -  '15  15  15   '$ METs  2.99  -  2.99  2.99  2.99     Recumbant Bike   Level  2  -  '3  3  3   '$ Minutes  15  -  '15  15  15   '$ METs  2.1  -  3.12  3  3.1     REL-XR   Level  -  -  '3  3  3   '$ Speed  -  -  -  1  1   Minutes  -  -  '15  15  15   '$ METs  -  -  2.1  2.5  2.5     Home Exercise Plan   Plans to continue exercise at  -  Home (comment) walking  Home (comment) walking  Home (comment) walking  Home (comment) walking   Frequency  -  Add 3 additional days to program exercise sessions.  Add 3 additional days to program exercise sessions.  Add 3 additional days to program exercise sessions.  Add 3 additional days to program exercise sessions.   Initial Home Exercises Provided  -  11/07/18  11/07/18  11/07/18  11/07/18   Row Name 12/25/18 1000             Response to Exercise   Blood Pressure (Admit)  100/60       Blood Pressure (Exercise)  130/64       Blood Pressure (Exit)  106/62       Heart Rate (Admit)  65 bpm       Heart Rate (Exercise)  97 bpm       Heart Rate (Exit)  72 bpm       Rating of Perceived Exertion (Exercise)  12       Symptoms  none       Duration  Continue with 30 min of aerobic exercise without signs/symptoms of physical distress.       Intensity  THRR unchanged         Progression   Progression  Continue to progress workloads to  maintain intensity without signs/symptoms of physical distress.       Average METs  3.3         Resistance Training   Training Prescription  Yes       Weight  4 lbs       Reps  10-15  Interval Training   Interval Training  No         Treadmill   MPH  2.2       Grade  1       Minutes  15       METs  2.99         Recumbant Bike   Level  3       Minutes  15       METs  3.1         REL-XR   Level  3       Minutes  15       METs  3.8         Home Exercise Plan   Plans to continue exercise at  Home (comment) walking       Frequency  Add 3 additional days to program exercise sessions.       Initial Home Exercises Provided  11/07/18          Exercise Comments: Exercise Comments    Row Name 10/29/18 1127           Exercise Comments  First full day of exercise!  Patient was oriented to gym and equipment including functions, settings, policies, and procedures.  Patient's individual exercise prescription and treatment plan were reviewed.  All starting workloads were established based on the results of the 6 minute walk test done at initial orientation visit.  The plan for exercise progression was also introduced and progression will be customized based on patient's performance and goals.          Exercise Goals and Review:   Exercise Goals Re-Evaluation : Exercise Goals Re-Evaluation    Row Name 10/29/18 0938 11/07/18 1021 11/12/18 1010 11/27/18 0949 12/11/18 1145     Exercise Goal Re-Evaluation   Exercise Goals Review  Increase Physical Activity;Increase Strength and Stamina;Able to understand and use rate of perceived exertion (RPE) scale;Knowledge and understanding of Target Heart Rate Range (THRR);Understanding of Exercise Prescription  Increase Physical Activity;Increase Strength and Stamina;Able to understand and use rate of perceived exertion (RPE) scale;Knowledge and understanding of Target Heart Rate Range (THRR);Understanding of Exercise Prescription;Able to  check pulse independently  Increase Physical Activity;Increase Strength and Stamina;Understanding of Exercise Prescription  Increase Physical Activity;Increase Strength and Stamina;Understanding of Exercise Prescription  Increase Physical Activity;Increase Strength and Stamina;Understanding of Exercise Prescription   Comments  Reviewed RPE scale, THR and program prescription with pt today.  Pt voiced understanding and was given a copy of goals to take home.   Reviewed home exercise with pt today.  Pt [place] walk [place] the mall for exercise.  Reviewed THR, pulse, RPE, sign and symptoms, and when to call 911 or MD.  Also discussed weather considerations and indoor options.  Pt voiced understanding.  Enyla is doing well in rehab.  She has started to walk at home.  She made it 20 min yesterday. She is aiming for 5 days of walking each week.    Chizuko continues to do well in rehab.  She is now on level 3 for the XR.  We will continue to monitor her progress.   Nahiara is doing well in rehab. She is on level 3 for the recumbent bike.  We will continue to monitor her progress.    Expected Outcomes  Short: Use RPE daily to regulate intensity. Long: Follow program prescription in THR.  Short: Start to add in more walking at home on off days.  Long: Increase  activity levels at home  Short: Continue to add in more home exercise at home aiming for 30 min.  Long: [employer] for exercise on her own as well.   Short: Increase more workloads. Long: Continue to increase strength and stamina.   Short: Continue to increase workloads.  Long: Continue to improve stamina.    [place] Name 12/25/18 1023 01/09/19 1005 01/29/19 1302         Exercise Goal Re-Evaluation   Exercise Goals Review  Increase Physical Activity;Increase Strength and Stamina;Understanding of Exercise Prescription  Increase Physical Activity;Increase Strength and Stamina;Understanding of Exercise Prescription  Increase Physical Activity;Increase Strength and  Stamina;Understanding of Exercise Prescription     Comments  Ziasia has been doing well in rehab.  She is up to 3.8 METs on the XR.  She will be walking at home.  We will continue to monitor her progress.   Dmya is doing well at home.  She is still getting her walking daily. She has also tried out some low impact aerobics videos on YouTube.  She is going to try out our videos that we sent out this week.  She is enjoying finding different alternatives.   Tarren is doing well with her exercise at home.  The nicer weather and less pollen are making it easier for her to get outside.  She has also been using a few videos for exercise too.  She has plans to start walking with her husband daily when he gets home from work.       Expected Outcomes  Short: Continue walk at home Long: Continue to improve strength and stamina.   Short: Continue to exercise daily.  Long: Continue to increase strenght and stamina.   Short: Start walking more.  Long:  Continue to work on increasing activity.         Discharge Exercise Prescription (Final Exercise Prescription Changes): Exercise Prescription Changes - 12/25/18 1000      Response to Exercise   Blood Pressure (Admit)  100/60    Blood Pressure (Exercise)  130/64    Blood Pressure (Exit)  106/62    Heart Rate (Admit)  65 bpm    Heart Rate (Exercise)  97 bpm    Heart Rate (Exit)  72 bpm    Rating of Perceived Exertion (Exercise)  12    Symptoms  none    Duration  Continue with 30 min of aerobic exercise without signs/symptoms of physical distress.    Intensity  THRR unchanged      Progression   Progression  Continue to progress workloads to maintain intensity without signs/symptoms of physical distress.    Average METs  3.3      Resistance Training   Training Prescription  Yes    Weight  4 lbs    Reps  10-15      Interval Training   Interval Training  No      Treadmill   MPH  2.2    Grade  1    Minutes  15    METs  2.99      Recumbant Bike   Level   3    Minutes  15    METs  3.1      REL-XR   Level  3    Minutes  15    METs  3.8      Home Exercise Plan   Plans to continue exercise at  Home (comment)   walking   Frequency  Add  3 additional days to program exercise sessions.    Initial Home Exercises Provided  11/07/18       Nutrition:  Target Goals: Understanding of nutrition guidelines, daily intake of sodium '1500mg'$ , cholesterol '200mg'$ , calories 30% from fat and 7% or less from saturated fats, daily to have 5 or more servings of fruits and vegetables.  Biometrics:  Post Biometrics - 12/17/18 1505       Post  Biometrics   Height  5' 1.75" (1.568 m)    Weight  146 lb 14.4 oz (66.6 kg)    Waist Circumference  32.5 inches    Hip Circumference  37.5 inches    Waist to Hip Ratio  0.87 %    BMI (Calculated)  27.1    Single Leg Stand  26.51 seconds       Nutrition Therapy Plan and Nutrition Goals: Nutrition Therapy & Goals - 02/13/19 1404      Nutrition Therapy   Diet  Diabetic, low sodium, heart healthy Diet. A1C now 5.17    Protein (specify units)  50g    Fiber  25 grams    Whole Grain Foods  3 servings    Saturated Fats  12 max. grams    Fruits and Vegetables  5 servings/day    Sodium  1.5 grams      Personal Nutrition Goals   Nutrition Goal  ST: continue healthy eating at home during COVID LT: 130lb weight loss goal made with PCP    Comments  watching what she is eating since rehab, lost some weight (140 -->138lbs, started at 151lbs), but not as strict since being home due to [place]. Moving down on diabetes medication. BG readings 110 -120 ish. B: bowl of cheerios (yogurt oikos) or oatmeal (1/2 apple)  and coffee and will sometimes have eggs and toast. Lunch tuna, little bit of mayo and some crackers and water. D: beets, lean pork some beans and rice (small amount). Grains make BG go up at night when eaten at dinner, has "heavy carbs" before 5pm. Changes done: 99% of food is unsalted (watches sodium), goes for  walks, drink skim milk, diet soad and water. -- changed whole lifestyle., plans meals now -- not super strcit and flexible (i.e. sustainable).  Discussed healthy weightloss and mindful eating while at home, provided support.        Nutrition Assessments:   Nutrition Goals Re-Evaluation: Nutrition Goals Re-Evaluation    Row Name 11/12/18 1014 12/12/18 1016 03/27/19 1132         Goals   Current Weight  151 lb (68.5 kg)  148 lb (67.1 kg)  137 lb (62.1 kg)     Nutrition Goal  Follow heart healthy diet  Get A1C even lower   ST: continue HH eating and moving more, LT: 130 lbs     Comment  Onesty has really been working on her diet.  She is making a lifestyle change and rearranged her eating and her whole house has changed too as she does not want to cook twice.  She is eating more salads and fruits and vegetables.  She has changed to fish and chicken more.  She has stopped adding in salt.  She is making sure she buys the no salt added vegetables.  She is using garlic and vinegar for flavoring.  She does not like the Mrs. Deliah Boston and will use really food to season instead.  She has been working to get enough water and count her carbs to balance  her blood sugars more.   Her doctor took her off a few medications and now she is taking metformen. Her A1C was 5.7. Her doctor states that if she keeps working hard at her diet an exercise she may be able to taper down her prescription dose.  147lbs when started rehab, lost 10lbs since. HgA1c 6.7 (a little higher than it was but still normal - discussed how stress could play a role in increasing BG as well as lack of exercise). Pt reports that she hasn't been exercising, but plans to start up soon and now she is working again so she will move more. Continues HH eating with no rpeorted barriers.     Expected Outcome  Short:  Continue to follow new diet plan.  Long: Continue to keep close eye on blood sugars with new diet.   Short:  Continue to follow a low sugar diet.   Long: Lower blood sugar medications.  Continue HH eating, increase PA and manage BG        Nutrition Goals Discharge (Final Nutrition Goals Re-Evaluation): Nutrition Goals Re-Evaluation - 03/27/19 1132      Goals   Current Weight  137 lb (62.1 kg)    Nutrition Goal  ST: continue HH eating and moving more, LT: 130 lbs    Comment  147lbs when started rehab, lost 10lbs since. HgA1c 6.7 (a little higher than it was but still normal - discussed how stress could play a role in increasing BG as well as lack of exercise). Pt reports that she hasn't been exercising, but plans to start up soon and now she is working again so she will move more. Continues HH eating with no rpeorted barriers.    Expected Outcome  Continue HH eating, increase PA and manage BG       Psychosocial: Target Goals: Acknowledge presence or absence of significant depression and/or stress, maximize coping skills, provide positive support system. Participant is able to verbalize types and ability to use techniques and skills needed for reducing stress and depression.   Initial Review & Psychosocial Screening:   Quality of Life Scores:   Scores of 19 and below usually indicate a poorer quality of life in these areas.  A difference of  2-3 points is a clinically meaningful difference.  A difference of 2-3 points in the total score of the Quality of Life Index has been associated with significant improvement in overall quality of life, self-image, physical symptoms, and general health in studies assessing change in quality of life.  PHQ-9: Recent Review Flowsheet Data    Depression screen [facility] 2/9 03/10/2019 10/24/2018 09/03/2018 07/04/2017 10/06/2015   Decreased Interest 0 0 1 0 0   Down, Depressed, Hopeless 0 0 1 0 0   PHQ - 2 Score 0 0 2 0 0   Altered sleeping - 0 1 - -   Tired, decreased energy - 0 1 - -   Change in appetite - 0 1 - -   Feeling bad or failure about yourself  - 0 0 - -   Trouble concentrating - 0 0 - -    Moving slowly or fidgety/restless - 0 0 - -   Suicidal thoughts - 0 0 - -   PHQ-9 Score - 0 5 - -   Difficult doing work/chores - Not difficult at all Not difficult at all - -     Interpretation of Total Score  Total Score Depression Severity:  1-4 = Minimal depression, 5-9 = Mild  depression, 10-14 = Moderate depression, 15-19 = Moderately severe depression, 20-27 = Severe depression   Psychosocial Evaluation and Intervention: Psychosocial Evaluation - 10/29/18 1027      Psychosocial Evaluation & Interventions   Interventions  Encouraged to exercise with the program and follow exercise prescription;Stress management education    Comments  Counselor met with Ms. Kobler Verdis Frederickson) today for initial psychsocial evaluation.  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  Akire has a strong support system with a spouse; (2) children; a host of friends and coworkers and active involvement in her local church.  She sleeps well and has a great appetite.  She has lost 15-20 pounds since her procedure and is hoping to decrease her medications for diabetes if she continues to lose weight.  She reports a history of anxiety 10-15 years ago and some minor symptoms currently but no symptoms of depression.  Madysen is typically in a positive mood.  Her stress currently is "finding her new normal" and going back to work in February.  She has goals to lose more weight and just generally be healthier.  Staff will follow with Verdis Frederickson throughout the course of this program.     Expected Outcomes  Short:  Jailene will meet with the dietician to address her weight loss goal.  She will also consistently exercise to build her stamina and strength and develop a routine of healthy lifestyle.  Long:  Rosmery will practice a healthier lifestyle and find her new norm.    Continue Psychosocial Services   Follow up required by staff       Psychosocial Re-Evaluation: Psychosocial Re-Evaluation    [place] Name 11/12/18 1011  12/12/18 1013 01/09/19 1006 01/29/19 1308       Psychosocial Re-Evaluation   Current issues with  Current Stress Concerns  Current Stress Concerns  -  Current Stress Concerns    Comments  Jamarria is doing well in rehab.  She is continuing to get back to her new normal. She is planning to go back to work on 2/18.  She is meeting with her boss today and will talk about her options for finishing rehab.  She continues to sleep well and has a good support system.   Twanna is back to work and is happy to be there. Sometimes she has stress at work but is able to manage it. She does not let things at work stress her out like they used to. She stops working around [place] and does not answer emails and it keeps her more grounded.  Lynee is doing well mentally.  She had a good follow up visit with her cardiologist over the phone on Friday.  He said that she was doing good and continued to encourage her to exercise. She continues to work from home as she can. 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  She enjoys staying connected with her kids.  She is ready for all of this to be over and get back to school!  Tyneisha has been doing well mentally.  She is sleeping good and doing okay with work. 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  Her biggest concern was a bruise that has suddenly popped up on her arm.  As of now, it is still soft and she is keeping an eye on it.  I encouraged her to call the doctor if she devlops a nodule or it  gets bigger.      Expected Outcomes  Short: Talk to boss about returning to rehab.  Long: Continue to stay positive and take care of self.   Short: continue stress relief by puting time restraints on work. Long: keep stress to a minimum at work.  Short: Continue to connect with teachers and kids via internet.  Long: Continue to cope positively!  Short: Continue to connect with kids and watch bruise.  Long: Continue to stay posite.      Interventions  -  Encouraged to attend Cardiac Rehabilitation for the exercise  -  Encouraged to attend Cardiac Rehabilitation for the exercise    Continue Psychosocial Services   -  Follow up required by staff  -  Follow up required by staff       Psychosocial Discharge (Final Psychosocial Re-Evaluation): Psychosocial Re-Evaluation - 01/29/19 1308      Psychosocial Re-Evaluation   Current issues with  Current Stress Concerns    Comments  Jrue has been doing well mentally.  She is sleeping good and doing okay with work. 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  Her biggest concern was a bruise that has suddenly popped up on her arm.  As of now, it is still soft and she is keeping an eye on it.  I encouraged her to call the doctor if she devlops a nodule or it gets bigger.      Expected Outcomes  Short: Continue to connect with kids and watch bruise.  Long: Continue to stay posite.     Interventions  Encouraged to attend Cardiac Rehabilitation for the exercise    Continue Psychosocial Services   Follow up required by staff       Vocational Rehabilitation: Provide vocational rehab assistance to qualifying candidates.   Vocational Rehab Evaluation & Intervention:   Education: Education Goals: Education classes will be provided on a variety of topics geared toward better understanding of heart health and risk factor modification. Participant will state understanding/return demonstration of topics presented as noted by education test scores.  Learning Barriers/Preferences:   Education Topics:  AED/CPR: - Group verbal and written instruction with the use of models to demonstrate the basic use of the AED with the basic ABC's of resuscitation.   Cardiac Rehab from 12/19/2018 in [facility] Cardiac and Pulmonary Rehab  Date  12/03/18  Educator  SB  Instruction Review Code  1- Verbalizes Understanding      General Nutrition Guidelines/Fats and  Fiber: -Group instruction provided by verbal, written material, models and posters to present the general guidelines for heart healthy nutrition. Gives an explanation and review of dietary fats and fiber.   Cardiac Rehab from 12/19/2018 in [facility] Cardiac and Pulmonary Rehab  Date  12/17/18  Educator  [facility]  Instruction Review Code  1- Verbalizes Understanding      Controlling Sodium/Reading Food Labels: -Group verbal and written material supporting the discussion of sodium use in heart healthy nutrition. Review and explanation with models, verbal and written materials for utilization of the food label.   Cardiac Rehab from 12/19/2018 in [facility] Cardiac and Pulmonary Rehab  Date  12/19/18  Educator  [facility]  Instruction Review Code  1- Verbalizes Understanding      Exercise Physiology & General Exercise Guidelines: - Group verbal and written instruction with models to review the exercise physiology of the cardiovascular system and associated critical values. Provides general exercise guidelines with specific guidelines to those with heart  or lung disease.    Cardiac Rehab from 12/19/2018 in [facility] Cardiac and Pulmonary Rehab  Date  10/29/18  Educator  [facility]  Instruction Review Code  1- Verbalizes Understanding      Aerobic Exercise & Resistance Training: - Gives group verbal and written instruction on the various components of exercise. Focuses on aerobic and resistive training programs and the benefits of this training and how to safely progress through these programs..   Cardiac Rehab from 12/19/2018 in [facility] Cardiac and Pulmonary Rehab  Date  10/31/18  Educator  [place]  Instruction Review Code  1- Verbalizes Understanding      Flexibility, Balance, Mind/Body Relaxation: Provides group verbal/written instruction on the benefits of flexibility and balance training, including mind/body exercise modes such as yoga, pilates and tai chi.  Demonstration and skill practice provided.   Cardiac Rehab from  12/19/2018 in [facility] Cardiac and Pulmonary Rehab  Date  11/05/18  Educator  AS  Instruction Review Code  1- Verbalizes Understanding      Stress and Anxiety: - Provides group verbal and written instruction about the health risks of elevated stress and causes of high stress.  Discuss the correlation between heart/lung disease and anxiety and treatment options. Review healthy ways to manage with stress and anxiety.   Cardiac Rehab from 12/19/2018 in [facility] Cardiac and Pulmonary Rehab  Date  11/26/18  Educator  [facility]  Instruction Review Code  1- Verbalizes Understanding      Depression: - Provides group verbal and written instruction on the correlation between heart/lung disease and depressed mood, treatment options, and the stigmas associated with seeking treatment.   Cardiac Rehab from 12/19/2018 in [facility] Cardiac and Pulmonary Rehab  Date  12/10/18  Educator  [facility]  Instruction Review Code  1- Verbalizes Understanding      Anatomy & Physiology of the Heart: - Group verbal and written instruction and models provide basic cardiac anatomy and physiology, with the coronary electrical and arterial systems. Review of Valvular disease and Heart Failure   Cardiac Rehab from 12/19/2018 in [facility] Cardiac and Pulmonary Rehab  Date  11/14/18  Educator  CE  Instruction Review Code  1- Verbalizes Understanding      Cardiac Procedures: - Group verbal and written instruction to review commonly prescribed medications for heart disease. Reviews the medication, class of the drug, and side effects. Includes the steps to properly store meds and maintain the prescription regimen. (beta blockers and nitrates)   Cardiac Rehab from 12/19/2018 in [facility] Cardiac and Pulmonary Rehab  Date  11/28/18  Educator  CE  Instruction Review Code  1- Verbalizes Understanding      Cardiac Medications I: - Group verbal and written instruction to review commonly prescribed medications for heart disease. Reviews the medication, class  of the drug, and side effects. Includes the steps to properly store meds and maintain the prescription regimen.   Cardiac Rehab from 12/19/2018 in [facility] Cardiac and Pulmonary Rehab  Date  11/19/18  Educator  SB  Instruction Review Code  1- Verbalizes Understanding      Cardiac Medications II: -Group verbal and written instruction to review commonly prescribed medications for heart disease. Reviews the medication, class of the drug, and side effects. (all other drug classes)   Cardiac Rehab from 12/19/2018 in [facility] Cardiac and Pulmonary Rehab  Date  12/12/18  Educator  CE  Instruction Review Code  1- Verbalizes Understanding       Go Sex-Intimacy & Heart Disease, Get SMART - Goal Setting: -  Group verbal and written instruction through game format to discuss heart disease and the return to sexual intimacy. Provides group verbal and written material to discuss and apply goal setting through the application of the S.M.A.R.T. Method.   Cardiac Rehab from 12/19/2018 in [facility] Cardiac and Pulmonary Rehab  Date  11/28/18  Educator  CE  Instruction Review Code  1- Verbalizes Understanding      Other Matters of the Heart: - Provides group verbal, written materials and models to describe Stable Angina and Peripheral Artery. Includes description of the disease process and treatment options available to the cardiac patient.   Exercise & Equipment Safety: - Individual verbal instruction and demonstration of equipment use and safety with use of the equipment.   Cardiac Rehab from 12/19/2018 in [facility] Cardiac and Pulmonary Rehab  Date  10/24/18  Educator  [facility]  Instruction Review Code  1- Verbalizes Understanding      Infection Prevention: - Provides verbal and written material to individual with discussion of infection control including proper hand washing and proper equipment cleaning during exercise session.   Cardiac Rehab from 12/19/2018 in [facility] Cardiac and Pulmonary Rehab  Date  10/24/18   Educator  [facility]  Instruction Review Code  1- Verbalizes Understanding      Falls Prevention: - Provides verbal and written material to individual with discussion of falls prevention and safety.   Cardiac Rehab from 12/19/2018 in [facility] Cardiac and Pulmonary Rehab  Date  10/24/18  Educator  [facility]  Instruction Review Code  1- Verbalizes Understanding      Diabetes: - Individual verbal and written instruction to review signs/symptoms of diabetes, desired ranges of glucose level fasting, after meals and with exercise. Acknowledge that pre and post exercise glucose checks will be done for 3 sessions at entry of program.   Know Your Numbers and Risk Factors: -Group verbal and written instruction about important numbers in your health.  Discussion of what are risk factors and how they play a role in the disease process.  Review of Cholesterol, Blood Pressure, Diabetes, and BMI and the role they play in your overall health.   Cardiac Rehab from 12/19/2018 in [facility] Cardiac and Pulmonary Rehab  Date  12/12/18  Educator  CE  Instruction Review Code  1- Verbalizes Understanding      Sleep Hygiene: -Provides group verbal and written instruction about how sleep can affect your health.  Define sleep hygiene, discuss sleep cycles and impact of sleep habits. Review good sleep hygiene tips.    Cardiac Rehab from 12/19/2018 in [facility] Cardiac and Pulmonary Rehab  Date  11/12/18  Educator  [facility]  Instruction Review Code  1- Verbalizes Understanding      Other: -Provides group and verbal instruction on various topics (see comments)   Knowledge Questionnaire Score:   Core Components/Risk Factors/Patient Goals at Admission:   Core Components/Risk Factors/Patient Goals Review:  Goals and Risk Factor Review    Row Name 11/12/18 1018 12/12/18 1021 01/09/19 1010 01/29/19 1314       Core Components/Risk Factors/Patient Goals Review   Personal Goals Review  Weight  Management/Obesity;Diabetes;Hypertension;Lipids  Weight Management/Obesity;Diabetes;Hypertension;Lipids  Weight Management/Obesity;Diabetes;Hypertension;Lipids  Weight Management/Obesity;Diabetes;Hypertension;Lipids    Review  Airica is doing well in rehab. She sees her diabetes doctor in two weeks and they are looking at changing up her medication again.  She has been monitoring her blood sugars twice a day and when symptomatic.  Her weight is steadily going down and she weighs daily. Her blood pressures have  been good too and she checks daily as well.  Currently, she feels that her meds are working well for  her.   Last time she saw the cardiologist her cholesterol has come down. She is still working on her diet to help lower her cholesterol. She is taking lipitor before 6pm. Her doctor states with exercise that she should be able to get her cholesterol to normal levels. Her LDL is still a little high but will be getting checked at the end of the month.  Arrionna is doing well at home.  She had a televisit with her cardiologist and got a good report.  Her weight is down to 138 lbs as she is really watching what she is eating.  Her pressures have been good it was 100/69 for her appointment.  She continues to check her blood check her sugars daily and they have all been under '115mg'$ /dl!!  She is please with her progress and eager to get back to class!  Tyronda continues to do well at home.  She is still losing some weight as she continues to watch what she is eating.  She continues to get good readings on her blood pressures and she said that her blood sugars are doing excellent!  She was excited for this change and hopes to keep it up.     Expected Outcomes  Short: Continue to weight daily and monitor sugars closely as well adjust to new routine.  Long: Continue to monitor risk factors.   Short: continue to exercise to lower cholesterol.  Long: lower LDL.  Short: Continue to keep weight down.  Long; Continue to monitor  risk factors.   Short: Continue to maintain weight loss.  Long: Continue to monitor risk factors.        Core Components/Risk Factors/Patient Goals at Discharge (Final Review):  Goals and Risk Factor Review - 01/29/19 1314      Core Components/Risk Factors/Patient Goals Review   Personal Goals Review  Weight Management/Obesity;Diabetes;Hypertension;Lipids    Review  Jazariah continues to do well at home.  She is still losing some weight as she continues to watch what she is eating.  She continues to get good readings on her blood pressures and she said that her blood sugars are doing excellent!  She was excited for this change and hopes to keep it up.     Expected Outcomes  Short: Continue to maintain weight loss.  Long: Continue to monitor risk factors.        ITP Comments: ITP Comments    Row Name 10/29/18 1416 11/06/18 0945 12/04/18 0607 12/25/18 1023 01/01/19 1213   ITP Comments  After exercise, Windi's blood sugar dropped to 62, then to 59 after 4 glucose tabs.  She was then given 3 peppermints for carb source.   She was then up to 69.  She ate crackers and an apple and was able to recover to 79.  She was planning to call her doctor today about her sugars.  We will continue to monitor her blood sugars.   30 Day Review. Continue with ITP unless directed changes per Medical Director review.  30 day review. Continue with ITP unless directed changes by Medical Director chart review.  Our program is currently closed due to COVID-19.  We are communicating with patient via phone calls and emails.    30 day review. Continue with ITP unless directed changes by Medical Director chart review.   [place] Name 04/23/19 1328  ITP Comments  30 day review cycle restarting  after being closed since March 16 because of  Covid 19 pandemic. Program opened to patients on July 6. Not all have returned. ITP updated and sent to Medical Director for review,changes as needed and signature          Comments:

## 2019-05-01 ENCOUNTER — Telehealth: Payer: Self-pay

## 2019-05-01 ENCOUNTER — Other Ambulatory Visit: Payer: Self-pay

## 2019-05-01 ENCOUNTER — Encounter: Payer: Self-pay | Admitting: Emergency Medicine

## 2019-05-01 ENCOUNTER — Ambulatory Visit
Admission: EM | Admit: 2019-05-01 | Discharge: 2019-05-01 | Disposition: A | Discharge status: Home / Self Care | Payer: 59 | Attending: Urgent Care | Admitting: Urgent Care

## 2019-05-01 DIAGNOSIS — Z20822 Contact with and (suspected) exposure to covid-19: Secondary | ICD-10-CM

## 2019-05-01 DIAGNOSIS — Z20828 Contact with and (suspected) exposure to other viral communicable diseases: Secondary | ICD-10-CM

## 2019-05-01 NOTE — Telephone Encounter (Signed)
Patient already had test done at [facility] location and notified that it will take 7-10 days to get the result.

## 2019-05-01 NOTE — Telephone Encounter (Signed)
Patients husband tested positive for [place] and she would like a order to be tested.  She is not having any symptoms.

## 2019-05-01 NOTE — ED Triage Notes (Signed)
Pt here after exposure to husband, tested on Tuesday and resulted positive yesterday. Denies any symptoms.

## 2019-05-01 NOTE — Telephone Encounter (Signed)
Order for testing has been placed.  Please provide her with information about the testing sites.  [facility] Testing Information  I have placed an order in the [facility] system.  All you need to do is arrive at a testing site. No appointment needed.  Hours (Open 8 a.m. - 3:45 p.m.) LAST TEST completed at 3:30pm  [place]: [facility] at [facility], [address] [place], [place]: [place], [place], [place], [place] (entrance off [address])  [place]: [place]. Main [address], [place], [place] (across from [facility])  Test result may take 2-7 days to result. You will be notified by MyChart or by Phone.  Phone: [phone] [facility] contact, can inquire about status of test result)

## 2019-05-01 NOTE — Discharge Instructions (Addendum)
It was very nice seeing you today in clinic. Thank you for entrusting me with your care.   You were tested today for SARS-CoV-2 (novel coronavirus). The testing generally takes 5-7 days to result. We will call you with (+) results. Results will also be sent to you via MyChart. Please remain at home, per [facility] DHHS guidelines, until negative results received.   Make arrangements to follow up with your regular doctor if you develop any symptoms. Please remember, our [facility] providers are "right here with you" when you need [place].   Again, it was my pleasure to take care of you today. Thank you for choosing our clinic. I hope that you start to feel better quickly.   Honor Loh, MSN, APRN, FNP-C, CEN Advanced Practice Provider [facility]

## 2019-05-01 NOTE — ED Provider Notes (Signed)
[place], [place]   Name: Olivia Roth DOB: 1956-07-15 MRN: [id number] CSN: [id number] PCP: Mikey College, NP  Arrival date and time:  05/01/19 1506  Chief Complaint:  COVID test   NOTE: Prior to seeing the patient today, I have reviewed the triage nursing documentation and vital signs. Clinical staff has updated patient's PMH/PSHx, current medication list, and drug allergies/intolerances to ensure comprehensive history available to assist in medical decision making.   History:   HPI: Olivia Roth is a 63 y.o. female who presents today with complaints of of recent direct exposure to SARS-CoV-2 (novel coronavirus). Patient advising that her husband was tested over a week ago out of precaution only; he was asymptomatic. Her husband's testing resulted (+) yesterday (04/30/2019). Patient presents today with no symptoms; no cough, fevers, or other symptoms commonly associated with SARS-CoV-2. He advises that she feels generally well. Patient presents for testing out of concern for her personal health as she underwent open heart surgery in 08/2018.   Past Medical History:  Diagnosis Date  . Anxiety   . Asthma    In cold weather (08/21/2018)  . CAD (coronary artery disease)    a. 08/2018 NSTEMI/Cath: LM 50d,LAD 80ost/p, 7m70d, LCX 90ost, OM1 50, OM3 70, OM4 80, LPDA small, RCA  99p/d, EF 30-35%; c. 08/2018 CABG x 4: LIMA->LAD, VG->RI, VG->OM, VG->RCA.  .Marland KitchenChronic combined systolic (congestive) and diastolic (congestive) heart failure ([place]    a. 08/2018 Echo: EF 40-45%, mid-apicalanteroseptal and apical HK. Gr2 DD. Mild AI; b. 08/2018 intraop TEE: EF 45-50%, antsept, infsept HK. Mild AI. Mildly dil Ao root. Trace MR.  . Family history of adverse reaction to anesthesia    "mother PONV; talked crazy/loud after anesthesia" (08/21/2018)  . GERD (gastroesophageal reflux disease)   . High cholesterol   . Ischemic cardiomyopathy    a. 08/2018 Echo: EF 40-45%; b. 08/2018 TEE: EF 45-50%.  .  Seasonal allergies   . Tuberculosis ~ 1960  . Type II diabetes mellitus ([place]   . UTI (lower urinary tract infection)    "once; before hysterectomy" (08/21/2018)    Past Surgical History:  Procedure Laterality Date  . CORONARY ARTERY BYPASS GRAFT N/A 08/26/2018   Procedure: CORONARY ARTERY BYPASS GRAFTING (CABG) times four using left internal mammary artery and right leg saphenous vein grafts;  Surgeon: VIvin Poot MD;  [place] [place]  Service: Open Heart Surgery;  Laterality: N/A;  . GANGLION CYST EXCISION Left ~ 1960   neck  . LEFT HEART CATH AND CORONARY ANGIOGRAPHY N/A 08/21/2018   Procedure: LEFT HEART CATH AND CORONARY ANGIOGRAPHY;  Surgeon: ENelva Bush MD;  [facility];  Service: Cardiovascular;  Laterality: N/A;  . TEE WITHOUT CARDIOVERSION N/A 08/26/2018   Procedure: TRANSESOPHAGEAL ECHOCARDIOGRAM (TEE);  Surgeon: VPrescott Gum PCollier Salina MD;  [place]  Service: Open Heart Surgery;  Laterality: N/A;  . TOTAL ABDOMINAL HYSTERECTOMY  2006    Family History  Problem Relation Age of Onset  . Cancer Sister   . Heart disease Neg Hx     Social History   Tobacco Use  . Smoking status: Never Smoker  . Smokeless tobacco: Never Used  Substance Use Topics  . Alcohol use: Not Currently  . Drug use: Never    Patient Active Problem List   Diagnosis Date Noted  . Hx of CABG 08/26/2018  . Coronary artery disease 08/26/2018  . Coronary artery disease involving native heart with angina pectoris ([place]   .  Chest pain 08/21/2018  . History of non-ST elevation myocardial infarction (NSTEMI) 08/21/2018  . Hyperlipidemia associated with type 2 diabetes mellitus ([place]) 07/20/2016  . Hypertension 07/20/2016  . Asthma 07/05/2016  . Tachycardia 07/05/2016  . Routine general medical examination at a health care facility 06/16/2013  . Screen for colon cancer 06/16/2013  . Screening for breast cancer 06/16/2013  . Type 2 diabetes mellitus with other  specified complication ([place]) 81/85/6314    Home Medications:    Current Meds  Medication Sig  . albuterol (PROVENTIL HFA;VENTOLIN HFA) 108 (90 Base) MCG/ACT inhaler Inhale 2 puffs into the lungs every 6 (six) hours as needed for wheezing or shortness of breath.  Marland Kitchen aspirin EC 81 MG tablet Take 1 tablet (81 mg total) by mouth daily.  Marland Kitchen atorvastatin (LIPITOR) 80 MG tablet TAKE 1 TABLET DAILY AT 6 P.M.  . blood glucose meter kit and supplies KIT Dispense based on patient and insurance preference. two times daily as directed. For E11.65  . clopidogrel (PLAVIX) 75 MG tablet Take 1 tablet (75 mg total) by mouth daily.  Marland Kitchen glucose blood test strip Use as instructed  . Lancets (ONETOUCH ULTRASOFT) lancets TEST TWICE DAILY  . metFORMIN (GLUCOPHAGE) 500 MG tablet Take 2 tab in morning with meal, and take 1 tab in evening with meal  . metoprolol tartrate (LOPRESSOR) 25 MG tablet TAKE 1 TABLET TWICE A DAY    Allergies:   Shellfish allergy, Avocado, Advair diskus [fluticasone-salmeterol], Cortisone, Pecan nut (diagnostic), and Salmon [fish allergy]  Review of Systems (ROS): Review of Systems  Constitutional: Negative for fatigue and fever.  HENT: Negative for congestion, ear pain, postnasal drip, rhinorrhea, sinus pressure, sinus pain, sneezing and sore throat.   Eyes: Negative for pain, discharge and redness.  Respiratory: Negative for cough, chest tightness and shortness of breath.   Cardiovascular: Negative for chest pain and palpitations.  Gastrointestinal: Negative for abdominal pain, diarrhea, nausea and vomiting.  Musculoskeletal: Negative for arthralgias, back pain, myalgias and neck pain.  Skin: Negative for color change, pallor and rash.  Neurological: Negative for dizziness, syncope, weakness and headaches.  Hematological: Negative for adenopathy.     Vital Signs: Today's Vitals   05/01/19 1522 05/01/19 1537  BP: (!) 159/68   Pulse: 72   Resp: 16   Temp: 98.4 F (36.9 C)    TempSrc: Oral   SpO2: 100%   Weight: 139 lb (63 kg)   Height: '5\' 1"'  (1.549 m)   PainSc: 0-No pain 0-No pain    Physical Exam: Physical Exam  Constitutional: She is oriented to person, place, and time and well-developed, well-nourished, and in no distress. No distress.  HENT:  Head: Normocephalic and atraumatic.  Nose: Nose normal.  Mouth/Throat: Oropharynx is clear and moist.  Eyes: Pupils are equal, round, and reactive to light. Conjunctivae and EOM are normal.  Neck: Normal range of motion. Neck supple. No tracheal deviation present.  Cardiovascular: Normal rate, regular rhythm, normal heart sounds and intact distal pulses. Exam reveals no gallop and no friction rub.  No murmur heard. Pulmonary/Chest: Effort normal and breath sounds normal. No respiratory distress. She has no wheezes. She has no rales.  Abdominal: Soft. Bowel sounds are normal. She exhibits no distension. There is no abdominal tenderness.  Musculoskeletal: Normal range of motion.  Lymphadenopathy:    She has no cervical adenopathy.  Neurological: She is alert and oriented to person, place, and time. Gait normal. GCS score is 15.  Skin: Skin is warm and dry.  No rash noted. She is not diaphoretic.  Psychiatric: Mood, memory, affect and judgment normal.    Urgent Care Treatments / Results:   LABS: PLEASE NOTE: all labs that were ordered this encounter are listed, however only abnormal results are displayed. Labs Reviewed  NOVEL CORONAVIRUS, NAA (HOSPITAL ORDER, SEND-OUT TO REF LAB)    EKG: -None  RADIOLOGY: No results found.  PROCEDURES: Procedures  MEDICATIONS RECEIVED THIS VISIT: Medications - No data to display  PERTINENT CLINICAL COURSE NOTES/UPDATES:   Initial Impression / Assessment and Plan / Urgent Care Course:  Pertinent labs & imaging results that were available during my care of the patient were personally reviewed by me and considered in my medical decision making (see lab/imaging  section of note for values and interpretations).  Olivia Roth is a 63 y.o. female who presents to [facility] today with requests for COVID test  Patient overall well appearing and in no acute distress today in clinic. Presenting symptoms (see HPI) and exam as documented above. She presents following a direct exposure to SARS-CoV-2 (novel coronavirus). Discussed typical symptom constellation. Reviewed potential for infection with recent close contact. Given exposure and potential for infection, testing is reasonable. Patient collected SARS-CoV-2 via facility approved self swab process today under the supervision of certified clinical staff. Discussed variable turn around times associated with testing, as swabs are being processed at [facility], and have been taking as long as 7 days. She was advised to self quarantine, per [facility] DHHS guidelines, until negative results received.    Discussed follow up with primary care physician should she develop any concerning symptoms. I have reviewed the follow up and strict return precautions for any new or worsening symptoms. Patient is aware of symptoms that would be deemed urgent/emergent, and would thus require further evaluation either here or in the emergency department. At the time of discharge, he verbalized understanding and consent with the discharge plan as it was reviewed with him. All questions were fielded by provider and/or clinic staff prior to patient discharge.     Final Clinical Impressions / Urgent Care Diagnoses:   Final diagnoses:  Close Exposure to Covid-19 Virus    New Prescriptions:  [place] Controlled Substance Registry consulted? Not Applicable  No orders of the defined types were placed in this encounter.   Recommended Follow up Care:  Patient encouraged to follow up with the following provider within the specified time frame, or sooner as dictated by the severity of her symptoms. As always, she was instructed that for any  urgent/emergent care needs, she should seek care either here or in the emergency department for more immediate evaluation.  Follow-up Information    Mikey College, NP.   Specialty: Nurse Practitioner Why: As needed Contact information: [address] [place] [address] [phone]         NOTE: This note was prepared using Dragon dictation software along with smaller phrase technology. Despite my best ability to proofread, there is the potential that transcriptional errors may still occur from this process, and are completely unintentional.     Karen Kitchens, NP 05/01/19 1609

## 2019-05-03 LAB — NOVEL CORONAVIRUS, NAA (HOSP ORDER, SEND-OUT TO REF LAB; TAT 18-24 HRS): SARS-CoV-2, NAA: NOT DETECTED

## 2019-05-09 ENCOUNTER — Encounter: Payer: Self-pay | Admitting: *Deleted

## 2019-05-09 NOTE — Progress Notes (Signed)
CAll to [place] to find out if ready to return to on site program.  She is busy with work(opening school) and want s to continue with the virtual Cardiac Rehab for at least another month.

## 2019-06-18 ENCOUNTER — Encounter: Payer: Self-pay | Admitting: *Deleted

## 2019-06-18 DIAGNOSIS — Z951 Presence of aortocoronary bypass graft: Secondary | ICD-10-CM

## 2019-06-18 NOTE — Progress Notes (Signed)
Cardiac Individual Treatment Plan  Patient Details  Name: Olivia Roth MRN: [id number] Date of Birth: 10/15/1955 Referring Provider:     Cardiac Rehab from 10/24/2018 in [facility] Cardiac and Pulmonary Rehab  Referring Provider  End      Initial Encounter Date:    Cardiac Rehab from 10/24/2018 in [facility] Cardiac and Pulmonary Rehab  Date  10/24/18      Visit Diagnosis: S/P CABG x 4  Patient's Home Medications on Admission:  Current Outpatient Medications:  .  acetaminophen (TYLENOL) 500 MG tablet, Take 2 tablets (1,000 mg total) by mouth every 6 (six) hours as needed., Disp: 30 tablet, Rfl: 0 .  albuterol (PROVENTIL HFA;VENTOLIN HFA) 108 (90 Base) MCG/ACT inhaler, Inhale 2 puffs into the lungs every 6 (six) hours as needed for wheezing or shortness of breath., Disp: 1 Inhaler, Rfl: 11 .  aspirin EC 81 MG tablet, Take 1 tablet (81 mg total) by mouth daily., Disp: 90 tablet, Rfl: 3 .  atorvastatin (LIPITOR) 80 MG tablet, TAKE 1 TABLET DAILY AT 6 P.M., Disp: 90 tablet, Rfl: 1 .  blood glucose meter kit and supplies KIT, Dispense based on patient and insurance preference. two times daily as directed. For E11.65, Disp: 1 each, Rfl: 0 .  clopidogrel (PLAVIX) 75 MG tablet, Take 1 tablet (75 mg total) by mouth daily., Disp: 90 tablet, Rfl: 0 .  glucose blood test strip, Use as instructed, Disp: 100 each, Rfl: 12 .  Lancets (ONETOUCH ULTRASOFT) lancets, TEST TWICE DAILY, Disp: 100 each, Rfl: 3 .  metFORMIN (GLUCOPHAGE) 500 MG tablet, Take 2 tab in morning with meal, and take 1 tab in evening with meal, Disp: 270 tablet, Rfl: 1 .  metoprolol tartrate (LOPRESSOR) 25 MG tablet, TAKE 1 TABLET TWICE A DAY, Disp: 180 tablet, Rfl: 1  Past Medical History: Past Medical History:  Diagnosis Date  . Anxiety   . Asthma    In cold weather (08/21/2018)  . CAD (coronary artery disease)    a. 08/2018 NSTEMI/Cath: LM 50d,LAD 80ost/p, 87m70d, LCX 90ost, OM1 50, OM3 70, OM4 80, LPDA small, RCA  99p/d, EF 30-35%;  c. 08/2018 CABG x 4: LIMA->LAD, VG->RI, VG->OM, VG->RCA.  .Marland KitchenChronic combined systolic (congestive) and diastolic (congestive) heart failure ([place]    a. 08/2018 Echo: EF 40-45%, mid-apicalanteroseptal and apical HK. Gr2 DD. Mild AI; b. 08/2018 intraop TEE: EF 45-50%, antsept, infsept HK. Mild AI. Mildly dil Ao root. Trace MR.  . Family history of adverse reaction to anesthesia    "mother PONV; talked crazy/loud after anesthesia" (08/21/2018)  . GERD (gastroesophageal reflux disease)   . High cholesterol   . Ischemic cardiomyopathy    a. 08/2018 Echo: EF 40-45%; b. 08/2018 TEE: EF 45-50%.  . Seasonal allergies   . Tuberculosis ~ 1960  . Type II diabetes mellitus ([place]   . UTI (lower urinary tract infection)    "once; before hysterectomy" (08/21/2018)    Tobacco Use: Social History   Tobacco Use  Smoking Status Never Smoker  Smokeless Tobacco Never Used    Labs: Recent Review Flowsheet Data    Labs for ITP Cardiac and Pulmonary Rehab Latest Ref Rng & Units 08/28/2018 08/29/2018 10/04/2018 12/06/2018 03/12/2019   Cholestrol 100 - 199 mg/dL - - 120 - -   LDLCALC 0 - 99 mg/dL - - 50 - -   HDL >39 mg/dL - - 33(L) - -   Trlycerides 0 - 149 mg/dL - - 186(H) - -   Hemoglobin A1c <5.7 %  of total Hgb - - - 5.7(A) 6.4(H)   PHART 7.350 - 7.450 - - - - -   PCO2ART 32.0 - 48.0 mmHg - - - - -   HCO3 20.0 - 28.0 mmol/L - - - - -   TCO2 22 - 32 mmol/L - - - - -   ACIDBASEDEF 0.0 - 2.0 mmol/L - - - - -   O2SAT % 62.2 62.5 - - -       Exercise Target Goals: Exercise Program Goal: Individual exercise prescription set using results from initial 6 min walk test and THRR while considering  patient's activity barriers and safety.   Exercise Prescription Goal: Initial exercise prescription builds to 30-45 minutes a day of aerobic activity, 2-3 days per week.  Home exercise guidelines will be given to patient during program as part of exercise prescription that the participant will  acknowledge.  Activity Barriers & Risk Stratification:   6 Minute Walk:   Oxygen Initial Assessment:   Oxygen Re-Evaluation:   Oxygen Discharge (Final Oxygen Re-Evaluation):   Initial Exercise Prescription:   Perform Capillary Blood Glucose checks as needed.  Exercise Prescription Changes: Exercise Prescription Changes    Row Name 12/25/18 1000             Response to Exercise   Blood Pressure (Admit)  100/60       Blood Pressure (Exercise)  130/64       Blood Pressure (Exit)  106/62       Heart Rate (Admit)  65 bpm       Heart Rate (Exercise)  97 bpm       Heart Rate (Exit)  72 bpm       Rating of Perceived Exertion (Exercise)  12       Symptoms  none       Duration  Continue with 30 min of aerobic exercise without signs/symptoms of physical distress.       Intensity  THRR unchanged         Progression   Progression  Continue to progress workloads to maintain intensity without signs/symptoms of physical distress.       Average METs  3.3         Resistance Training   Training Prescription  Yes       Weight  4 lbs       Reps  10-15         Interval Training   Interval Training  No         Treadmill   MPH  2.2       Grade  1       Minutes  15       METs  2.99         Recumbant Bike   Level  3       Minutes  15       METs  3.1         REL-XR   Level  3       Minutes  15       METs  3.8         Home Exercise Plan   Plans to continue exercise at  Home (comment) walking       Frequency  Add 3 additional days to program exercise sessions.       Initial Home Exercises Provided  11/07/18          Exercise Comments:   Exercise Goals and Review:  Exercise Goals Re-Evaluation : Exercise Goals Re-Evaluation    Row Name 12/25/18 1023 01/09/19 1005 01/29/19 1302         Exercise Goal Re-Evaluation   Exercise Goals Review  Increase Physical Activity;Increase Strength and Stamina;Understanding of Exercise Prescription  Increase Physical  Activity;Increase Strength and Stamina;Understanding of Exercise Prescription  Increase Physical Activity;Increase Strength and Stamina;Understanding of Exercise Prescription     Comments  Olivia Roth has been doing well in rehab.  She is up to 3.8 METs on the XR.  She will be walking at home.  We will continue to monitor her progress.   Kelcie is doing well at home.  She is still getting her walking daily. She has also tried out some low impact aerobics videos on YouTube.  She is going to try out our videos that we sent out this week.  She is enjoying finding different alternatives.   Daisie is doing well with her exercise at home.  The nicer weather and less pollen are making it easier for her to get outside.  She has also been using a few videos for exercise too.  She has plans to start walking with her husband daily when he gets home from work.       Expected Outcomes  Short: Continue walk at home Long: Continue to improve strength and stamina.   Short: Continue to exercise daily.  Long: Continue to increase strenght and stamina.   Short: Start walking more.  Long:  Continue to work on increasing activity.         Discharge Exercise Prescription (Final Exercise Prescription Changes): Exercise Prescription Changes - 12/25/18 1000      Response to Exercise   Blood Pressure (Admit)  100/60    Blood Pressure (Exercise)  130/64    Blood Pressure (Exit)  106/62    Heart Rate (Admit)  65 bpm    Heart Rate (Exercise)  97 bpm    Heart Rate (Exit)  72 bpm    Rating of Perceived Exertion (Exercise)  12    Symptoms  none    Duration  Continue with 30 min of aerobic exercise without signs/symptoms of physical distress.    Intensity  THRR unchanged      Progression   Progression  Continue to progress workloads to maintain intensity without signs/symptoms of physical distress.    Average METs  3.3      Resistance Training   Training Prescription  Yes    Weight  4 lbs    Reps  10-15      Interval Training    Interval Training  No      Treadmill   MPH  2.2    Grade  1    Minutes  15    METs  2.99      Recumbant Bike   Level  3    Minutes  15    METs  3.1      REL-XR   Level  3    Minutes  15    METs  3.8      Home Exercise Plan   Plans to continue exercise at  Home (comment)   walking   Frequency  Add 3 additional days to program exercise sessions.    Initial Home Exercises Provided  11/07/18       Nutrition:  Target Goals: Understanding of nutrition guidelines, daily intake of sodium <1511m, cholesterol <2028m calories 30% from fat and 7% or less from saturated fats, daily to  have 5 or more servings of fruits and vegetables.  Biometrics:    Nutrition Therapy Plan and Nutrition Goals: Nutrition Therapy & Goals - 02/13/19 1404      Nutrition Therapy   Diet  Diabetic, low sodium, heart healthy Diet. A1C now 5.17    Protein (specify units)  50g    Fiber  25 grams    Whole Grain Foods  3 servings    Saturated Fats  12 max. grams    Fruits and Vegetables  5 servings/day    Sodium  1.5 grams      Personal Nutrition Goals   Nutrition Goal  ST: continue healthy eating at home during COVID LT: 130lb weight loss goal made with PCP    Comments  watching what she is eating since rehab, lost some weight (140 -->138lbs, started at 151lbs), but not as strict since being home due to [place]. Moving down on diabetes medication. BG readings 110 -120 ish. B: bowl of cheerios (yogurt oikos) or oatmeal (1/2 apple)  and coffee and will sometimes have eggs and toast. Lunch tuna, little bit of mayo and some crackers and water. D: beets, lean pork some beans and rice (small amount). Grains make BG go up at night when eaten at dinner, has "heavy carbs" before 5pm. Changes done: 99% of food is unsalted (watches sodium), goes for walks, drink skim milk, diet soad and water. -- changed whole lifestyle., plans meals now -- not super strcit and flexible (i.e. sustainable).  Discussed healthy weightloss  and mindful eating while at home, provided support.        Nutrition Assessments:   Nutrition Goals Re-Evaluation: Nutrition Goals Re-Evaluation    [place] Name 03/27/19 1132             Goals   Current Weight  137 lb (62.1 kg)       Nutrition Goal  ST: continue HH eating and moving more, LT: 130 lbs       Comment  147lbs when started rehab, lost 10lbs since. HgA1c 6.7 (a little higher than it was but still normal - discussed how stress could play a role in increasing BG as well as lack of exercise). Pt reports that she hasn't been exercising, but plans to start up soon and now she is working again so she will move more. Continues HH eating with no rpeorted barriers.       Expected Outcome  Continue HH eating, increase PA and manage BG          Nutrition Goals Discharge (Final Nutrition Goals Re-Evaluation): Nutrition Goals Re-Evaluation - 03/27/19 1132      Goals   Current Weight  137 lb (62.1 kg)    Nutrition Goal  ST: continue HH eating and moving more, LT: 130 lbs    Comment  147lbs when started rehab, lost 10lbs since. HgA1c 6.7 (a little higher than it was but still normal - discussed how stress could play a role in increasing BG as well as lack of exercise). Pt reports that she hasn't been exercising, but plans to start up soon and now she is working again so she will move more. Continues HH eating with no rpeorted barriers.    Expected Outcome  Continue HH eating, increase PA and manage BG       Psychosocial: Target Goals: Acknowledge presence or absence of significant depression and/or stress, maximize coping skills, provide positive support system. Participant is able to verbalize types and ability to use techniques and  skills needed for reducing stress and depression.   Initial Review & Psychosocial Screening:   Quality of Life Scores:   Scores of 19 and below usually indicate a poorer quality of life in these areas.  A difference of  2-3 points is a clinically  meaningful difference.  A difference of 2-3 points in the total score of the Quality of Life Index has been associated with significant improvement in overall quality of life, self-image, physical symptoms, and general health in studies assessing change in quality of life.  PHQ-9: Recent Review Flowsheet Data    Depression screen [facility] 2/9 03/10/2019 10/24/2018 09/03/2018 07/04/2017 10/06/2015   Decreased Interest 0 0 1 0 0   Down, Depressed, Hopeless 0 0 1 0 0   PHQ - 2 Score 0 0 2 0 0   Altered sleeping - 0 1 - -   Tired, decreased energy - 0 1 - -   Change in appetite - 0 1 - -   Feeling bad or failure about yourself  - 0 0 - -   Trouble concentrating - 0 0 - -   Moving slowly or fidgety/restless - 0 0 - -   Suicidal thoughts - 0 0 - -   PHQ-9 Score - 0 5 - -   Difficult doing work/chores - Not difficult at all Not difficult at all - -     Interpretation of Total Score  Total Score Depression Severity:  1-4 = Minimal depression, 5-9 = Mild depression, 10-14 = Moderate depression, 15-19 = Moderately severe depression, 20-27 = Severe depression   Psychosocial Evaluation and Intervention:   Psychosocial Re-Evaluation: Psychosocial Re-Evaluation    Row Name 01/09/19 1006 01/29/19 1308           Psychosocial Re-Evaluation   Current issues with  -  Current Stress Concerns      Comments  Deicy is doing well mentally.  She had a good follow up visit with her cardiologist over the phone on Friday.  He said that she was doing good and continued to encourage her to exercise. She continues to work from home as she can. 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  She enjoys staying connected with her kids.  She is ready for all of this to be over and get back to school!  Lilliana has been doing well mentally.  She is sleeping good and doing okay with work. 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  Her biggest concern was a bruise that has  suddenly popped up on her arm.  As of now, it is still soft and she is keeping an eye on it.  I encouraged her to call the doctor if she devlops a nodule or it gets bigger.        Expected Outcomes  Short: Continue to connect with teachers and kids via internet.  Long: Continue to cope positively!  Short: Continue to connect with kids and watch bruise.  Long: Continue to stay posite.       Interventions  -  Encouraged to attend Cardiac Rehabilitation for the exercise      Continue Psychosocial Services   -  Follow up required by staff         Psychosocial Discharge (Final Psychosocial Re-Evaluation): Psychosocial Re-Evaluation - 01/29/19 1308      Psychosocial Re-Evaluation   Current issues with  Current Stress Concerns    Comments  Porschea has  been doing well mentally.  She is sleeping good and doing okay with work. 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  Her biggest concern was a bruise that has suddenly popped up on her arm.  As of now, it is still soft and she is keeping an eye on it.  I encouraged her to call the doctor if she devlops a nodule or it gets bigger.      Expected Outcomes  Short: Continue to connect with kids and watch bruise.  Long: Continue to stay posite.     Interventions  Encouraged to attend Cardiac Rehabilitation for the exercise    Continue Psychosocial Services   Follow up required by staff       Vocational Rehabilitation: Provide vocational rehab assistance to qualifying candidates.   Vocational Rehab Evaluation & Intervention:   Education: Education Goals: Education classes will be provided on a variety of topics geared toward better understanding of heart health and risk factor modification. Participant will state understanding/return demonstration of topics presented as noted by education test scores.  Learning Barriers/Preferences:   Education Topics:  AED/CPR: - Group verbal and written instruction with the use of  models to demonstrate the basic use of the AED with the basic ABC's of resuscitation.   Cardiac Rehab from 12/19/2018 in [facility] Cardiac and Pulmonary Rehab  Date  12/03/18  Educator  SB  Instruction Review Code  1- Verbalizes Understanding      General Nutrition Guidelines/Fats and Fiber: -Group instruction provided by verbal, written material, models and posters to present the general guidelines for heart healthy nutrition. Gives an explanation and review of dietary fats and fiber.   Cardiac Rehab from 12/19/2018 in [facility] Cardiac and Pulmonary Rehab  Date  12/17/18  Educator  [facility]  Instruction Review Code  1- Verbalizes Understanding      Controlling Sodium/Reading Food Labels: -Group verbal and written material supporting the discussion of sodium use in heart healthy nutrition. Review and explanation with models, verbal and written materials for utilization of the food label.   Cardiac Rehab from 12/19/2018 in [facility] Cardiac and Pulmonary Rehab  Date  12/19/18  Educator  [facility]  Instruction Review Code  1- Verbalizes Understanding      Exercise Physiology & General Exercise Guidelines: - Group verbal and written instruction with models to review the exercise physiology of the cardiovascular system and associated critical values. Provides general exercise guidelines with specific guidelines to those with heart or lung disease.    Cardiac Rehab from 12/19/2018 in [facility] Cardiac and Pulmonary Rehab  Date  10/29/18  Educator  [facility]  Instruction Review Code  1- Verbalizes Understanding      Aerobic Exercise & Resistance Training: - Gives group verbal and written instruction on the various components of exercise. Focuses on aerobic and resistive training programs and the benefits of this training and how to safely progress through these programs..   Cardiac Rehab from 12/19/2018 in [facility] Cardiac and Pulmonary Rehab  Date  10/31/18  Educator  [place]  Instruction Review Code  1- Verbalizes Understanding       Flexibility, Balance, Mind/Body Relaxation: Provides group verbal/written instruction on the benefits of flexibility and balance training, including mind/body exercise modes such as yoga, pilates and tai chi.  Demonstration and skill practice provided.   Cardiac Rehab from 12/19/2018 in [facility] Cardiac and Pulmonary Rehab  Date  11/05/18  Educator  AS  Instruction Review Code  1- Verbalizes Understanding  Stress and Anxiety: - Provides group verbal and written instruction about the health risks of elevated stress and causes of high stress.  Discuss the correlation between heart/lung disease and anxiety and treatment options. Review healthy ways to manage with stress and anxiety.   Cardiac Rehab from 12/19/2018 in [facility] Cardiac and Pulmonary Rehab  Date  11/26/18  Educator  [facility]  Instruction Review Code  1- Verbalizes Understanding      Depression: - Provides group verbal and written instruction on the correlation between heart/lung disease and depressed mood, treatment options, and the stigmas associated with seeking treatment.   Cardiac Rehab from 12/19/2018 in [facility] Cardiac and Pulmonary Rehab  Date  12/10/18  Educator  [facility]  Instruction Review Code  1- Verbalizes Understanding      Anatomy & Physiology of the Heart: - Group verbal and written instruction and models provide basic cardiac anatomy and physiology, with the coronary electrical and arterial systems. Review of Valvular disease and Heart Failure   Cardiac Rehab from 12/19/2018 in [facility] Cardiac and Pulmonary Rehab  Date  11/14/18  Educator  CE  Instruction Review Code  1- Verbalizes Understanding      Cardiac Procedures: - Group verbal and written instruction to review commonly prescribed medications for heart disease. Reviews the medication, class of the drug, and side effects. Includes the steps to properly store meds and maintain the prescription regimen. (beta blockers and nitrates)   Cardiac Rehab from 12/19/2018 in  [facility] Cardiac and Pulmonary Rehab  Date  11/28/18  Educator  CE  Instruction Review Code  1- Verbalizes Understanding      Cardiac Medications I: - Group verbal and written instruction to review commonly prescribed medications for heart disease. Reviews the medication, class of the drug, and side effects. Includes the steps to properly store meds and maintain the prescription regimen.   Cardiac Rehab from 12/19/2018 in [facility] Cardiac and Pulmonary Rehab  Date  11/19/18  Educator  SB  Instruction Review Code  1- Verbalizes Understanding      Cardiac Medications II: -Group verbal and written instruction to review commonly prescribed medications for heart disease. Reviews the medication, class of the drug, and side effects. (all other drug classes)   Cardiac Rehab from 12/19/2018 in [facility] Cardiac and Pulmonary Rehab  Date  12/12/18  Educator  CE  Instruction Review Code  1- Verbalizes Understanding       Go Sex-Intimacy & Heart Disease, Get SMART - Goal Setting: - Group verbal and written instruction through game format to discuss heart disease and the return to sexual intimacy. Provides group verbal and written material to discuss and apply goal setting through the application of the S.M.A.R.T. Method.   Cardiac Rehab from 12/19/2018 in [facility] Cardiac and Pulmonary Rehab  Date  11/28/18  Educator  CE  Instruction Review Code  1- Verbalizes Understanding      Other Matters of the Heart: - Provides group verbal, written materials and models to describe Stable Angina and Peripheral Artery. Includes description of the disease process and treatment options available to the cardiac patient.   Exercise & Equipment Safety: - Individual verbal instruction and demonstration of equipment use and safety with use of the equipment.   Cardiac Rehab from 12/19/2018 in [facility] Cardiac and Pulmonary Rehab  Date  10/24/18  Educator  [facility]  Instruction Review Code  1- Verbalizes Understanding       Infection Prevention: - Provides verbal and written material to individual with discussion of infection control  including proper hand washing and proper equipment cleaning during exercise session.   Cardiac Rehab from 12/19/2018 in [facility] Cardiac and Pulmonary Rehab  Date  10/24/18  Educator  [facility]  Instruction Review Code  1- Verbalizes Understanding      Falls Prevention: - Provides verbal and written material to individual with discussion of falls prevention and safety.   Cardiac Rehab from 12/19/2018 in [facility] Cardiac and Pulmonary Rehab  Date  10/24/18  Educator  [facility]  Instruction Review Code  1- Verbalizes Understanding      Diabetes: - Individual verbal and written instruction to review signs/symptoms of diabetes, desired ranges of glucose level fasting, after meals and with exercise. Acknowledge that pre and post exercise glucose checks will be done for 3 sessions at entry of program.   Know Your Numbers and Risk Factors: -Group verbal and written instruction about important numbers in your health.  Discussion of what are risk factors and how they play a role in the disease process.  Review of Cholesterol, Blood Pressure, Diabetes, and BMI and the role they play in your overall health.   Cardiac Rehab from 12/19/2018 in [facility] Cardiac and Pulmonary Rehab  Date  12/12/18  Educator  CE  Instruction Review Code  1- Verbalizes Understanding      Sleep Hygiene: -Provides group verbal and written instruction about how sleep can affect your health.  Define sleep hygiene, discuss sleep cycles and impact of sleep habits. Review good sleep hygiene tips.    Cardiac Rehab from 12/19/2018 in [facility] Cardiac and Pulmonary Rehab  Date  11/12/18  Educator  [facility]  Instruction Review Code  1- Verbalizes Understanding      Other: -Provides group and verbal instruction on various topics (see comments)   Knowledge Questionnaire Score:   Core Components/Risk Factors/Patient Goals at  Admission:   Core Components/Risk Factors/Patient Goals Review:  Goals and Risk Factor Review    Row Name 01/09/19 1010 01/29/19 1314           Core Components/Risk Factors/Patient Goals Review   Personal Goals Review  Weight Management/Obesity;Diabetes;Hypertension;Lipids  Weight Management/Obesity;Diabetes;Hypertension;Lipids      Review  Dessire is doing well at home.  She had a televisit with her cardiologist and got a good report.  Her weight is down to 138 lbs as she is really watching what she is eating.  Her pressures have been good it was 100/69 for her appointment.  She continues to check her blood check her sugars daily and they have all been under 131m/dl!!  She is please with her progress and eager to get back to class!  MTashicacontinues to do well at home.  She is still losing some weight as she continues to watch what she is eating.  She continues to get good readings on her blood pressures and she said that her blood sugars are doing excellent!  She was excited for this change and hopes to keep it up.       Expected Outcomes  Short: Continue to keep weight down.  Long; Continue to monitor risk factors.   Short: Continue to maintain weight loss.  Long: Continue to monitor risk factors.          Core Components/Risk Factors/Patient Goals at Discharge (Final Review):  Goals and Risk Factor Review - 01/29/19 1314      Core Components/Risk Factors/Patient Goals Review   Personal Goals Review  Weight Management/Obesity;Diabetes;Hypertension;Lipids    Review  MMairelycontinues to do well at home.  She is still losing some weight as she continues to watch what she is eating.  She continues to get good readings on her blood pressures and she said that her blood sugars are doing excellent!  She was excited for this change and hopes to keep it up.     Expected Outcomes  Short: Continue to maintain weight loss.  Long: Continue to monitor risk factors.        ITP Comments: ITP Comments     Row Name 12/25/18 1023 01/01/19 1213 04/23/19 1328 05/09/19 1102 06/18/19 1448   ITP Comments  Our program is currently closed due to COVID-19.  We are communicating with patient via phone calls and emails.    30 day review. Continue with ITP unless directed changes by Medical Director chart review.  30 day review cycle restarting  after being closed since March 16 because of  Covid 19 pandemic. Program opened to patients on July 6. Not all have returned. ITP updated and sent to Medical Director for review,changes as needed and signature  Spoke with [facility] today.  She is busy getting school open and wants to continue with the [facility] for at least another month.   She has a few visits left and wants to complete the progrm.  Pt never returned once we reopened after the COVID-19 closure.  Discharge ITP and  summary created and sent for review.      Comments: Discharge ITP

## 2019-06-18 NOTE — Progress Notes (Signed)
Discharge Progress Report  Patient Details  Name: Olivia Roth MRN: [id number] Date of Birth: 09/21/1956 Referring Provider:     Cardiac Rehab from 10/24/2018 in [facility]  Referring Provider  End       Number of Visits: 31  Reason for Discharge:  Early Exit:  Lack of attendance  Smoking History:  Social History   Tobacco Use  Smoking Status Never Smoker  Smokeless Tobacco Never Used    Diagnosis:  S/P CABG x 4  ADL UCSD:   Initial Exercise Prescription:   Discharge Exercise Prescription (Final Exercise Prescription Changes): Exercise Prescription Changes - 12/25/18 1000      Response to Exercise   Blood Pressure (Admit)  100/60    Blood Pressure (Exercise)  130/64    Blood Pressure (Exit)  106/62    Heart Rate (Admit)  65 bpm    Heart Rate (Exercise)  97 bpm    Heart Rate (Exit)  72 bpm    Rating of Perceived Exertion (Exercise)  12    Symptoms  none    Duration  Continue with 30 min of aerobic exercise without signs/symptoms of physical distress.    Intensity  THRR unchanged      Progression   Progression  Continue to progress workloads to maintain intensity without signs/symptoms of physical distress.    Average METs  3.3      Resistance Training   Training Prescription  Yes    Weight  4 lbs    Reps  10-15      Interval Training   Interval Training  No      Treadmill   MPH  2.2    Grade  1    Minutes  15    METs  2.99      Recumbant Bike   Level  3    Minutes  15    METs  3.1      REL-XR   Level  3    Minutes  15    METs  3.8      Home Exercise Plan   Plans to continue exercise at  Home (comment)   walking   Frequency  Add 3 additional days to program exercise sessions.    Initial Home Exercises Provided  11/07/18       Functional Capacity:   Psychological, QOL, Others - Outcomes: PHQ 2/9: Depression screen [facility] 2/9 03/10/2019 10/24/2018 09/03/2018 07/04/2017 10/06/2015  Decreased Interest 0 0 1 0 0  Down,  Depressed, Hopeless 0 0 1 0 0  PHQ - 2 Score 0 0 2 0 0  Altered sleeping - 0 1 - -  Tired, decreased energy - 0 1 - -  Change in appetite - 0 1 - -  Feeling bad or failure about yourself  - 0 0 - -  Trouble concentrating - 0 0 - -  Moving slowly or fidgety/restless - 0 0 - -  Suicidal thoughts - 0 0 - -  PHQ-9 Score - 0 5 - -  Difficult doing work/chores - Not difficult at all Not difficult at all - -    Quality of Life:   Personal Goals: Goals established at orientation with interventions provided to work toward goal.    Personal Goals Discharge: Goals and Risk Factor Review    Row Name 01/09/19 1010 01/29/19 1314           Core Components/Risk Factors/Patient Goals Review   Personal Goals Review  Weight  Management/Obesity;Diabetes;Hypertension;Lipids  Weight Management/Obesity;Diabetes;Hypertension;Lipids      Review  Olivia Roth is doing well at home.  She had a televisit with her cardiologist and got a good report.  Her weight is down to 138 lbs as she is really watching what she is eating.  Her pressures have been good it was 100/69 for her appointment.  She continues to check her blood check her sugars daily and they have all been under 115mg /dl!!  She is please with her progress and eager to get back to class!  Olivia Roth continues to do well at home.  She is still losing some weight as she continues to watch what she is eating.  She continues to get good readings on her blood pressures and she said that her blood sugars are doing excellent!  She was excited for this change and hopes to keep it up.       Expected Outcomes  Short: Continue to keep weight down.  Long; Continue to monitor risk factors.   Short: Continue to maintain weight loss.  Long: Continue to monitor risk factors.          Exercise Goals and Review:   Exercise Goals Re-Evaluation: Exercise Goals Re-Evaluation    Row Name 12/25/18 1023 01/09/19 1005 01/29/19 1302         Exercise Goal Re-Evaluation   Exercise  Goals Review  Increase Physical Activity;Increase Strength and Stamina;Understanding of Exercise Prescription  Increase Physical Activity;Increase Strength and Stamina;Understanding of Exercise Prescription  Increase Physical Activity;Increase Strength and Stamina;Understanding of Exercise Prescription     Comments  Olivia Roth has been doing well in rehab.  She is up to 3.8 METs on the XR.  She will be walking at home.  We will continue to monitor her progress.   Olivia Roth is doing well at home.  She is still getting her walking daily. She has also tried out some low impact aerobics videos on YouTube.  She is going to try out our videos that we sent out this week.  She is enjoying finding different alternatives.   Olivia Roth is doing well with her exercise at home.  The nicer weather and less pollen are making it easier for her to get outside.  She has also been using a few videos for exercise too.  She has plans to start walking with her husband daily when he gets home from work.       Expected Outcomes  Short: Continue walk at home Long: Continue to improve strength and stamina.   Short: Continue to exercise daily.  Long: Continue to increase strenght and stamina.   Short: Start walking more.  Long:  Continue to work on increasing activity.         Nutrition & Weight - Outcomes:    Nutrition: Nutrition Therapy & Goals - 02/13/19 1404      Nutrition Therapy   Diet  Diabetic, low sodium, heart healthy Diet. A1C now 5.17    Protein (specify units)  50g    Fiber  25 grams    Whole Grain Foods  3 servings    Saturated Fats  12 max. grams    Fruits and Vegetables  5 servings/day    Sodium  1.5 grams      Personal Nutrition Goals   Nutrition Goal  ST: continue healthy eating at home during COVID LT: 130lb weight loss goal made with PCP    Comments  watching what she is eating since rehab, lost some weight (140 -->138lbs, started  at 151lbs), but not as strict since being home due to [place]. Moving down on  diabetes medication. BG readings 110 -120 ish. B: bowl of cheerios (yogurt oikos) or oatmeal (1/2 apple)  and coffee and will sometimes have eggs and toast. Lunch tuna, little bit of mayo and some crackers and water. D: beets, lean pork some beans and rice (small amount). Grains make BG go up at night when eaten at dinner, has "heavy carbs" before 5pm. Changes done: 99% of food is unsalted (watches sodium), goes for walks, drink skim milk, diet soad and water. -- changed whole lifestyle., plans meals now -- not super strcit and flexible (i.e. sustainable).  Discussed healthy weightloss and mindful eating while at home, provided support.        Nutrition Discharge:   Education Questionnaire Score:   Goals reviewed with patient; copy given to patient.

## 2019-06-23 ENCOUNTER — Other Ambulatory Visit: Payer: Self-pay | Admitting: Internal Medicine

## 2019-06-30 NOTE — Progress Notes (Signed)
Follow-up Outpatient Visit Date: 07/02/2019  Primary Care Provider: Mikey College, NP [place] [address]  Chief Complaint: Follow-up CAD  HPI:  Olivia Roth is a 63 y.o. year-old female with history of coronary artery disease status post urgent CABG in the setting of NSTEMI in 08/2018, hyperlipidemia, diabetes mellitus, GERD, and anxiety, who presents for follow-up of coronary artery disease.  We last spoke in late March, at which time she was doing very well other than mild exertional dyspnea when climbing several stairs.  Today, Ms. Pharo reports feeling well other than sensitivity along the upper margin of her median sternotomy incision.  It is particularly irritated by wearing seatbelt.  She denies anginal chest pain, shortness of breath, palpitations, lightheadedness, and edema.  Her BP has been well-controlled at home.  Her husband contracted COVID-19 in July but has recovered.  Ms. Davee was able to isolate herself and did not fall ill.  She is tolerating her medication well.  --------------------------------------------------------------------------------------------------  Past Medical History:  Diagnosis Date  . Anxiety   . Asthma    In cold weather (08/21/2018)  . CAD (coronary artery disease)    a. 08/2018 NSTEMI/Cath: LM 50d,LAD 80ost/p, 1m70d, LCX 90ost, OM1 50, OM3 70, OM4 80, LPDA small, RCA  99p/d, EF 30-35%; c. 08/2018 CABG x 4: LIMA->LAD, VG->RI, VG->OM, VG->RCA.  .Marland KitchenChronic combined systolic (congestive) and diastolic (congestive) heart failure ([place]    a. 08/2018 Echo: EF 40-45%, mid-apicalanteroseptal and apical HK. Gr2 DD. Mild AI; b. 08/2018 intraop TEE: EF 45-50%, antsept, infsept HK. Mild AI. Mildly dil Ao root. Trace MR.  . Family history of adverse reaction to anesthesia    "mother PONV; talked crazy/loud after anesthesia" (08/21/2018)  . GERD (gastroesophageal reflux disease)   . High cholesterol   . Ischemic cardiomyopathy    a.  08/2018 Echo: EF 40-45%; b. 08/2018 TEE: EF 45-50%.  . Seasonal allergies   . Tuberculosis ~ 1960  . Type II diabetes mellitus ([place]   . UTI (lower urinary tract infection)    "once; before hysterectomy" (08/21/2018)   Past Surgical History:  Procedure Laterality Date  . CORONARY ARTERY BYPASS GRAFT N/A 08/26/2018   Procedure: CORONARY ARTERY BYPASS GRAFTING (CABG) times four using left internal mammary artery and right leg saphenous vein grafts;  Surgeon: VIvin Poot MD;  [facility]  Service: Open Heart Surgery;  Laterality: N/A;  . GANGLION CYST EXCISION Left ~ 1960   neck  . LEFT HEART CATH AND CORONARY ANGIOGRAPHY N/A 08/21/2018   Procedure: LEFT HEART CATH AND CORONARY ANGIOGRAPHY;  Surgeon: ENelva Bush MD;  [facility];  Service: Cardiovascular;  Laterality: N/A;  . TEE WITHOUT CARDIOVERSION N/A 08/26/2018   Procedure: TRANSESOPHAGEAL ECHOCARDIOGRAM (TEE);  Surgeon: VPrescott Gum PCollier Salina MD;  [facility]  Service: Open Heart Surgery;  Laterality: N/A;  . TOTAL ABDOMINAL HYSTERECTOMY  2006    Current Meds  Medication Sig  . acetaminophen (TYLENOL) 500 MG tablet Take 2 tablets (1,000 mg total) by mouth every 6 (six) hours as needed.  .Marland Kitchenaspirin EC 81 MG tablet Take 1 tablet (81 mg total) by mouth daily.  .Marland Kitchenatorvastatin (LIPITOR) 80 MG tablet TAKE 1 TABLET DAILY AT 6 P.M.  . blood glucose meter kit and supplies KIT Dispense based on patient and insurance preference. two times daily as directed. For E11.65  . clopidogrel (PLAVIX) 75 MG tablet TAKE 1 TABLET DAILY  . glucose blood test strip  Use as instructed  . Lancets (ONETOUCH ULTRASOFT) lancets TEST TWICE DAILY  . metFORMIN (GLUCOPHAGE) 500 MG tablet Take 2 tab in morning with meal, and take 1 tab in evening with meal  . metoprolol tartrate (LOPRESSOR) 25 MG tablet TAKE 1 TABLET TWICE A DAY    Allergies: Shellfish allergy, Avocado, Advair diskus [fluticasone-salmeterol], Cortisone, Pecan nut  (diagnostic), and Salmon [fish allergy]  Social History   Tobacco Use  . Smoking status: Never Smoker  . Smokeless tobacco: Never Used  Substance Use Topics  . Alcohol use: Not Currently  . Drug use: Never    Family History  Problem Relation Age of Onset  . Cancer Sister   . Heart disease Neg Hx     Review of Systems: Patient notes pain when elevating both arms.  Otherwise, a 12-system review of systems was performed and was negative except as noted in the HPI.  --------------------------------------------------------------------------------------------------  Physical Exam: BP 140/70 (BP Location: Left Arm, Patient Position: Sitting, Cuff Size: Normal)   Pulse 66   Ht _0  (1.549 m)   Wt 145 lb 4 oz (65.9 kg)   SpO2 99%   BMI 27.44 kg/m   General:  NAD HEENT: No conjunctival pallor or scleral icterus. Facemask in place Neck: Supple without lymphadenopathy, thyromegaly, JVD, or HJR. Lungs: Normal work of breathing. Clear to auscultation bilaterally without wheezes or crackles. Heart: Regular rate and rhythm without murmurs, rubs, or gallops. Non-displaced PMI. Abd: Bowel sounds present. Soft, NT/ND without hepatosplenomegaly Ext: No lower extremity edema. Radial, PT, and DP pulses are 2+ bilaterally. Skin: Median sternotomy incision healed with keloid and small vesicle near the superior margin of the scar.  EKG:  NSR with low voltage.  Otherwise, no significant abnormality.  Lab Results  Component Value Date   WBC 6.2 09/12/2018   HGB 9.7 (L) 09/12/2018   HCT 30.1 (L) 09/12/2018   MCV 91 09/12/2018   PLT 439 09/12/2018    Lab Results  Component Value Date   NA 143 03/05/2019   K 4.7 03/05/2019   CL 106 03/05/2019   CO2 28 03/05/2019   BUN 22 03/05/2019   CREATININE 0.90 03/05/2019   GLUCOSE 110 (H) 03/05/2019   ALT 16 10/04/2018    Lab Results  Component Value Date   CHOL 120 10/04/2018   HDL 33 (L) 10/04/2018   LDLCALC 50 10/04/2018   TRIG 186  (H) 10/04/2018   CHOLHDL 3.6 10/04/2018    --------------------------------------------------------------------------------------------------  ASSESSMENT AND PLAN: Coronary artery disease: Doing well without angina or dyspnea, 10 months since NSTEMI/CABG.  We will complete 12 months of DAPT, after which clopidogrel can be stopped.  Continue high-intensity statin therapy.  Check CBC in 09/2019.  Ischemic cardiomyopathy: LVEF 40-45% at time of MI.  No signs or symptoms of decompensated HF (NYHA class I).  We will obtain a limited echo at the patient's convenience to ensure the LVEF has improved.  Continue metoprolol.  If LVEF has not recovered, we will need to consider addition ACEI/ARB.  Keloid: Keloid noted along median sternotomy.  Given persistent sensitivity/discomfort and small vescicle at superior aspect of the incision, we will have Ms. Altamease Oiler see Dr. Prescott Gum for further evaluation.  Hypertension: Home BP well-controlled.  Continue metoprolol.  Consider adding ACEI/ARB if LVEF remains reduced or BP suboptimally controlled at home and follow-up visits.  Hyperlipidemia: LDL at goal.  Continue atorvastatin 80 mg daily.  Check CMP and FLP in 09/2019.  Follow-up: RTC 6 months.  Harrell Gave  Akira Perusse, MD 07/02/2019 9:55 AM

## 2019-07-02 ENCOUNTER — Encounter: Payer: Self-pay | Admitting: Internal Medicine

## 2019-07-02 ENCOUNTER — Other Ambulatory Visit: Payer: Self-pay

## 2019-07-02 ENCOUNTER — Ambulatory Visit (INDEPENDENT_AMBULATORY_CARE_PROVIDER_SITE_OTHER): Payer: 59 | Admitting: Cardiothoracic Surgery

## 2019-07-02 ENCOUNTER — Encounter: Payer: Self-pay | Admitting: Cardiothoracic Surgery

## 2019-07-02 ENCOUNTER — Ambulatory Visit (INDEPENDENT_AMBULATORY_CARE_PROVIDER_SITE_OTHER): Payer: 59 | Admitting: Internal Medicine

## 2019-07-02 VITALS — BP 140/70 | HR 66 | Ht 61.0 in | Wt 145.2 lb

## 2019-07-02 VITALS — BP 163/83 | HR 66 | Temp 96.8°F | Resp 16 | Ht 61.0 in | Wt 145.0 lb

## 2019-07-02 DIAGNOSIS — Z4889 Encounter for other specified surgical aftercare: Secondary | ICD-10-CM | POA: Diagnosis not present

## 2019-07-02 DIAGNOSIS — L910 Hypertrophic scar: Secondary | ICD-10-CM

## 2019-07-02 DIAGNOSIS — I10 Essential (primary) hypertension: Secondary | ICD-10-CM

## 2019-07-02 DIAGNOSIS — L905 Scar conditions and fibrosis of skin: Secondary | ICD-10-CM | POA: Diagnosis not present

## 2019-07-02 DIAGNOSIS — Z951 Presence of aortocoronary bypass graft: Secondary | ICD-10-CM | POA: Diagnosis not present

## 2019-07-02 DIAGNOSIS — E785 Hyperlipidemia, unspecified: Secondary | ICD-10-CM

## 2019-07-02 DIAGNOSIS — I2510 Atherosclerotic heart disease of native coronary artery without angina pectoris: Secondary | ICD-10-CM | POA: Diagnosis not present

## 2019-07-02 DIAGNOSIS — I255 Ischemic cardiomyopathy: Secondary | ICD-10-CM | POA: Diagnosis not present

## 2019-07-02 NOTE — Patient Instructions (Signed)
Medication Instructions:  Your physician recommends that you continue on your current medications as directed. Please refer to the Current Medication list given to you today.  In December 2020 - You may STOP taking Plavix.   If you need a refill on your cardiac medications before your next appointment, please call your pharmacy.   Lab work: Your physician recommends that you return for lab work near the end of December 2020. - You will need to be fasting. Do not eat or drink anything after midnight. You may only have water or black coffee (no cream/sugar). - LIPID, CMET, CBC. - Please go to the [facility]. You will check in at the front desk to the right as you walk into the atrium. Valet Parking is offered if needed.   If you have labs (blood work) drawn today and your tests are completely normal, you will receive your results only by: Marland Kitchen MyChart Message (if you have MyChart) OR . A paper copy in the mail If you have any lab test that is abnormal or we need to change your treatment, we will call you to review the results.  Testing/Procedures: Your physician has requested that you have an LIMITED echocardiogram. Echocardiography is a painless test that uses sound waves to create images of your heart. It provides your doctor with information about the size and shape of your heart and how well your heart's chambers and valves are working. This procedure takes approximately one hour. There are no restrictions for this procedure. You may get an IV, if needed, to receive an ultrasound enhancing agent through to better visualize your heart.    Follow-Up: At [facility], you and your health needs are our priority.  As part of our continuing mission to provide you with exceptional heart care, we have created designated Provider Care Teams.  These Care Teams include your primary Cardiologist (physician) and Advanced Practice Providers (APPs -  Physician Assistants and Nurse Practitioners)  who all work together to provide you with the care you need, when you need it. You will need a follow up appointment in 6 months.  Please call our office 2 months in advance to schedule this appointment.  You may see Nelva Bush, MD or one of the following Advanced Practice Providers on your designated Care Team:   Murray Hodgkins, NP Christell Faith, PA-C . Marrianne Mood, PA-C   Your physician recommends that you schedule a follow-up appointment AT DR VAN TRIGT'S OFFICE TODAY. You may head on over there as you leave here today.

## 2019-07-02 NOTE — Progress Notes (Signed)
PCP is Mikey College, NP Referring Provider is End, Harrell Gave, MD  Chief Complaint  Patient presents with  . Routine Post Op    s/p CABG X 4...08/26/18...referred by Dr. Saunders Revel d/t blistered area on her sternal incision    HPI: Patient returns almost 1 year postop CABG with painful area of her upper sternal keloid which probably has an inclusion cyst.  The rest the incision is fine and causes no symptoms.  After informed consent I performed excision of the upper sternal keloid under local anesthesia in the office.  I placed 4 nylon skin sutures which will be removed in approximately 1 week.   Past Medical History:  Diagnosis Date  . Anxiety   . Asthma    In cold weather (08/21/2018)  . CAD (coronary artery disease)    a. 08/2018 NSTEMI/Cath: LM 50d,LAD 80ost/p, 79m70d, LCX 90ost, OM1 50, OM3 70, OM4 80, LPDA small, RCA  99p/d, EF 30-35%; c. 08/2018 CABG x 4: LIMA->LAD, VG->RI, VG->OM, VG->RCA.  .Marland KitchenChronic combined systolic (congestive) and diastolic (congestive) heart failure ([place]    a. 08/2018 Echo: EF 40-45%, mid-apicalanteroseptal and apical HK. Gr2 DD. Mild AI; b. 08/2018 intraop TEE: EF 45-50%, antsept, infsept HK. Mild AI. Mildly dil Ao root. Trace MR.  . Family history of adverse reaction to anesthesia    "mother PONV; talked crazy/loud after anesthesia" (08/21/2018)  . GERD (gastroesophageal reflux disease)   . High cholesterol   . Ischemic cardiomyopathy    a. 08/2018 Echo: EF 40-45%; b. 08/2018 TEE: EF 45-50%.  . Seasonal allergies   . Tuberculosis ~ 1960  . Type II diabetes mellitus ([place]   . UTI (lower urinary tract infection)    "once; before hysterectomy" (08/21/2018)    Past Surgical History:  Procedure Laterality Date  . CORONARY ARTERY BYPASS GRAFT N/A 08/26/2018   Procedure: CORONARY ARTERY BYPASS GRAFTING (CABG) times four using left internal mammary artery and right leg saphenous vein grafts;  Surgeon: VIvin Poot MD;  [facility]   Service: Open Heart Surgery;  Laterality: N/A;  . GANGLION CYST EXCISION Left ~ 1960   neck  . LEFT HEART CATH AND CORONARY ANGIOGRAPHY N/A 08/21/2018   Procedure: LEFT HEART CATH AND CORONARY ANGIOGRAPHY;  Surgeon: ENelva Bush MD;  [facility];  Service: Cardiovascular;  Laterality: N/A;  . TEE WITHOUT CARDIOVERSION N/A 08/26/2018   Procedure: TRANSESOPHAGEAL ECHOCARDIOGRAM (TEE);  Surgeon: VPrescott Gum PCollier Salina MD;  [place] [facility]  Service: Open Heart Surgery;  Laterality: N/A;  . TOTAL ABDOMINAL HYSTERECTOMY  2006    Family History  Problem Relation Age of Onset  . Cancer Sister   . Heart disease Neg Hx     Social History Social History   Tobacco Use  . Smoking status: Never Smoker  . Smokeless tobacco: Never Used  Substance Use Topics  . Alcohol use: Not Currently  . Drug use: Never    Current Outpatient Medications  Medication Sig Dispense Refill  . acetaminophen (TYLENOL) 500 MG tablet Take 2 tablets (1,000 mg total) by mouth every 6 (six) hours as needed. 30 tablet 0  . albuterol (PROVENTIL HFA;VENTOLIN HFA) 108 (90 Base) MCG/ACT inhaler Inhale 2 puffs into the lungs every 6 (six) hours as needed for wheezing or shortness of breath. 1 Inhaler 11  . aspirin EC 81 MG tablet Take 1 tablet (81 mg total) by mouth daily. 90 tablet 3  . atorvastatin (LIPITOR) 80 MG tablet TAKE 1 TABLET DAILY AT  6 P.M. 90 tablet 1  . blood glucose meter kit and supplies KIT Dispense based on patient and insurance preference. two times daily as directed. For E11.65 1 each 0  . clopidogrel (PLAVIX) 75 MG tablet TAKE 1 TABLET DAILY 90 tablet 3  . glucose blood test strip Use as instructed 100 each 12  . Lancets (ONETOUCH ULTRASOFT) lancets TEST TWICE DAILY 100 each 3  . metFORMIN (GLUCOPHAGE) 500 MG tablet Take 2 tab in morning with meal, and take 1 tab in evening with meal 270 tablet 1  . metoprolol tartrate (LOPRESSOR) 25 MG tablet TAKE 1 TABLET TWICE A DAY 180 tablet 1    No current facility-administered medications for this visit.     Allergies  Allergen Reactions  . Shellfish Allergy Hives  . Avocado     UNSPECIFIED REACTION   . Advair Diskus [Fluticasone-Salmeterol] Palpitations  . Cortisone Other (See Comments)    drowsiness  . Pecan Nut (Diagnostic) Itching  . Dani Gobble [Fish Allergy] Rash    Rash and itching of ears    Review of Systems  The patient is doing well from a cardiac point.  Her cardiologist Dr. Saunders Revel says she needs to stay on her Plavix for 1 year postop.  She has had no angina.  She is back at work full-time.  BP (!) 163/83 (BP Location: Left Arm, Patient Position: Sitting, Cuff Size: Normal)   Pulse 66   Temp (!) 96.8 F (36 C)   Resp 16   Ht '5\' 1"'  (1.549 m)   Wt 145 lb (65.8 kg)   SpO2 98% Comment: ON RA  BMI 27.40 kg/m  Physical Exam      Exam    General- alert and comfortable    Neck- no JVD, no cervical adenopathy palpable, no carotid bruit    Sternal wound is healed but there is a prominent keloid and in the upper portion of the incision it is tender and sensitive but without cellulitis or fluctuance   Lungs- clear without rales, wheezes   Cor- regular rate and rhythm, no murmur , gallop   Abdomen- soft, non-tender   Extremities - warm, non-tender, minimal edema   Neuro- oriented, appropriate, no focal weakness   Diagnostic Tests: None  Impression: Excision of tender portion of sternal keloid in the office today under local anesthesia to help relieve symptoms of hypersensitivity  Plan: Return in about a week for suture removal   Len Childs, MD [facility] [phone]

## 2019-07-09 ENCOUNTER — Other Ambulatory Visit: Payer: Self-pay

## 2019-07-09 ENCOUNTER — Encounter: Payer: Self-pay | Admitting: Cardiothoracic Surgery

## 2019-07-09 ENCOUNTER — Ambulatory Visit (INDEPENDENT_AMBULATORY_CARE_PROVIDER_SITE_OTHER): Payer: 59 | Admitting: Cardiothoracic Surgery

## 2019-07-09 DIAGNOSIS — Z4889 Encounter for other specified surgical aftercare: Secondary | ICD-10-CM | POA: Diagnosis not present

## 2019-07-09 NOTE — Progress Notes (Signed)
PCP is Mikey College, NP Referring Provider is End, Harrell Gave, MD  No chief complaint on file.   HPI: Patient returns after having excision of a painful segment of sternal keloid over a week ago. Status post CABG for unstable angina and late 2019.  Skin sutures were removed by RN and skin separated.  Under sterile technique and local 1% lidocaine I placed 3 new 3-0 nylon skin sutures to reclose the upper sternal incision.  Past Medical History:  Diagnosis Date  . Anxiety   . Asthma    In cold weather (08/21/2018)  . CAD (coronary artery disease)    a. 08/2018 NSTEMI/Cath: LM 50d,LAD 80ost/p, 74m70d, LCX 90ost, OM1 50, OM3 70, OM4 80, LPDA small, RCA  99p/d, EF 30-35%; c. 08/2018 CABG x 4: LIMA->LAD, VG->RI, VG->OM, VG->RCA.  .Marland KitchenChronic combined systolic (congestive) and diastolic (congestive) heart failure ([place]    a. 08/2018 Echo: EF 40-45%, mid-apicalanteroseptal and apical HK. Gr2 DD. Mild AI; b. 08/2018 intraop TEE: EF 45-50%, antsept, infsept HK. Mild AI. Mildly dil Ao root. Trace MR.  . Family history of adverse reaction to anesthesia    "mother PONV; talked crazy/loud after anesthesia" (08/21/2018)  . GERD (gastroesophageal reflux disease)   . High cholesterol   . Ischemic cardiomyopathy    a. 08/2018 Echo: EF 40-45%; b. 08/2018 TEE: EF 45-50%.  . Seasonal allergies   . Tuberculosis ~ 1960  . Type II diabetes mellitus ([place]   . UTI (lower urinary tract infection)    "once; before hysterectomy" (08/21/2018)    Past Surgical History:  Procedure Laterality Date  . CORONARY ARTERY BYPASS GRAFT N/A 08/26/2018   Procedure: CORONARY ARTERY BYPASS GRAFTING (CABG) times four using left internal mammary artery and right leg saphenous vein grafts;  Surgeon: VIvin Poot MD;  [facility] [facility]  Service: Open Heart Surgery;  Laterality: N/A;  . GANGLION CYST EXCISION Left ~ 1960   neck  . LEFT HEART CATH AND CORONARY ANGIOGRAPHY N/A 08/21/2018   Procedure: LEFT HEART  CATH AND CORONARY ANGIOGRAPHY;  Surgeon: ENelva Bush MD;  [facility];  Service: Cardiovascular;  Laterality: N/A;  . TEE WITHOUT CARDIOVERSION N/A 08/26/2018   Procedure: TRANSESOPHAGEAL ECHOCARDIOGRAM (TEE);  Surgeon: VPrescott Gum PCollier Salina MD;  [place] [facility]  Service: Open Heart Surgery;  Laterality: N/A;  . TOTAL ABDOMINAL HYSTERECTOMY  2006    Family History  Problem Relation Age of Onset  . Cancer Sister   . Heart disease Neg Hx     Social History Social History   Tobacco Use  . Smoking status: Never Smoker  . Smokeless tobacco: Never Used  Substance Use Topics  . Alcohol use: Not Currently  . Drug use: Never    Current Outpatient Medications  Medication Sig Dispense Refill  . acetaminophen (TYLENOL) 500 MG tablet Take 2 tablets (1,000 mg total) by mouth every 6 (six) hours as needed. 30 tablet 0  . albuterol (PROVENTIL HFA;VENTOLIN HFA) 108 (90 Base) MCG/ACT inhaler Inhale 2 puffs into the lungs every 6 (six) hours as needed for wheezing or shortness of breath. 1 Inhaler 11  . aspirin EC 81 MG tablet Take 1 tablet (81 mg total) by mouth daily. 90 tablet 3  . atorvastatin (LIPITOR) 80 MG tablet TAKE 1 TABLET DAILY AT 6 P.M. 90 tablet 1  . blood glucose meter kit and supplies KIT Dispense based on patient and insurance preference. two times daily as directed. For E11.65 1 each 0  .  clopidogrel (PLAVIX) 75 MG tablet TAKE 1 TABLET DAILY 90 tablet 3  . glucose blood test strip Use as instructed 100 each 12  . Lancets (ONETOUCH ULTRASOFT) lancets TEST TWICE DAILY 100 each 3  . metFORMIN (GLUCOPHAGE) 500 MG tablet Take 2 tab in morning with meal, and take 1 tab in evening with meal 270 tablet 1  . metoprolol tartrate (LOPRESSOR) 25 MG tablet TAKE 1 TABLET TWICE A DAY 180 tablet 1   No current facility-administered medications for this visit.     Allergies  Allergen Reactions  . Shellfish Allergy Hives  . Avocado     UNSPECIFIED REACTION   . Advair  Diskus [Fluticasone-Salmeterol] Palpitations  . Cortisone Other (See Comments)    drowsiness  . Pecan Nut (Diagnostic) Itching  . Dani Gobble [Fish Allergy] Rash    Rash and itching of ears    Review of Systems  Back at work No angina No fever  There were no vitals taken for this visit. Physical Exam      Exam    General- alert and comfortable    Upper sternal wound appears clean but without any healing of the skin around the previous keloid excision    Neck- no JVD, no cervical adenopathy palpable, no carotid bruit   Lungs- clear without rales, wheezes   Cor- regular rate and rhythm, no murmur , gallop   Abdomen- soft, non-tender   Extremities - warm, non-tender, minimal edema   Neuro- oriented, appropriate, no focal weakness   Diagnostic Tests: None  Impression: Status post excision of painful segment of keloid scar status post sternotomy for CABG late 2019  Plan: The sutures will need to stay in for at least 2 weeks.  She will return for a office visit on October 16 to assess the incision and to possibly remove 1 of the sutures   Len Childs, MD [facility] [phone]

## 2019-07-24 ENCOUNTER — Other Ambulatory Visit: Payer: Self-pay

## 2019-07-24 ENCOUNTER — Ambulatory Visit (INDEPENDENT_AMBULATORY_CARE_PROVIDER_SITE_OTHER): Payer: 59

## 2019-07-24 DIAGNOSIS — Z23 Encounter for immunization: Secondary | ICD-10-CM | POA: Diagnosis not present

## 2019-07-25 ENCOUNTER — Ambulatory Visit (INDEPENDENT_AMBULATORY_CARE_PROVIDER_SITE_OTHER): Payer: 59 | Admitting: Cardiothoracic Surgery

## 2019-07-25 ENCOUNTER — Encounter: Payer: Self-pay | Admitting: Cardiothoracic Surgery

## 2019-07-25 VITALS — BP 145/70 | HR 66 | Temp 96.8°F | Resp 20 | Ht 61.0 in | Wt 147.6 lb

## 2019-07-25 DIAGNOSIS — L910 Hypertrophic scar: Secondary | ICD-10-CM

## 2019-07-25 DIAGNOSIS — Z09 Encounter for follow-up examination after completed treatment for conditions other than malignant neoplasm: Secondary | ICD-10-CM | POA: Diagnosis not present

## 2019-07-25 DIAGNOSIS — Z951 Presence of aortocoronary bypass graft: Secondary | ICD-10-CM

## 2019-07-25 NOTE — Progress Notes (Signed)
PCP is Mikey College, NP Referring Provider is End, Harrell Gave, MD  Chief Complaint  Patient presents with  . Routine Post Op    2 week f/u surgical wound check    HPI: Patient returns for suture removal.  She had a painful keloid at the upper part of her sternal incision following CABG earlier this year.  This was excised in the office and closed with interrupted nylon skin sutures.  The sutures are removed and the incision is healed well.  Steri-Strips were applied.   Past Medical History:  Diagnosis Date  . Anxiety   . Asthma    In cold weather (08/21/2018)  . CAD (coronary artery disease)    a. 08/2018 NSTEMI/Cath: LM 50d,LAD 80ost/p, 61m70d, LCX 90ost, OM1 50, OM3 70, OM4 80, LPDA small, RCA  99p/d, EF 30-35%; c. 08/2018 CABG x 4: LIMA->LAD, VG->RI, VG->OM, VG->RCA.  .Marland KitchenChronic combined systolic (congestive) and diastolic (congestive) heart failure ([place]    a. 08/2018 Echo: EF 40-45%, mid-apicalanteroseptal and apical HK. Gr2 DD. Mild AI; b. 08/2018 intraop TEE: EF 45-50%, antsept, infsept HK. Mild AI. Mildly dil Ao root. Trace MR.  . Family history of adverse reaction to anesthesia    "mother PONV; talked crazy/loud after anesthesia" (08/21/2018)  . GERD (gastroesophageal reflux disease)   . High cholesterol   . Ischemic cardiomyopathy    a. 08/2018 Echo: EF 40-45%; b. 08/2018 TEE: EF 45-50%.  . Seasonal allergies   . Tuberculosis ~ 1960  . Type II diabetes mellitus ([place]   . UTI (lower urinary tract infection)    "once; before hysterectomy" (08/21/2018)    Past Surgical History:  Procedure Laterality Date  . CORONARY ARTERY BYPASS GRAFT N/A 08/26/2018   Procedure: CORONARY ARTERY BYPASS GRAFTING (CABG) times four using left internal mammary artery and right leg saphenous vein grafts;  Surgeon: VIvin Poot MD;  [place] [facility]  Service: Open Heart Surgery;  Laterality: N/A;  . GANGLION CYST EXCISION Left ~ 1960   neck  . LEFT HEART CATH AND CORONARY  ANGIOGRAPHY N/A 08/21/2018   Procedure: LEFT HEART CATH AND CORONARY ANGIOGRAPHY;  Surgeon: ENelva Bush MD;  [facility];  Service: Cardiovascular;  Laterality: N/A;  . TEE WITHOUT CARDIOVERSION N/A 08/26/2018   Procedure: TRANSESOPHAGEAL ECHOCARDIOGRAM (TEE);  Surgeon: VPrescott Gum PCollier Salina MD;  [place] [place]  Service: Open Heart Surgery;  Laterality: N/A;  . TOTAL ABDOMINAL HYSTERECTOMY  2006    Family History  Problem Relation Age of Onset  . Cancer Sister   . Heart disease Neg Hx     Social History Social History   Tobacco Use  . Smoking status: Never Smoker  . Smokeless tobacco: Never Used  Substance Use Topics  . Alcohol use: Not Currently  . Drug use: Never    Current Outpatient Medications  Medication Sig Dispense Refill  . acetaminophen (TYLENOL) 500 MG tablet Take 2 tablets (1,000 mg total) by mouth every 6 (six) hours as needed. 30 tablet 0  . albuterol (PROVENTIL HFA;VENTOLIN HFA) 108 (90 Base) MCG/ACT inhaler Inhale 2 puffs into the lungs every 6 (six) hours as needed for wheezing or shortness of breath. 1 Inhaler 11  . aspirin EC 81 MG tablet Take 1 tablet (81 mg total) by mouth daily. 90 tablet 3  . atorvastatin (LIPITOR) 80 MG tablet TAKE 1 TABLET DAILY AT 6 P.M. 90 tablet 1  . blood glucose meter kit and supplies KIT Dispense based on patient and insurance  preference. two times daily as directed. For E11.65 1 each 0  . clopidogrel (PLAVIX) 75 MG tablet TAKE 1 TABLET DAILY 90 tablet 3  . glucose blood test strip Use as instructed 100 each 12  . Lancets (ONETOUCH ULTRASOFT) lancets TEST TWICE DAILY 100 each 3  . metFORMIN (GLUCOPHAGE) 500 MG tablet Take 2 tab in morning with meal, and take 1 tab in evening with meal 270 tablet 1  . metoprolol tartrate (LOPRESSOR) 25 MG tablet TAKE 1 TABLET TWICE A DAY 180 tablet 1   No current facility-administered medications for this visit.     Allergies  Allergen Reactions  . Shellfish Allergy Hives   . Avocado     UNSPECIFIED REACTION   . Advair Diskus [Fluticasone-Salmeterol] Palpitations  . Cortisone Other (See Comments)    drowsiness  . Pecan Nut (Diagnostic) Itching  . Salmon [Fish Allergy] Rash    Rash and itching of ears    Review of Systems  No angina Working full-time as a Mudlogger in the school system No fever  BP (!) 145/70 (BP Location: Right Arm)   Pulse 66   Temp (!) 96.8 F (36 C) (Skin)   Resp 20   Ht 5' 1" (1.549 m)   Wt 147 lb 9.6 oz (67 kg)   SpO2 97% Comment: RA  BMI 27.89 kg/m  Physical Exam Alert and comfortable Lungs clear Incision at the keloid site has healed and sutures are removed. Heart rhythm regular without murmur or gallop  Diagnostic Tests: None  Impression: Sutures removed, incision appears to be healing well.  She will leave the Steri-Strips on for 3 to 5 days.  Plan: Return if needed  Len Childs, MD [facility] [phone]

## 2019-08-06 ENCOUNTER — Other Ambulatory Visit: Payer: Self-pay

## 2019-08-06 ENCOUNTER — Ambulatory Visit (INDEPENDENT_AMBULATORY_CARE_PROVIDER_SITE_OTHER): Payer: 59

## 2019-08-06 DIAGNOSIS — I255 Ischemic cardiomyopathy: Secondary | ICD-10-CM | POA: Diagnosis not present

## 2019-08-06 DIAGNOSIS — I2510 Atherosclerotic heart disease of native coronary artery without angina pectoris: Secondary | ICD-10-CM

## 2019-09-18 ENCOUNTER — Other Ambulatory Visit: Payer: Self-pay | Admitting: Nurse Practitioner

## 2019-09-22 ENCOUNTER — Other Ambulatory Visit: Payer: Self-pay | Admitting: Nurse Practitioner

## 2019-09-22 DIAGNOSIS — E1165 Type 2 diabetes mellitus with hyperglycemia: Secondary | ICD-10-CM

## 2019-10-07 ENCOUNTER — Other Ambulatory Visit
Admission: RE | Admit: 2019-10-07 | Discharge: 2019-10-07 | Disposition: A | Discharge status: Home / Self Care | Payer: 59 | Source: Ambulatory Visit | Attending: Internal Medicine | Admitting: Internal Medicine

## 2019-10-07 DIAGNOSIS — I255 Ischemic cardiomyopathy: Secondary | ICD-10-CM

## 2019-10-07 DIAGNOSIS — I2510 Atherosclerotic heart disease of native coronary artery without angina pectoris: Secondary | ICD-10-CM

## 2019-10-07 DIAGNOSIS — E785 Hyperlipidemia, unspecified: Secondary | ICD-10-CM

## 2019-10-07 LAB — CBC
HCT: 39.9 % (ref 36.0–46.0)
Hemoglobin: 13.5 g/dL (ref 12.0–15.0)
MCH: 31 pg (ref 26.0–34.0)
MCHC: 33.8 g/dL (ref 30.0–36.0)
MCV: 91.5 fL (ref 80.0–100.0)
Platelets: 217 10*3/uL (ref 150–400)
RBC: 4.36 MIL/uL (ref 3.87–5.11)
RDW: 12.9 % (ref 11.5–15.5)
WBC: 6 10*3/uL (ref 4.0–10.5)
nRBC: 0 % (ref 0.0–0.2)

## 2019-10-07 LAB — LIPID PANEL
Cholesterol: 130 mg/dL (ref 0–200)
HDL: 42 mg/dL (ref >40)
LDL Cholesterol: 60 mg/dL (ref 0–99)
Total CHOL/HDL Ratio: 3.1 RATIO
Triglycerides: 142 mg/dL (ref <150)
VLDL: 28 mg/dL (ref 0–40)

## 2019-10-07 LAB — COMPREHENSIVE METABOLIC PANEL
ALT: 19 U/L (ref 0–44)
AST: 20 U/L (ref 15–41)
Albumin: 4.2 g/dL (ref 3.5–5.0)
Alkaline Phosphatase: 75 U/L (ref 38–126)
Anion gap: 11 (ref 5–15)
BUN: 25 mg/dL — ABNORMAL HIGH (ref 8–23)
CO2: 25 mmol/L (ref 22–32)
Calcium: 9.1 mg/dL (ref 8.9–10.3)
Chloride: 105 mmol/L (ref 98–111)
Creatinine, Ser: 0.78 mg/dL (ref 0.44–1.00)
GFR calc Af Amer: >60 mL/min (ref >60)
GFR calc non Af Amer: >60 mL/min (ref >60)
Glucose, Bld: 133 mg/dL — ABNORMAL HIGH (ref 70–99)
Potassium: 5.1 mmol/L (ref 3.5–5.1)
Sodium: 141 mmol/L (ref 135–145)
Total Bilirubin: 0.8 mg/dL (ref 0.3–1.2)
Total Protein: 7.6 g/dL (ref 6.5–8.1)

## 2019-11-21 ENCOUNTER — Other Ambulatory Visit: Payer: Self-pay

## 2019-11-21 ENCOUNTER — Encounter: Payer: Self-pay | Admitting: Family Medicine

## 2019-11-21 ENCOUNTER — Ambulatory Visit (INDEPENDENT_AMBULATORY_CARE_PROVIDER_SITE_OTHER): Payer: 59 | Admitting: Family Medicine

## 2019-11-21 VITALS — BP 182/60 | HR 75 | Temp 97.1°F | Ht 61.0 in | Wt 154.4 lb

## 2019-11-21 DIAGNOSIS — M7552 Bursitis of left shoulder: Secondary | ICD-10-CM | POA: Diagnosis not present

## 2019-11-21 DIAGNOSIS — J4520 Mild intermittent asthma, uncomplicated: Secondary | ICD-10-CM

## 2019-11-21 MED ORDER — DICLOFENAC SODIUM 1 % EX GEL: 2.0000 g | Freq: Three times a day (TID) | CUTANEOUS | 2 refills | Status: DC | PRN

## 2019-11-21 MED ORDER — ALBUTEROL SULFATE HFA 108 (90 BASE) MCG/ACT IN AERS: 2.0000 | INHALATION_SPRAY | Freq: Four times a day (QID) | RESPIRATORY_TRACT | 3 refills | Status: DC | PRN

## 2019-11-21 NOTE — Progress Notes (Signed)
Subjective:    Patient ID: Olivia Roth, female    DOB: 06-22-1956, 64 y.o.   MRN: [id number]  Olivia Roth is a 64 y.o. female presenting on 11/21/2019 for Arm Pain (left arm pain, with difficulty ROM, and daily duties. Unable to lift the arm above the head. x 7 days. The pain is gradually worsening over time. )   HPI   Left Shoulder Pain, chronic Reports symptoms originally started back in July 2020, now over past >7, describes deeper aching pain and sometimes throbbing mostly in L shoulder and some anteriorly, unsure of any provoking injury, recently x 2 weeks has had some worsening pain. Now impacting her activities and daily function more, limiting her putting coat on, lifting arm above shoulder, doing hair, and also will wake her up at night from sleep. - Improved with massage from husband - Tried Tylenol PRN infrequent dosing. Tried topical Vick's. She does not like to take any advil or stronger meds. She does not like injections - No prior history of shoulder injury or problem. - No known history of osteoarthritis or imaging on file. - Denies any issue with neck, has full range of motion. - Denies any chest pain, dyspnea, numbness tingling in left arm and hand  Asthma, mild intermittent Doing well. No exacerbation or flare up. She is due for refill albuterol inhaler  Health Maintenance: S/p 1st covid dose vaccine recently.  Depression screen [facility] 2/9 03/10/2019 10/24/2018 09/03/2018  Decreased Interest 0 0 1  Down, Depressed, Hopeless 0 0 1  PHQ - 2 Score 0 0 2  Altered sleeping - 0 1  Tired, decreased energy - 0 1  Change in appetite - 0 1  Feeling bad or failure about yourself  - 0 0  Trouble concentrating - 0 0  Moving slowly or fidgety/restless - 0 0  Suicidal thoughts - 0 0  PHQ-9 Score - 0 5  Difficult doing work/chores - Not difficult at all Not difficult at all    Social History   Tobacco Use  . Smoking status: Never Smoker  . Smokeless tobacco: Never Used    Substance Use Topics  . Alcohol use: Not Currently  . Drug use: Never    Review of Systems Per HPI unless specifically indicated above     Objective:    BP (!) 182/60 (BP Location: Right Arm, Patient Position: Sitting, Cuff Size: Normal)   Pulse 75   Temp (!) 97.1 F (36.2 C) (Oral)   Ht 5\' 1"  (1.549 m)   Wt 154 lb 6.4 oz (70 kg)   BMI 29.17 kg/m   Wt Readings from Last 3 Encounters:  11/21/19 154 lb 6.4 oz (70 kg)  07/25/19 147 lb 9.6 oz (67 kg)  07/02/19 145 lb (65.8 kg)    Physical Exam Vitals and nursing note reviewed.  Constitutional:      General: She is not in acute distress.    Appearance: She is well-developed. She is not diaphoretic.     Comments: Well-appearing, comfortable, cooperative  HENT:     Head: Normocephalic and atraumatic.  Eyes:     General:        Right eye: No discharge.        Left eye: No discharge.     Conjunctiva/sclera: Conjunctivae normal.  Cardiovascular:     Rate and Rhythm: Normal rate.  Pulmonary:     Effort: Pulmonary effort is normal.  Musculoskeletal:     Comments: Left Shoulder Inspection: Normal  appearance bilateral symmetrical Palpation: Mild -tender to palpation over anterior shoulder  ROM: Significantly reduced AROM forward flex to 90* and abduction to 90* or less and behind back internal rotation. R shoulder intact Special Testing: Rotator cuff testing negative for weakness with supraspinatus full can and empty can test but has provoked pain, Hawkin's AC impingement positive for mild Strength: Normal strength 5/5 flex/ext, ext rot / int rot, grip, rotator cuff str testing. Neurovascular: Distally intact pulses, sensation to light touch  Skin:    General: Skin is warm and dry.     Findings: No erythema or rash.  Neurological:     Mental Status: She is alert and oriented to person, place, and time.  Psychiatric:        Behavior: Behavior normal.     Comments: Well groomed, good eye contact, normal speech and thoughts     Results for orders placed or performed during the hospital encounter of 10/07/19  Lipid panel  Result Value Ref Range   Cholesterol 130 0 - 200 mg/dL   Triglycerides 161 <096 mg/dL   HDL 42 >04 mg/dL   Total CHOL/HDL Ratio 3.1 RATIO   VLDL 28 0 - 40 mg/dL   LDL Cholesterol 60 0 - 99 mg/dL  Comprehensive metabolic panel  Result Value Ref Range   Sodium 141 135 - 145 mmol/L   Potassium 5.1 3.5 - 5.1 mmol/L   Chloride 105 98 - 111 mmol/L   CO2 25 22 - 32 mmol/L   Glucose, Bld 133 (H) 70 - 99 mg/dL   BUN 25 (H) 8 - 23 mg/dL   Creatinine, Ser 5.40 0.44 - 1.00 mg/dL   Calcium 9.1 8.9 - 98.1 mg/dL   Total Protein 7.6 6.5 - 8.1 g/dL   Albumin 4.2 3.5 - 5.0 g/dL   AST 20 15 - 41 U/L   ALT 19 0 - 44 U/L   Alkaline Phosphatase 75 38 - 126 U/L   Total Bilirubin 0.8 0.3 - 1.2 mg/dL   GFR calc non Af Amer >60 >60 mL/min   GFR calc Af Amer >60 >60 mL/min   Anion gap 11 5 - 15  CBC  Result Value Ref Range   WBC 6.0 4.0 - 10.5 K/uL   RBC 4.36 3.87 - 5.11 MIL/uL   Hemoglobin 13.5 12.0 - 15.0 g/dL   HCT 19.1 47.8 - 29.5 %   MCV 91.5 80.0 - 100.0 fL   MCH 31.0 26.0 - 34.0 pg   MCHC 33.8 30.0 - 36.0 g/dL   RDW 62.1 30.8 - 65.7 %   Platelets 217 150 - 400 K/uL   nRBC 0.0 0.0 - 0.2 %      Assessment & Plan:   Problem List Items Addressed This Visit    None    Visit Diagnoses    Chronic bursitis of left shoulder    -  Primary   Relevant Medications   diclofenac Sodium (VOLTAREN) 1 % GEL      Consistent with subacute on chronic now L-shoulder bursitis vs rotator cuff tendinopathy with some reduced active ROM but without significant evidence of muscle tear (no weakness). No clear etiology of injury. 89 old patient with possible underlying arthritis - No imaging on file - Inadequate conservative therapy by her preference  Plan: 1. Start rx topical nsaid diclofenac 2-3 times daily PRN, goodrx given if not covered 2. May take Tylenol Ex Str 1-2 q 6 hr PRN 3. Relative rest but  keep shoulder mobile, demonstrated  ROM exercises, avoid heavy lifting 4. May try heating pad PRN 5. Follow-up 4-6 weeks if not improved for re-evaluation, consider referral to Physical Therapy, X-rays, and or subacromial steroid injection    Meds ordered this encounter  Medications  . diclofenac Sodium (VOLTAREN) 1 % GEL    Sig: Apply 2 g topically 3 (three) times daily as needed. For arthritis    Dispense:  100 g    Refill:  2      Follow up plan: Return in about 4 weeks (around 12/19/2019) for Left shoulder pain.   Nobie Putnam, [facility] 11/21/2019, 8:09 AM

## 2019-11-21 NOTE — Patient Instructions (Addendum)
Thank you for coming to the office today.  Most likely you have bursitis of your shoulder. This is inflammation of the shoulder joint caused most often by arthritis or wear and tear. Often it can flare up to cause bursitis due to repetitive activities or other triggers. It may take time to heal, possibly 2 to 6 weeks, and it is important to avoid over use of shoulder especially above head motions that can re-aggravate the problem.  Recommend trial of Anti-inflammatory with TOPICAL Diclofenac use 2-3 times a day as needed for pain - try to use regularly for first week - DO NOT TAKE any ibuprofen, aleve, motrin while you are taking this medicine  Recommend to start taking Tylenol Extra Strength 500mg  tabs - take 1 to 2 tabs per dose (max 1000mg ) every 6-8 hours for pain (take regularly, don't skip a dose for next 7 days), max 24 hour daily dose is 6 tablets or 3000mg . In the future you can repeat the same everyday Tylenol course for 1-2 weeks at a time.   If not improving as expected or keeps waking you up - call our office 1-2 weeks - we can add a muscle relaxant to help sleep.  Also we can refer you to Physical Therapy if just need to improve on range of motion.  Lastly if not improved by about 4-6 weeks - or just need treatment sooner - call and return for a Steroid Injection in shoulder.   Please schedule a Follow-up Appointment to: Return in about 4 weeks (around 12/19/2019) for Left shoulder pain.  If you have any other questions or concerns, please feel free to call the office or send a message through MyChart. You may also schedule an earlier appointment if necessary.  Additionally, you may be receiving a survey about your experience at our office within a few days to 1 week by e-mail or mail. We value your feedback.  , DO [facility], [facility]  Range of Motion Shoulder Exercises  Pendulum Circles - Lean with your good arm against a counter or table  for support [facility] forward with a wide stance (make sure your body is comfortable) - Your painful shoulder should hang down and feel "heavy" - Gently move your painful arm in small circles "clockwise" for several turns - Switch to "counterclockwise" for several turns - Early on keep circles narrow and move slowly - Later in rehab, move in larger circles and faster movement   Wall Crawl - Stand close (about 1-2 ft away) to a wall, facing it directly - Reach out with your arm of painful shoulder and place fingers (not palm) on wall - You should make contact with wall at your waist level - Slowly walk your fingers up the wall. Stay in contact with wall entire time, do not remove fingers - Keep walking fingers up wall until you reach shoulder level - You may feel tightening or mild discomfort, once you reach a height that causes pain or if you are already above your shoulder height then stop. Repeat from starting position. - Early on stand closer to wall, move fingers slowly, and stay at or below shoulder level - Later in rehab, stand farther away from wall (fingertips), move fingers quicker, go above shoulder level

## 2019-12-12 ENCOUNTER — Ambulatory Visit: Payer: 59 | Admitting: Family Medicine

## 2020-01-15 ENCOUNTER — Ambulatory Visit: Payer: 59 | Admitting: Family Medicine

## 2020-02-11 ENCOUNTER — Encounter: Payer: Self-pay | Admitting: Internal Medicine

## 2020-02-11 ENCOUNTER — Other Ambulatory Visit: Payer: Self-pay

## 2020-02-11 ENCOUNTER — Ambulatory Visit (INDEPENDENT_AMBULATORY_CARE_PROVIDER_SITE_OTHER): Payer: 59 | Admitting: Internal Medicine

## 2020-02-11 VITALS — BP 120/84 | HR 69 | Ht 61.0 in | Wt 161.1 lb

## 2020-02-11 DIAGNOSIS — E785 Hyperlipidemia, unspecified: Secondary | ICD-10-CM

## 2020-02-11 DIAGNOSIS — I2510 Atherosclerotic heart disease of native coronary artery without angina pectoris: Secondary | ICD-10-CM | POA: Diagnosis not present

## 2020-02-11 DIAGNOSIS — I10 Essential (primary) hypertension: Secondary | ICD-10-CM

## 2020-02-11 DIAGNOSIS — I255 Ischemic cardiomyopathy: Secondary | ICD-10-CM | POA: Diagnosis not present

## 2020-02-11 NOTE — Progress Notes (Signed)
Follow-up Outpatient Visit Date: 02/11/2020  Primary Care Provider: Olin Hauser, DO [address] [address]  Chief Complaint: Follow-up coronary artery disease  HPI:  Olivia Roth is a 64 y.o. female with history of coronary artery disease status post urgent CABG in the setting of NSTEMI in 08/2018, hyperlipidemia, diabetes mellitus, GERD, and anxiety, who presents for follow-up of coronary artery disease.  I last saw her in 06/2019, at which time Olivia Roth was feeling well other than sensitivity along the upper margin of her median sternotomy incision.  She saw Dr. Prescott Gum later that day and underwent excision of sternal keloid in the office under local anesthesia.  Today, Olivia Roth reports that she has been doing well from a heart standpoint. She has not had any chest pain, shortness of breath, palpitations, lightheadedness, or edema. She still has some sensitivity along her median sternotomy incision. She has also noticed some intermittent left shoulder pain and difficulty with abduction at the shoulder. She is applying a topical treatment recommended by Dr. Parks Ranger for treatment of possible bursitis.  --------------------------------------------------------------------------------------------------  Past Medical History:  Diagnosis Date  . Anxiety   . Asthma    In cold weather (08/21/2018)  . CAD (coronary artery disease)    a. 08/2018 NSTEMI/Cath: LM 50d,LAD 80ost/p, 32m70d, LCX 90ost, OM1 50, OM3 70, OM4 80, LPDA small, RCA  99p/d, EF 30-35%; c. 08/2018 CABG x 4: LIMA->LAD, VG->RI, VG->OM, VG->RCA.  .Marland KitchenChronic combined systolic (congestive) and diastolic (congestive) heart failure ([place]    a. 08/2018 Echo: EF 40-45%, mid-apicalanteroseptal and apical HK. Gr2 DD. Mild AI; b. 08/2018 intraop TEE: EF 45-50%, antsept, infsept HK. Mild AI. Mildly dil Ao root. Trace MR.  . Family history of adverse reaction to anesthesia    "mother PONV; talked crazy/loud after  anesthesia" (08/21/2018)  . GERD (gastroesophageal reflux disease)   . High cholesterol   . Ischemic cardiomyopathy    a. 08/2018 Echo: EF 40-45%; b. 08/2018 TEE: EF 45-50%.  . Seasonal allergies   . Tuberculosis ~ 1960  . Type II diabetes mellitus ([place]   . UTI (lower urinary tract infection)    "once; before hysterectomy" (08/21/2018)   Past Surgical History:  Procedure Laterality Date  . CARDIAC CATHETERIZATION    . CORONARY ARTERY BYPASS GRAFT N/A 08/26/2018   Procedure: CORONARY ARTERY BYPASS GRAFTING (CABG) times four using left internal mammary artery and right leg saphenous vein grafts;  Surgeon: VIvin Poot MD;  [place]  Service: Open Heart Surgery;  Laterality: N/A;  . GANGLION CYST EXCISION Left ~ 1960   neck  . LEFT HEART CATH AND CORONARY ANGIOGRAPHY N/A 08/21/2018   Procedure: LEFT HEART CATH AND CORONARY ANGIOGRAPHY;  Surgeon: ENelva Bush MD;  [place] [facility] [facility];  Service: Cardiovascular;  Laterality: N/A;  . TEE WITHOUT CARDIOVERSION N/A 08/26/2018   Procedure: TRANSESOPHAGEAL ECHOCARDIOGRAM (TEE);  Surgeon: VPrescott Gum PCollier Salina MD;  [place]  Service: Open Heart Surgery;  Laterality: N/A;  . TOTAL ABDOMINAL HYSTERECTOMY  2006    Current Meds  Medication Sig  . acetaminophen (TYLENOL) 500 MG tablet Take 2 tablets (1,000 mg total) by mouth every 6 (six) hours as needed.  .Marland Kitchenalbuterol (VENTOLIN HFA) 108 (90 Base) MCG/ACT inhaler Inhale 2 puffs into the lungs every 6 (six) hours as needed for wheezing or shortness of breath.  .Marland Kitchenaspirin EC 81 MG tablet Take 1 tablet (81 mg total) by mouth daily.  .Marland Kitchenatorvastatin (  LIPITOR) 80 MG tablet TAKE 1 TABLET DAILY AT 6 P.M.  . blood glucose meter kit and supplies KIT Dispense based on patient and insurance preference. two times daily as directed. For E11.65  . diclofenac Sodium (VOLTAREN) 1 % GEL Apply 2 g topically 3 (three) times daily as needed. For arthritis  . glucose blood test strip Use as  instructed  . Lancets (ONETOUCH ULTRASOFT) lancets TEST TWICE DAILY  . metFORMIN (GLUCOPHAGE) 500 MG tablet TAKE 2 TABLETS IN THE MORNING AND 1 TABLET IN THE EVENING WITH MEALS  . metoprolol tartrate (LOPRESSOR) 25 MG tablet TAKE 1 TABLET TWICE A DAY    Allergies: Shellfish allergy, Avocado, Advair diskus [fluticasone-salmeterol], Cortisone, Pecan nut (diagnostic), and Salmon [fish allergy]  Social History   Tobacco Use  . Smoking status: Never Smoker  . Smokeless tobacco: Never Used  Substance Use Topics  . Alcohol use: Not Currently  . Drug use: Never    Family History  Problem Relation Age of Onset  . Cancer Sister   . Heart disease Neg Hx     Review of Systems: A 12-system review of systems was performed and was negative except as noted in the HPI.  --------------------------------------------------------------------------------------------------  Physical Exam: BP 120/84 (BP Location: Left Arm, Patient Position: Sitting, Cuff Size: Normal)   Pulse 69   Ht _0  (1.549 m)   Wt 161 lb 2 oz (73.1 kg)   SpO2 98%   BMI 30.44 kg/m   Repeat BP 126/74  General: NAD. HEENT: No conjunctival pallor or scleral icterus. Facemask in place. Neck: No JVD or HJR. Lungs: Normal work of breathing. Clear to auscultation bilaterally without wheezes or crackles. Heart: Regular rate and rhythm without murmurs, rubs, or gallops. Median sternotomy well-healed with small keloid present. Abd: Bowel sounds present. Soft, NT/ND. Ext: No lower extremity edema. Radial, PT, and DP pulses are 2+ bilaterally.  EKG: Normal sinus rhythm with poor R wave progression. No significant change from prior tracing on 07/02/2019.  Lab Results  Component Value Date   WBC 6.0 10/07/2019   HGB 13.5 10/07/2019   HCT 39.9 10/07/2019   MCV 91.5 10/07/2019   PLT 217 10/07/2019    Lab Results  Component Value Date   NA 141 10/07/2019   K 5.1 10/07/2019   CL 105 10/07/2019   CO2 25 10/07/2019   BUN 25  (H) 10/07/2019   CREATININE 0.78 10/07/2019   GLUCOSE 133 (H) 10/07/2019   ALT 19 10/07/2019    Lab Results  Component Value Date   CHOL 130 10/07/2019   HDL 42 10/07/2019   LDLCALC 60 10/07/2019   TRIG 142 10/07/2019   CHOLHDL 3.1 10/07/2019    --------------------------------------------------------------------------------------------------  ASSESSMENT AND PLAN: Coronary artery disease: Olivia Roth continues to do well status post CABG in the setting of NSTEMI in 08/2018. We will continue aspirin, atorvastatin, and metoprolol for secondary prevention.  Ischemic cardiomyopathy: LVEF improved to 50-5% on follow-up echo in October. Olivia Roth appears euvolemic with NYHA class I symptoms. Continue current medications.  Hyperlipidemia: Lipids well controlled on last check. Continue current dose of atorvastatin.  Hypertension: Blood pressure initially mildly elevated but better on recheck. Continue current dose of metoprolol with target blood pressure less than 130/80 given history of diabetes mellitus.  Follow-up: Return to clinic in 6 months.  Nelva Bush, MD 02/11/2020 11:04 AM

## 2020-02-11 NOTE — Patient Instructions (Signed)

## 2020-03-16 ENCOUNTER — Other Ambulatory Visit: Payer: Self-pay | Admitting: Nurse Practitioner

## 2020-03-20 ENCOUNTER — Other Ambulatory Visit: Payer: Self-pay | Admitting: Nurse Practitioner

## 2020-03-20 DIAGNOSIS — E1165 Type 2 diabetes mellitus with hyperglycemia: Secondary | ICD-10-CM

## 2020-03-20 NOTE — Telephone Encounter (Signed)
Requested medication (s) are due for refill today: yes  Requested medication (s) are on the active medication list: yes  Last refill: 09/22/19  Future visit scheduled: no  Notes to clinic:  overdue for labs   Requested Prescriptions  Pending Prescriptions Disp Refills   metFORMIN (GLUCOPHAGE) 500 MG tablet [Pharmacy Med Name: METFORMIN HCL TABS 500MG] 270 tablet 3    Sig: TAKE 2 TABLETS IN THE MORNING AND 1 TABLET IN THE EVENING WITH MEALS      Endocrinology:  Diabetes - Biguanides Failed - 03/20/2020  1:01 AM      Failed - HBA1C is between 0 and 7.9 and within 180 days    Hgb A1c MFr Bld  Date Value Ref Range Status  03/12/2019 6.4 (H) <5.7 % of total Hgb Final    Comment:    For someone without known diabetes, a hemoglobin  A1c value between 5.7% and 6.4% is consistent with prediabetes and should be confirmed with a  follow-up test. . For someone with known diabetes, a value <7% indicates that their diabetes is well controlled. A1c targets should be individualized based on duration of diabetes, age, comorbid conditions, and other considerations. . This assay result is consistent with an increased risk of diabetes. . Currently, no consensus exists regarding use of hemoglobin A1c for diagnosis of diabetes for children. .           Passed - Cr in normal range and within 360 days    Creat  Date Value Ref Range Status  03/05/2019 0.90 0.50 - 0.99 mg/dL Final    Comment:    For patients >34 years of age, the reference limit for Creatinine is approximately 13% higher for people identified as African-American. .    Creatinine, Ser  Date Value Ref Range Status  10/07/2019 0.78 0.44 - 1.00 mg/dL Final          Passed - eGFR in normal range and within 360 days    GFR, Est African American  Date Value Ref Range Status  03/05/2019 78 > OR = 60 mL/min/1.73m Final   GFR calc Af Amer  Date Value Ref Range Status  10/07/2019 >60 >60 mL/min Final   GFR, Est Non  African American  Date Value Ref Range Status  03/05/2019 68 > OR = 60 mL/min/1.777mFinal   GFR calc non Af Amer  Date Value Ref Range Status  10/07/2019 >60 >60 mL/min Final          Passed - Valid encounter within last 6 months    Recent Outpatient Visits           4 months ago [facility] bursitis of left shoulder   [facility]   1 year ago Type 2 diabetes mellitus with other specified complication, without long-term current use of insulin ([facility]  [facility]ought[facility]   1 year ago Uncontrolled type 2 diabetes mellitus with hyperglycemia ([facility]  [facility] PumaLaJerrel IvoryNP   1 year ago Uncontrolled type 2 diabetes mellitus with hyperglycemia ([facility]  [facility] PumaLaJer[facility] IvoryNP   1 year ago Uncontrolled type 2 diabetes mellitus with hyperglycemia, without long-term current use of insulin ([facility]  [facility]errily[facility] PumaLaJerrel IvoryNP       Future Appointments             In 5 months End, ChHarrell GaveMD  [place], [facility]

## 2020-06-14 ENCOUNTER — Other Ambulatory Visit: Payer: Self-pay | Admitting: Family Medicine

## 2020-06-15 NOTE — Telephone Encounter (Signed)
Requested medication (s) are due for refill today: Yes  Requested medication (s) are on the active medication list: Yes  Last refill:  03/16/20  Future visit scheduled: No  Notes to clinic:  Unable to refill, appointment needed per protocol, last OV for chronic conditions 03/10/19.     Requested Prescriptions  Pending Prescriptions Disp Refills   metoprolol tartrate (LOPRESSOR) 25 MG tablet [Pharmacy Med Name: METOPROLOL TARTRATE TABS 25MG ] 180 tablet 3    Sig: TAKE 1 TABLET TWICE A DAY      Cardiovascular:  Beta Blockers Failed - 06/15/2020 10:13 AM      Failed - Valid encounter within last 6 months    Recent Outpatient Visits           6 months ago Chronic bursitis of left shoulder   [facility] [place], [place], DO   1 year ago Type 2 diabetes mellitus with other specified complication, without long-term current use of insulin (HCC)   [facility] [place], [place], DO   1 year ago Uncontrolled type 2 diabetes mellitus with hyperglycemia [facility])   [facility], Kyung Rudd, NP   1 year ago Uncontrolled type 2 diabetes mellitus with hyperglycemia [facility])   [facility], [facility], NP   1 year ago Uncontrolled type 2 diabetes mellitus with hyperglycemia, without long-term current use of insulin [facility])   [facility], Kyung Rudd, NP       Future Appointments             In 2 months End, Alison Stalling, MD [facility], [facility]            Passed - Last BP in normal range    BP Readings from Last 1 Encounters:  02/11/20 120/84          Passed - Last Heart Rate in normal range    Pulse Readings from Last 1 Encounters:  02/11/20 69            atorvastatin (LIPITOR) 80 MG tablet [Pharmacy Med Name: ATORVASTATIN TABS 80MG ] 90 tablet 3    Sig: TAKE 1 TABLET DAILY AT 6 P.M.      Cardiovascular:  Antilipid - Statins Passed - 06/15/2020 10:13  AM      Passed - Total Cholesterol in normal range and within 360 days    Cholesterol, Total  Date Value Ref Range Status  10/04/2018 120 100 - 199 mg/dL Final   Cholesterol  Date Value Ref Range Status  10/07/2019 130 0 - 200 mg/dL Final          Passed - LDL in normal range and within 360 days    LDL Cholesterol (Calc)  Date Value Ref Range Status  07/04/2017  mg/dL (calc) Final    Comment:    . LDL cholesterol not calculated. Triglyceride levels greater than 400 mg/dL invalidate calculated LDL results. . Reference range: <100 . Desirable range <100 mg/dL for primary prevention;   <70 mg/dL for patients with CHD or diabetic patients  with > or = 2 CHD risk factors. 10/09/2019 LDL-C is now calculated using the Martin-Hopkins  calculation, which is a validated novel method providing  better accuracy than the Friedewald equation in the  estimation of LDL-C.  07/06/2017 et al. Marland Kitchen. Horald Pollen): 2061-2068  (http://education.[employer].com/faq/FAQ164)    LDL Calculated  Date Value Ref Range Status  10/04/2018 50 0 - 99 mg/dL Final   LDL Cholesterol  Date Value Ref Range Status  10/07/2019 60 0 - 99 mg/dL Final    Comment:           Total Cholesterol/HDL:CHD Risk Coronary Heart Disease Risk Table                     Men   Women  1/2 Average Risk   3.4   3.3  Average Risk       5.0   4.4  2 X Average Risk   9.6   7.1  3 X Average Risk  23.4   11.0        Use the calculated Patient Ratio above and the CHD Risk Table to determine the patient's CHD Risk.        ATP III CLASSIFICATION (LDL):  <100     mg/dL   Optimal  324-401  mg/dL   Near or Above                    Optimal  130-159  mg/dL   Borderline  027-253  mg/dL   High  >664     mg/dL   Very High Performed at [facility], [address] [place], [place] [address]           Passed - HDL in normal range and within 360 days    HDL  Date Value Ref Range Status  10/07/2019 42 >40 mg/dL Final   42/59/5638 33 (L) >39 mg/dL Final          Passed - Triglycerides in normal range and within 360 days    Triglycerides  Date Value Ref Range Status  10/07/2019 142 <150 mg/dL Final          Passed - Patient is not pregnant      Passed - Valid encounter within last 12 months    Recent Outpatient Visits           6 months ago Chronic bursitis of left shoulder   [facility] [place], Netta Neat, DO   1 year ago Type 2 diabetes mellitus with other specified complication, without long-term current use of insulin [facility])   [facility] Smitty Cords, DO   1 year ago Uncontrolled type 2 diabetes mellitus with hyperglycemia [facility])   [facility] Kyung Rudd, Alison Stalling, NP   1 year ago Uncontrolled type 2 diabetes mellitus with hyperglycemia [facility])   [facility], Alison Stalling, NP   1 year ago Uncontrolled type 2 diabetes mellitus with hyperglycemia, without long-term current use of insulin [facility])   [facility] Kyung Rudd, Alison Stalling, NP       Future Appointments             In 2 months End, Cristal Deer, MD [facility], [facility]

## 2020-06-15 NOTE — Telephone Encounter (Signed)
Patient called, unable to leave message due to mailbox not set up. Patient will need an OV, CPE, last OV 03/10/19.

## 2020-06-18 ENCOUNTER — Other Ambulatory Visit: Payer: Self-pay | Admitting: Family Medicine

## 2020-06-18 DIAGNOSIS — E1165 Type 2 diabetes mellitus with hyperglycemia: Secondary | ICD-10-CM

## 2020-06-18 NOTE — Telephone Encounter (Signed)
Requested  medications are  due for refill today yes  Requested medications are on the active medication list yes  Last refill 6/14  Last visit 03/2019  Future visit scheduled no  Notes to clinic Failed protocol of a visit within 6 months, please assess.

## 2020-06-18 NOTE — Telephone Encounter (Signed)
Sent 90 day rx only to mail order. No refills until has apt.  Last Diabetes visit 03/2019  Please contact patient to schedule apt for Diabetes within next 3 months. She will need Diabetic follow-up visit in person visit for A1c before end of year 2021.  Saralyn Pilar, DO [facility] 06/18/2020, 4:24 PM

## 2020-07-05 ENCOUNTER — Other Ambulatory Visit: Payer: Self-pay | Admitting: Nurse Practitioner

## 2020-07-05 NOTE — Telephone Encounter (Signed)
Courtesy refill  

## 2020-07-21 ENCOUNTER — Encounter: Payer: Self-pay | Admitting: Family Medicine

## 2020-07-21 ENCOUNTER — Ambulatory Visit (INDEPENDENT_AMBULATORY_CARE_PROVIDER_SITE_OTHER): Payer: BC Managed Care – PPO | Admitting: Family Medicine

## 2020-07-21 ENCOUNTER — Other Ambulatory Visit: Payer: Self-pay

## 2020-07-21 VITALS — BP 163/65 | HR 70 | Temp 98.9°F | Resp 17 | Ht 61.0 in | Wt 156.8 lb

## 2020-07-21 DIAGNOSIS — R300 Dysuria: Secondary | ICD-10-CM | POA: Diagnosis not present

## 2020-07-21 MED ORDER — SULFAMETHOXAZOLE-TRIMETHOPRIM 800-160 MG PO TABS: 1.0000 | ORAL_TABLET | Freq: Two times a day (BID) | ORAL | 0 refills | Status: AC

## 2020-07-21 MED ORDER — PHENAZOPYRIDINE HCL 100 MG PO TABS: 100.0000 mg | ORAL_TABLET | Freq: Three times a day (TID) | ORAL | 0 refills | Status: AC | PRN

## 2020-07-21 NOTE — Patient Instructions (Signed)
We have sent your urine to the lab for culture and we will contact you when we receive the results.  I have sent in a prescription for Bactrim DS to take 1 tablet 2x per day for the next 5 days.  Remember to always wipe front to back and empty your bladder pre- and post-intercourse to prevent UTIs.    Drink plenty of fluids.  I have sent in a prescription for Pyridium to take 1 tablet up to 3x a day as needed for urinary discomfort.  As we discussed. this will change the color of your urine to an orange/red, this is normal.    You may also experience some urinary dribbling, be sure to wear a pantyliner to protect your clothing from staining.  We will plan to see you back in 4 weeks for diabetes follow up visit  You will receive a survey after today's visit either digitally by e-mail or paper by USPS mail. Your experiences and feedback matter to [place].  Please respond so we know how we are doing as we provide care for you.  Call us with any questions/concerns/needs.  It is my goal to be available to you for your health concerns.  Thanks for choosing me to be a partner in your healthcare needs!  Charlaine Dalton, FNP-C Family Nurse Practitioner [facility] Phone: [phone]

## 2020-07-21 NOTE — Assessment & Plan Note (Signed)
Dysuria with visible hematuria, enough urine to send for culture and unable to perform POCT U/A for evaluation due to insufficient sample.  Will send for culture and treat with bactrim and pyridium.  Plan: 1. Begin bactrim DS 1 tablet 2x a day for the next 5 days 2. Begin pyridium 100mg  TID PRN x 2 days for urinary discomfort 3. RTC PRN

## 2020-07-21 NOTE — Progress Notes (Signed)
Subjective:    Patient ID: Olivia Roth, female    DOB: 30-Jan-1956, 64 y.o.   MRN: [id number]  Olivia Roth is a 64 y.o. female presenting on 07/21/2020 for Dysuria (urinary urgency, frequency, abdominal pain, gross hematuria and dysuria x 1 day )   HPI  Ms. Moncrief presents to clinic for concerns of dysuria urinary urgency, hematuria, frequency with lower abdominal pain x 1 day.  Denies fevers, n/v/d.  Has not taken anything for her symptoms.  Depression screen [facility] 2/9 03/10/2019 10/24/2018 09/03/2018  Decreased Interest 0 0 1  Down, Depressed, Hopeless 0 0 1  PHQ - 2 Score 0 0 2  Altered sleeping - 0 1  Tired, decreased energy - 0 1  Change in appetite - 0 1  Feeling bad or failure about yourself  - 0 0  Trouble concentrating - 0 0  Moving slowly or fidgety/restless - 0 0  Suicidal thoughts - 0 0  PHQ-9 Score - 0 5  Difficult doing work/chores - Not difficult at all Not difficult at all    Social History   Tobacco Use  . Smoking status: Never Smoker  . Smokeless tobacco: Never Used  Vaping Use  . Vaping Use: Never used  Substance Use Topics  . Alcohol use: Not Currently  . Drug use: Never    Review of Systems  Constitutional: Negative.   HENT: Negative.   Eyes: Negative.   Respiratory: Negative.   Cardiovascular: Negative.   Gastrointestinal: Negative.   Endocrine: Negative.   Genitourinary: Positive for dysuria, frequency, hematuria and urgency. Negative for decreased urine volume, difficulty urinating, dyspareunia, enuresis, flank pain, genital sores, menstrual problem, pelvic pain, vaginal bleeding, vaginal discharge and vaginal pain.  Musculoskeletal: Negative.   Skin: Negative.   Allergic/Immunologic: Negative.   Neurological: Negative.   Hematological: Negative.   Psychiatric/Behavioral: Negative.    Per HPI unless specifically indicated above     Objective:    BP (!) 163/65 (BP Location: Right Arm, Patient Position: Sitting, Cuff Size: Normal)    Pulse 70   Temp 98.9 F (37.2 C) (Oral)   Resp 17   Ht 5\' 1"  (1.549 m)   Wt 156 lb 12.8 oz (71.1 kg)   SpO2 100%   BMI 29.63 kg/m   Wt Readings from Last 3 Encounters:  07/21/20 156 lb 12.8 oz (71.1 kg)  02/11/20 161 lb 2 oz (73.1 kg)  11/21/19 154 lb 6.4 oz (70 kg)    Physical Exam Vitals and nursing note reviewed.  Constitutional:      General: She is not in acute distress.    Appearance: Normal appearance. She is well-developed, well-groomed and normal weight. She is not ill-appearing or toxic-appearing.  HENT:     Head: Normocephalic and atraumatic.     Nose:     Comments: 01/19/20 is in place, covering mouth and nose. Eyes:     General: Lids are normal. Vision grossly intact.        Right eye: No discharge.        Left eye: No discharge.     Extraocular Movements: Extraocular movements intact.     Conjunctiva/sclera: Conjunctivae normal.     Pupils: Pupils are equal, round, and reactive to light.  Cardiovascular:     Rate and Rhythm: Normal rate and regular rhythm.     Pulses: Normal pulses.          Dorsalis pedis pulses are 2+ on the right side and 2+ on the  left side.     Heart sounds: Normal heart sounds. No murmur heard.  No friction rub. No gallop.   Pulmonary:     Effort: Pulmonary effort is normal. No respiratory distress.     Breath sounds: Normal breath sounds.  Abdominal:     General: Abdomen is flat. Bowel sounds are normal. There is no distension.     Palpations: Abdomen is soft.     Tenderness: There is abdominal tenderness in the suprapubic area. There is no right CVA tenderness, left CVA tenderness, guarding or rebound.  Musculoskeletal:     Right lower leg: No edema.     Left lower leg: No edema.  Skin:    General: Skin is warm and dry.     Capillary Refill: Capillary refill takes less than 2 seconds.  Neurological:     General: No focal deficit present.     Mental Status: She is alert and oriented to person, place, and time.  Psychiatric:          Attention and Perception: Attention and perception normal.        Mood and Affect: Mood and affect normal.        Speech: Speech normal.        Behavior: Behavior normal. Behavior is cooperative.        Thought Content: Thought content normal.        Cognition and Memory: Cognition and memory normal.        Judgment: Judgment normal.    Results for orders placed or performed during the hospital encounter of 10/07/19  Lipid panel  Result Value Ref Range   Cholesterol 130 0 - 200 mg/dL   Triglycerides 063 <016 mg/dL   HDL 42 >01 mg/dL   Total CHOL/HDL Ratio 3.1 RATIO   VLDL 28 0 - 40 mg/dL   LDL Cholesterol 60 0 - 99 mg/dL  Comprehensive metabolic panel  Result Value Ref Range   Sodium 141 135 - 145 mmol/L   Potassium 5.1 3.5 - 5.1 mmol/L   Chloride 105 98 - 111 mmol/L   CO2 25 22 - 32 mmol/L   Glucose, Bld 133 (H) 70 - 99 mg/dL   BUN 25 (H) 8 - 23 mg/dL   Creatinine, Ser 0.93 0.44 - 1.00 mg/dL   Calcium 9.1 8.9 - 23.5 mg/dL   Total Protein 7.6 6.5 - 8.1 g/dL   Albumin 4.2 3.5 - 5.0 g/dL   AST 20 15 - 41 U/L   ALT 19 0 - 44 U/L   Alkaline Phosphatase 75 38 - 126 U/L   Total Bilirubin 0.8 0.3 - 1.2 mg/dL   GFR calc non Af Amer >60 >60 mL/min   GFR calc Af Amer >60 >60 mL/min   Anion gap 11 5 - 15  CBC  Result Value Ref Range   WBC 6.0 4.0 - 10.5 K/uL   RBC 4.36 3.87 - 5.11 MIL/uL   Hemoglobin 13.5 12.0 - 15.0 g/dL   HCT 57.3 36 - 46 %   MCV 91.5 80.0 - 100.0 fL   MCH 31.0 26.0 - 34.0 pg   MCHC 33.8 30.0 - 36.0 g/dL   RDW 22.0 25.4 - 27.0 %   Platelets 217 150 - 400 K/uL   nRBC 0.0 0.0 - 0.2 %      Assessment & Plan:   Problem List Items Addressed This Visit      Other   Dysuria - Primary    Dysuria with visible hematuria,  enough urine to send for culture and unable to perform POCT U/A for evaluation due to insufficient sample.  Will send for culture and treat with bactrim and pyridium.  Plan: 1. Begin bactrim DS 1 tablet 2x a day for the next 5 days 2.  Begin pyridium 100mg  TID PRN x 2 days for urinary discomfort 3. RTC PRN      Relevant Medications   phenazopyridine (PYRIDIUM) 100 MG tablet   sulfamethoxazole-trimethoprim (BACTRIM DS) 800-160 MG tablet   Other Relevant Orders   Urine Culture      Meds ordered this encounter  Medications  . phenazopyridine (PYRIDIUM) 100 MG tablet    Sig: Take 1 tablet (100 mg total) by mouth 3 (three) times daily as needed for up to 2 days for pain.    Dispense:  6 tablet    Refill:  0  . sulfamethoxazole-trimethoprim (BACTRIM DS) 800-160 MG tablet    Sig: Take 1 tablet by mouth 2 (two) times daily for 5 days.    Dispense:  10 tablet    Refill:  0    Follow up plan: Return in about 4 weeks (around 08/18/2020) for DM, A1C F/U and Flu Shot.   13/07/2020, FNP Family Nurse Practitioner [facility] 07/21/2020, 11:26 AM

## 2020-07-22 LAB — URINE CULTURE
MICRO NUMBER:: 11068257
SPECIMEN QUALITY:: ADEQUATE

## 2020-08-16 NOTE — Progress Notes (Signed)
Follow-up Outpatient Visit Date: 08/18/2020  Primary Care Provider: Olin Hauser, DO [address] [address]  Chief Complaint: Follow-up coronary artery disease  HPI:  Olivia Roth is a 64 y.o. female with history of coronary artery disease status post urgent CABG in the setting ofNSTEMI in 08/2018, hyperlipidemia, diabetes mellitus, GERD, and anxiety, who presents for follow-up of coronary artery disease.  I last saw her in early May, at which she was doing well from a heart standpoint.  She noted continued intermittent sensitivity overlying her median sternotomy.  She also complained of left shoulder pain.  Today, Olivia Roth reports that she is feeling fairly well.  She notes some cold intolerance.  She denies chest pain, shortness of breath, palpitations, and lightheadedness.  Her blood pressure is usually well controlled with systolic readings in the 315X.  It was higher last month in the setting of a UTI, but is now returning to normal.  She does not exercise regularly but remains very active, trying to get 03-6999 steps per day.  --------------------------------------------------------------------------------------------------  Past Medical History:  Diagnosis Date  . Anxiety   . Asthma    In cold weather (08/21/2018)  . CAD (coronary artery disease)    a. 08/2018 NSTEMI/Cath: LM 50d,LAD 80ost/p, 16m70d, LCX 90ost, OM1 50, OM3 70, OM4 80, LPDA small, RCA  99p/d, EF 30-35%; c. 08/2018 CABG x 4: LIMA->LAD, VG->RI, VG->OM, VG->RCA.  .Marland KitchenChronic combined systolic (congestive) and diastolic (congestive) heart failure ([place]    a. 08/2018 Echo: EF 40-45%, mid-apicalanteroseptal and apical HK. Gr2 DD. Mild AI; b. 08/2018 intraop TEE: EF 45-50%, antsept, infsept HK. Mild AI. Mildly dil Ao root. Trace MR.  . Family history of adverse reaction to anesthesia    "mother PONV; talked crazy/loud after anesthesia" (08/21/2018)  . GERD (gastroesophageal reflux disease)   . High  cholesterol   . Ischemic cardiomyopathy    a. 08/2018 Echo: EF 40-45%; b. 08/2018 TEE: EF 45-50%.  . Seasonal allergies   . Tuberculosis ~ 1960  . Type II diabetes mellitus ([place]   . UTI (lower urinary tract infection)    "once; before hysterectomy" (08/21/2018)   Past Surgical History:  Procedure Laterality Date  . CARDIAC CATHETERIZATION    . CORONARY ARTERY BYPASS GRAFT N/A 08/26/2018   Procedure: CORONARY ARTERY BYPASS GRAFTING (CABG) times four using left internal mammary artery and right leg saphenous vein grafts;  Surgeon: VIvin Poot MD;  [place]  Service: Open Heart Surgery;  Laterality: N/A;  . GANGLION CYST EXCISION Left ~ 1960   neck  . LEFT HEART CATH AND CORONARY ANGIOGRAPHY N/A 08/21/2018   Procedure: LEFT HEART CATH AND CORONARY ANGIOGRAPHY;  Surgeon: ENelva Bush MD;  [place] [facility];  Service: Cardiovascular;  Laterality: N/A;  . TEE WITHOUT CARDIOVERSION N/A 08/26/2018   Procedure: TRANSESOPHAGEAL ECHOCARDIOGRAM (TEE);  Surgeon: VPrescott Gum PCollier Salina MD;  [place]  Service: Open Heart Surgery;  Laterality: N/A;  . TOTAL ABDOMINAL HYSTERECTOMY  2006    Current Meds  Medication Sig  . acetaminophen (TYLENOL) 500 MG tablet Take 2 tablets (1,000 mg total) by mouth every 6 (six) hours as needed.  .Marland Kitchenalbuterol (VENTOLIN HFA) 108 (90 Base) MCG/ACT inhaler Inhale 2 puffs into the lungs every 6 (six) hours as needed for wheezing or shortness of breath.  .Marland Kitchenaspirin EC 81 MG tablet Take 1 tablet (81 mg total) by mouth daily.  .Marland Kitchenatorvastatin (LIPITOR) 80 MG tablet Take 1  tablet (80 mg total) by mouth daily.  . blood glucose meter kit and supplies KIT Dispense based on patient and insurance preference. two times daily as directed. For E11.65  . glucose blood test strip Use as instructed  . Lancets (ONETOUCH ULTRASOFT) lancets TEST TWICE DAILY  . metoprolol tartrate (LOPRESSOR) 25 MG tablet Take 1 tablet (25 mg total) by mouth 2 (two) times  daily.  . [DISCONTINUED] atorvastatin (LIPITOR) 80 MG tablet TAKE 1 TABLET BY MOUTH ONCE DAILY  . [DISCONTINUED] metFORMIN (GLUCOPHAGE) 500 MG tablet TAKE 2 TABLETS IN THE MORNING AND 1 TABLET IN THE EVENING WITH MEALS  . [DISCONTINUED] metoprolol tartrate (LOPRESSOR) 25 MG tablet TAKE 1 TABLET BY MOUTH TWICE DAILY    Allergies: Shellfish allergy, Avocado, Advair diskus [fluticasone-salmeterol], Cortisone, Pecan nut (diagnostic), and Salmon [fish allergy]  Social History   Tobacco Use  . Smoking status: Never Smoker  . Smokeless tobacco: Never Used  Vaping Use  . Vaping Use: Never used  Substance Use Topics  . Alcohol use: Not Currently  . Drug use: Never    Family History  Problem Relation Age of Onset  . Cancer Sister   . Heart disease Neg Hx     Review of Systems: A 12-system review of systems was performed and was negative except as noted in the HPI.  --------------------------------------------------------------------------------------------------  Physical Exam: BP 136/72 (BP Location: Left Arm, Patient Position: Sitting, Cuff Size: Normal)   Pulse 71   Ht '5\' 1"'  (1.549 m)   Wt 161 lb (73 kg)   SpO2 97%   BMI 30.42 kg/m   General: NAD. Neck: No JVD or HJR. Lungs: Clear to auscultation bilaterally that wheezes or crackles. Heart: Regular rate and rhythm no murmurs, rubs, or gallops. Abdomen: Soft, nontender, nondistended. Extremities: No lower extremity edema.  EKG: Normal sinus rhythm with inferior infarct and poor R wave progression.  No significant change from 02/11/2020.  Lab Results  Component Value Date   WBC 4.5 08/18/2020   HGB 12.7 08/18/2020   HCT 39.3 08/18/2020   MCV 93 08/18/2020   PLT 203 08/18/2020    Lab Results  Component Value Date   NA 139 08/18/2020   K 4.6 08/18/2020   CL 103 08/18/2020   CO2 22 08/18/2020   BUN 24 08/18/2020   CREATININE 0.93 08/18/2020   GLUCOSE 266 (H) 08/18/2020   ALT 16 08/18/2020    Lab Results    Component Value Date   CHOL 137 08/18/2020   HDL 39 (L) 08/18/2020   LDLCALC 72 08/18/2020   TRIG 151 (H) 08/18/2020   CHOLHDL 3.5 08/18/2020    --------------------------------------------------------------------------------------------------  ASSESSMENT AND PLAN: Coronary artery disease: Olivia Roth continues to do well without recurrent angina.  We will continue her current medications for secondary prevention.  I will check a CBC, CMP, and lipid panel today.  Cold intolerance: Present for at least the last few months.  I will check a TSH and CBC today.  Hypertension: Blood pressure mildly elevated today but typically better at home.  I have asked Olivia Roth to monitor her blood pressures at home and to alert [place] if it is consistently above 130/80.  Hyperlipidemia: LDL just above goal on last check.  We will repeat a lipid panel and CMP today.  We will plan to continue atorvastatin 80 mg daily, though if LDL is still above goal, we may need to consider transitioning to rosuvastatin or adding ezetimibe.  Type 2 diabetes mellitus: Continue Metformin, with ongoing  management per Dr. Parks Ranger.  Last A1c was at goal at 6.7.  Follow-up: Return to clinic in 6 months.  Nelva Bush, MD 08/19/2020 9:22 AM

## 2020-08-18 ENCOUNTER — Other Ambulatory Visit: Payer: Self-pay

## 2020-08-18 ENCOUNTER — Ambulatory Visit (INDEPENDENT_AMBULATORY_CARE_PROVIDER_SITE_OTHER): Payer: BC Managed Care – PPO | Admitting: Family Medicine

## 2020-08-18 ENCOUNTER — Ambulatory Visit (INDEPENDENT_AMBULATORY_CARE_PROVIDER_SITE_OTHER): Payer: BC Managed Care – PPO | Admitting: Internal Medicine

## 2020-08-18 ENCOUNTER — Encounter: Payer: Self-pay | Admitting: Family Medicine

## 2020-08-18 ENCOUNTER — Encounter: Payer: Self-pay | Admitting: Internal Medicine

## 2020-08-18 VITALS — BP 136/62 | HR 65 | Temp 97.3°F | Resp 16 | Ht 61.0 in | Wt 161.0 lb

## 2020-08-18 VITALS — BP 136/72 | HR 71 | Ht 61.0 in | Wt 161.0 lb

## 2020-08-18 DIAGNOSIS — E1169 Type 2 diabetes mellitus with other specified complication: Secondary | ICD-10-CM

## 2020-08-18 DIAGNOSIS — E785 Hyperlipidemia, unspecified: Secondary | ICD-10-CM | POA: Diagnosis not present

## 2020-08-18 DIAGNOSIS — I2510 Atherosclerotic heart disease of native coronary artery without angina pectoris: Secondary | ICD-10-CM

## 2020-08-18 DIAGNOSIS — I10 Essential (primary) hypertension: Secondary | ICD-10-CM

## 2020-08-18 DIAGNOSIS — M7552 Bursitis of left shoulder: Secondary | ICD-10-CM | POA: Diagnosis not present

## 2020-08-18 DIAGNOSIS — R6889 Other general symptoms and signs: Secondary | ICD-10-CM

## 2020-08-18 DIAGNOSIS — E1159 Type 2 diabetes mellitus with other circulatory complications: Secondary | ICD-10-CM | POA: Diagnosis not present

## 2020-08-18 LAB — POCT UA - MICROALBUMIN: Microalbumin Ur, POC: 0 mg/L

## 2020-08-18 LAB — POCT GLYCOSYLATED HEMOGLOBIN (HGB A1C): Hemoglobin A1C: 6.7 % — AB (ref 4.0–5.6)

## 2020-08-18 MED ORDER — METOPROLOL TARTRATE 25 MG PO TABS
25.0000 mg | ORAL_TABLET | Freq: Two times a day (BID) | ORAL | 2 refills | Status: DC
Start: 2020-08-18 — End: 2021-04-28

## 2020-08-18 MED ORDER — METFORMIN HCL 500 MG PO TABS: ORAL_TABLET | ORAL | 3 refills | Status: DC

## 2020-08-18 MED ORDER — ATORVASTATIN CALCIUM 80 MG PO TABS
80.0000 mg | ORAL_TABLET | Freq: Every day | ORAL | 2 refills | Status: DC
Start: 2020-08-18 — End: 2021-04-28

## 2020-08-18 NOTE — Progress Notes (Signed)
Subjective:    Patient ID: Olivia Roth, female    DOB: Nov 08, 1955, 64 y.o.   MRN: [id number]  Olivia Roth is a 64 y.o. female presenting on 08/18/2020 for Hypertension   HPI   Left Shoulder Pain, chronic Last visit 11/2019 initial treatment. Interval update, still has issues with range of motion. Has done exercises and conservative care, medicines, has declined steroid injection. impacting her activities and daily function more, limiting her putting coat on, lifting arm above shoulder, doing hair, and also will wake her up at night from sleep. - Tried Tylenol PRN infrequent dosing. Tried topical Vick's. She does not like to take any advil or stronger meds. She does not like injections - No prior history of shoulder injury or problem. - No known history of osteoarthritis or imaging on file. - Denies any issue with neck - Denies any chest pain, dyspnea, numbness tingling in left arm and hand  CHRONIC DM, Type 2: Reports major improvement in lifestyle - she has overhaul diet and inc activity Meds: Metformin 500mg  x 2 in AM and x 1 in PM Reports good compliance. Tolerating well w/o side-effects Currently not on ACEi / ARB  Lifestyle: - Diet (major improved low carb diet - Exercise (improving walking exercise regimen) - [name] vision done 1-2 years ago, needs to return. - Denies neuropathy Denies hypoglycemia, polyuria, visual changes, numbness or tingling.   Health Maintenance: UTD COVID vaccine and booster, 08/13/20 Will return for Flu Shot  Declines Mammogram / Colonoscopy.  S/p hysterectomy, not indicated for pap.  Depression screen [facility] 2/9 08/18/2020 03/10/2019 10/24/2018  Decreased Interest 0 0 0  Down, Depressed, Hopeless 0 0 0  PHQ - 2 Score 0 0 0  Altered sleeping - - 0  Tired, decreased energy - - 0  Change in appetite - - 0  Feeling bad or failure about yourself  - - 0  Trouble concentrating - - 0  Moving slowly or fidgety/restless - - 0  Suicidal thoughts - -  0  PHQ-9 Score - - 0  Difficult doing work/chores - - Not difficult at all    Social History   Tobacco Use  . Smoking status: Never Smoker  . Smokeless tobacco: Never Used  Vaping Use  . Vaping Use: Never used  Substance Use Topics  . Alcohol use: Not Currently  . Drug use: Never    Review of Systems Per HPI unless specifically indicated above     Objective:    BP 136/62 (BP Location: Left Arm, Cuff Size: Normal)   Pulse 65   Temp (!) 97.3 F (36.3 C) (Temporal)   Resp 16   Ht 5\' 1"  (1.549 m)   Wt 161 lb (73 kg)   SpO2 99%   BMI 30.42 kg/m   Wt Readings from Last 3 Encounters:  08/18/20 161 lb (73 kg)  08/18/20 161 lb (73 kg)  07/21/20 156 lb 12.8 oz (71.1 kg)    Physical Exam Vitals and nursing note reviewed.  Constitutional:      General: She is not in acute distress.    Appearance: She is well-developed. She is not diaphoretic.     Comments: Well-appearing, comfortable, cooperative  HENT:     Head: Normocephalic and atraumatic.  Eyes:     General:        Right eye: No discharge.        Left eye: No discharge.     Conjunctiva/sclera: Conjunctivae normal.  Neck:  Thyroid: No thyromegaly.  Cardiovascular:     Rate and Rhythm: Normal rate and regular rhythm.     Heart sounds: Normal heart sounds. No murmur heard.   Pulmonary:     Effort: Pulmonary effort is normal. No respiratory distress.     Breath sounds: Normal breath sounds. No wheezing or rales.  Musculoskeletal:     Cervical back: Normal range of motion and neck supple.     Right lower leg: No edema.     Left lower leg: No edema.     Comments: Left Shoulder Inspection: Normal appearance bilateral symmetrical Palpation: Non-tender to palpation over anterior, lateral, or posterior shoulder  ROM: REDUCED range of motion forward flex and abduction, has some intact internal rotation, some discomfort without significant pain on motion Special Testing: unable to fully test rotator cuff due to ROM  but has overall good strength Strength: Normal strength 5/5 flex/ext, ext rot / int rot, grip, rotator cuff str testing. Neurovascular: Distally intact pulses, sensation to light touch   Lymphadenopathy:     Cervical: No cervical adenopathy.  Skin:    General: Skin is warm and dry.     Findings: No erythema or rash.  Neurological:     Mental Status: She is alert and oriented to person, place, and time.  Psychiatric:        Behavior: Behavior normal.     Comments: Well groomed, good eye contact, normal speech and thoughts      Diabetic Foot Exam - Simple   Simple Foot Form Diabetic Foot exam was performed with the following findings: Yes 08/18/2020  1:39 PM  Visual Inspection No deformities, no ulcerations, no other skin breakdown bilaterally: Yes Sensation Testing Intact to touch and monofilament testing bilaterally: Yes Pulse Check Posterior Tibialis and Dorsalis pulse intact bilaterally: Yes Comments     Results for orders placed or performed in visit on 08/18/20  POCT HgB A1C  Result Value Ref Range   Hemoglobin A1C 6.7 (A) 4.0 - 5.6 %  POCT UA - Microalbumin  Result Value Ref Range   Microalbumin Ur, POC 0 mg/L   Recent Labs    08/18/20 1349  HGBA1C 6.7*       Assessment & Plan:   Problem List Items Addressed This Visit    Type 2 diabetes mellitus with vascular disease (HCC) - Primary    Well-controlled DM with A1c 6.7 Resolved hypoglycemia. No hyperglycemia Complications - hyperlipidemia, CAD history of NSTEMI, GERD - increases risk of future cardiovascular complications    Plan:  1. Continue current therapy - Metformin 500mg  x 2 in AM and x 1 in PM 2. Encourage improved lifestyle - low carb, low sugar diet, reduce portion size, continue improving regular exercise 3. Check CBG , bring log to next visit for review 4. Continue ASA, Statin      Relevant Medications   metFORMIN (GLUCOPHAGE) 500 MG tablet   Other Relevant Orders   POCT HgB A1C  (Completed)   POCT UA - Microalbumin (Completed)   Coronary artery disease involving native coronary artery of native heart without angina pectoris    Followed by Cardiology Apt today On med management       Other Visit Diagnoses    Chronic bursitis of left shoulder       Relevant Orders   Ambulatory referral to Physical Therapy      #Chronic shoulder bursitis L Concern with reduced ROM, at risk of future frozen shoulder No evidence of significant rotator cuff  muscle weakness or pain Continue conservative care, keep on ROM exercises Referral to [facility] PT for dedicated shoulder therapy - may consider return for injection if need or ortho     Meds ordered this encounter  Medications  . metFORMIN (GLUCOPHAGE) 500 MG tablet    Sig: Take 2 tablets in morning with meal and take 1 tablet in evening with meal    Dispense:  270 tablet    Refill:  3      Follow up plan: Return in about 6 months (around 02/15/2021) for 6 month follow-up DM A1c, HTN, Chronic Shoulder.   Saralyn Pilar, DO [facility] 08/18/2020, 1:32 PM

## 2020-08-18 NOTE — Patient Instructions (Signed)
Medication Instructions:  Your physician recommends that you continue on your current medications as directed. Please refer to the Current Medication list given to you today.  *If you need a refill on your cardiac medications before your next appointment, please call your pharmacy*   Lab Work: Your physician recommends that you return for lab work in: TODAY - CBC, LIPID, TSH, CMP.  If you have labs (blood work) drawn today and your tests are completely normal, you will receive your results only by: Marland Kitchen MyChart Message (if you have MyChart) OR . A paper copy in the mail If you have any lab test that is abnormal or we need to change your treatment, we will call you to review the results.   Testing/Procedures: none  Follow-Up: At [facility], you and your health needs are our priority.  As part of our continuing mission to provide you with exceptional heart care, we have created designated Provider Care Teams.  These Care Teams include your primary Cardiologist (physician) and Advanced Practice Providers (APPs -  Physician Assistants and Nurse Practitioners) who all work together to provide you with the care you need, when you need it.  We recommend signing up for the patient portal called "MyChart".  Sign up information is provided on this After Visit Summary.  MyChart is used to connect with patients for Virtual Visits (Telemedicine).  Patients are able to view lab/test results, encounter notes, upcoming appointments, etc.  Non-urgent messages can be sent to your provider as well.   To learn more about what you can do with MyChart, go to [email or web address].    Your next appointment:   6 month(s)  The format for your next appointment:   In person  Provider:   You may see Yvonne Kendall, MD or one of the following Advanced Practice Providers on your designated Care Team:    Nicolasa Ducking, NP  Eula Listen, PA-C  Marisue Ivan, PA-C  [place] [place], [place]

## 2020-08-18 NOTE — Patient Instructions (Addendum)
Thank you for coming to the office today.  Your provider would like to you have your annual eye exam. Please contact your current eye doctor or here are some good options for you to contact.   [facility] [address] [place], [place] [address] Phone: [phone]  Have them send [place] a copy of the diabetic report.  -----  Upcoming Flu shot when ready.  -------------------------------------------------------  Recent Labs    08/18/20 1349  HGBA1C 6.7*    Refilled Metformin.   We can consider shoulder injection into bursa or referral to Orthopedic if not improved on shoulder.  Referral today to Physical Therapy - at [facility] stay tuned.  Please schedule a Follow-up Appointment to: Return in about 6 months (around 02/15/2021) for 6 month follow-up DM A1c, HTN, Chronic Shoulder.  If you have any other questions or concerns, please feel free to call the office or send a message through MyChart. You may also schedule an earlier appointment if necessary.  Additionally, you may be receiving a survey about your experience at our office within a few days to 1 week by e-mail or mail. We value your feedback.  Saralyn Pilar, DO [facility], [place]

## 2020-08-19 ENCOUNTER — Encounter: Payer: Self-pay | Admitting: Internal Medicine

## 2020-08-19 DIAGNOSIS — R6889 Other general symptoms and signs: Secondary | ICD-10-CM | POA: Insufficient documentation

## 2020-08-19 LAB — COMPREHENSIVE METABOLIC PANEL
ALT: 16 IU/L (ref 0–32)
AST: 20 IU/L (ref 0–40)
Albumin/Globulin Ratio: 2 (ref 1.2–2.2)
Albumin: 4.5 g/dL (ref 3.8–4.8)
Alkaline Phosphatase: 112 IU/L (ref 44–121)
BUN/Creatinine Ratio: 26 (ref 12–28)
BUN: 24 mg/dL (ref 8–27)
Bilirubin Total: 0.4 mg/dL (ref 0.0–1.2)
CO2: 22 mmol/L (ref 20–29)
Calcium: 9.4 mg/dL (ref 8.7–10.3)
Chloride: 103 mmol/L (ref 96–106)
Creatinine, Ser: 0.93 mg/dL (ref 0.57–1.00)
GFR calc Af Amer: 75 mL/min/{1.73_m2} (ref >59)
GFR calc non Af Amer: 65 mL/min/{1.73_m2} (ref >59)
Globulin, Total: 2.2 g/dL (ref 1.5–4.5)
Glucose: 266 mg/dL — ABNORMAL HIGH (ref 65–99)
Potassium: 4.6 mmol/L (ref 3.5–5.2)
Sodium: 139 mmol/L (ref 134–144)
Total Protein: 6.7 g/dL (ref 6.0–8.5)

## 2020-08-19 LAB — CBC
Hematocrit: 39.3 % (ref 34.0–46.6)
Hemoglobin: 12.7 g/dL (ref 11.1–15.9)
MCH: 30 pg (ref 26.6–33.0)
MCHC: 32.3 g/dL (ref 31.5–35.7)
MCV: 93 fL (ref 79–97)
Platelets: 203 10*3/uL (ref 150–450)
RBC: 4.24 x10E6/uL (ref 3.77–5.28)
RDW: 12.7 % (ref 11.7–15.4)
WBC: 4.5 10*3/uL (ref 3.4–10.8)

## 2020-08-19 LAB — LIPID PANEL
Chol/HDL Ratio: 3.5 ratio (ref 0.0–4.4)
Cholesterol, Total: 137 mg/dL (ref 100–199)
HDL: 39 mg/dL — ABNORMAL LOW (ref >39)
LDL Chol Calc (NIH): 72 mg/dL (ref 0–99)
Triglycerides: 151 mg/dL — ABNORMAL HIGH (ref 0–149)
VLDL Cholesterol Cal: 26 mg/dL (ref 5–40)

## 2020-08-19 LAB — TSH: TSH: 1.85 u[IU]/mL (ref 0.450–4.500)

## 2020-08-19 NOTE — Assessment & Plan Note (Signed)
Well-controlled DM with A1c 6.7 Resolved hypoglycemia. No hyperglycemia Complications - hyperlipidemia, CAD history of NSTEMI, GERD - increases risk of future cardiovascular complications    Plan:  1. Continue current therapy - Metformin 500mg  x 2 in AM and x 1 in PM 2. Encourage improved lifestyle - low carb, low sugar diet, reduce portion size, continue improving regular exercise 3. Check CBG , bring log to next visit for review 4. Continue ASA, Statin

## 2020-08-19 NOTE — Assessment & Plan Note (Signed)
Followed by Cardiology Apt today On med management

## 2020-08-20 ENCOUNTER — Telehealth: Payer: Self-pay | Admitting: *Deleted

## 2020-08-20 NOTE — Telephone Encounter (Signed)
Results called to pt. Pt verbalized understanding. She does not want to take any more medications at this time. She would like to work on her diet and exercise and reassess in 6 months at her follow up appointment. Advised her on the benefit of adding Zetia but she still defers at this time. Routing to Dr End to make him aware.

## 2020-08-20 NOTE — Telephone Encounter (Signed)
-----   Message from Yvonne Kendall, MD sent at 08/20/2020  7:51 AM EST ----- Please let Olivia Roth know that her blood counts, kidney function, liver function, and electrolytes are normal.  Her blood sugar was moderately elevated at the time of her lab draw.  Her LDL (bad cholesterol) was also slightly above goal at 72 (goal less than 70, ideally below 50).  I suggest that she begin taking ezetimibe 10 mg daily in addition to her current dose of atorvastatin 80 mg daily.  We should recheck a lipid panel and ALT in about 3 months.

## 2020-08-24 ENCOUNTER — Ambulatory Visit: Payer: BC Managed Care – PPO

## 2020-08-30 ENCOUNTER — Other Ambulatory Visit: Payer: Self-pay

## 2020-08-30 ENCOUNTER — Ambulatory Visit: Payer: BC Managed Care – PPO

## 2020-08-30 ENCOUNTER — Ambulatory Visit: Payer: BC Managed Care – PPO | Attending: Family Medicine

## 2020-08-30 DIAGNOSIS — M25612 Stiffness of left shoulder, not elsewhere classified: Secondary | ICD-10-CM | POA: Diagnosis not present

## 2020-08-30 DIAGNOSIS — G8929 Other chronic pain: Secondary | ICD-10-CM

## 2020-08-30 DIAGNOSIS — M25512 Pain in left shoulder: Secondary | ICD-10-CM | POA: Diagnosis not present

## 2020-08-30 NOTE — Therapy (Signed)
[facility] [address] [place], [address] Phone: [phone]   Fax:  [phone]  Physical Therapy Evaluation  Patient Details  Name: Olivia Roth MRN: [id number] Date of Birth: Sep 17, 1956 Referring Provider (PT): Althea Charon MD   Encounter Date: 08/30/2020   PT End of Session - 08/30/20 0941    Visit Number 1    Number of Visits 13    Date for PT Re-Evaluation 10/11/20    Authorization Type 1/10    PT Start Time 0900    PT Stop Time 0945    PT Time Calculation (min) 45 min    Activity Tolerance Patient tolerated treatment well    Behavior During Therapy [facility] for tasks assessed/performed           Past Medical History:  Diagnosis Date  . Anxiety   . Asthma    In cold weather (08/21/2018)  . CAD (coronary artery disease)    a. 08/2018 NSTEMI/Cath: LM 50d,LAD 80ost/p, 44m 70d, LCX 90ost, OM1 50, OM3 70, OM4 80, LPDA small, RCA  99p/d, EF 30-35%; c. 08/2018 CABG x 4: LIMA->LAD, VG->RI, VG->OM, VG->RCA.  Marland Kitchen Chronic combined systolic (congestive) and diastolic (congestive) heart failure (HCC)    a. 08/2018 Echo: EF 40-45%, mid-apicalanteroseptal and apical HK. Gr2 DD. Mild AI; b. 08/2018 intraop TEE: EF 45-50%, antsept, infsept HK. Mild AI. Mildly dil Ao root. Trace MR.  . Family history of adverse reaction to anesthesia    "mother PONV; talked crazy/loud after anesthesia" (08/21/2018)  . GERD (gastroesophageal reflux disease)   . High cholesterol   . Ischemic cardiomyopathy    a. 08/2018 Echo: EF 40-45%; b. 08/2018 TEE: EF 45-50%.  . Seasonal allergies   . Tuberculosis ~ 1960  . Type II diabetes mellitus (HCC)   . UTI (lower urinary tract infection)    "once; before hysterectomy" (08/21/2018)    Past Surgical History:  Procedure Laterality Date  . CARDIAC CATHETERIZATION    . CORONARY ARTERY BYPASS GRAFT N/A 08/26/2018   Procedure: CORONARY ARTERY BYPASS GRAFTING (CABG) times four using left  internal mammary artery and right leg saphenous vein grafts;  Surgeon: Kerin Perna, MD;  Location: [facility] OR;  Service: Open Heart Surgery;  Laterality: N/A;  . GANGLION CYST EXCISION Left ~ 1960   neck  . LEFT HEART CATH AND CORONARY ANGIOGRAPHY N/A 08/21/2018   Procedure: LEFT HEART CATH AND CORONARY ANGIOGRAPHY;  Surgeon: Yvonne Kendall, MD;  Location: [facility] INVASIVE CV LAB;  Service: Cardiovascular;  Laterality: N/A;  . TEE WITHOUT CARDIOVERSION N/A 08/26/2018   Procedure: TRANSESOPHAGEAL ECHOCARDIOGRAM (TEE);  Surgeon: Donata Clay, Theron Arista, MD;  Location: [facility] OR;  Service: Open Heart Surgery;  Laterality: N/A;  . TOTAL ABDOMINAL HYSTERECTOMY  2006    There were no vitals filed for this visit.    Subjective Assessment - 08/30/20 0934    Subjective Patient reports increased L shoulder pain from insidous onset 2 years prior. Patient states she has increased difficulty with practically all UE movements with specific difficulty with reaching overhead, reaching behind her back, lifting her arm up to the side. Patient states at rest, the pain is minimal; no major pain noted. However, she reports that when lifting her arm, she on average gets a 5/10 pain along the lateral aspect of her affected L shoulder. Patient states she would like to address these limitations to regain use of her UE as she feels she can not use her L shoulder functionally  for lifting and reaching tasks.    Pertinent History CABG, MI in 2019    Limitations Lifting    Diagnostic tests none    Patient Stated Goals improve use of UE    Currently in Pain? Yes    Pain Score 0-No pain   5/10 worst   Pain Location Shoulder    Pain Orientation Left    Pain Descriptors / Indicators Aching    Pain Type Chronic pain    Pain Onset More than a month ago    Pain Frequency Intermittent              [facility] PT Assessment - 08/30/20 0952      Assessment   Medical Diagnosis L shoulder pain    Referring Provider (PT) Althea Charon MD     Onset Date/Surgical Date 10/09/18    Hand Dominance Right    Next MD Visit unknown    Prior Therapy none      Balance Screen   Has the patient fallen in the past 6 months No    Has the patient had a decrease in activity level because of a fear of falling?  Yes    Is the patient reluctant to leave their home because of a fear of falling?  No      Home [employer] information [employer] residence      Prior Function   Level of Independence Independent    Vocation Full time employment    Vocation Requirements walking, sitting    Leisure reading      Cognition   Overall Cognitive Status Within Functional Limits for tasks assessed      Observation/Other Assessments   Observations Seated in guarded position    Focus on Therapeutic Outcomes (FOTO)  55      Sensation   Light Touch Not tested      Functional Tests   Functional tests --   reaching - Increased pain     Posture/Postural Control   Posture/Postural Control Postural limitations    Postural Limitations Rounded Shoulders    Posture Comments Increased FHP      ROM / Strength   AROM / PROM / Strength AROM;PROM;Strength      AROM   Overall AROM Comments Pain with all movements    AROM Assessment Site Shoulder    Right/Left Shoulder Left;Right    Right Shoulder Extension 40 Degrees    Right Shoulder Flexion 170 Degrees    Right Shoulder ABduction 170 Degrees    Right Shoulder Internal Rotation --   WNL   Right Shoulder External Rotation --   WNL   Left Shoulder Extension 5 Degrees    Left Shoulder Flexion 78 Degrees    Left Shoulder ABduction 65 Degrees    Left Shoulder Internal Rotation 45 Degrees    Left Shoulder External Rotation 40 Degrees      PROM   Overall PROM Comments Pain with all movements    PROM Assessment Site Shoulder    Right/Left Shoulder Left    Left Shoulder Flexion 115 Degrees    Left Shoulder ABduction 90 Degrees      Strength   Strength Assessment Site Shoulder    Right/Left  Shoulder Left;Right    Right Shoulder Flexion 5/5    Right Shoulder Extension 5/5    Right Shoulder ABduction 5/5    Right Shoulder Internal Rotation 5/5    Right Shoulder External Rotation 5/5    Left Shoulder Flexion 3-/5  Left Shoulder Extension 3/5    Left Shoulder ABduction 3-/5    Left Shoulder Internal Rotation 3-/5    Left Shoulder External Rotation 3/5      Palpation   Palpation comment TTP: Middle delt, triceps      Special Tests    Special Tests --          Shoulder mobilization with patient positioned in supine: Hypomobile in all directions  Objective measurements completed on examination: See above findings.    TREATMENT Therapeutic Exercise Shoulder dowel AAROM in supine -- x 10 Seated dowel AAROM -- x 10  Seated self distraction -- x 5 with 10 sec holds    Performed exercises to improve AROM       PT Education - 08/30/20 0941    Education Details form/technique with exercises; POC; HEP: shoulder AAROM in supine, shoulder distration in sitting    Person(s) Educated Patient    Methods Explanation;Demonstration    Comprehension Verbalized understanding;Returned demonstration               PT Long Term Goals - 08/30/20 0945      PT LONG TERM GOAL #1   Title Patient will improve Foto score from 55-70 to indicate significant improvement in shoulder function.    Baseline 55    Time 6    Period Weeks    Status New    Target Date 10/11/20      PT LONG TERM GOAL #2   Title Patient will improve her worst pain score from a 5 out of 10 to a 2 out of 10 to indicate significant improvement in shoulder pain based on the visual analog scale.    Baseline 5/10    Time 6    Period Weeks    Status New    Target Date 10/11/20      PT LONG TERM GOAL #3   Title Patient will be independent with exercise performance to further benefit from therapy after discharge.    Baseline depemdemt for form and technique    Time 6    Period Weeks    Status New     Target Date 10/11/20      PT LONG TERM GOAL #4   Title Patient will have full shoulder flexion active range of motion on the left shoulder compared to right for greater functional use of left upper extremity.    Baseline 78 degrees    Time 6    Period Weeks    Status New                  Plan - 08/30/20 0942    Clinical Impression Statement Patient is a 64 year old right-hand-dominant female presenting with left shoulder pain.  Patient demonstrates increased left shoulder dysfunction as indicated by poor range of motion and strength scores throughout the entire shoulder motions tested.  Patient symptoms align with a possible adhesive capsulitis however able to regain motion after performing and assessing shoulder joint motion.  Joint motion of left shoulder limited compared to unaffected side.  Patient demonstrates strength, range of motion, and functional use of left shoulder.  Patient will benefit from further skilled therapy focused on improving shoulder limitations to return to prior level of function.    Personal Factors and Comorbidities Comorbidity 2    Comorbidities Chronic pain, MI with CABG    Examination-Activity Limitations Lift;Carry    Examination-Participation Restrictions Community Activity;Meal Prep    Stability/Clinical Decision Making Stable/Uncomplicated    Clinical  Decision Making Moderate    Rehab Potential Good    PT Frequency 2x / week    PT Duration 6 weeks    PT Treatment/Interventions Therapeutic activities;Therapeutic exercise;Cryotherapy;Moist Heat;Electrical Stimulation;Iontophoresis 4mg /ml Dexamethasone;Neuromuscular re-education;Manual techniques;Dry needling;Patient/family education;Joint Manipulations;Spinal Manipulations    PT Next Visit Plan improve AROM and strength    PT Home Exercise Plan See education section           Patient will benefit from skilled therapeutic intervention in order to improve the following deficits and impairments:   Increased muscle spasms, Increased fascial restricitons, Pain, Decreased endurance, Decreased range of motion, Decreased strength, Hypomobility  Visit Diagnosis: Stiffness of left shoulder, not elsewhere classified  Chronic left shoulder pain     Problem List Patient Active Problem List   Diagnosis Date Noted  . Cold intolerance 08/19/2020  . Dysuria 07/21/2020  . Encounter for postoperative wound check 07/09/2019  . Encounter for post surgical wound check 07/02/2019  . Tender scar 07/02/2019  . Coronary artery disease involving native coronary artery of native heart without angina pectoris 07/02/2019  . Ischemic cardiomyopathy 07/02/2019  . Keloid scar of skin 07/02/2019  . Hyperlipidemia LDL goal <70 07/02/2019  . Hx of CABG 08/26/2018  . Coronary artery disease 08/26/2018  . Coronary artery disease involving native heart with angina pectoris (HCC)   . Chest pain 08/21/2018  . History of non-ST elevation myocardial infarction (NSTEMI) 08/21/2018  . Hyperlipidemia associated with type 2 diabetes mellitus (HCC) 07/20/2016  . Essential hypertension 07/20/2016  . Asthma 07/05/2016  . Tachycardia 07/05/2016  . Routine general medical examination at a health care facility 06/16/2013  . Screen for colon cancer 06/16/2013  . Screening for breast cancer 06/16/2013  . Type 2 diabetes mellitus with vascular disease (HCC) 06/16/2013    Myrene GalasWesley Daffney Greenly, PT DPT 08/30/2020, 10:00 AM  [facility] [address] [place], [place], [address] Phone: [phone]   Fax:  [phone]  Name: Olivia Roth MRN: [id number] Date of Birth: Jul 01, 1956

## 2020-09-01 ENCOUNTER — Ambulatory Visit: Payer: BC Managed Care – PPO

## 2020-09-06 ENCOUNTER — Ambulatory Visit: Payer: BC Managed Care – PPO

## 2020-09-06 ENCOUNTER — Other Ambulatory Visit: Payer: Self-pay

## 2020-09-06 DIAGNOSIS — M25512 Pain in left shoulder: Secondary | ICD-10-CM | POA: Diagnosis not present

## 2020-09-06 DIAGNOSIS — M25612 Stiffness of left shoulder, not elsewhere classified: Secondary | ICD-10-CM | POA: Diagnosis not present

## 2020-09-06 DIAGNOSIS — G8929 Other chronic pain: Secondary | ICD-10-CM

## 2020-09-06 NOTE — Therapy (Signed)
[facility] [place] [address] [place], [address] Phone: [phone]   Fax:  [phone]  Physical Therapy Treatment  Patient Details  Name: Olivia Roth MRN: [id number] Date of Birth: Jul 12, 1956 Referring Provider (PT): Althea Charon MD   Encounter Date: 09/06/2020   PT End of Session - 09/06/20 1017    Visit Number 2    Number of Visits 13    Date for PT Re-Evaluation 10/11/20    Authorization Type 1/10    PT Start Time 0945    PT Stop Time 1030    PT Time Calculation (min) 45 min    Activity Tolerance Patient tolerated treatment well    Behavior During Therapy [facility] for tasks assessed/performed           Past Medical History:  Diagnosis Date  . Anxiety   . Asthma    In cold weather (08/21/2018)  . CAD (coronary artery disease)    a. 08/2018 NSTEMI/Cath: LM 50d,LAD 80ost/p, 48m 70d, LCX 90ost, OM1 50, OM3 70, OM4 80, LPDA small, RCA  99p/d, EF 30-35%; c. 08/2018 CABG x 4: LIMA->LAD, VG->RI, VG->OM, VG->RCA.  Marland Kitchen Chronic combined systolic (congestive) and diastolic (congestive) heart failure (HCC)    a. 08/2018 Echo: EF 40-45%, mid-apicalanteroseptal and apical HK. Gr2 DD. Mild AI; b. 08/2018 intraop TEE: EF 45-50%, antsept, infsept HK. Mild AI. Mildly dil Ao root. Trace MR.  . Family history of adverse reaction to anesthesia    "mother PONV; talked crazy/loud after anesthesia" (08/21/2018)  . GERD (gastroesophageal reflux disease)   . High cholesterol   . Ischemic cardiomyopathy    a. 08/2018 Echo: EF 40-45%; b. 08/2018 TEE: EF 45-50%.  . Seasonal allergies   . Tuberculosis ~ 1960  . Type II diabetes mellitus (HCC)   . UTI (lower urinary tract infection)    "once; before hysterectomy" (08/21/2018)    Past Surgical History:  Procedure Laterality Date  . CARDIAC CATHETERIZATION    . CORONARY ARTERY BYPASS GRAFT N/A 08/26/2018   Procedure: CORONARY ARTERY BYPASS GRAFTING (CABG) times four using left  internal mammary artery and right leg saphenous vein grafts;  Surgeon: Kerin Perna, MD;  Location: [facility] OR;  Service: Open Heart Surgery;  Laterality: N/A;  . GANGLION CYST EXCISION Left ~ 1960   neck  . LEFT HEART CATH AND CORONARY ANGIOGRAPHY N/A 08/21/2018   Procedure: LEFT HEART CATH AND CORONARY ANGIOGRAPHY;  Surgeon: Yvonne Kendall, MD;  Location: [facility] INVASIVE CV LAB;  Service: Cardiovascular;  Laterality: N/A;  . TEE WITHOUT CARDIOVERSION N/A 08/26/2018   Procedure: TRANSESOPHAGEAL ECHOCARDIOGRAM (TEE);  Surgeon: Donata Clay, Theron Arista, MD;  Location: [facility] OR;  Service: Open Heart Surgery;  Laterality: N/A;  . TOTAL ABDOMINAL HYSTERECTOMY  2006    There were no vitals filed for this visit.   Subjective Assessment - 09/06/20 0953    Subjective Patient states she has been able to move her arm through greater AROM with less overall pain.    Pertinent History CABG, MI in 2019    Limitations Lifting    Diagnostic tests none    Patient Stated Goals improve use of UE    Currently in Pain? No/denies    Pain Onset More than a month ago             TREATMENT Therapeutic Exercise Shoulder rows at OMEGA 10# -- 2 x 10 Shoulder flexion with PVC pipe in standing - 2 x 15 Sitting pulleys - 5  min, 2 min Seated lat isometrics - x10 5 sec UE Ranger flexion - x 15, x 10; Abduction -  x 7, x 10  Shoulder ER in standing with YTB - 2 x 20 Lat pull downs at [facility] - 3 x 6 20# Performed exercises to address shoulder AROM and strength    PT Education - 09/06/20 1013    Education Details form/technique with exercise : HEP: Pulleys in sitting    Person(s) Educated Patient    Methods Explanation;Demonstration    Comprehension Verbalized understanding;Returned demonstration               PT Long Term Goals - 08/30/20 0945      PT LONG TERM GOAL #1   Title Patient will improve Foto score from 55-70 to indicate significant improvement in shoulder function.    Baseline 55    Time 6     Period Weeks    Status New    Target Date 10/11/20      PT LONG TERM GOAL #2   Title Patient will improve her worst pain score from a 5 out of 10 to a 2 out of 10 to indicate significant improvement in shoulder pain based on the visual analog scale.    Baseline 5/10    Time 6    Period Weeks    Status New    Target Date 10/11/20      PT LONG TERM GOAL #3   Title Patient will be independent with exercise performance to further benefit from therapy after discharge.    Baseline depemdemt for form and technique    Time 6    Period Weeks    Status New    Target Date 10/11/20      PT LONG TERM GOAL #4   Title Patient will have full shoulder flexion active range of motion on the left shoulder compared to right for greater functional use of left upper extremity.    Baseline 78 degrees    Time 6    Period Weeks    Status New                 Plan - 09/06/20 1025    Clinical Impression Statement Patient is able to demonstrate 90 degrees of active range of motion along the affected shoulder.  Patient is making improvements overall as noted by improve range of motion and improve strength and greater ability to go through a pain-free range of motion.  Patient's active assisted range of motion increases to around 130 compared to 110 the previous session.  Patient continues to demonstrate improvements however has difficulty performing any active assisted or active shoulder abduction range of motion.  Patient will benefit from further skilled therapy to return to prior level of function.    Personal Factors and Comorbidities Comorbidity 2    Comorbidities Chronic pain, MI with CABG    Examination-Activity Limitations Lift;Carry    Examination-Participation Restrictions Community Activity;Meal Prep    Stability/Clinical Decision Making Stable/Uncomplicated    Rehab Potential Good    PT Frequency 2x / week    PT Duration 6 weeks    PT Treatment/Interventions Therapeutic  activities;Therapeutic exercise;Cryotherapy;Moist Heat;Electrical Stimulation;Iontophoresis 4mg /ml Dexamethasone;Neuromuscular re-education;Manual techniques;Dry needling;Patient/family education;Joint Manipulations;Spinal Manipulations    PT Next Visit Plan improve AROM and strength    PT Home Exercise Plan See education section           Patient will benefit from skilled therapeutic intervention in order to improve the following deficits  and impairments:  Increased muscle spasms, Increased fascial restricitons, Pain, Decreased endurance, Decreased range of motion, Decreased strength, Hypomobility  Visit Diagnosis: Stiffness of left shoulder, not elsewhere classified  Chronic left shoulder pain     Problem List Patient Active Problem List   Diagnosis Date Noted  . Cold intolerance 08/19/2020  . Dysuria 07/21/2020  . Encounter for postoperative wound check 07/09/2019  . Encounter for post surgical wound check 07/02/2019  . Tender scar 07/02/2019  . Coronary artery disease involving native coronary artery of native heart without angina pectoris 07/02/2019  . Ischemic cardiomyopathy 07/02/2019  . Keloid scar of skin 07/02/2019  . Hyperlipidemia LDL goal <70 07/02/2019  . Hx of CABG 08/26/2018  . Coronary artery disease 08/26/2018  . Coronary artery disease involving native heart with angina pectoris (HCC)   . Chest pain 08/21/2018  . History of non-ST elevation myocardial infarction (NSTEMI) 08/21/2018  . Hyperlipidemia associated with type 2 diabetes mellitus (HCC) 07/20/2016  . Essential hypertension 07/20/2016  . Asthma 07/05/2016  . Tachycardia 07/05/2016  . Routine general medical examination at a health care facility 06/16/2013  . Screen for colon cancer 06/16/2013  . Screening for breast cancer 06/16/2013  . Type 2 diabetes mellitus with vascular disease (HCC) 06/16/2013    Myrene Galas, PT DPT 09/06/2020, 10:32 AM  [facility]  PHYSICAL AND SPORTS MEDICINE [address] [place], [address] Phone: [phone]   Fax:  [phone]  Name: Olivia Roth MRN: [id number] Date of Birth: 11/05/1955

## 2020-09-16 ENCOUNTER — Other Ambulatory Visit: Payer: Self-pay

## 2020-09-16 ENCOUNTER — Ambulatory Visit: Payer: BC Managed Care – PPO | Attending: Family Medicine

## 2020-09-16 DIAGNOSIS — M25512 Pain in left shoulder: Secondary | ICD-10-CM | POA: Diagnosis not present

## 2020-09-16 DIAGNOSIS — M25612 Stiffness of left shoulder, not elsewhere classified: Secondary | ICD-10-CM | POA: Insufficient documentation

## 2020-09-16 DIAGNOSIS — G8929 Other chronic pain: Secondary | ICD-10-CM | POA: Diagnosis not present

## 2020-09-16 NOTE — Therapy (Signed)
[facility]INE [address] [place], [address] Phone: [phone]   Fax:  [phone]  Physical Therapy Treatment  Patient Details  Name: Olivia Roth MRN: [id number] Date of Birth: [birth year] Referring Provider (PT): Althea Charon MD   Encounter Date: 09/16/2020   PT End of Session - 09/16/20 1510    Visit Number 3    Number of Visits 13    Date for PT Re-Evaluation 10/11/20    Authorization Type 1/10    PT Start Time 1500    PT Stop Time 1545    PT Time Calculation (min) 45 min    Activity Tolerance Patient tolerated treatment well    Behavior During Therapy [facility] for tasks assessed/performed           Past Medical History:  Diagnosis Date  . Anxiety   . Asthma    In cold weather (08/21/2018)  . CAD (coronary artery disease)    a. 08/2018 NSTEMI/Cath: LM 50d,LAD 80ost/p, 53m 70d, LCX 90ost, OM1 50, OM3 70, OM4 80, LPDA small, RCA  99p/d, EF 30-35%; c. 08/2018 CABG x 4: LIMA->LAD, VG->RI, VG->OM, VG->RCA.  Marland Kitchen Chronic combined systolic (congestive) and diastolic (congestive) heart failure (HCC)    a. 08/2018 Echo: EF 40-45%, mid-apicalanteroseptal and apical HK. Gr2 DD. Mild AI; b. 08/2018 intraop TEE: EF 45-50%, antsept, infsept HK. Mild AI. Mildly dil Ao root. Trace MR.  . Family history of adverse reaction to anesthesia    "mother PONV; talked crazy/loud after anesthesia" (08/21/2018)  . GERD (gastroesophageal reflux disease)   . High cholesterol   . Ischemic cardiomyopathy    a. 08/2018 Echo: EF 40-45%; b. 08/2018 TEE: EF 45-50%.  . Seasonal allergies   . Tuberculosis ~ 1960  . Type II diabetes mellitus (HCC)   . UTI (lower urinary tract infection)    "once; before hysterectomy" (08/21/2018)    Past Surgical History:  Procedure Laterality Date  . CARDIAC CATHETERIZATION    . CORONARY ARTERY BYPASS GRAFT N/A 08/26/2018   Procedure: CORONARY ARTERY BYPASS GRAFTING (CABG) times four using left  internal mammary artery and right leg saphenous vein grafts;  Surgeon: Kerin Perna, MD;  Location: [facility] OR;  Service: Open Heart Surgery;  Laterality: N/A;  . GANGLION CYST EXCISION Left ~ 1960   neck  . LEFT HEART CATH AND CORONARY ANGIOGRAPHY N/A 08/21/2018   Procedure: LEFT HEART CATH AND CORONARY ANGIOGRAPHY;  Surgeon: Yvonne Kendall, MD;  Location: [facility] INVASIVE CV LAB;  Service: Cardiovascular;  Laterality: N/A;  . TEE WITHOUT CARDIOVERSION N/A 08/26/2018   Procedure: TRANSESOPHAGEAL ECHOCARDIOGRAM (TEE);  Surgeon: Donata Clay, Theron Arista, MD;  Location: [facility] OR;  Service: Open Heart Surgery;  Laterality: N/A;  . TOTAL ABDOMINAL HYSTERECTOMY  2006    There were no vitals filed for this visit.   Subjective Assessment - 09/16/20 1507    Subjective Patient reports her shoulder has been improving, however continues to have increasde difficulty with raising her arm overhead.    Pertinent History CABG, MI in 2019    Limitations Lifting    Diagnostic tests none    Patient Stated Goals improve use of UE    Currently in Pain? No/denies    Pain Onset More than a month ago             TREATMENT Therapeutic Exercise Shoulder presses in sitting --  3 x 10  Weight behind the back passes - 3 x 15 Shoulder rows with YTB -  3 x 20  Shoulder B ER with YTB - 3 x 20  Lat pull downs at OMEGA -  2 x 6 20#, 2 x 15 15# Sitting pulleys -  2 min Push up PLUS with band and at wall CKC-- 2 x 10 each Shoulder flexion B at ball -- 2 x 10  Seated distraction with use of the hand underneath table -- x20 3 sec holds   Performed exercises to address shoulder AROM and strength      PT Education - 09/16/20 1509    Education Details form/technique with exericse; shoulder press    Person(s) Educated Patient    Methods Explanation;Demonstration    Comprehension Verbalized understanding;Returned demonstration               PT Long Term Goals - 08/30/20 0945      PT LONG TERM GOAL #1   Title  Patient will improve Foto score from 55-70 to indicate significant improvement in shoulder function.    Baseline 55    Time 6    Period Weeks    Status New    Target Date 10/11/20      PT LONG TERM GOAL #2   Title Patient will improve her worst pain score from a 5 out of 10 to a 2 out of 10 to indicate significant improvement in shoulder pain based on the visual analog scale.    Baseline 5/10    Time 6    Period Weeks    Status New    Target Date 10/11/20      PT LONG TERM GOAL #3   Title Patient will be independent with exercise performance to further benefit from therapy after discharge.    Baseline depemdemt for form and technique    Time 6    Period Weeks    Status New    Target Date 10/11/20      PT LONG TERM GOAL #4   Title Patient will have full shoulder flexion active range of motion on the left shoulder compared to right for greater functional use of left upper extremity.    Baseline 78 degrees    Time 6    Period Weeks    Status New                 Plan - 09/16/20 1510    Clinical Impression Statement Patient's shoulder flexion continues to slowly improve shoulder AROM with ability to achieve 100 degrees of AROM. Continued to address strengthening along the affected shoulder, most notably along the shoulder ERs and flexors. Patient is making progress overall and will benefit from further skilled therapy to return to prior level of function.    Personal Factors and Comorbidities Comorbidity 2    Comorbidities Chronic pain, MI with CABG    Examination-Activity Limitations Lift;Carry    Examination-Participation Restrictions Community Activity;Meal Prep    Stability/Clinical Decision Making Stable/Uncomplicated    Rehab Potential Good    PT Frequency 2x / week    PT Duration 6 weeks    PT Treatment/Interventions Therapeutic activities;Therapeutic exercise;Cryotherapy;Moist Heat;Electrical Stimulation;Iontophoresis 4mg /ml Dexamethasone;Neuromuscular  re-education;Manual techniques;Dry needling;Patient/family education;Joint Manipulations;Spinal Manipulations    PT Next Visit Plan improve AROM and strength    PT Home Exercise Plan See education section           Patient will benefit from skilled therapeutic intervention in order to improve the following deficits and impairments:  Increased muscle spasms,Increased fascial restricitons,Pain,Decreased endurance,Decreased range of motion,Decreased strength,Hypomobility  Visit Diagnosis:  Stiffness of left shoulder, not elsewhere classified  Chronic left shoulder pain     Problem List Patient Active Problem List   Diagnosis Date Noted  . Cold intolerance 08/19/2020  . Dysuria 07/21/2020  . Encounter for postoperative wound check 07/09/2019  . Encounter for post surgical wound check 07/02/2019  . Tender scar 07/02/2019  . Coronary artery disease involving native coronary artery of native heart without angina pectoris 07/02/2019  . Ischemic cardiomyopathy 07/02/2019  . Keloid scar of skin 07/02/2019  . Hyperlipidemia LDL goal <70 07/02/2019  . Hx of CABG 08/26/2018  . Coronary artery disease 08/26/2018  . Coronary artery disease involving native heart with angina pectoris (HCC)   . Chest pain 08/21/2018  . History of non-ST elevation myocardial infarction (NSTEMI) 08/21/2018  . Hyperlipidemia associated with type 2 diabetes mellitus (HCC) 07/20/2016  . Essential hypertension 07/20/2016  . Asthma 07/05/2016  . Tachycardia 07/05/2016  . Routine general medical examination at a health care facility 06/16/2013  . Screen for colon cancer 06/16/2013  . Screening for breast cancer 06/16/2013  . Type 2 diabetes mellitus with vascular disease (HCC) 06/16/2013    Myrene Galas, PT DPT 09/16/2020, 3:17 PM  [facility] PHYSICAL AND SPORTS MEDICINE [address], [place], [address] Phone: [phone]   Fax:  [phone]  Name: Olivia Roth MRN: [id number] Date of Birth: 1956-05-07

## 2020-09-23 ENCOUNTER — Ambulatory Visit: Payer: BC Managed Care – PPO

## 2020-10-11 ENCOUNTER — Ambulatory Visit: Payer: BC Managed Care – PPO

## 2020-10-12 ENCOUNTER — Ambulatory Visit: Payer: BC Managed Care – PPO | Attending: Family Medicine

## 2020-10-12 DIAGNOSIS — G8929 Other chronic pain: Secondary | ICD-10-CM | POA: Insufficient documentation

## 2020-10-12 DIAGNOSIS — M25512 Pain in left shoulder: Secondary | ICD-10-CM | POA: Insufficient documentation

## 2020-10-12 DIAGNOSIS — M25612 Stiffness of left shoulder, not elsewhere classified: Secondary | ICD-10-CM | POA: Insufficient documentation

## 2020-10-13 ENCOUNTER — Ambulatory Visit: Payer: BC Managed Care – PPO

## 2020-10-14 ENCOUNTER — Ambulatory Visit: Payer: BC Managed Care – PPO

## 2020-10-18 ENCOUNTER — Other Ambulatory Visit: Payer: Self-pay

## 2020-10-18 ENCOUNTER — Ambulatory Visit: Payer: BC Managed Care – PPO

## 2020-10-18 DIAGNOSIS — M25612 Stiffness of left shoulder, not elsewhere classified: Secondary | ICD-10-CM | POA: Diagnosis not present

## 2020-10-18 DIAGNOSIS — G8929 Other chronic pain: Secondary | ICD-10-CM | POA: Diagnosis not present

## 2020-10-18 DIAGNOSIS — M25512 Pain in left shoulder: Secondary | ICD-10-CM | POA: Diagnosis not present

## 2020-10-18 NOTE — Therapy (Signed)
[facility]IC[facility] [address], [place], [address] Phone: [phone]   Fax:  [phone]  Physical Therapy Treatment  Patient Details  Name: Olivia Roth MRN: [id number] Date of Birth: 10-01-1956 Referring Provider (PT): Althea Charon MD   Encounter Date: 10/18/2020   PT End of Session - 10/18/20 1446    Visit Number 4    Number of Visits 13    Date for PT Re-Evaluation 10/11/20    Authorization Type 1/10    PT Start Time 1435    PT Stop Time 1520    PT Time Calculation (min) 45 min    Activity Tolerance Patient tolerated treatment well    Behavior During Therapy [facility] for tasks assessed/performed           Past Medical History:  Diagnosis Date  . Anxiety   . Asthma    In cold weather (08/21/2018)  . CAD (coronary artery disease)    a. 08/2018 NSTEMI/Cath: LM 50d,LAD 80ost/p, 60m 70d, LCX 90ost, OM1 50, OM3 70, OM4 80, LPDA small, RCA  99p/d, EF 30-35%; c. 08/2018 CABG x 4: LIMA->LAD, VG->RI, VG->OM, VG->RCA.  Marland Kitchen Chronic combined systolic (congestive) and diastolic (congestive) heart failure (HCC)    a. 08/2018 Echo: EF 40-45%, mid-apicalanteroseptal and apical HK. Gr2 DD. Mild AI; b. 08/2018 intraop TEE: EF 45-50%, antsept, infsept HK. Mild AI. Mildly dil Ao root. Trace MR.  . Family history of adverse reaction to anesthesia    "mother PONV; talked crazy/loud after anesthesia" (08/21/2018)  . GERD (gastroesophageal reflux disease)   . High cholesterol   . Ischemic cardiomyopathy    a. 08/2018 Echo: EF 40-45%; b. 08/2018 TEE: EF 45-50%.  . Seasonal allergies   . Tuberculosis ~ 1960  . Type II diabetes mellitus (HCC)   . UTI (lower urinary tract infection)    "once; before hysterectomy" (08/21/2018)    Past Surgical History:  Procedure Laterality Date  . CARDIAC CATHETERIZATION    . CORONARY ARTERY BYPASS GRAFT N/A 08/26/2018   Procedure: CORONARY ARTERY BYPASS GRAFTING (CABG) times four using left  internal mammary artery and right leg saphenous vein grafts;  Surgeon: Kerin Perna, MD;  Location: [facility] OR;  Service: Open Heart Surgery;  Laterality: N/A;  . GANGLION CYST EXCISION Left ~ 1960   neck  . LEFT HEART CATH AND CORONARY ANGIOGRAPHY N/A 08/21/2018   Procedure: LEFT HEART CATH AND CORONARY ANGIOGRAPHY;  Surgeon: Yvonne Kendall, MD;  Location: [facility] INVASIVE CV LAB;  Service: Cardiovascular;  Laterality: N/A;  . TEE WITHOUT CARDIOVERSION N/A 08/26/2018   Procedure: TRANSESOPHAGEAL ECHOCARDIOGRAM (TEE);  Surgeon: Donata Clay, Theron Arista, MD;  Location: [facility] OR;  Service: Open Heart Surgery;  Laterality: N/A;  . TOTAL ABDOMINAL HYSTERECTOMY  2006    There were no vitals filed for this visit.   Subjective Assessment - 10/18/20 1435    Subjective Patient states she is feeling tight in her shoulder.  She has been working on the pulleys at home to stretch it out.    Pertinent History CABG, MI in 2019    Limitations Lifting    Diagnostic tests none    Patient Stated Goals improve use of UE    Pain Onset More than a month ago            Interventions Today:   Therapeutic Exercise Sitting pulleys -  2 min Shoulder presses in standing (3#)  --  3 x 10  Weight behind the back passes (  3#)- 3 x 15 Shoulder rows with OMEGA - (10#)3 X 10 Shoulder B ER with YTB - 3 x 20  Lat pull downs at OMEGA (15#)-  3 x 8 Shoulder flexion B at ball -- 2 x 10     Manual Therapy: PROM shoulder (pt supine)- capsular end feel all directions  GH jt inferior glide and A/P glide: hypomobile, pt reports feels better, 3x30 sec Gr 3        PT Education - 10/18/20 1445    Education Details form, technique for exercises    Person(s) Educated Patient    Methods Explanation;Demonstration    Comprehension Verbalized understanding;Returned demonstration               PT Long Term Goals - 08/30/20 0945      PT LONG TERM GOAL #1   Title Patient will improve Foto score from 55-70 to indicate  significant improvement in shoulder function.    Baseline 55    Time 6    Period Weeks    Status New    Target Date 10/11/20      PT LONG TERM GOAL #2   Title Patient will improve her worst pain score from a 5 out of 10 to a 2 out of 10 to indicate significant improvement in shoulder pain based on the visual analog scale.    Baseline 5/10    Time 6    Period Weeks    Status New    Target Date 10/11/20      PT LONG TERM GOAL #3   Title Patient will be independent with exercise performance to further benefit from therapy after discharge.    Baseline depemdemt for form and technique    Time 6    Period Weeks    Status New    Target Date 10/11/20      PT LONG TERM GOAL #4   Title Patient will have full shoulder flexion active range of motion on the left shoulder compared to right for greater functional use of left upper extremity.    Baseline 78 degrees    Time 6    Period Weeks    Status New                 Plan - 10/18/20 1530    Clinical Impression Statement Pt returns to PT after not attending appointments for a month.  Upon arrival she had 90 deg shoulder flexion AROM sitting.  She was restricted with all shoulder AROM (abd, IR, ER, Flex, and Ext).  Able to perform therapeutic exercises without c/o increased pain.  PT assessed GH mobility at end of session, pt responded well with GH jt mobs with improved shoulder flexion to 105 noted after superior/inferior glide and A/P glides.  Should cont to benefit from progressing strengthening and including mobs into tx. next session.    Personal Factors and Comorbidities Comorbidity 2    Comorbidities Chronic pain, MI with CABG    Examination-Activity Limitations Lift;Carry    Examination-Participation Restrictions Community Activity;Meal Prep    Stability/Clinical Decision Making Stable/Uncomplicated    Rehab Potential Good    PT Frequency 2x / week    PT Duration 6 weeks    PT Treatment/Interventions Therapeutic  activities;Therapeutic exercise;Cryotherapy;Moist Heat;Electrical Stimulation;Iontophoresis 4mg /ml Dexamethasone;Neuromuscular re-education;Manual techniques;Dry needling;Patient/family education;Joint Manipulations;Spinal Manipulations    PT Next Visit Plan improve AROM and strength    PT Home Exercise Plan added YTB rows (3x15) and ER (3x8) to HEP today  Patient will benefit from skilled therapeutic intervention in order to improve the following deficits and impairments:  Increased muscle spasms,Increased fascial restricitons,Pain,Decreased endurance,Decreased range of motion,Decreased strength,Hypomobility  Visit Diagnosis: Stiffness of left shoulder, not elsewhere classified  Chronic left shoulder pain     Problem List Patient Active Problem List   Diagnosis Date Noted  . Cold intolerance 08/19/2020  . Dysuria 07/21/2020  . Encounter for postoperative wound check 07/09/2019  . Encounter for post surgical wound check 07/02/2019  . Tender scar 07/02/2019  . Coronary artery disease involving native coronary artery of native heart without angina pectoris 07/02/2019  . Ischemic cardiomyopathy 07/02/2019  . Keloid scar of skin 07/02/2019  . Hyperlipidemia LDL goal <70 07/02/2019  . Hx of CABG 08/26/2018  . Coronary artery disease 08/26/2018  . Coronary artery disease involving native heart with angina pectoris (HCC)   . Chest pain 08/21/2018  . History of non-ST elevation myocardial infarction (NSTEMI) 08/21/2018  . Hyperlipidemia associated with type 2 diabetes mellitus (HCC) 07/20/2016  . Essential hypertension 07/20/2016  . Asthma 07/05/2016  . Tachycardia 07/05/2016  . Routine general medical examination at a health care facility 06/16/2013  . Screen for colon cancer 06/16/2013  . Screening for breast cancer 06/16/2013  . Type 2 diabetes mellitus with vascular disease (HCC) 06/16/2013    Ardine Bjork 10/18/2020, 3:41 PM Max Fickle, PT, DPT Physical  Therapist - [place]   [facility] PHYSICAL AND SPORTS MEDICINE [address] [place], [address] Phone: [phone]   Fax:  [phone]  Name: SAWSAN RIGGIO MRN: [id number] Date of Birth: July 11, 1956

## 2020-10-19 ENCOUNTER — Ambulatory Visit: Payer: BC Managed Care – PPO

## 2020-10-21 ENCOUNTER — Ambulatory Visit: Payer: BC Managed Care – PPO

## 2020-10-21 ENCOUNTER — Other Ambulatory Visit: Payer: Self-pay

## 2020-10-21 DIAGNOSIS — G8929 Other chronic pain: Secondary | ICD-10-CM

## 2020-10-21 DIAGNOSIS — M25512 Pain in left shoulder: Secondary | ICD-10-CM

## 2020-10-21 DIAGNOSIS — M25612 Stiffness of left shoulder, not elsewhere classified: Secondary | ICD-10-CM

## 2020-10-21 NOTE — Therapy (Addendum)
[facility]IC[facility] [address] [place], [address] Phone: [phone]   Fax:  [phone]  Physical Therapy Treatment/ Progress Note   Reporting Period: 08/30/2020 - 10/21/2020  Patient Details  Name: Olivia Roth MRN: [id number] Date of Birth: [birth year] Referring Provider (PT): Althea Charon MD   Encounter Date: 10/21/2020   PT End of Session - 10/21/20 0957    Visit Number 5    Number of Visits 13    Date for PT Re-Evaluation 10/11/20    Authorization Type 1/10    PT Start Time 0900    PT Stop Time 0945    PT Time Calculation (min) 45 min    Activity Tolerance Patient tolerated treatment well    Behavior During Therapy [facility] for tasks assessed/performed           Past Medical History:  Diagnosis Date  . Anxiety   . Asthma    In cold weather (08/21/2018)  . CAD (coronary artery disease)    a. 08/2018 NSTEMI/Cath: LM 50d,LAD 80ost/p, 62m 70d, LCX 90ost, OM1 50, OM3 70, OM4 80, LPDA small, RCA  99p/d, EF 30-35%; c. 08/2018 CABG x 4: LIMA->LAD, VG->RI, VG->OM, VG->RCA.  Marland Kitchen Chronic combined systolic (congestive) and diastolic (congestive) heart failure (HCC)    a. 08/2018 Echo: EF 40-45%, mid-apicalanteroseptal and apical HK. Gr2 DD. Mild AI; b. 08/2018 intraop TEE: EF 45-50%, antsept, infsept HK. Mild AI. Mildly dil Ao root. Trace MR.  . Family history of adverse reaction to anesthesia    "mother PONV; talked crazy/loud after anesthesia" (08/21/2018)  . GERD (gastroesophageal reflux disease)   . High cholesterol   . Ischemic cardiomyopathy    a. 08/2018 Echo: EF 40-45%; b. 08/2018 TEE: EF 45-50%.  . Seasonal allergies   . Tuberculosis ~ 1960  . Type II diabetes mellitus (HCC)   . UTI (lower urinary tract infection)    "once; before hysterectomy" (08/21/2018)    Past Surgical History:  Procedure Laterality Date  . CARDIAC CATHETERIZATION    . CORONARY ARTERY BYPASS GRAFT N/A 08/26/2018   Procedure:  CORONARY ARTERY BYPASS GRAFTING (CABG) times four using left internal mammary artery and right leg saphenous vein grafts;  Surgeon: Kerin Perna, MD;  Location: [facility] OR;  Service: Open Heart Surgery;  Laterality: N/A;  . GANGLION CYST EXCISION Left ~ 1960   neck  . LEFT HEART CATH AND CORONARY ANGIOGRAPHY N/A 08/21/2018   Procedure: LEFT HEART CATH AND CORONARY ANGIOGRAPHY;  Surgeon: Yvonne Kendall, MD;  Location: [facility] INVASIVE CV LAB;  Service: Cardiovascular;  Laterality: N/A;  . TEE WITHOUT CARDIOVERSION N/A 08/26/2018   Procedure: TRANSESOPHAGEAL ECHOCARDIOGRAM (TEE);  Surgeon: Donata Clay, Theron Arista, MD;  Location: [facility] OR;  Service: Open Heart Surgery;  Laterality: N/A;  . TOTAL ABDOMINAL HYSTERECTOMY  2006    There were no vitals filed for this visit.   Subjective Assessment - 10/21/20 0943    Subjective Patient states she is feeling tight in shoulder. Was unable to get coat on, but feeling better than last session.    Pertinent History CABG, MI in 2019    Limitations Lifting    Diagnostic tests none    Patient Stated Goals improve use of UE    Currently in Pain? No/denies    Pain Onset More than a month ago             Therapeutic Exercise:  AAROM Flexion w pole- 1x15 w 3 sec hold at top [facility]  Abduction with pole- 1x 15 w 3 sec hold Sitting pulleys -  2 min Weight behind the back passes (3#)- 3 x 15 Shoulder IR stretch with towel- 3x30sec Shoulder rows with OMEGA - (10#)3 X 10 Shoulder B ER with RTB - 3 x 20  Lat pull downs at OMEGA (15#)-  3 x 8     Manual Therapy: PROM shoulder (pt supine)- capsular end feel all directions  GH jt inferior glide and A/P glide: hypomobile, pt reports feels better, 3x30 sec Gr 3-4               PT Education - 10/21/20 0949    Education Details form, technique fo exercises    Person(s) Educated Patient    Methods Explanation;Demonstration    Comprehension Verbalized understanding;Returned demonstration                PT Long Term Goals - 10/21/20 1644      PT LONG TERM GOAL #1   Title Patient will improve Foto score from 55-70 to indicate significant improvement in shoulder function.    Baseline 55; 10/21/2020: Assess NV    Time 6    Period Weeks    Status On-going      PT LONG TERM GOAL #2   Title Patient will improve her worst pain score from a 5 out of 10 to a 2 out of 10 to indicate significant improvement in shoulder pain based on the visual analog scale.    Baseline 5/10; 10/21/2020: Will Assess NV    Time 6    Period Weeks    Status On-going      PT LONG TERM GOAL #3   Title Patient will be independent with exercise performance to further benefit from therapy after discharge.    Baseline depemdemt for form and technique; 10/21/2020: Patient requires moderate cueing for progression    Time 6    Period Weeks    Status On-going      PT LONG TERM GOAL #4   Title Patient will have full shoulder flexion active range of motion on the left shoulder compared to right for greater functional use of left upper extremity.    Baseline 78 degrees; 10/21/2020: 135 degrees    Time 6    Period Weeks    Status On-going                 Plan - 10/21/20 1002    Clinical Impression Statement Pt AROM was greater upon arrival from last session. She had 120 degrees active shoulder flexion, 90 degrees active shoulder abduction. After performed GH jt mobs  flexion improved to 135 and 100 with superior/inferior and A/P glides. Pt was able to perform therapeutic exercises without increased pain. Should cont to benefit from progessing strengthining and continued use of mobs into next tx. session.    Personal Factors and Comorbidities Comorbidity 2    Comorbidities Chronic pain, MI with CABG    Examination-Activity Limitations Lift;Carry    Examination-Participation Restrictions Community Activity;Meal Prep    Stability/Clinical Decision Making Stable/Uncomplicated    Rehab Potential Good    PT  Frequency 2x / week    PT Duration 6 weeks    PT Treatment/Interventions Therapeutic activities;Therapeutic exercise;Cryotherapy;Moist Heat;Electrical Stimulation;Iontophoresis 4mg /ml Dexamethasone;Neuromuscular re-education;Manual techniques;Dry needling;Patient/family education;Joint Manipulations;Spinal Manipulations    PT Next Visit Plan improve AROM and strength, work on end range ER    PT Home Exercise Plan added YTB rows (3x15) and ER (3x8) to HEP today  Patient will benefit from skilled therapeutic intervention in order to improve the following deficits and impairments:  Increased muscle spasms,Increased fascial restricitons,Pain,Decreased endurance,Decreased range of motion,Decreased strength,Hypomobility  Visit Diagnosis: Stiffness of left shoulder, not elsewhere classified  Chronic left shoulder pain     Problem List Patient Active Problem List   Diagnosis Date Noted  . Cold intolerance 08/19/2020  . Dysuria 07/21/2020  . Encounter for postoperative wound check 07/09/2019  . Encounter for post surgical wound check 07/02/2019  . Tender scar 07/02/2019  . Coronary artery disease involving native coronary artery of native heart without angina pectoris 07/02/2019  . Ischemic cardiomyopathy 07/02/2019  . Keloid scar of skin 07/02/2019  . Hyperlipidemia LDL goal <70 07/02/2019  . Hx of CABG 08/26/2018  . Coronary artery disease 08/26/2018  . Coronary artery disease involving native heart with angina pectoris (HCC)   . Chest pain 08/21/2018  . History of non-ST elevation myocardial infarction (NSTEMI) 08/21/2018  . Hyperlipidemia associated with type 2 diabetes mellitus (HCC) 07/20/2016  . Essential hypertension 07/20/2016  . Asthma 07/05/2016  . Tachycardia 07/05/2016  . Routine general medical examination at a health care facility 06/16/2013  . Screen for colon cancer 06/16/2013  . Screening for breast cancer 06/16/2013  . Type 2 diabetes mellitus with  vascular disease (HCC) 06/16/2013    Silvano Rusk, SPT 10/21/2020, 4:52 PM  [place] [facility] PHYSICAL AND SPORTS MEDICINE [address] [place], [address] Phone: [phone]   Fax:  [phone]  Name: Olivia Roth MRN: [id number] Date of Birth: 30-Sep-1956

## 2020-10-23 IMAGING — DX DG CHEST 1V PORT
1 series · 1 of 1 positions shown · non-contrast
Comparison: Chest radiograph performed 11/20/2007

CLINICAL DATA: Acute onset of sudden chest burning sensation.

EXAM:
PORTABLE CHEST 1 VIEW

[chest ap]
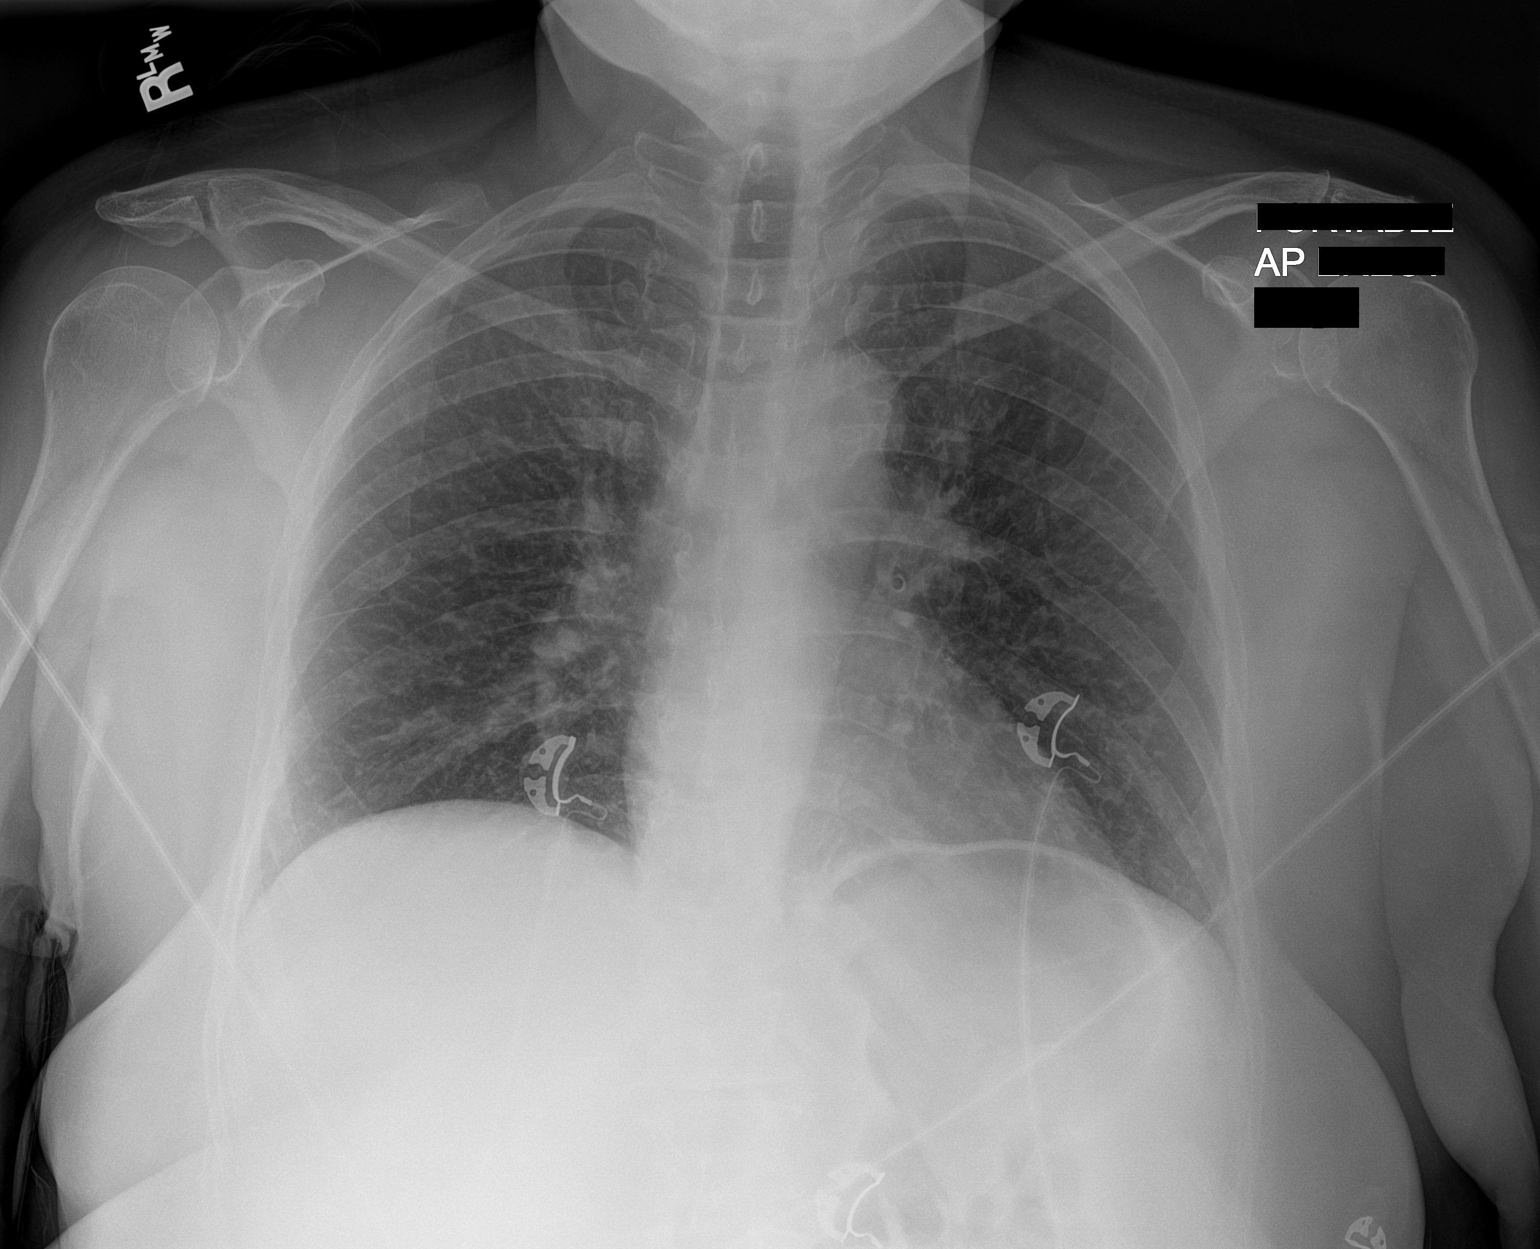

[1 of 1 positions shown; findings below may reference images not displayed]

FINDINGS: The lungs are well-aerated. Mild vascular congestion is noted.
Peribronchial thickening is seen. There is no evidence of focal
opacification, pleural effusion or pneumothorax.

The cardiomediastinal silhouette is within normal limits. No acute
osseous abnormalities are seen.
IMPRESSION: Mild vascular congestion noted. Peribronchial thickening seen.

## 2020-10-25 ENCOUNTER — Ambulatory Visit: Payer: BC Managed Care – PPO

## 2020-10-27 ENCOUNTER — Ambulatory Visit: Payer: BC Managed Care – PPO

## 2020-10-28 IMAGING — DX DG CHEST 1V PORT
1 series · 1 of 1 positions shown · non-contrast
Comparison: 08/23/2018

CLINICAL DATA: Status post CABG. Assess support lines and tubes.

EXAM:
PORTABLE CHEST 1 VIEW

[chest]
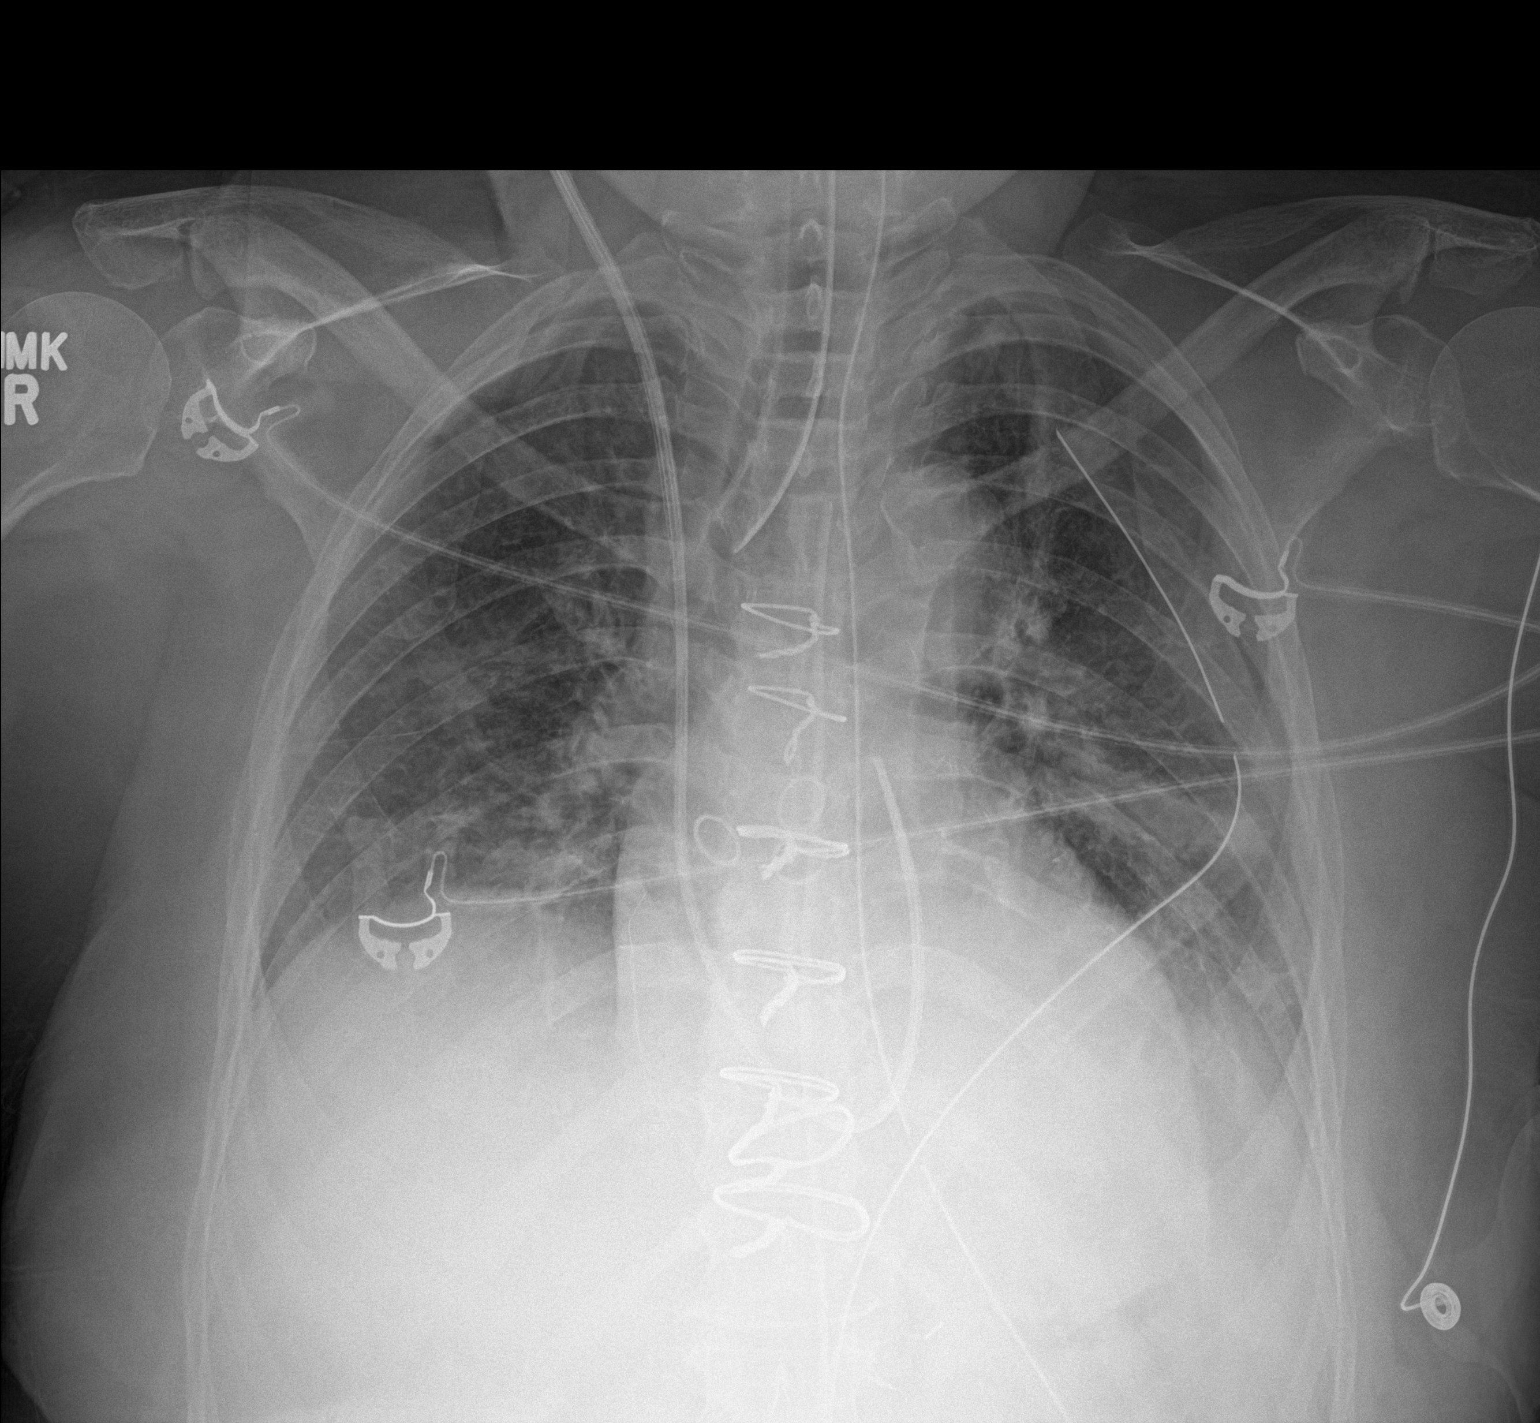

[1 of 1 positions shown; findings below may reference images not displayed]

FINDINGS: New median sternotomy sutures are in place. Heart size is top-normal
with mild uncoiling of nonaneurysmal, slightly atherosclerotic
aorta. Interstitial edema is noted with small left greater than
right pleural effusions.

A gastric tube is seen with side port at the GE juncture. Further
advancement is recommended.

Endotracheal tube tip is seen 17 mm above the carina at the level of
the aortic arch.

Swan-Ganz catheter projects over the mediastinum possibly within
left main pulmonary artery. There appear to be chest tubes in place
one along the periphery of the left lung with tip projecting over
the posterior left fourth rib and an additional 2 projecting over
mediastinum retracting over the T5 vertebral level.
IMPRESSION: 1. Support line and tube positions as above.
2. Interstitial edema with small left greater than right pleural
effusions.
3. No pneumothorax.

## 2020-10-29 IMAGING — DX DG CHEST 1V PORT
1 series · 1 of 1 positions shown · non-contrast
Comparison: 10/26/06

CLINICAL DATA: Short chest, CABG

EXAM:
PORTABLE CHEST 1 VIEW

[chest ap]
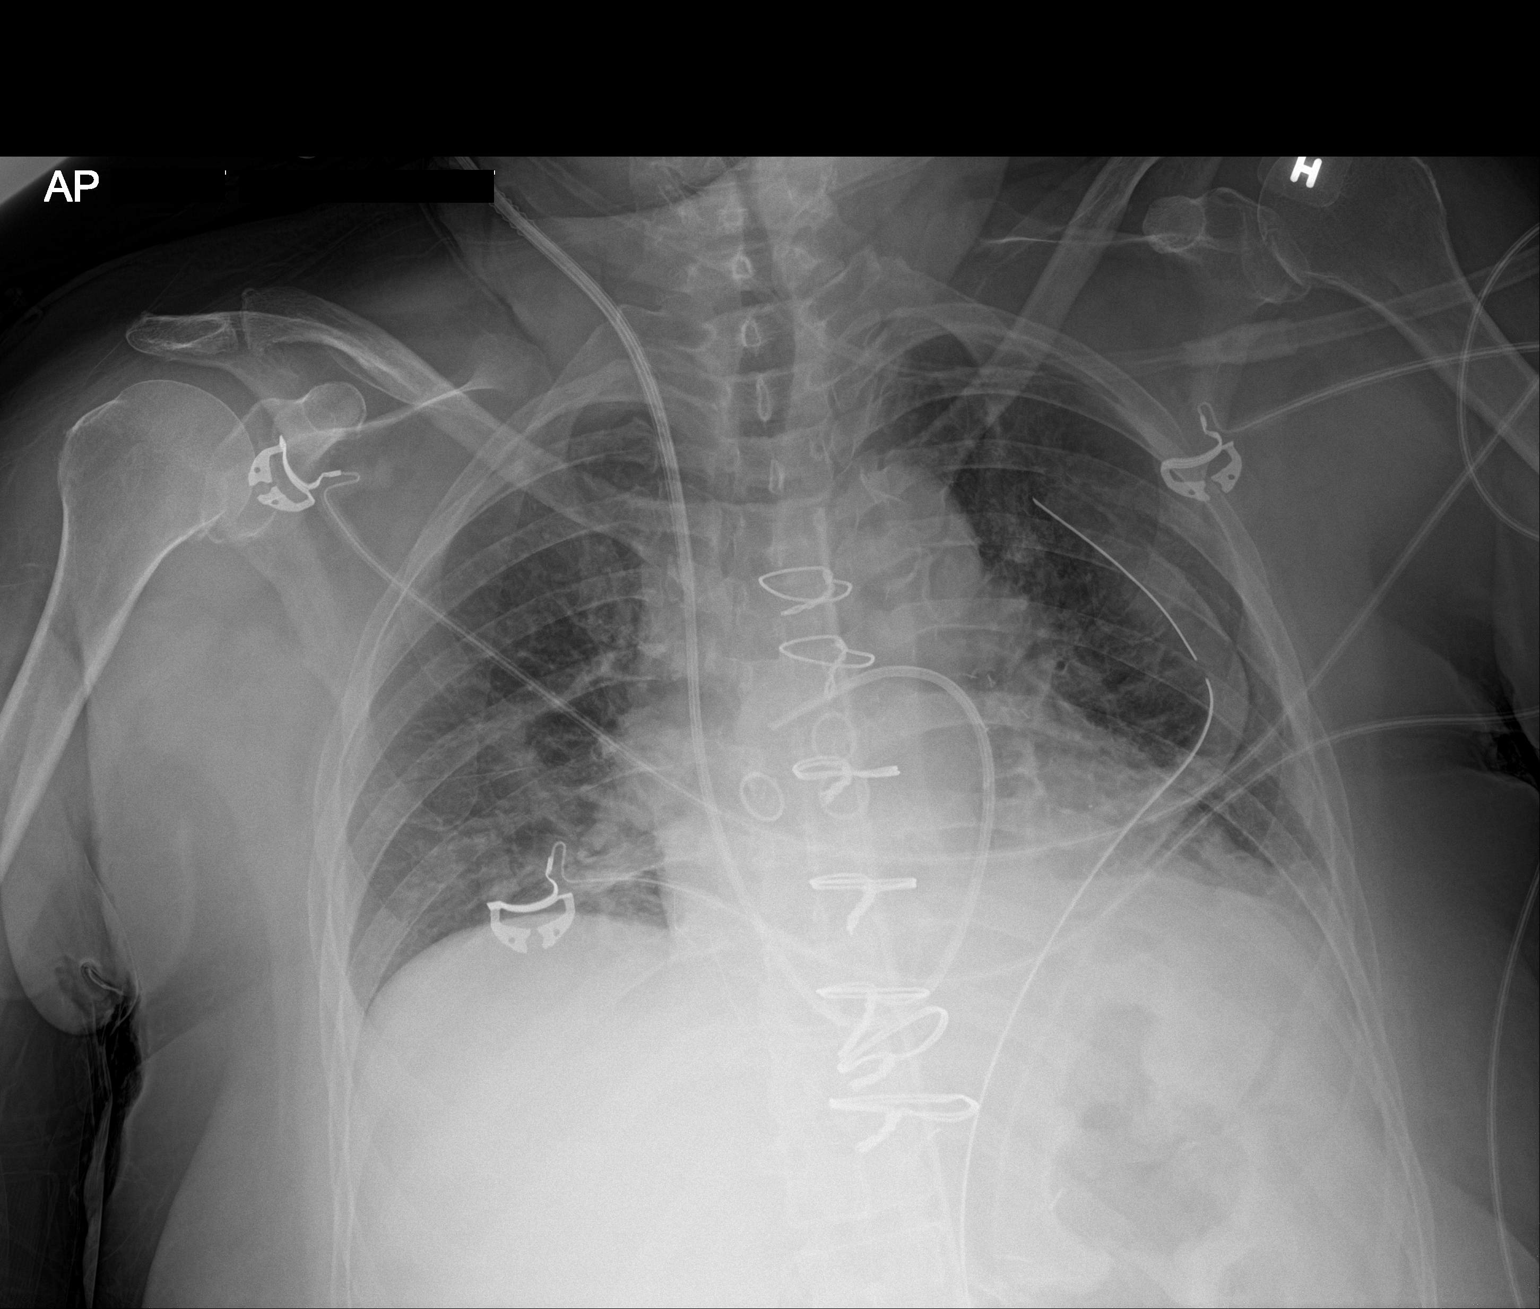

[1 of 1 positions shown; findings below may reference images not displayed]

FINDINGS: Interval extubation. Swan-Ganz catheter, LEFT chest tube, and
mediastinal drain remain. Low lung volumes. No pneumothorax. Mild
LEFT basilar atelectasis. Basilar atelectasis improved.
IMPRESSION: 1. Interval extubation.
2. Improvement in basilar atelectasis.
3. Stable additional support apparatus including LEFT chest tube
with no pneumothorax.

## 2020-10-30 IMAGING — DX DG CHEST 1V PORT
1 series · 1 of 1 positions shown · non-contrast
Comparison: Yesterday

CLINICAL DATA: Sore chest after CABG

EXAM:
PORTABLE CHEST 1 VIEW

[chest ap]
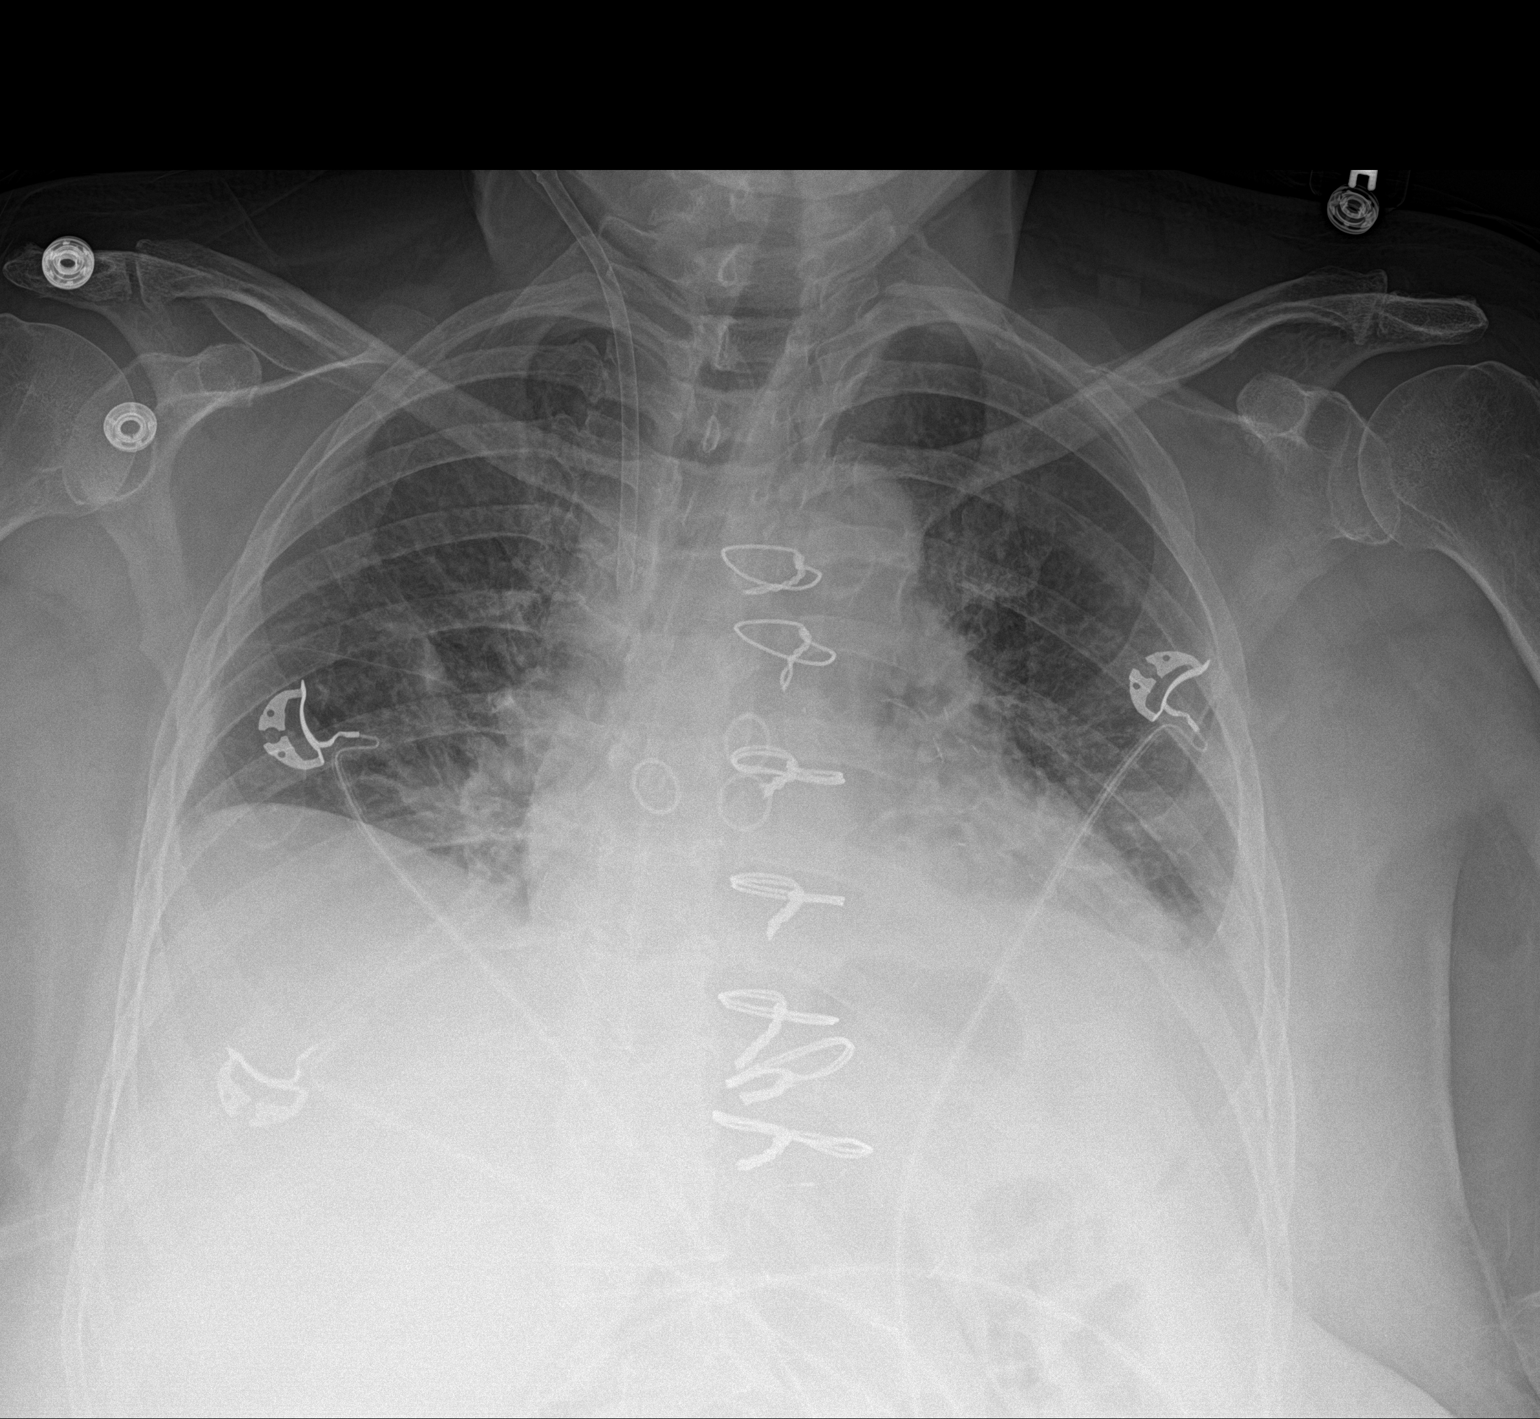

[1 of 1 positions shown; findings below may reference images not displayed]

FINDINGS: Small right apical pneumothorax is newly seen. Chest tubes and
Swan-Ganz catheter have been removed. Small left effusion. Low
volumes with atelectasis. Normal heart size. Status post CABG.
IMPRESSION: 1. Small right apical pneumothorax.
2. Low volume chest with atelectasis.

## 2020-10-31 IMAGING — DX DG CHEST 1V PORT
1 series · 1 of 1 positions shown · non-contrast
Comparison: 08/28/2018

CLINICAL DATA: Evaluate for pleural effusion.

EXAM:
PORTABLE CHEST 1 VIEW

[chest ap]
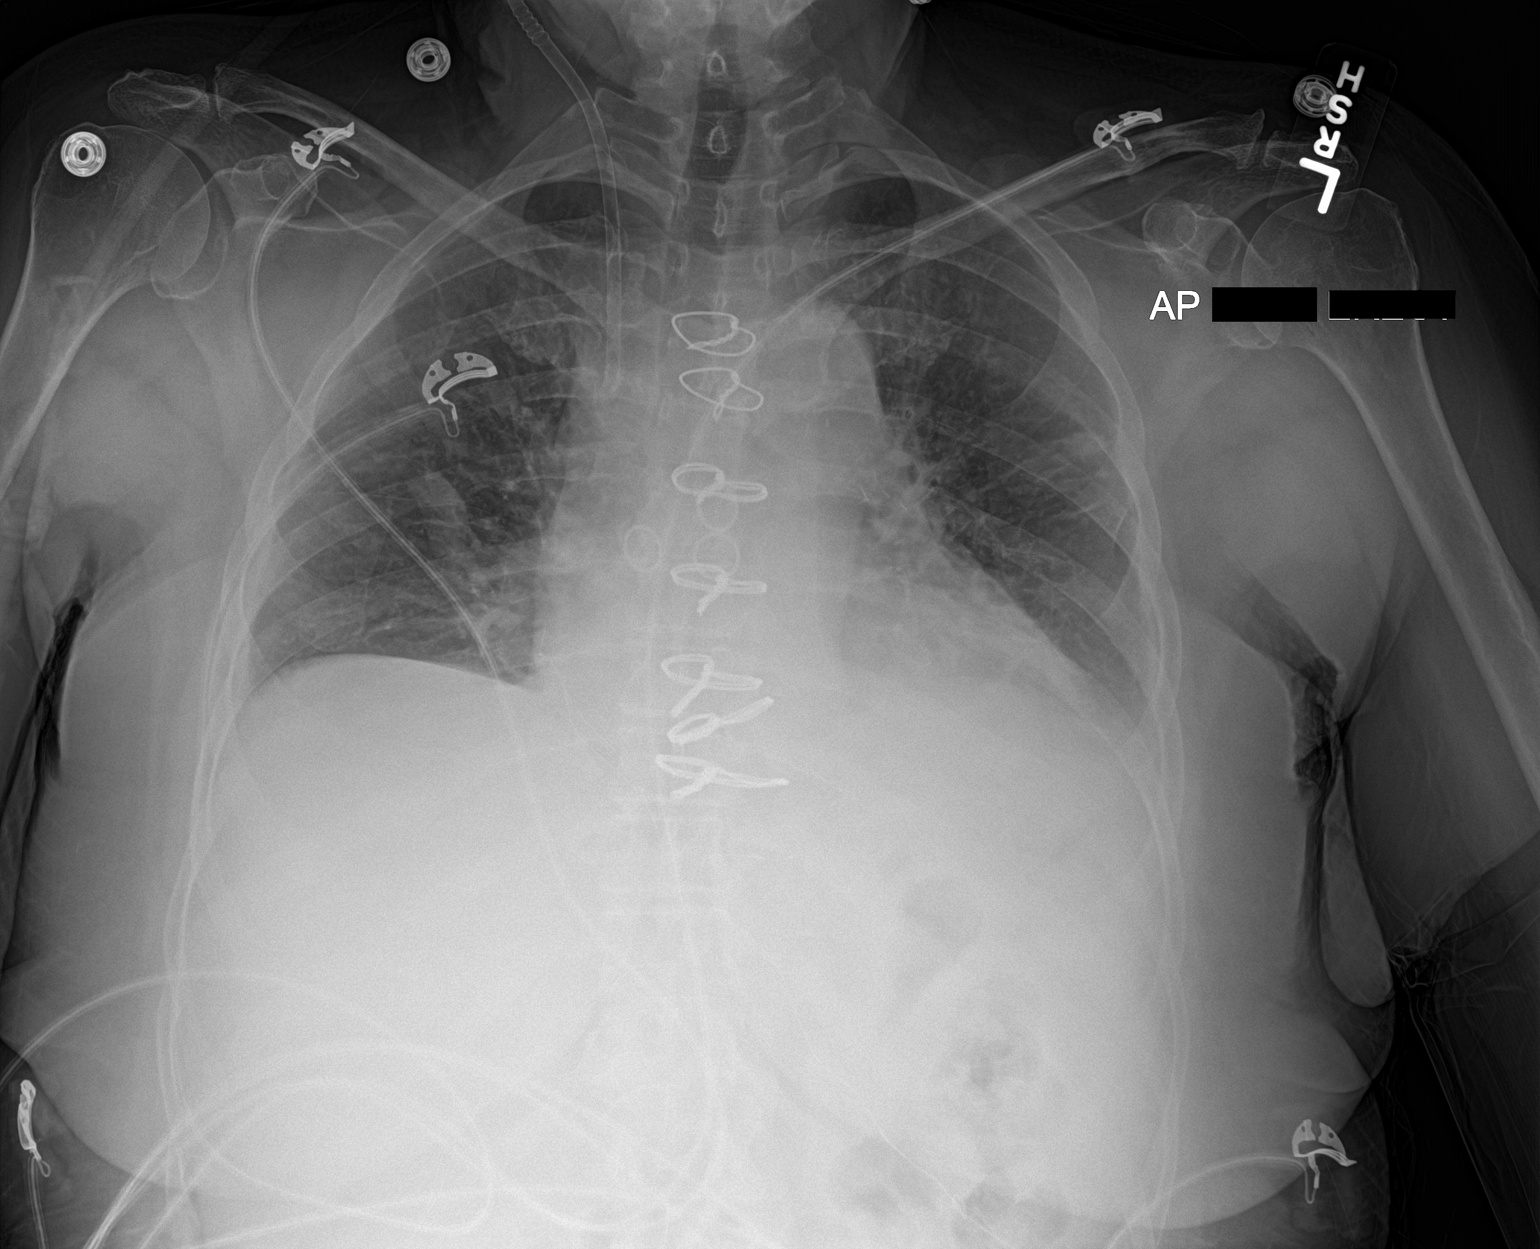

[1 of 1 positions shown; findings below may reference images not displayed]

FINDINGS: Previous exam report raised concern for a right apical pneumothorax.
There is no convincing evidence for a pneumothorax on this
examination. Again noted is a right jugular central line with the
tip in the upper SVC region. New streaky densities in the periphery
of the left lung could represent atelectasis. Persistent haziness at
the left lung base is suggestive for volume loss and pleural
effusion. Heart size is stable with post CABG changes.
IMPRESSION: 1. No evidence for pneumothorax.
2. New streaky densities in the left lung periphery are most
compatible with atelectasis.
3. Persistent densities at left lung base are compatible with volume
loss and possible small left pleural effusion.

## 2020-11-01 ENCOUNTER — Ambulatory Visit: Payer: BC Managed Care – PPO

## 2020-11-03 ENCOUNTER — Ambulatory Visit: Payer: BC Managed Care – PPO

## 2021-02-09 ENCOUNTER — Ambulatory Visit: Payer: BC Managed Care – PPO | Admitting: Internal Medicine

## 2021-02-16 ENCOUNTER — Ambulatory Visit: Payer: 59 | Admitting: Family Medicine

## 2021-03-14 ENCOUNTER — Ambulatory Visit: Payer: BC Managed Care – PPO | Admitting: Medical

## 2021-04-27 ENCOUNTER — Other Ambulatory Visit: Payer: Self-pay | Admitting: Internal Medicine

## 2021-04-27 NOTE — Telephone Encounter (Signed)
Patient is past due for her 6 month follow up.  LS 08/2020  Patient will need follow up appt for refills, Thanks !

## 2021-06-10 ENCOUNTER — Ambulatory Visit (INDEPENDENT_AMBULATORY_CARE_PROVIDER_SITE_OTHER): Payer: BC Managed Care – PPO | Admitting: Internal Medicine

## 2021-06-10 ENCOUNTER — Encounter: Payer: Self-pay | Admitting: Internal Medicine

## 2021-06-10 ENCOUNTER — Other Ambulatory Visit: Payer: Self-pay

## 2021-06-10 VITALS — BP 168/70 | HR 85 | Ht 61.0 in | Wt 167.1 lb

## 2021-06-10 DIAGNOSIS — I10 Essential (primary) hypertension: Secondary | ICD-10-CM | POA: Diagnosis not present

## 2021-06-10 DIAGNOSIS — E785 Hyperlipidemia, unspecified: Secondary | ICD-10-CM

## 2021-06-10 DIAGNOSIS — E1169 Type 2 diabetes mellitus with other specified complication: Secondary | ICD-10-CM

## 2021-06-10 DIAGNOSIS — I2510 Atherosclerotic heart disease of native coronary artery without angina pectoris: Secondary | ICD-10-CM

## 2021-06-10 NOTE — Patient Instructions (Addendum)
Medication Instructions:   Your physician recommends that you continue on your current medications as directed. Please refer to the Current Medication list given to you today.  *If you need a refill on your cardiac medications before your next appointment, please call your pharmacy*   Lab Work:  Lipid panel today  If you have labs (blood work) drawn today and your tests are completely normal, you will receive your results only by: MyChart Message (if you have MyChart) OR A paper copy in the mail If you have any lab test that is abnormal or we need to change your treatment, we will call you to review the results.   Testing/Procedures:  None ordered   Follow-Up: At [facility], you and your health needs are our priority.  As part of our continuing mission to provide you with exceptional heart care, we have created designated Provider Care Teams.  These Care Teams include your primary Cardiologist (physician) and Advanced Practice Providers (APPs -  Physician Assistants and Nurse Practitioners) who all work together to provide you with the care you need, when you need it.  We recommend signing up for the patient portal called "MyChart".  Sign up information is provided on this After Visit Summary.  MyChart is used to connect with patients for Virtual Visits (Telemedicine).  Patients are able to view lab/test results, encounter notes, upcoming appointments, etc.  Non-urgent messages can be sent to your provider as well.   To learn more about what you can do with MyChart, go to [email or web address].    Your next appointment:   3 month(s)  The format for your next appointment:   In Person  Provider:   You may see Yvonne Kendall, MD or one of the following Advanced Practice Providers on your designated Care Team:   Nicolasa Ducking, NP Eula Listen, PA-C Marisue Ivan, PA-C Cadence Fransico Michael, [place]   Other Instructions  1) Please monitor your blood pressure at home. If BP  consistently greater than 130/80 please contact our office.  2) We recommend heart healthy diet and regular cardiovascular exercise, lifestyle modifications are encouraged.   DASH Eating Plan DASH stands for Dietary Approaches to Stop Hypertension. The DASH eating plan is a healthy eating plan that has been shown to: Reduce high blood pressure (hypertension). Reduce your risk for type 2 diabetes, heart disease, and stroke. Help with weight loss. What are tips for following this plan? Reading food labels Check food labels for the amount of salt (sodium) per serving. Choose foods with less than 5 percent of the Daily Value of sodium. Generally, foods with less than 300 milligrams (mg) of sodium per serving fit into this eating plan. To find whole grains, look for the word "whole" as the first word in the ingredient list. Shopping Buy products labeled as "low-sodium" or "no salt added." Buy fresh foods. Avoid canned foods and pre-made or frozen meals. Cooking Avoid adding salt when cooking. Use salt-free seasonings or herbs instead of table salt or sea salt. Check with your health care provider or pharmacist before using salt substitutes. Do not fry foods. Cook foods using healthy methods such as baking, boiling, grilling, roasting, and broiling instead. Cook with heart-healthy oils, such as olive, canola, avocado, soybean, or sunflower oil. Meal planning  Eat a balanced diet that includes: 4 or more servings of fruits and 4 or more servings of vegetables each day. Try to fill one-half of your plate with fruits and vegetables. 6-8 servings of whole grains each day. Less  than 6 oz (170 g) of lean meat, poultry, or fish each day. A 3-oz (85-g) serving of meat is about the same size as a deck of cards. One egg equals 1 oz (28 g). 2-3 servings of low-fat dairy each day. One serving is 1 cup (237 mL). 1 serving of nuts, seeds, or beans 5 times each week. 2-3 servings of heart-healthy fats.  Healthy fats called omega-3 fatty acids are found in foods such as walnuts, flaxseeds, fortified milks, and eggs. These fats are also found in cold-water fish, such as sardines, salmon, and mackerel. Limit how much you eat of: Canned or prepackaged foods. Food that is high in trans fat, such as some fried foods. Food that is high in saturated fat, such as fatty meat. Desserts and other sweets, sugary drinks, and other foods with added sugar. Full-fat dairy products. Do not salt foods before eating. Do not eat more than 4 egg yolks a week. Try to eat at least 2 vegetarian meals a week. Eat more home-cooked food and less restaurant, buffet, and fast food. Lifestyle When eating at a restaurant, ask that your food be prepared with less salt or no salt, if possible. If you drink alcohol: Limit how much you use to: 0-1 drink a day for women who are not pregnant. 0-2 drinks a day for men. Be aware of how much alcohol is in your drink. In the [place] one drink equals one 12 oz bottle of beer (355 mL), one 5 oz glass of wine (148 mL), or one 1 oz glass of hard liquor (44 mL). General information Avoid eating more than 2,300 mg of salt a day. If you have hypertension, you may need to reduce your sodium intake to 1,500 mg a day. Work with your health care provider to maintain a healthy body weight or to lose weight. Ask what an ideal weight is for you. Get at least 30 minutes of exercise that causes your heart to beat faster (aerobic exercise) most days of the week. Activities may include walking, swimming, or biking. Work with your health care provider or dietitian to adjust your eating plan to your individual calorie needs. What foods should I eat? Fruits All fresh, dried, or frozen fruit. Canned fruit in natural juice (without added sugar). Vegetables Fresh or frozen vegetables (raw, steamed, roasted, or grilled). Low-sodium or reduced-sodium tomato and vegetable juice. Low-sodium or reduced-sodium  tomato sauce and tomato paste. Low-sodium or reduced-sodium canned vegetables. Grains Whole-grain or whole-wheat bread. Whole-grain or whole-wheat pasta. Brown rice. Orpah Cobb. Bulgur. Whole-grain and low-sodium cereals. Pita bread. Low-fat, low-sodium crackers. Whole-wheat flour tortillas. Meats and other proteins Skinless chicken or [place]. Ground chicken or [place]. Pork with fat trimmed off. Fish and seafood. Egg whites. Dried beans, peas, or lentils. Unsalted nuts, nut butters, and seeds. Unsalted canned beans. Lean cuts of beef with fat trimmed off. Low-sodium, lean precooked or cured meat, such as sausages or meat loaves. Dairy Low-fat (1%) or fat-free (skim) milk. Reduced-fat, low-fat, or fat-free cheeses. Nonfat, low-sodium ricotta or cottage cheese. Low-fat or nonfat yogurt. Low-fat, low-sodium cheese. Fats and oils Soft margarine without trans fats. Vegetable oil. Reduced-fat, low-fat, or light mayonnaise and salad dressings (reduced-sodium). Canola, safflower, olive, avocado, soybean, and sunflower oils. Avocado. Seasonings and condiments Herbs. Spices. Seasoning mixes without salt. Other foods Unsalted popcorn and pretzels. Fat-free sweets. The items listed above may not be a complete list of foods and beverages you can eat. Contact a dietitian for more information. What foods should  I avoid? Fruits Canned fruit in a light or heavy syrup. Fried fruit. Fruit in cream or butter sauce. Vegetables Creamed or fried vegetables. Vegetables in a cheese sauce. Regular canned vegetables (not low-sodium or reduced-sodium). Regular canned tomato sauce and paste (not low-sodium or reduced-sodium). Regular tomato and vegetable juice (not low-sodium or reduced-sodium). Rosita Fire. Olives. Grains Baked goods made with fat, such as croissants, muffins, or some breads. Dry pasta or rice meal packs. Meats and other proteins Fatty cuts of meat. Ribs. Fried meat. Tomasa Blase. Bologna, salami, and other  precooked or cured meats, such as sausages or meat loaves. Fat from the back of a pig (fatback). Bratwurst. Salted nuts and seeds. Canned beans with added salt. Canned or smoked fish. Whole eggs or egg yolks. Chicken or [place] with skin. Dairy Whole or 2% milk, cream, and half-and-half. Whole or full-fat cream cheese. Whole-fat or sweetened yogurt. Full-fat cheese. Nondairy creamers. Whipped toppings. Processed cheese and cheese spreads. Fats and oils Butter. Stick margarine. Lard. Shortening. Ghee. Bacon fat. Tropical oils, such as coconut, palm kernel, or palm oil. Seasonings and condiments Onion salt, garlic salt, seasoned salt, table salt, and sea salt. Worcestershire sauce. Tartar sauce. Barbecue sauce. Teriyaki sauce. Soy sauce, including reduced-sodium. Steak sauce. Canned and packaged gravies. Fish sauce. Oyster sauce. Cocktail sauce. Store-bought horseradish. Ketchup. Mustard. Meat flavorings and tenderizers. Bouillon cubes. Hot sauces. Pre-made or packaged marinades. Pre-made or packaged taco seasonings. Relishes. Regular salad dressings. Other foods Salted popcorn and pretzels. The items listed above may not be a complete list of foods and beverages you should avoid. Contact a dietitian for more information. Where to find more information National Heart, Lung, and Blood Institute: PopSteam.is American Heart Association: www.heart.org Academy of Nutrition and Dietetics: www.eatright.org National Kidney Foundation: www.kidney.org Summary The DASH eating plan is a healthy eating plan that has been shown to reduce high blood pressure (hypertension). It may also reduce your risk for type 2 diabetes, heart disease, and stroke. When on the DASH eating plan, aim to eat more fresh fruits and vegetables, whole grains, lean proteins, low-fat dairy, and heart-healthy fats. With the DASH eating plan, you should limit salt (sodium) intake to 2,300 mg a day. If you have hypertension, you may need  to reduce your sodium intake to 1,500 mg a day. Work with your health care provider or dietitian to adjust your eating plan to your individual calorie needs. This information is not intended to replace advice given to you by your health care provider. Make sure you discuss any questions you have with your health care provider. Document Revised: 08/29/2019 Document Reviewed: 08/29/2019 Elsevier Patient Education  2022 [employer].

## 2021-06-10 NOTE — Progress Notes (Signed)
Follow-up Outpatient Visit Date: 06/10/2021  Primary Care Provider: Olin Hauser, DO [address] [address]  Chief Complaint: Follow-up coronary artery disease  HPI:  Olivia Roth is a 65 y.o. female with history of coronary artery disease status post urgent CABG in the setting of NSTEMI in 08/2018, hyperlipidemia, diabetes mellitus, GERD, and anxiety, who presents for follow-up of coronary artery disease.  I last saw her in 08/2020, at which time she was feeling well, complaining only of cold intolerance.  Due to mildly elevated LDL, I recommended adding ezetimibe.  However, Olivia Roth declined in favor of lifestyle modifications.  Today, Olivia Roth reports that she has been feeling quite well, denying chest pain, shortness of breath, palpitations, lightheadedness, and edema.  She is frustrated by her inability to lose weight despite trying to walk at least 7000 steps a day, modify her diet, and drink more water.  She is tolerating her medications well.  She attributes her elevated blood pressure today to anxiety and rushing to today's appointment.  --------------------------------------------------------------------------------------------------  Past Medical History:  Diagnosis Date   Anxiety    Asthma    In cold weather (08/21/2018)   CAD (coronary artery disease)    a. 08/2018 NSTEMI/Cath: LM 50d,LAD 80ost/p, 65m70d, LCX 90ost, OM1 50, OM3 70, OM4 80, LPDA small, RCA  99p/d, EF 30-35%; c. 08/2018 CABG x 4: LIMA->LAD, VG->RI, VG->OM, VG->RCA.   Chronic combined systolic (congestive) and diastolic (congestive) heart failure ([place]    a. 08/2018 Echo: EF 40-45%, mid-apicalanteroseptal and apical HK. Gr2 DD. Mild AI; b. 08/2018 intraop TEE: EF 45-50%, antsept, infsept HK. Mild AI. Mildly dil Ao root. Trace MR.   Family history of adverse reaction to anesthesia    "mother PONV; talked crazy/loud after anesthesia" (08/21/2018)   GERD (gastroesophageal reflux disease)     High cholesterol    Ischemic cardiomyopathy    a. 08/2018 Echo: EF 40-45%; b. 08/2018 TEE: EF 45-50%.   Seasonal allergies    Tuberculosis ~ 1960   Type II diabetes mellitus [place]    UTI (lower urinary tract infection)    "once; before hysterectomy" (08/21/2018)   Past Surgical History:  Procedure Laterality Date   CARDIAC CATHETERIZATION     CORONARY ARTERY BYPASS GRAFT N/A 08/26/2018   Procedure: CORONARY ARTERY BYPASS GRAFTING (CABG) times four using left internal mammary artery and right leg saphenous vein grafts;  Surgeon: VIvin Poot MD;  [facility] [facility]  Service: Open Heart Surgery;  Laterality: N/A;   GANGLION CYST EXCISION Left ~ 1960   neck   LEFT HEART CATH AND CORONARY ANGIOGRAPHY N/A 08/21/2018   Procedure: LEFT HEART CATH AND CORONARY ANGIOGRAPHY;  Surgeon: ENelva Bush MD;  [facility];  Service: Cardiovascular;  Laterality: N/A;   TEE WITHOUT CARDIOVERSION N/A 08/26/2018   Procedure: TRANSESOPHAGEAL ECHOCARDIOGRAM (TEE);  Surgeon: VPrescott Gum PCollier Salina MD;  [place] [facility]  Service: Open Heart Surgery;  Laterality: N/A;   TOTAL ABDOMINAL HYSTERECTOMY  2006     Current Meds  Medication Sig   acetaminophen (TYLENOL) 500 MG tablet Take 2 tablets (1,000 mg total) by mouth every 6 (six) hours as needed.   albuterol (VENTOLIN HFA) 108 (90 Base) MCG/ACT inhaler Inhale 2 puffs into the lungs every 6 (six) hours as needed for wheezing or shortness of breath.   aspirin EC 81 MG tablet Take 1 tablet (81 mg total) by mouth daily.   atorvastatin (LIPITOR) 80 MG tablet TAKE 1 TABLET  DAILY   blood glucose meter kit and supplies KIT Dispense based on patient and insurance preference. two times daily as directed. For E11.65   glucose blood test strip Use as instructed   Lancets (ONETOUCH ULTRASOFT) lancets TEST TWICE DAILY   metFORMIN (GLUCOPHAGE) 500 MG tablet Take 2 tablets in morning with meal and take 1 tablet in evening with meal   metoprolol tartrate  (LOPRESSOR) 25 MG tablet TAKE 1 TABLET TWICE A DAY    Allergies: Shellfish allergy, Avocado, Advair diskus [fluticasone-salmeterol], Cortisone, Pecan nut (diagnostic), and Salmon [fish allergy]  Social History   Tobacco Use   Smoking status: Never   Smokeless tobacco: Never  Vaping Use   Vaping Use: Never used  Substance Use Topics   Alcohol use: Not Currently   Drug use: Never    Family History  Problem Relation Age of Onset   Cancer Sister    Heart disease Neg Hx     Review of Systems: A 12-system review of systems was performed and was negative except as noted in the HPI.  --------------------------------------------------------------------------------------------------  Physical Exam: BP (!) 168/70 (BP Location: Left Arm, Patient Position: Sitting, Cuff Size: Large)   Pulse 85   Ht '5\' 1"'  (1.549 m)   Wt 167 lb 2 oz (75.8 kg)   SpO2 99%   BMI 31.58 kg/m  Repeat BP: 138/62  General:  NAD. Neck: No JVD or HJR. Lungs: Clear to auscultation bilaterally without wheezes or crackles. Heart: Regular rate and rhythm without murmurs, rubs, or gallops. Abdomen: Soft, nontender, nondistended. Extremities: No lower extremity edema.  EKG: Normal sinus rhythm with poor R wave progression.  No significant change from prior tracing on 08/18/2020.  Lab Results  Component Value Date   WBC 4.5 08/18/2020   HGB 12.7 08/18/2020   HCT 39.3 08/18/2020   MCV 93 08/18/2020   PLT 203 08/18/2020    Lab Results  Component Value Date   NA 139 08/18/2020   K 4.6 08/18/2020   CL 103 08/18/2020   CO2 22 08/18/2020   BUN 24 08/18/2020   CREATININE 0.93 08/18/2020   GLUCOSE 266 (H) 08/18/2020   ALT 16 08/18/2020    Lab Results  Component Value Date   CHOL 137 08/18/2020   HDL 39 (L) 08/18/2020   LDLCALC 72 08/18/2020   TRIG 151 (H) 08/18/2020   CHOLHDL 3.5 08/18/2020     --------------------------------------------------------------------------------------------------  ASSESSMENT AND PLAN: Coronary artery disease: Olivia Roth continues to do well without angina.  We will continue her current medications for secondary prevention including aspirin, atorvastatin, and metoprolol.  I encouraged her to increase her activity as tolerated and to work on weight loss through diet and exercise.  Hypertension: Blood pressure moderately elevated on initial check, improved but still suboptimal on recheck.  Looking back over the last few years, most blood pressures in the office have been above our goal of 130/80.  I have recommended escalation of pharmacotherapy, which Olivia Roth wishes to avoid.  She asks to work on further lifestyle modifications for the next 3 months before we readdress adding additional medications.  I would favor adding an ACE inhibitor or ARB in the future if BP remains consistently above 130/80, given her history of diabetes mellitus.  Hyperlipidemia associated with type 2 diabetes mellitus: Most recent lipid panel in 08/2020 was notable for mildly elevated triglycerides and LDL.  Addition of ezetimibe was recommended, though Olivia Roth wish to avoid this.  She has remained on atorvastatin 80 mg  daily, which we will continue.  We will recheck a lipid panel today and continue to work on lifestyle modifications.  If LDL remains above 70, addition of ezetimibe or alternative agent will need to be readdressed.  Follow-up: Return to clinic in 3 months.  Nelva Bush, MD 06/10/2021 9:03 AM

## 2021-06-11 LAB — LIPID PANEL
Chol/HDL Ratio: 3.8 ratio (ref 0.0–4.4)
Cholesterol, Total: 133 mg/dL (ref 100–199)
HDL: 35 mg/dL — ABNORMAL LOW (ref >39)
LDL Chol Calc (NIH): 63 mg/dL (ref 0–99)
Triglycerides: 215 mg/dL — ABNORMAL HIGH (ref 0–149)
VLDL Cholesterol Cal: 35 mg/dL (ref 5–40)

## 2021-07-12 ENCOUNTER — Other Ambulatory Visit: Payer: Self-pay | Admitting: Family Medicine

## 2021-07-12 DIAGNOSIS — J4520 Mild intermittent asthma, uncomplicated: Secondary | ICD-10-CM

## 2021-07-26 ENCOUNTER — Other Ambulatory Visit: Payer: Self-pay | Admitting: Internal Medicine

## 2021-08-09 ENCOUNTER — Encounter: Payer: Self-pay | Admitting: Family Medicine

## 2021-08-09 ENCOUNTER — Ambulatory Visit (INDEPENDENT_AMBULATORY_CARE_PROVIDER_SITE_OTHER): Payer: BC Managed Care – PPO | Admitting: Family Medicine

## 2021-08-09 ENCOUNTER — Ambulatory Visit: Payer: Self-pay | Admitting: *Deleted

## 2021-08-09 ENCOUNTER — Other Ambulatory Visit: Payer: Self-pay

## 2021-08-09 VITALS — BP 183/60 | HR 86 | Temp 98.6°F | Ht 61.0 in | Wt 161.6 lb

## 2021-08-09 DIAGNOSIS — J0100 Acute maxillary sinusitis, unspecified: Secondary | ICD-10-CM | POA: Diagnosis not present

## 2021-08-09 MED ORDER — FLUTICASONE PROPIONATE 50 MCG/ACT NA SUSP: 2.0000 | Freq: Every day | NASAL | 3 refills | Status: DC

## 2021-08-09 MED ORDER — AMOXICILLIN-POT CLAVULANATE 875-125 MG PO TABS: 1.0000 | ORAL_TABLET | Freq: Two times a day (BID) | ORAL | 0 refills | Status: DC

## 2021-08-09 NOTE — Patient Instructions (Signed)
Thank you for coming to the office today.    Please schedule a Follow-up Appointment to: No follow-ups on file.  If you have any other questions or concerns, please feel free to call the office or send a message through MyChart. You may also schedule an earlier appointment if necessary.  Additionally, you may be receiving a survey about your experience at our office within a few days to 1 week by e-mail or mail. We value your feedback.  Naira Standiford, DO [facility], [facility] 

## 2021-08-09 NOTE — Telephone Encounter (Signed)
Reason for Disposition  MODERATE diarrhea (e.g., 4-6 times / day more than normal)  Answer Assessment - Initial Assessment Questions 1. ANTIBIOTIC: "What antibiotic are you taking?" "How many times per day?"     Augmentin  2. ANTIBIOTIC ONSET: "When was the antibiotic started?"     1045 today 3. DIARRHEA SEVERITY: "How bad is the diarrhea?" "How many more stools have you had in the past 24 hours than normal?"    - NO DIARRHEA (SCALE 0)   - MILD (SCALE 1-3): Few loose or mushy BMs; increase of 1-3 stools over normal daily number of stools; mild increase in ostomy output.   -  MODERATE (SCALE 4-7): Increase of 4-6 stools daily over normal; moderate increase in ostomy output. * SEVERE (SCALE 8-10; OR 'WORST POSSIBLE'): Increase of 7 or more stools daily over normal; moderate increase in ostomy output; incontinence.     severe 4. ONSET: "When did the diarrhea begin?"      1400  today 5. BM CONSISTENCY: "How loose or watery is the diarrhea?"      Loose and watery 6. VOMITING: "Are you also vomiting?" If Yes, ask: "How many times in the past 24 hours?"      no 7. ABDOMINAL PAIN: "Are you having any abdominal pain?" If Yes, ask: "What does it feel like?" (e.g., crampy, dull, intermittent, constant)      no 8. ABDOMINAL PAIN SEVERITY: If present, ask: "How bad is the pain?"  (e.g., Scale 1-10; mild, moderate, or severe)   - MILD (1-3): doesn't interfere with normal activities, abdomen soft and not tender to touch    - MODERATE (4-7): interferes with normal activities or awakens from sleep, abdomen tender to touch    - SEVERE (8-10): excruciating pain, doubled over, unable to do any normal activities      none 9. ORAL INTAKE: If vomiting, "Have you been able to drink liquids?" "How much liquids have you had in the past 24 hours?"     Yes 10. HYDRATION: "Any signs of dehydration?" (e.g., dry mouth [not just dry lips], too weak to stand, dizziness, new weight loss) "When did you last urinate?"        no 11. EXPOSURE: "Have you traveled to a foreign country recently?" "Have you been exposed to anyone with diarrhea?" "Could you have eaten any food that was spoiled?"       no  Protocols used: Diarrhea on Antibiotics-A-AH

## 2021-08-09 NOTE — Progress Notes (Signed)
Subjective:    Patient ID: Olivia Roth, female    DOB: 03/27/1956, 65 y.o.   MRN: [id number]  Olivia Roth is a 65 y.o. female presenting on 08/09/2021 for Cough and mucus  Patient presents for a same day appointment.   HPI  Sinusitis Reports symptoms started 1 week ago with sore throat, and then developed sinus pressure congestion pain, thicker sputum production green tinged Admits R eyelid swelling with some crusty discharge but no redness of eye, no eye pain, no loss of vision Husband not sick at home Admits headache pressure Denies fever chills sweats Denies dyspnea wheezing  No Flu Shot currently COVID Vaccine previously.  Depression screen [facility] 2/9 08/18/2020 03/10/2019 10/24/2018  Decreased Interest 0 0 0  Down, Depressed, Hopeless 0 0 0  PHQ - 2 Score 0 0 0  Altered sleeping - - 0  Tired, decreased energy - - 0  Change in appetite - - 0  Feeling bad or failure about yourself  - - 0  Trouble concentrating - - 0  Moving slowly or fidgety/restless - - 0  Suicidal thoughts - - 0  PHQ-9 Score - - 0  Difficult doing work/chores - - Not difficult at all    Social History   Tobacco Use   Smoking status: Never   Smokeless tobacco: Never  Vaping Use   Vaping Use: Never used  Substance Use Topics   Alcohol use: Not Currently   Drug use: Never    Review of Systems Per HPI unless specifically indicated above     Objective:    BP (!) 183/60   Pulse 86   Temp 98.6 F (37 C) (Oral)   Ht 5\' 1"  (1.549 m)   Wt 161 lb 9.6 oz (73.3 kg)   SpO2 99%   BMI 30.53 kg/m   Wt Readings from Last 3 Encounters:  08/09/21 161 lb 9.6 oz (73.3 kg)  06/10/21 167 lb 2 oz (75.8 kg)  08/18/20 161 lb (73 kg)    Physical Exam Vitals and nursing note reviewed.  Constitutional:      General: She is not in acute distress.    Appearance: She is well-developed. She is not diaphoretic.     Comments: Well-appearing, comfortable, cooperative  HENT:     Head: Normocephalic and  atraumatic.  Eyes:     General:        Right eye: No discharge.        Left eye: No discharge.     Conjunctiva/sclera: Conjunctivae normal.  Neck:     Thyroid: No thyromegaly.  Cardiovascular:     Rate and Rhythm: Normal rate and regular rhythm.     Heart sounds: Normal heart sounds. No murmur heard. Pulmonary:     Effort: Pulmonary effort is normal. No respiratory distress.     Breath sounds: Normal breath sounds. No wheezing or rales.  Musculoskeletal:        General: Normal range of motion.     Cervical back: Normal range of motion and neck supple.  Lymphadenopathy:     Cervical: No cervical adenopathy.  Skin:    General: Skin is warm and dry.     Findings: No erythema or rash.  Neurological:     Mental Status: She is alert and oriented to person, place, and time.  Psychiatric:        Behavior: Behavior normal.     Comments: Well groomed, good eye contact, normal speech and thoughts  Results for orders placed or performed in visit on 06/10/21  Lipid Profile  Result Value Ref Range   Cholesterol, Total 133 100 - 199 mg/dL   Triglycerides 740 (H) 0 - 149 mg/dL   HDL 35 (L) >81 mg/dL   VLDL Cholesterol Cal 35 5 - 40 mg/dL   LDL Chol Calc (NIH) 63 0 - 99 mg/dL   Chol/HDL Ratio 3.8 0.0 - 4.4 ratio      Assessment & Plan:   Problem List Items Addressed This Visit   None Visit Diagnoses     Acute non-recurrent maxillary sinusitis    -  Primary       Consistent with acute maxillary sinusitis, likely initially viral URI vs allergic rhinitis component with worsening concern for bacterial infection.   Plan: 1. Start Augmentin 875-125mg  PO BID x 10 days 2. Start nasal steroid Flonase 2 sprays in each nostril daily for 4-6 weeks, may repeat course seasonally or as needed 3. Supportive care with nasal saline OTC, hydration, decongestant PRN 4. Return criteria reviewed   No orders of the defined types were placed in this encounter.     Follow up plan: Return  in about 5 weeks (around 09/13/2021) for 5 week follow-up DM A1c.   Saralyn Pilar, DO [facility] 08/09/2021, 9:07 AM

## 2021-08-09 NOTE — Telephone Encounter (Signed)
Pt reports took Augmentin at 1045, has had 5 episodes of diarrhea from 1400-1630. States stools loose and watery. Denies abdominal pain, no signs dehydration, no dizziness, no nausea. Care advise given. Please advise.

## 2021-08-10 NOTE — Telephone Encounter (Signed)
Please let her know  Diarrhea is a common side effect of antibiotics including the Augmentin.  A few options, if she wants to keep trying the same antibiotic, then I would recommend taking it with meal / yogurt especially and also try a Probiotic OTC capsule supplement to help limit effect of antibiotic causing diarrhea.  If this problem persists for 1-2 more days, and she prefers to switch antibiotic, can call or message back and we can switch it.   Saralyn Pilar, DO [facility] 08/10/2021, 1:16 PM

## 2021-08-15 ENCOUNTER — Ambulatory Visit: Payer: BC Managed Care – PPO

## 2021-08-22 ENCOUNTER — Other Ambulatory Visit: Payer: Self-pay | Admitting: Family Medicine

## 2021-08-22 DIAGNOSIS — E1159 Type 2 diabetes mellitus with other circulatory complications: Secondary | ICD-10-CM

## 2021-08-22 NOTE — Telephone Encounter (Signed)
Requested medication (s) are due for refill today:   Yes  Requested medication (s) are on the active medication list:   Yes  Future visit scheduled:   No   Last ordered: 08/18/2020 #270, 3 refills  Returned because lab work is due per protocol   Requested Prescriptions  Pending Prescriptions Disp Refills   metFORMIN (GLUCOPHAGE) 500 MG tablet [Pharmacy Med Name: METFORMIN HCL TABS 500MG] 270 tablet 3    Sig: TAKE 2 TABLETS IN THE MORNING AND 1 TABLET IN THE EVENING WITH MEALS     Endocrinology:  Diabetes - Biguanides Failed - 08/22/2021  1:35 AM      Failed - Cr in normal range and within 360 days    Creat  Date Value Ref Range Status  03/05/2019 0.90 0.50 - 0.99 mg/dL Final    Comment:    For patients >25 years of age, the reference limit for Creatinine is approximately 13% higher for people identified as African-American. .    Creatinine, Ser  Date Value Ref Range Status  08/18/2020 0.93 0.57 - 1.00 mg/dL Final          Failed - HBA1C is between 0 and 7.9 and within 180 days    Hemoglobin A1C  Date Value Ref Range Status  08/18/2020 6.7 (A) 4.0 - 5.6 % Final   Hgb A1c MFr Bld  Date Value Ref Range Status  03/12/2019 6.4 (H) <5.7 % of total Hgb Final    Comment:    For someone without known diabetes, a hemoglobin  A1c value between 5.7% and 6.4% is consistent with prediabetes and should be confirmed with a  follow-up test. . For someone with known diabetes, a value <7% indicates that their diabetes is well controlled. A1c targets should be individualized based on duration of diabetes, age, comorbid conditions, and other considerations. . This assay result is consistent with an increased risk of diabetes. . Currently, no consensus exists regarding use of hemoglobin A1c for diagnosis of diabetes for children. .           Failed - eGFR in normal range and within 360 days    GFR, Est African American  Date Value Ref Range Status  03/05/2019 78 > OR =  60 mL/min/1.3m Final   GFR calc Af Amer  Date Value Ref Range Status  08/18/2020 75 >59 mL/min/1.73 Final    Comment:    **In accordance with recommendations from the NKF-ASN Task force,**   [facility] is in the process of updating its eGFR calculation to the   2021 CKD-EPI creatinine equation that estimates kidney function   without a race variable.    GFR, Est Non African American  Date Value Ref Range Status  03/05/2019 68 > OR = 60 mL/min/1.742mFinal   GFR calc non Af Amer  Date Value Ref Range Status  08/18/2020 65 >59 mL/min/1.73 Final          Passed - Valid encounter within last 6 months    Recent Outpatient Visits           1 week ago Acute non-recurrent maxillary sinusitis   Olivia Roth   1 year [facility]   1 year ago DyHanaFNP   1 year ago Chronic bursitis of left shoulder   [place]  Devonne Doughty, DO   2 years ago Type 2 diabetes mellitus with other specified complication, without long-term current use of insulin ([place])   [place], DO       Future Appointments             In 2 weeks End, Harrell Gave, MD [facility]

## 2021-09-09 ENCOUNTER — Ambulatory Visit: Payer: BC Managed Care – PPO | Admitting: Internal Medicine

## 2021-09-28 ENCOUNTER — Other Ambulatory Visit: Payer: Self-pay

## 2021-09-28 ENCOUNTER — Encounter: Payer: Self-pay | Admitting: Family Medicine

## 2021-09-28 ENCOUNTER — Ambulatory Visit (INDEPENDENT_AMBULATORY_CARE_PROVIDER_SITE_OTHER): Payer: BC Managed Care – PPO | Admitting: Family Medicine

## 2021-09-28 VITALS — BP 128/70 | HR 62 | Ht 61.0 in | Wt 164.0 lb

## 2021-09-28 DIAGNOSIS — E1159 Type 2 diabetes mellitus with other circulatory complications: Secondary | ICD-10-CM | POA: Diagnosis not present

## 2021-09-28 DIAGNOSIS — Z0000 Encounter for general adult medical examination without abnormal findings: Secondary | ICD-10-CM

## 2021-09-28 DIAGNOSIS — E1169 Type 2 diabetes mellitus with other specified complication: Secondary | ICD-10-CM | POA: Diagnosis not present

## 2021-09-28 DIAGNOSIS — E781 Pure hyperglyceridemia: Secondary | ICD-10-CM | POA: Diagnosis not present

## 2021-09-28 DIAGNOSIS — Z23 Encounter for immunization: Secondary | ICD-10-CM | POA: Diagnosis not present

## 2021-09-28 DIAGNOSIS — E785 Hyperlipidemia, unspecified: Secondary | ICD-10-CM

## 2021-09-28 LAB — POCT GLYCOSYLATED HEMOGLOBIN (HGB A1C): Hemoglobin A1C: 7.5 % — AB (ref 4.0–5.6)

## 2021-09-28 NOTE — Assessment & Plan Note (Addendum)
Elevated A1c 7.5 Resolved hypoglycemia. No hyperglycemia Complications - hyperlipidemia, CAD history of NSTEMI, GERD - increases risk of future cardiovascular complications    Plan:  1. Continue current therapy - Metformin 500mg  x 2 in AM and x 1 in PM RECOMMEND NEW START GLP1 - handout given she will check into options contact if when ready to pursue  2. Encourage improved lifestyle - low carb, low sugar diet, reduce portion size, continue improving regular exercise 3. Check CBG , bring log to next visit for review 4. Continue ASA, Statin

## 2021-09-28 NOTE — Progress Notes (Signed)
Subjective:    Patient ID: Olivia Roth, female    DOB: 1956-09-11, 64 y.o.   MRN: [id number]  Olivia Roth is a 65 y.o. female presenting on 09/28/2021 for Diabetes   HPI  CHRONIC DM, Type 2: Reports major improvement in lifestyle - she has overhaul diet and inc activity Meds: Metformin 534m x 2 in AM and x 1 in PM Reports good compliance. Tolerating well w/o side-effects Currently not on ACEi / ARB  Lifestyle: - Diet (major improved low carb diet) - Exercise (improving walking exercise regimen) - [name] vision will schedule this week. - Denies neuropathy Denies hypoglycemia, polyuria, visual changes, numbness or tingling.   S/p oral surgery back molar cracked.  Hypertriglyceridemia Followed by Cardiology, last checked 06/2021  Left Shoulder Pain, chronic    Health Maintenance: UTD COVID vaccine and booster - will need the updated COVID booster in Jan 2023  Due for Flu Shot, will receive today    Pneumonia vaccine due age 66+when ready PCV20  Declines Mammogram / Colonoscopy.   S/p hysterectomy, not indicated for pap.   [facility] 09/28/2021 08/18/2020 03/10/2019  Decreased Interest 0 0 0  Down, Depressed, Hopeless 0 0 0  PHQ - 2 Score 0 0 0  Altered sleeping - - -  Tired, decreased energy - - -  Change in appetite - - -  Feeling bad or failure about yourself  - - -  Trouble concentrating - - -  Moving slowly or fidgety/restless - - -  Suicidal thoughts - - -  PHQ-9 Score - - -  Difficult doing work/chores - - -    Past Medical History:  Diagnosis Date   Anxiety    Asthma    In cold weather (08/21/2018)   CAD (coronary artery disease)    a. 08/2018 NSTEMI/Cath: LM 50d,LAD 80ost/p, 613m0d, LCX 90ost, OM1 50, OM3 70, OM4 80, LPDA small, RCA  99p/d, EF 30-35%; c. 08/2018 CABG x 4: LIMA->LAD, VG->RI, VG->OM, VG->RCA.   Chronic combined systolic (congestive) and diastolic (congestive) heart failure ([facility]   a. 08/2018 Echo: EF 40-45%,  mid-apicalanteroseptal and apical HK. Gr2 DD. Mild AI; b. 08/2018 intraop TEE: EF 45-50%, antsept, infsept HK. Mild AI. Mildly dil Ao root. Trace MR.   Family history of adverse reaction to anesthesia    "mother PONV; talked crazy/loud after anesthesia" (08/21/2018)   GERD (gastroesophageal reflux disease)    High cholesterol    Ischemic cardiomyopathy    a. 08/2018 Echo: EF 40-45%; b. 08/2018 TEE: EF 45-50%.   Seasonal allergies    Tuberculosis ~ 1960   Type II diabetes mellitus ([facility]   UTI (lower urinary tract infection)    "once; before hysterectomy" (08/21/2018)   Past Surgical History:  Procedure Laterality Date   CARDIAC CATHETERIZATION     CORONARY ARTERY BYPASS GRAFT N/A 08/26/2018   Procedure: CORONARY ARTERY BYPASS GRAFTING (CABG) times four using left internal mammary artery and right leg saphenous vein grafts;  Surgeon: VaIvin PootMD;  [facility] Service: Open [facility] Surgery;  Laterality: N/A;   GANGLION CYST EXCISION Left ~ 1960   neck   LEFT HEART CATH AND CORONARY ANGIOGRAPHY N/A 08/21/2018   Procedure: LEFT HEART CATH AND CORONARY ANGIOGRAPHY;  Surgeon: EnNelva BushMD;  [facility];  Service: Cardiovascular;  Laterality: N/A;   TEE WITHOUT CARDIOVERSION N/A 08/26/2018   Procedure: TRANSESOPHAGEAL ECHOCARDIOGRAM (TEE);  Surgeon: VaPrescott GumPeCollier SalinaMD;  Location: [facility] OR;  Service: Open Heart Surgery;  Laterality: N/A;   TOTAL ABDOMINAL HYSTERECTOMY  2006   Social History   Socioeconomic History   Marital status: Married    Spouse name: Not on file   Number of children: 2   Years of education: 18   Highest education level: Not on file  Occupational History   Occupation: Passenger transport manager: [employer]    Comment: Oceanographer  Tobacco Use   Smoking status: Never   Smokeless tobacco: Never  Vaping Use   Vaping Use: Never used  Substance and Sexual Activity   Alcohol use: Not Currently   Drug use: Never    Sexual activity: Not on file  Other Topics Concern   Not on file  Social History Narrative   Not on file   Social Determinants of Health   Financial Resource Strain: Not on file  Food Insecurity: Not on file  Transportation Needs: Not on file  Physical Activity: Not on file  Stress: Not on file  Social Connections: Not on file  Intimate Partner Violence: Not on file   Family History  Problem Relation Age of Onset   Cancer Sister    Heart disease Neg Hx    Current Outpatient Medications on File Prior to Visit  Medication Sig   acetaminophen (TYLENOL) 500 MG tablet Take 2 tablets (1,000 mg total) by mouth every 6 (six) hours as needed.   albuterol (VENTOLIN HFA) 108 (90 Base) MCG/ACT inhaler INHALE 2 PUFFS INTO THE LUNGS EVERY 6 HOURS AS NEEDED FOR WHEEZING OR SHORTNESS OF BREATH   aspirin EC 81 MG tablet Take 1 tablet (81 mg total) by mouth daily.   atorvastatin (LIPITOR) 80 MG tablet TAKE 1 TABLET DAILY   blood glucose meter kit and supplies KIT Dispense based on patient and insurance preference. two times daily as directed. For E11.65   fluticasone (FLONASE) 50 MCG/ACT nasal spray Place 2 sprays into both nostrils daily. Use for 4-6 weeks then stop and use seasonally or as needed.   glucose blood test strip Use as instructed   Lancets (ONETOUCH ULTRASOFT) lancets TEST TWICE DAILY   metFORMIN (GLUCOPHAGE) 500 MG tablet TAKE 2 TABLETS IN THE MORNING AND 1 TABLET IN THE EVENING WITH MEALS   metoprolol tartrate (LOPRESSOR) 25 MG tablet TAKE 1 TABLET TWICE A DAY   No current facility-administered medications on file prior to visit.    Review of Systems  Constitutional:  Negative for activity change, appetite change, chills, diaphoresis, fatigue and fever.  HENT:  Negative for congestion and hearing loss.   Eyes:  Negative for visual disturbance.  Respiratory:  Negative for cough, chest tightness, shortness of breath and wheezing.   Cardiovascular:  Negative for chest pain,  palpitations and leg swelling.  Gastrointestinal:  Negative for abdominal pain, constipation, diarrhea, nausea and vomiting.  Genitourinary:  Negative for dysuria, frequency and hematuria.  Musculoskeletal:  Negative for arthralgias and neck pain.  Skin:  Negative for rash.  Neurological:  Negative for dizziness, weakness, light-headedness, numbness and headaches.  Hematological:  Negative for adenopathy.  Psychiatric/Behavioral:  Negative for behavioral problems, dysphoric mood and sleep disturbance.   Per HPI unless specifically indicated above      Objective:    BP 128/70 (BP Location: Left Arm, Patient Position: Sitting, Cuff Size: Normal)    Pulse 62    Ht '5\' 1"'  (1.549 m)    Wt 164 lb (74.4 kg)    SpO2 100%  BMI 30.99 kg/m   Wt Readings from Last 3 Encounters:  09/28/21 164 lb (74.4 kg)  08/09/21 161 lb 9.6 oz (73.3 kg)  06/10/21 167 lb 2 oz (75.8 kg)    Physical Exam Vitals and nursing note reviewed.  Constitutional:      General: She is not in acute distress.    Appearance: She is well-developed. She is not diaphoretic.     Comments: Well-appearing, comfortable, cooperative  HENT:     Head: Normocephalic and atraumatic.  Eyes:     General:        Right eye: No discharge.        Left eye: No discharge.     Conjunctiva/sclera: Conjunctivae normal.     Pupils: Pupils are equal, round, and reactive to light.  Neck:     Thyroid: No thyromegaly.  Cardiovascular:     Rate and Rhythm: Normal rate and regular rhythm.     Pulses: Normal pulses.     Heart sounds: Normal heart sounds. No murmur heard. Pulmonary:     Effort: Pulmonary effort is normal. No respiratory distress.     Breath sounds: Normal breath sounds. No wheezing or rales.  Abdominal:     General: Bowel sounds are normal. There is no distension.     Palpations: Abdomen is soft. There is no mass.     Tenderness: There is no abdominal tenderness.  Musculoskeletal:        General: No tenderness. Normal range  of motion.     Cervical back: Normal range of motion and neck supple.     Comments: Upper / Lower Extremities: - Normal muscle tone, strength bilateral upper extremities 5/5, lower extremities 5/5  Lymphadenopathy:     Cervical: No cervical adenopathy.  Skin:    General: Skin is warm and dry.     Findings: No erythema or rash.  Neurological:     Mental Status: She is alert and oriented to person, place, and time.     Comments: Distal sensation intact to light touch all extremities  Psychiatric:        Mood and Affect: Mood normal.        Behavior: Behavior normal.        Thought Content: Thought content normal.     Comments: Well groomed, good eye contact, normal speech and thoughts    Recent Labs    09/28/21 1056  HGBA1C 7.5*    Diabetic Foot Exam - Simple   Simple Foot Form Diabetic Foot exam was performed with the following findings: Yes 09/28/2021 11:01 AM  Visual Inspection No deformities, no ulcerations, no other skin breakdown bilaterally: Yes Sensation Testing Intact to touch and monofilament testing bilaterally: Yes Pulse Check Posterior Tibialis and Dorsalis pulse intact bilaterally: Yes Comments       Results for orders placed or performed in visit on 09/28/21  POCT HgB A1C  Result Value Ref Range   Hemoglobin A1C 7.5 (A) 4.0 - 5.6 %      Assessment & Plan:   Problem List Items Addressed This Visit     Type 2 diabetes mellitus with vascular disease ([place])    Elevated A1c 7.5 Resolved hypoglycemia. No hyperglycemia Complications - hyperlipidemia, CAD history of NSTEMI, GERD - increases risk of future cardiovascular complications    Plan:  1. Continue current therapy - Metformin 551m x 2 in AM and x 1 in PM RECOMMEND NEW START GLP1 - handout given she will check into options contact [place] when  ready to pursue  2. Encourage improved lifestyle - low carb, low sugar diet, reduce portion size, continue improving regular exercise 3. Check CBG , bring  log to next visit for review 4. Continue ASA, Statin      Relevant Orders   POCT HgB A1C (Completed)   Urine Microalbumin w/creat. ratio   COMPLETE METABOLIC PANEL WITH GFR   CBC with Differential/Platelet   Hyperlipidemia associated with type 2 diabetes mellitus (HCC)    Elevated TG in DM Last lipids done 06/2021 Continue therapy      Other Visit Diagnoses     Annual physical exam    -  Primary   Relevant Orders   COMPLETE METABOLIC PANEL WITH GFR   CBC with Differential/Platelet   TSH   Needs flu shot       Relevant Orders   Flu Vaccine QUAD High Dose(Fluad) (Completed)   Hypertriglyceridemia       Relevant Orders   COMPLETE METABOLIC PANEL WITH GFR   TSH       Updated Health Maintenance information Future Pneumonia shot when ready. Flu Shot today. January 2023 [place] booster Reviewed recent lab results with patient Encouraged improvement to lifestyle with diet and exercise Goal of weight loss     No orders of the defined types were placed in this encounter.     Follow up plan: Return in about 6 months (around 03/29/2022) for 6 month DM A1c.  Nobie Putnam, [facility] 09/28/2021, 10:54 AM

## 2021-09-28 NOTE — Patient Instructions (Addendum)
Thank you for coming to the office today.  Future Pneumonia shot when ready.  Flu Shot today.  January 2023 COVID booster  Call insurance find cost and coverage of the following   WEEKLY injections 1. Ozempic (Semaglutide injection) - start 0.25mg  weekly for 4 weeks then increase to 0.5mg  weekly (Also comes in a DAILY pill)   2. Trulicity (Dulaglutide) - once weekly - this is very good one, usually one of my top choices as well, two doses, 0.75 (likely we would start) and 1.5 max dose. We can use coupon card here too   3. [facility] one - Mounjaro 2.5mg  weekly inj  We can get these newer meds at low cost if you are interested.  3 benefits - 1 significantly reduced A1c sugar, and may be able to reduce or stop metformin in future - 2 reduced appetite and weight loss with good results - 3 cardiovascular risk reduction, less likely to have heart attack/stroke   Please schedule a Follow-up Appointment to: Return in about 6 months (around 03/29/2022) for 6 month DM A1c.  If you have any other questions or concerns, please feel free to call the office or send a message through MyChart. You may also schedule an earlier appointment if necessary.  Additionally, you may be receiving a survey about your experience at our office within a few days to 1 week by e-mail or mail. We value your feedback.  Saralyn Pilar, DO [facility], [place]

## 2021-09-28 NOTE — Assessment & Plan Note (Signed)
Elevated TG in DM Last lipids done 06/2021 Continue therapy

## 2021-09-29 ENCOUNTER — Ambulatory Visit: Payer: BC Managed Care – PPO | Admitting: Family Medicine

## 2021-09-29 LAB — TSH: TSH: 1.49 mIU/L (ref 0.40–4.50)

## 2021-09-29 LAB — MICROALBUMIN / CREATININE URINE RATIO
Creatinine, Urine: 82 mg/dL (ref 20–275)
Microalb Creat Ratio: 17 mcg/mg creat (ref <30)
Microalb, Ur: 1.4 mg/dL

## 2021-09-29 LAB — CBC WITH DIFFERENTIAL/PLATELET
Absolute Monocytes: 402 cells/uL (ref 200–950)
Basophils Absolute: 72 cells/uL (ref 0–200)
Basophils Relative: 1.2 %
Eosinophils Absolute: 192 cells/uL (ref 15–500)
Eosinophils Relative: 3.2 %
HCT: 39.5 % (ref 35.0–45.0)
Hemoglobin: 12.8 g/dL (ref 11.7–15.5)
Lymphs Abs: 1758 cells/uL (ref 850–3900)
MCH: 30 pg (ref 27.0–33.0)
MCHC: 32.4 g/dL (ref 32.0–36.0)
MCV: 92.5 fL (ref 80.0–100.0)
MPV: 11.1 fL (ref 7.5–12.5)
Monocytes Relative: 6.7 %
Neutro Abs: 3576 cells/uL (ref 1500–7800)
Neutrophils Relative %: 59.6 %
Platelets: 231 10*3/uL (ref 140–400)
RBC: 4.27 10*6/uL (ref 3.80–5.10)
RDW: 13.1 % (ref 11.0–15.0)
Total Lymphocyte: 29.3 %
WBC: 6 10*3/uL (ref 3.8–10.8)

## 2021-09-29 LAB — COMPLETE METABOLIC PANEL WITH GFR
AG Ratio: 1.6 (calc) (ref 1.0–2.5)
ALT: 13 U/L (ref 6–29)
AST: 17 U/L (ref 10–35)
Albumin: 4.5 g/dL (ref 3.6–5.1)
Alkaline phosphatase (APISO): 86 U/L (ref 37–153)
BUN: 23 mg/dL (ref 7–25)
CO2: 28 mmol/L (ref 20–32)
Calcium: 9.7 mg/dL (ref 8.6–10.4)
Chloride: 107 mmol/L (ref 98–110)
Creat: 0.77 mg/dL (ref 0.50–1.05)
Globulin: 2.8 g/dL (calc) (ref 1.9–3.7)
Glucose, Bld: 85 mg/dL (ref 65–139)
Potassium: 5 mmol/L (ref 3.5–5.3)
Sodium: 142 mmol/L (ref 135–146)
Total Bilirubin: 0.5 mg/dL (ref 0.2–1.2)
Total Protein: 7.3 g/dL (ref 6.1–8.1)
eGFR: 85 mL/min/{1.73_m2} (ref >=60)

## 2021-10-24 ENCOUNTER — Other Ambulatory Visit: Payer: Self-pay | Admitting: Internal Medicine

## 2021-11-09 ENCOUNTER — Other Ambulatory Visit: Payer: Self-pay

## 2021-11-09 ENCOUNTER — Encounter: Payer: Self-pay | Admitting: Internal Medicine

## 2021-11-09 ENCOUNTER — Ambulatory Visit (INDEPENDENT_AMBULATORY_CARE_PROVIDER_SITE_OTHER): Payer: BC Managed Care – PPO | Admitting: Internal Medicine

## 2021-11-09 VITALS — BP 170/80 | HR 71 | Ht 61.0 in | Wt 166.0 lb

## 2021-11-09 DIAGNOSIS — I10 Essential (primary) hypertension: Secondary | ICD-10-CM

## 2021-11-09 DIAGNOSIS — E785 Hyperlipidemia, unspecified: Secondary | ICD-10-CM | POA: Diagnosis not present

## 2021-11-09 DIAGNOSIS — I2510 Atherosclerotic heart disease of native coronary artery without angina pectoris: Secondary | ICD-10-CM | POA: Diagnosis not present

## 2021-11-09 DIAGNOSIS — E1169 Type 2 diabetes mellitus with other specified complication: Secondary | ICD-10-CM | POA: Diagnosis not present

## 2021-11-09 NOTE — Patient Instructions (Signed)
Medication Instructions:   Your physician recommends that you continue on your current medications as directed. Please refer to the Current Medication list given to you today.  *If you need a refill on your cardiac medications before your next appointment, please call your pharmacy*   Lab Work:  None ordered  Testing/Procedures:  None ordered   Follow-Up: At [facility], you and your health needs are our priority.  As part of our continuing mission to provide you with exceptional heart care, we have created designated Provider Care Teams.  These Care Teams include your primary Cardiologist (physician) and Advanced Practice Providers (APPs -  Physician Assistants and Nurse Practitioners) who all work together to provide you with the care you need, when you need it.  We recommend signing up for the patient portal called "MyChart".  Sign up information is provided on this After Visit Summary.  MyChart is used to connect with patients for Virtual Visits (Telemedicine).  Patients are able to view lab/test results, encounter notes, upcoming appointments, etc.  Non-urgent messages can be sent to your provider as well.   To learn more about what you can do with MyChart, go to [email or web address].    Your next appointment:   6 month(s)  The format for your next appointment:   In Person  Provider:   You may see Yvonne Kendall, MD or one of the following Advanced Practice Providers on your designated Care Team:   Nicolasa Ducking, NP Eula Listen, PA-C Cadence Fransico Michael, PA-C{   Other Instructions  Check your blood pressure at home and let us know if your BP is consistently greater than 130/80.

## 2021-11-09 NOTE — Progress Notes (Signed)
Follow-up Outpatient Visit Date: 11/09/2021  Primary Care Provider: Olin Hauser, DO [address] [id number]  Chief Complaint: Follow-up coronary artery disease and hypertension  HPI:  Olivia Roth is a 66 y.o. female with history of coronary artery disease status post urgent CABG in the setting of NSTEMI in 08/2018, hyperlipidemia, diabetes mellitus, GERD, and anxiety, who presents for follow-up of coronary artery disease.  I last saw her in September, at which time she was feeling well.  She was frustrated by her inability to lose weight.  Her blood pressure was elevated, though she reported that was atypical for her.  Today, Ms. Wiggs reports that she has continued to feel well, denying chest pain, shortness of breath, palpitations, lightheadedness, and edema.  She is trying to walk more and eat better to help lower her weight and improve her blood pressure and glucose control.  She reports that her blood pressures have been normal at home and believes that her elevated readings today are due to whitecoat hypertension.  She is tolerating her medications well.  --------------------------------------------------------------------------------------------------  Past Medical History:  Diagnosis Date   Anxiety    Asthma    In cold weather (08/21/2018)   CAD (coronary artery disease)    a. 08/2018 NSTEMI/Cath: LM 50d,LAD 80ost/p, 38m70d, LCX 90ost, OM1 50, OM3 70, OM4 80, LPDA small, RCA  99p/d, EF 30-35%; c. 08/2018 CABG x 4: LIMA->LAD, VG->RI, VG->OM, VG->RCA.   Chronic combined systolic (congestive) and diastolic (congestive) heart failure ([facility]    a. 08/2018 Echo: EF 40-45%, mid-apicalanteroseptal and apical HK. Gr2 DD. Mild AI; b. 08/2018 intraop TEE: EF 45-50%, antsept, infsept HK. Mild AI. Mildly dil Ao root. Trace MR.   Family history of adverse reaction to anesthesia    "mother PONV; talked crazy/loud after anesthesia" (08/21/2018)   GERD (gastroesophageal reflux  disease)    High cholesterol    Ischemic cardiomyopathy    a. 08/2018 Echo: EF 40-45%; b. 08/2018 TEE: EF 45-50%.   Seasonal allergies    Tuberculosis ~ 1960   Type II diabetes mellitus ([facility]    UTI (lower urinary tract infection)    "once; before hysterectomy" (08/21/2018)   Past Surgical History:  Procedure Laterality Date   CARDIAC CATHETERIZATION     CORONARY ARTERY BYPASS GRAFT N/A 08/26/2018   Procedure: CORONARY ARTERY BYPASS GRAFTING (CABG) times four using left internal mammary artery and right leg saphenous vein grafts;  Surgeon: VIvin Poot MD;  [facility]  Service: Open Heart Surgery;  Laterality: N/A;   GANGLION CYST EXCISION Left ~ 1960   neck   LEFT HEART CATH AND CORONARY ANGIOGRAPHY N/A 08/21/2018   Procedure: LEFT HEART CATH AND CORONARY ANGIOGRAPHY;  Surgeon: ENelva Bush MD;  [facility];  Service: Cardiovascular;  Laterality: N/A;   TEE WITHOUT CARDIOVERSION N/A 08/26/2018   Procedure: TRANSESOPHAGEAL ECHOCARDIOGRAM (TEE);  Surgeon: VPrescott Gum PCollier Salina MD;  [place] [facility]  Service: Open Heart Surgery;  Laterality: N/A;   TOOTH EXTRACTION     TOTAL ABDOMINAL HYSTERECTOMY  2006    Current Meds  Medication Sig   acetaminophen (TYLENOL) 500 MG tablet Take 2 tablets (1,000 mg total) by mouth every 6 (six) hours as needed.   albuterol (VENTOLIN HFA) 108 (90 Base) MCG/ACT inhaler INHALE 2 PUFFS INTO THE LUNGS EVERY 6 HOURS AS NEEDED FOR WHEEZING OR SHORTNESS OF BREATH   aspirin EC 81 MG tablet Take 1 tablet (81 mg total) by mouth daily.  atorvastatin (LIPITOR) 80 MG tablet TAKE 1 TABLET DAILY   blood glucose meter kit and supplies KIT Dispense based on patient and insurance preference. two times daily as directed. For E11.65   fluticasone (FLONASE) 50 MCG/ACT nasal spray Place 2 sprays into both nostrils daily. Use for 4-6 weeks then stop and use seasonally or as needed.   glucose blood test strip Use as instructed   Lancets  (ONETOUCH ULTRASOFT) lancets TEST TWICE DAILY   metFORMIN (GLUCOPHAGE) 500 MG tablet TAKE 2 TABLETS IN THE MORNING AND 1 TABLET IN THE EVENING WITH MEALS   metoprolol tartrate (LOPRESSOR) 25 MG tablet TAKE 1 TABLET TWICE A DAY    Allergies: Shellfish allergy, Avocado, Advair diskus [fluticasone-salmeterol], Cortisone, Pecan nut (diagnostic), and Salmon [fish allergy]  Social History   Tobacco Use   Smoking status: Never   Smokeless tobacco: Never  Vaping Use   Vaping Use: Never used  Substance Use Topics   Alcohol use: Not Currently   Drug use: Never    Family History  Problem Relation Age of Onset   Cancer Sister    Heart disease Neg Hx     Review of Systems: A 12-system review of systems was performed and was negative except as noted in the HPI.  --------------------------------------------------------------------------------------------------  Physical Exam: BP (!) 170/80 (BP Location: Left Arm, Patient Position: Sitting, Cuff Size: Large)    Pulse 71    Ht '5\' 1"'  (1.549 m)    Wt 166 lb (75.3 kg)    SpO2 98%    BMI 31.37 kg/m  Repeat BP 150/72  General:  NAD. Neck: No JVD or HJR. Lungs: Clear to auscultation bilaterally without wheezes or crackles. Heart: Regular rate and rhythm without murmurs, rubs, or gallops. Abdomen: Soft, nontender, nondistended. Extremities: No lower extremity edema.  EKG: Normal sinus rhythm with poor R wave progression and nonspecific T wave changes.  No significant change from prior tracing on 06/10/2021.  Lab Results  Component Value Date   WBC 6.0 09/28/2021   HGB 12.8 09/28/2021   HCT 39.5 09/28/2021   MCV 92.5 09/28/2021   PLT 231 09/28/2021    Lab Results  Component Value Date   NA 142 09/28/2021   K 5.0 09/28/2021   CL 107 09/28/2021   CO2 28 09/28/2021   BUN 23 09/28/2021   CREATININE 0.77 09/28/2021   GLUCOSE 85 09/28/2021   ALT 13 09/28/2021    Lab Results  Component Value Date   CHOL 133 06/10/2021   HDL 35 (L)  06/10/2021   LDLCALC 63 06/10/2021   TRIG 215 (H) 06/10/2021   CHOLHDL 3.8 06/10/2021    --------------------------------------------------------------------------------------------------  ASSESSMENT AND PLAN: Coronary artery disease without angina: Ms. Hilley continues to do well without evidence of recurrent angina following CABG in 2019.  Continue current medications including aspirin, metoprolol, and atorvastatin for secondary prevention.  Hypertension: Blood pressure remains poorly controlled in the office today, though Ms. [place] reports that her blood pressures are typically normal at home.  She is reluctant to add medications at this time and wishes to continue working on lifestyle modifications and closely monitoring her blood pressure at home.  I have asked her to alert [place] if her blood pressure is consistently above 130/80.  She would benefit from consideration of an ACE inhibitor or ARB in the future if her blood pressure remains elevated, given her history of diabetes mellitus.  For now we will continue metoprolol tartrate 25 mg twice daily.  Hyperlipidemia associated with type  2 diabetes mellitus: LDL well controlled on last check in September.  Triglycerides mildly elevated.  We will continue to work on diet and exercise to help improve triglycerides.  Continue atorvastatin 80 mg daily.  Follow-up: Return to clinic in 6 months.  Nelva Bush, MD 11/09/2021 4:38 PM

## 2021-11-10 ENCOUNTER — Encounter: Payer: Self-pay | Admitting: Internal Medicine

## 2022-01-23 ENCOUNTER — Other Ambulatory Visit: Payer: Self-pay | Admitting: Internal Medicine

## 2022-04-03 ENCOUNTER — Ambulatory Visit: Payer: BC Managed Care – PPO | Admitting: Family Medicine

## 2022-04-24 ENCOUNTER — Other Ambulatory Visit: Payer: Self-pay | Admitting: Internal Medicine

## 2022-05-12 ENCOUNTER — Ambulatory Visit: Payer: BC Managed Care – PPO | Admitting: Physician Assistant

## 2022-06-15 ENCOUNTER — Encounter: Payer: Self-pay | Admitting: Physician Assistant

## 2022-06-15 ENCOUNTER — Other Ambulatory Visit
Admission: RE | Admit: 2022-06-15 | Discharge: 2022-06-15 | Disposition: A | Discharge status: Home / Self Care | Payer: BC Managed Care – PPO | Source: Ambulatory Visit | Attending: Physician Assistant | Admitting: Physician Assistant

## 2022-06-15 ENCOUNTER — Ambulatory Visit: Payer: BC Managed Care – PPO | Attending: Physician Assistant | Admitting: Physician Assistant

## 2022-06-15 VITALS — BP 140/72 | HR 77 | Ht 61.0 in | Wt 171.2 lb

## 2022-06-15 DIAGNOSIS — E785 Hyperlipidemia, unspecified: Secondary | ICD-10-CM | POA: Diagnosis not present

## 2022-06-15 DIAGNOSIS — I10 Essential (primary) hypertension: Secondary | ICD-10-CM

## 2022-06-15 DIAGNOSIS — E1169 Type 2 diabetes mellitus with other specified complication: Secondary | ICD-10-CM | POA: Insufficient documentation

## 2022-06-15 DIAGNOSIS — I2510 Atherosclerotic heart disease of native coronary artery without angina pectoris: Secondary | ICD-10-CM | POA: Insufficient documentation

## 2022-06-15 DIAGNOSIS — I5020 Unspecified systolic (congestive) heart failure: Secondary | ICD-10-CM

## 2022-06-15 DIAGNOSIS — I255 Ischemic cardiomyopathy: Secondary | ICD-10-CM

## 2022-06-15 LAB — COMPREHENSIVE METABOLIC PANEL
ALT: 17 U/L (ref 0–44)
AST: 24 U/L (ref 15–41)
Albumin: 4.4 g/dL (ref 3.5–5.0)
Alkaline Phosphatase: 90 U/L (ref 38–126)
Anion gap: 8 (ref 5–15)
BUN: 30 mg/dL — ABNORMAL HIGH (ref 8–23)
CO2: 25 mmol/L (ref 22–32)
Calcium: 9.4 mg/dL (ref 8.9–10.3)
Chloride: 108 mmol/L (ref 98–111)
Creatinine, Ser: 1.07 mg/dL — ABNORMAL HIGH (ref 0.44–1.00)
GFR, Estimated: 57 mL/min — ABNORMAL LOW (ref >60)
Glucose, Bld: 177 mg/dL — ABNORMAL HIGH (ref 70–99)
Potassium: 5 mmol/L (ref 3.5–5.1)
Sodium: 141 mmol/L (ref 135–145)
Total Bilirubin: 0.9 mg/dL (ref 0.3–1.2)
Total Protein: 7.8 g/dL (ref 6.5–8.1)

## 2022-06-15 LAB — LIPID PANEL
Cholesterol: 139 mg/dL (ref 0–200)
HDL: 37 mg/dL — ABNORMAL LOW (ref >40)
LDL Cholesterol: 54 mg/dL (ref 0–99)
Total CHOL/HDL Ratio: 3.8 RATIO
Triglycerides: 239 mg/dL — ABNORMAL HIGH (ref <150)
VLDL: 48 mg/dL — ABNORMAL HIGH (ref 0–40)

## 2022-06-15 LAB — LDL CHOLESTEROL, DIRECT: Direct LDL: 72 mg/dL (ref 0–99)

## 2022-06-15 NOTE — Progress Notes (Signed)
Cardiology Office Note    Date:  06/15/2022   ID:  Olivia Roth, DOB 1956-02-19, MRN [id number]  PCP:  Olin Hauser, DO  Cardiologist:  Nelva Bush, MD  Electrophysiologist:  None   Chief Complaint: Follow up  History of Present Illness:   Olivia Roth is a 66 y.o. female with history of CAD with NSTEMI status post four-vessel CABG in 08/2018 with LIMA to LAD, SVG to RI, SVG to OM, and SVG to RCA, HFrEF secondary to ICM with subsequent normalization of LV systolic function by echo in 2020, DM2, HTN, HLD, GERD, and anxiety who presents for follow-up of CAD.  She was admitted to the hospital in 08/2018 with an NSTEMI.  Echo showed an EF of 40 to 45%.  LHC showed severe multivessel CAD with an EF of 30 to 35%.  She was transferred to [facility] and underwent four-vessel CABG with relatively uncomplicated postoperative course.  Pre-CABG imaging showed bilateral ICA stenosis of 1 to 39% with normal ABIs bilaterally.  Intraoperative TEE showed an EF of 45 to 50% with hypokinesis of the anteroseptal and inferoseptal wall.  Most recent echo from 07/2019 demonstrated an EF of 50 to 40%, grade 1 diastolic dysfunction, normal RV systolic function and ventricular cavity size, and no significant valvular abnormalities.  She was last seen in the office in 11/2021 and was without symptoms of angina or decompensation.  Her blood pressure was again noted to be elevated, though she felt like this was due to whitecoat hypertension and reported normal blood pressures at home.  She was reluctant to add medications at that time and reported she would continue to work on lifestyle modification.  She is doing well from a cardiac perspective and is without symptoms of angina or decompensation.  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  With this, she has not had much time to take out for herself or her health.  In this setting, she  is frustrated that her weight is up 5 pounds today when compared to her last clinic visit.  Now that she is back on a regular schedule, she is focusing on a heart healthy diet.  She does plan to discuss Ozempic with her PCP at her next visit with them.  Blood pressure at home is typically well controlled.  She indicates she becomes nervous when coming to the cardiologist office.  Blood pressure was well controlled at her last PCP visit.  She is adherent and tolerating cardiac medications without issues.  No falls, dizziness, presyncope, or syncope.  No bleeding concerns.  Overall, she feels like she is doing well from a cardiac perspective.   Labs independently reviewed: 09/2021 - TSH normal, Hgb 12.8, PLT 231, BUN 23, serum creatinine 0.77, potassium 5.0, albumin 4.5, AST/ALT normal, A1c 7.5 06/2021 - TC 133, TG 215, HDL 35, LDL 63  Past Medical History:  Diagnosis Date   Anxiety    Asthma    In cold weather (08/21/2018)   CAD (coronary artery disease)    a. 08/2018 NSTEMI/Cath: LM 50d,LAD 80ost/p, 54m 70d, LCX 90ost, OM1 50, OM3 70, OM4 80, LPDA small, RCA  99p/d, EF 30-35%; c. 08/2018 CABG x 4: LIMA->LAD, VG->RI, VG->OM, VG->RCA.   Chronic combined systolic (congestive) and diastolic (congestive) heart failure ([place])    a. 08/2018 Echo: EF 40-45%, mid-apicalanteroseptal and apical HK. Gr2 DD. Mild AI; b. 08/2018 intraop TEE:  EF 45-50%, antsept, infsept HK. Mild AI. Mildly dil Ao root. Trace MR.   Family history of adverse reaction to anesthesia    "mother PONV; talked crazy/loud after anesthesia" (08/21/2018)   GERD (gastroesophageal reflux disease)    High cholesterol    Ischemic cardiomyopathy    a. 08/2018 Echo: EF 40-45%; b. 08/2018 TEE: EF 45-50%.   Seasonal allergies    Tuberculosis ~ 1960   Type II diabetes mellitus ([place])    UTI (lower urinary tract infection)    "once; before hysterectomy" (08/21/2018)    Past Surgical History:  Procedure Laterality Date   CARDIAC  CATHETERIZATION     CORONARY ARTERY BYPASS GRAFT N/A 08/26/2018   Procedure: CORONARY ARTERY BYPASS GRAFTING (CABG) times four using left internal mammary artery and right leg saphenous vein grafts;  Surgeon: Ivin Poot, MD;  Location: [place];  Service: Open Heart Surgery;  Laterality: N/A;   GANGLION CYST EXCISION Left ~ 1960   neck   LEFT HEART CATH AND CORONARY ANGIOGRAPHY N/A 08/21/2018   Procedure: LEFT HEART CATH AND CORONARY ANGIOGRAPHY;  Surgeon: Nelva Bush, MD;  Location: [place] [facility];  Service: Cardiovascular;  Laterality: N/A;   TEE WITHOUT CARDIOVERSION N/A 08/26/2018   Procedure: TRANSESOPHAGEAL ECHOCARDIOGRAM (TEE);  Surgeon: Prescott Gum, Collier Salina, MD;  Location: [facility];  Service: Open Heart Surgery;  Laterality: N/A;   TOOTH EXTRACTION     TOTAL ABDOMINAL HYSTERECTOMY  2006    Current Medications: Current Meds  Medication Sig   acetaminophen (TYLENOL) 500 MG tablet Take 2 tablets (1,000 mg total) by mouth every 6 (six) hours as needed.   albuterol (VENTOLIN HFA) 108 (90 Base) MCG/ACT inhaler INHALE 2 PUFFS INTO THE LUNGS EVERY 6 HOURS AS NEEDED FOR WHEEZING OR SHORTNESS OF BREATH   aspirin EC 81 MG tablet Take 1 tablet (81 mg total) by mouth daily.   atorvastatin (LIPITOR) 80 MG tablet TAKE 1 TABLET DAILY   blood glucose meter kit and supplies KIT Dispense based on patient and insurance preference. two times daily as directed. For E11.65   fluticasone (FLONASE) 50 MCG/ACT nasal spray Place 2 sprays into both nostrils daily. Use for 4-6 weeks then stop and use seasonally or as needed.   glucose blood test strip Use as instructed   Lancets (ONETOUCH ULTRASOFT) lancets TEST TWICE DAILY   metFORMIN (GLUCOPHAGE) 500 MG tablet TAKE 2 TABLETS IN THE MORNING AND 1 TABLET IN THE EVENING WITH MEALS   metoprolol tartrate (LOPRESSOR) 25 MG tablet TAKE 1 TABLET TWICE A DAY    Allergies:   Shellfish allergy, Avocado, Advair diskus [fluticasone-salmeterol], Cortisone, Pecan  nut (diagnostic), and Salmon [fish allergy]   Social History   Socioeconomic History   Marital status: Married    Spouse name: Not on file   Number of children: 2   Years of education: 18   Highest education level: Not on file  Occupational History   Occupation: Passenger transport manager: [employer]    Comment: Oceanographer  Tobacco Use   Smoking status: Never   Smokeless tobacco: Never  Vaping Use   Vaping Use: Never used  Substance and Sexual Activity   Alcohol use: Not Currently   Drug use: Never   Sexual activity: Not on file  Other Topics Concern   Not on file  Social History Narrative   Not on file   Social Determinants of Health   Financial Resource Strain: Not on file  Food Insecurity: Not on file  Transportation Needs: Not on file  Physical Activity: Not on file  Stress: Not on file  Social Connections: Not on file     Family History:  The patient's family history includes Cancer in her sister. There is no history of Heart disease.  ROS:   12-point review of systems is negative unless otherwise noted in the HPI.   EKGs/Labs/Other Studies Reviewed:    Studies reviewed were summarized above. The additional studies were reviewed today:  Limited echo 08/06/2019: 1. Left ventricular ejection fraction, by visual estimation, is 50 to  55%. The left ventricle has normal function. Normal left ventricular size.  There is no left ventricular hypertrophy.   2. Left ventricular diastolic parameters are consistent with Grade I  diastolic dysfunction (impaired relaxation).   3. Global right ventricle has normal systolic function.The right  ventricular size is normal. Right vetricular wall thickness was not  assessed.   4. Left atrial size was normal.   5. Right atrial size was normal.   6. The mitral valve is normal in structure. No evidence of mitral valve  regurgitation.   7. The tricuspid valve is normal in structure. Tricuspid valve  regurgitation  is not demonstrated.   8. The aortic valve was not well visualized. Aortic valve regurgitation  is mild.   9. The pulmonic valve was not assessed. Pulmonic valve regurgitation not  assessed. __________  Intraoperative TEE 08/26/2018:  Left Atrium: Normal size, no evidence of LAA thrombus with PWD  velocities > 40 mm/s in LAA   Septum: No Patent Foramen Ovale present, no evidence of PFO or ASD on  color flow doppler   Left Ventricle: Normal chamber size and reduced systolic function, LVEF  97-67%, hypokinesis of the anteroseptal and inferoseptal wall.   Aortic valve: Tricuspid valve noted, valve leaflets demonstrate normal  motion with minimal sclerosis or calcification. Mild regurgitation.   Aorta: The aortic root and ascending aorta are mildly dilated.   Mitral valve: Trace regurgitation with a central jet.   Right Atrium: Normal size, PA catheter noted traversing RA   Right ventricle: Normal cavity size, wall thickness and ejection  fraction.   Pulmonic Valve: No insufficiency seen on color doppler.   Aorta: Grade 2 calcification of descending aorta and aortic arch, no  evidence of dissection.   Pericardium: no significant pericardial effusion. __________  Pre-CABG carotid and peripheral arterial ultrasound 08/23/2018: Summary:  Right Carotid: Velocities in the right ICA are consistent with a 1-39%  stenosis.   Left Carotid: Velocities in the left ICA are consistent with a 1-39%  stenosis.  Vertebrals: Bilateral vertebral arteries demonstrate antegrade flow.   Right ABI: Resting right ankle-brachial index is within normal range. No  evidence of significant right lower extremity arterial disease.  Left ABI: Resting left ankle-brachial index is within normal range. No  evidence of significant left lower extremity arterial disease.  __________  2D echo 08/22/2018: - Left ventricle: Systolic function was mildly to moderately    reduced. The estimated ejection  fraction was in the range of 40%    to 45%. Moderate hypokinesis of the mid-apicalanteroseptal and    apical myocardium. Features are consistent with a pseudonormal    left ventricular filling pattern, with concomitant abnormal    relaxation and increased filling pressure (grade 2 diastolic    dysfunction).  - Aortic valve: There was mild regurgitation. __________  LHC 08/21/2018: Conclusions: Severe three-vessel coronary artery disease, including 50% distal LMCA, 80% ostial/proximal LAD, and 90% ostial LCx, and  99% mid RCA stenoses. Moderately to severely reduced left ventricular systolic function (LVEF 65-46%) with global hypokinesis and mid/apical akinesis. Moderately elevated left ventricular filling pressure.   Recommendations: Transfer to [facility] for surgical consultation for CABG. Gentle diuresis. Restart heparin infusion 2 hours after TR band deflation. Aggressive secondary prevention and medical therapy.  Will continue carvedilol 3.125 mg BID and increase atorvastatin to 80 mg daily.   Recommend uninterrupted dual antiplatelet therapy with aspirin $RemoveBefo'81mg'LKKoTnaWecE$  daily and clopidogrel $RemoveBeforeD'75mg'fxPLbHQIuGKTUh$  daily for a minimum of 12 months (ACS - Class I recommendation).  I will defer starting clopidogrel pending cardiac surgery consultation.    EKG:  EKG is ordered today.  The EKG ordered today demonstrates NSR, 77 bpm, prior inferior infarct, poor R wave progression along the precordial leads, no acute ST-T changes, consistent with prior tracing  Recent Labs: 09/28/2021: ALT 13; BUN 23; Creat 0.77; Hemoglobin 12.8; Platelets 231; Potassium 5.0; Sodium 142; TSH 1.49  Recent Lipid Panel    Component Value Date/Time   CHOL 133 06/10/2021 0924   TRIG 215 (H) 06/10/2021 0924   HDL 35 (L) 06/10/2021 0924   CHOLHDL 3.8 06/10/2021 0924   CHOLHDL 3.1 10/07/2019 0911   VLDL 28 10/07/2019 0911   LDLCALC 63 06/10/2021 0924   LDLCALC  07/04/2017 0902     Comment:     . LDL cholesterol not calculated.  Triglyceride levels greater than 400 mg/dL invalidate calculated LDL results. . Reference range: <100 . Desirable range <100 mg/dL for primary prevention;   <70 mg/dL for patients with CHD or diabetic patients  with > or = 2 CHD risk factors. Marland Kitchen LDL-C is now calculated using the Martin-Hopkins  calculation, which is a validated novel method providing  better accuracy than the Friedewald equation in the  estimation of LDL-C.  Cresenciano Genre et al. Annamaria Helling. 5035;465(68): 2061-2068  (http://education.[employer].com/faq/FAQ164)     PHYSICAL EXAM:    VS:  BP (!) 140/72   Pulse 77   Ht $R'5\' 1"'Wm$  (1.549 m)   Wt 171 lb 3.2 oz (77.7 kg)   SpO2 97%   BMI 32.35 kg/m   BMI: Body mass index is 32.35 kg/m.  Physical Exam Vitals reviewed.  Constitutional:      Appearance: She is well-developed.  HENT:     Head: Normocephalic and atraumatic.  Eyes:     General:        Right eye: No discharge.        Left eye: No discharge.  Neck:     Vascular: No JVD.  Cardiovascular:     Rate and Rhythm: Normal rate and regular rhythm.     Pulses:          Posterior tibial pulses are 2+ on the right side and 2+ on the left side.     Heart sounds: Normal heart sounds, S1 normal and S2 normal. Heart sounds not distant. No midsystolic click and no opening snap. No murmur heard.    No friction rub.  Pulmonary:     Effort: Pulmonary effort is normal. No respiratory distress.     Breath sounds: Normal breath sounds. No decreased breath sounds, wheezing or rales.  Chest:     Chest wall: No tenderness.  Abdominal:     General: There is no distension.  Musculoskeletal:     Cervical back: Normal range of motion.     Right lower leg: No edema.     Left lower leg: No edema.  Skin:    General: Skin  is warm and dry.     Nails: There is no clubbing.  Neurological:     Mental Status: She is alert and oriented to person, place, and time.  Psychiatric:        Speech: Speech normal.        Behavior: Behavior  normal.        Thought Content: Thought content normal.        Judgment: Judgment normal.     Wt Readings from Last 3 Encounters:  06/15/22 171 lb 3.2 oz (77.7 kg)  11/09/21 166 lb (75.3 kg)  09/28/21 164 lb (74.4 kg)     ASSESSMENT & PLAN:   CAD status post CABG without angina: She is doing well and is without symptoms of angina or decompensation.  Continue aggressive risk factor modification and secondary prevention including aspirin, metoprolol, and atorvastatin.  No indication for further ischemic testing at this time.  HFrEF secondary to ICM: She has subsequently had normalization of LV systolic function by echo in 2020.  She appears euvolemic and well compensated with NYHA class I symptoms.  She remains on metoprolol and is not requiring a standing diuretic.  HTN: Blood pressure is elevated at triage at 162/72.  Repeat blood pressure 140/72.  She reports blood pressure at home is typically well controlled.  BP at her PCPs office was also well controlled.  We have agreed to defer medication escalation or transition of metoprolol to carvedilol at this time with plans for her to focus on a heart healthy diet and pursue further weight loss.  If her blood pressure continues to run on the high side, could consider transitioning metoprolol to carvedilol in an effort to minimize pills.  HLD: LDL 63 with a triglyceride of 215 in 06/2021.  She remains on atorvastatin 80 mg.  Check CMP, lipid panel, and direct LDL.    Disposition: F/u with Dr. Saunders Revel or an APP in 6 months.   Medication Adjustments/Labs and Tests Ordered: Current medicines are reviewed at length with the patient today.  Concerns regarding medicines are outlined above. Medication changes, Labs and Tests ordered today are summarized above and listed in the Patient Instructions accessible in Encounters.   Signed, Christell Faith, PA-C 06/15/2022 3:52 PM     [address] [place], [place]  [id number] [phone]

## 2022-06-15 NOTE — Patient Instructions (Signed)
Medication Instructions:  Your physician recommends that you continue on your current medications as directed. Please refer to the Current Medication list given to you today.  *If you need a refill on your cardiac medications before your next appointment, please call your pharmacy*   Lab Work: CMET, Lipid, Direct LDL over at the [facility] mall entrance. Check in at registration.   If you have labs (blood work) drawn today and your tests are completely normal, you will receive your results only by: MyChart Message (if you have MyChart) OR A paper copy in the mail If you have any lab test that is abnormal or we need to change your treatment, we will call you to review the results.   Testing/Procedures: None   Follow-Up: At [facility], you and your health needs are our priority.  As part of our continuing mission to provide you with exceptional heart care, we have created designated Provider Care Teams.  These Care Teams include your primary Cardiologist (physician) and Advanced Practice Providers (APPs -  Physician Assistants and Nurse Practitioners) who all work together to provide you with the care you need, when you need it.   Your next appointment:   6 month(s)  The format for your next appointment:   In Person  Provider:   Yvonne Kendall, MD or Eula Listen, PA-C        Important Information About Sugar

## 2022-06-19 ENCOUNTER — Other Ambulatory Visit: Payer: Self-pay | Admitting: *Deleted

## 2022-06-19 DIAGNOSIS — E785 Hyperlipidemia, unspecified: Secondary | ICD-10-CM

## 2022-06-19 DIAGNOSIS — E1169 Type 2 diabetes mellitus with other specified complication: Secondary | ICD-10-CM

## 2022-06-19 DIAGNOSIS — I2510 Atherosclerotic heart disease of native coronary artery without angina pectoris: Secondary | ICD-10-CM

## 2022-07-24 ENCOUNTER — Other Ambulatory Visit: Payer: Self-pay | Admitting: Internal Medicine

## 2022-08-16 ENCOUNTER — Other Ambulatory Visit: Payer: Self-pay | Admitting: Family Medicine

## 2022-08-16 DIAGNOSIS — E1159 Type 2 diabetes mellitus with other circulatory complications: Secondary | ICD-10-CM

## 2022-08-16 NOTE — Telephone Encounter (Signed)
Requested medication (s) are due for refill today: yes  Requested medication (s) are on the active medication list: yes  Last refill:  08/22/21 #270 with 3 RF  Future visit scheduled: no, last visit 09/28/21, canceled 04/03/22, no upcoming appt.  Notes to clinic:  Failed protocol due to no valid visit within 6  months, canceled in June, no upcoming appt,  please assess. 6      Requested Prescriptions  Pending Prescriptions Disp Refills   metFORMIN (GLUCOPHAGE) 500 MG tablet [Pharmacy Med Name: METFORMIN HCL TABS 500MG] 270 tablet 3    Sig: TAKE 2 TABLETS IN THE MORNING AND 1 TABLET IN THE EVENING WITH MEALS     Endocrinology:  Diabetes - Biguanides Failed - 08/16/2022 12:55 AM      Failed - Cr in normal range and within 360 days    Creat  Date Value Ref Range Status  09/28/2021 0.77 0.50 - 1.05 mg/dL Final   Creatinine, Ser  Date Value Ref Range Status  06/15/2022 1.07 (H) 0.44 - 1.00 mg/dL Final   Creatinine, Urine  Date Value Ref Range Status  09/28/2021 82 20 - 275 mg/dL Final         Failed - HBA1C is between 0 and 7.9 and within 180 days    Hemoglobin A1C  Date Value Ref Range Status  09/28/2021 7.5 (A) 4.0 - 5.6 % Final   Hgb A1c MFr Bld  Date Value Ref Range Status  03/12/2019 6.4 (H) <5.7 % of total Hgb Final    Comment:    For someone without known diabetes, a hemoglobin  A1c value between 5.7% and 6.4% is consistent with prediabetes and should be confirmed with a  follow-up test. . For someone with known diabetes, a value <7% indicates that their diabetes is well controlled. A1c targets should be individualized based on duration of diabetes, age, comorbid conditions, and other considerations. . This assay result is consistent with an increased risk of diabetes. . Currently, no consensus exists regarding use of hemoglobin A1c for diagnosis of diabetes for children. .          Failed - eGFR in normal range and within 360 days    GFR, Est African  American  Date Value Ref Range Status  03/05/2019 78 > OR = 60 mL/min/1.89m Final   GFR calc Af Amer  Date Value Ref Range Status  08/18/2020 75 >59 mL/min/1.73 Final    Comment:    **In accordance with recommendations from the NKF-ASN Task force,**   [facility] is in the process of updating its eGFR calculation to the   2021 CKD-EPI creatinine equation that estimates kidney function   without a race variable.    GFR, Est Non African American  Date Value Ref Range Status  03/05/2019 68 > OR = 60 mL/min/1.722mFinal   GFR, Estimated  Date Value Ref Range Status  06/15/2022 57 (L) >60 mL/min Final    Comment:    (NOTE) Calculated using the CKD-EPI Creatinine Equation (2021)    eGFR  Date Value Ref Range Status  09/28/2021 85 > OR = 60 mL/min/1.7336minal    Comment:    The eGFR is based on the CKD-EPI 2021 equation. To calculate  the new eGFR from a previous Creatinine or Cystatin C result, go to https://www.kidney.org/professionals/ kdoqi/gfr%5Fcalculator          Failed - B12 Level in normal range and within 720 days    No results found for: "VITAMINB12"  Failed - Valid encounter within last 6 months    Recent Outpatient Visits           10 months ago Annual physical exam   [facility] Olin Hauser, DO   1 year ago Acute non-recurrent maxillary sinusitis   [facility], Devonne Doughty, DO   1 year ago Type 2 diabetes mellitus with vascular disease ([place])   [facility] Olin Hauser, DO   2 years ago [facility], Lupita Raider, FNP   2 years ago Chronic bursitis of left shoulder   [facility], Devonne Doughty, DO              Passed - CBC within normal limits and completed in the last 12 months    WBC  Date Value Ref Range Status  09/28/2021 6.0 3.8 - 10.8 Thousand/uL Final   RBC  Date Value Ref Range Status  09/28/2021  4.27 3.80 - 5.10 Million/uL Final   Hemoglobin  Date Value Ref Range Status  09/28/2021 12.8 11.7 - 15.5 g/dL Final  08/18/2020 12.7 11.1 - 15.9 g/dL Final   Total hemoglobin  Date Value Ref Range Status  08/29/2018 9.2 (L) 12.0 - 16.0 g/dL Final   HCT  Date Value Ref Range Status  09/28/2021 39.5 35.0 - 45.0 % Final   Hematocrit  Date Value Ref Range Status  08/18/2020 39.3 34.0 - 46.6 % Final   MCHC  Date Value Ref Range Status  09/28/2021 32.4 32.0 - 36.0 g/dL Final   [facility]  Date Value Ref Range Status  09/28/2021 30.0 27.0 - 33.0 pg Final   MCV  Date Value Ref Range Status  09/28/2021 92.5 80.0 - 100.0 fL Final  08/18/2020 93 79 - 97 fL Final   No results found for: "PLTCOUNTKUC", "LABPLAT", "POCPLA" RDW  Date Value Ref Range Status  09/28/2021 13.1 11.0 - 15.0 % Final  08/18/2020 12.7 11.7 - 15.4 % Final

## 2022-08-17 ENCOUNTER — Telehealth: Payer: Self-pay | Admitting: Internal Medicine

## 2022-08-17 NOTE — Telephone Encounter (Signed)
   Primary Cardiologist: Yvonne Kendall, MD  Chart reviewed as part of pre-operative protocol coverage. Simple dental extractions are considered low risk procedures per guidelines and generally do not require any specific cardiac clearance. It is also generally accepted that for simple extractions and dental cleanings, there is no need to interrupt blood thinner therapy.   SBE prophylaxis is not required for the patient.  I will route this recommendation to the requesting party via Epic fax function and remove from pre-op pool.  Please call with questions.  Levi Aland, NP-C  08/17/2022, 3:06 PM [address], [address] Office [phone] Fax [phone]

## 2022-08-17 NOTE — Telephone Encounter (Signed)
   Pre-operative Risk Assessment    Patient Name: Olivia Roth  DOB: [birth year] MRN: [id number]     Request for Surgical Clearance    Procedure:  Dental Extraction - Amount of Teeth to be Pulled:  1  Date of Surgery:  Clearance 08/17/22                                 Surgeon:  Dr. Rosezetta Schlatter Surgeon's Group or Practice Name:  [facility]  Phone number:  [phone] Fax number:      Type of Clearance Requested:   - Medical  - Pharmacy:  Hold Aspirin     Type of Anesthesia:  Local    Additional requests/questions:   1  Signed, Filomena Jungling   08/17/2022, 3:00 PM

## 2023-01-03 ENCOUNTER — Encounter: Payer: Self-pay | Admitting: Family Medicine

## 2023-01-03 ENCOUNTER — Ambulatory Visit: Payer: BC Managed Care – PPO | Admitting: Family Medicine

## 2023-01-03 ENCOUNTER — Other Ambulatory Visit: Payer: Self-pay | Admitting: Family Medicine

## 2023-01-03 VITALS — BP 126/72 | HR 74 | Ht 61.0 in | Wt 164.8 lb

## 2023-01-03 DIAGNOSIS — E1159 Type 2 diabetes mellitus with other circulatory complications: Secondary | ICD-10-CM

## 2023-01-03 DIAGNOSIS — J0110 Acute frontal sinusitis, unspecified: Secondary | ICD-10-CM

## 2023-01-03 DIAGNOSIS — E1169 Type 2 diabetes mellitus with other specified complication: Secondary | ICD-10-CM

## 2023-01-03 DIAGNOSIS — Z0000 Encounter for general adult medical examination without abnormal findings: Secondary | ICD-10-CM

## 2023-01-03 DIAGNOSIS — J4520 Mild intermittent asthma, uncomplicated: Secondary | ICD-10-CM

## 2023-01-03 DIAGNOSIS — E781 Pure hyperglyceridemia: Secondary | ICD-10-CM

## 2023-01-03 DIAGNOSIS — J9801 Acute bronchospasm: Secondary | ICD-10-CM | POA: Diagnosis not present

## 2023-01-03 MED ORDER — AZITHROMYCIN 250 MG PO TABS: ORAL_TABLET | ORAL | 0 refills | Status: DC

## 2023-01-03 MED ORDER — FLUTICASONE PROPIONATE 50 MCG/ACT NA SUSP: 2.0000 | Freq: Every day | NASAL | 3 refills | Status: AC

## 2023-01-03 MED ORDER — ALBUTEROL SULFATE HFA 108 (90 BASE) MCG/ACT IN AERS: 2.0000 | INHALATION_SPRAY | Freq: Four times a day (QID) | RESPIRATORY_TRACT | 2 refills | Status: AC | PRN

## 2023-01-03 NOTE — Progress Notes (Signed)
Subjective:    Patient ID: Olivia Roth, female    DOB: [birth year], 67 y.o.   MRN: [id number]  Olivia Roth is a 67 y.o. female presenting on 01/03/2023 for Sinusitis   HPI  Sinusitis Reports now 3 weeks with persistent sinusitis with sinus pressure congestion drainage and cough. Admits thicker green sputum drainage. History with husband sick with sinusitis 12/08/22 treated with Augmentin, he got sick again and so did she, and also exposed to children at school may have acquired infection She had augmentin before and had diarrhea side effect Denies any shortness of breath or wheezing, fevers chills, nausea vomiting      01/03/2023    8:45 AM 09/28/2021    6:22 PM 08/18/2020    1:20 PM  Depression screen PHQ 2/9  Decreased Interest 0 0 0  Down, Depressed, Hopeless 0 0 0  PHQ - 2 Score 0 0 0    Social History   Tobacco Use   Smoking status: Never   Smokeless tobacco: Never  Vaping Use   Vaping Use: Never used  Substance Use Topics   Alcohol use: Not Currently   Drug use: Never    Review of Systems Per HPI unless specifically indicated above     Objective:    BP 126/72   Pulse 74   Ht 5\' 1"  (1.549 m)   Wt 164 lb 12.8 oz (74.8 kg)   SpO2 99%   BMI 31.14 kg/m   Wt Readings from Last 3 Encounters:  01/03/23 164 lb 12.8 oz (74.8 kg)  06/15/22 171 lb 3.2 oz (77.7 kg)  11/09/21 166 lb (75.3 kg)    Physical Exam   Results for orders placed or performed during the hospital encounter of 06/15/22  Lipid panel  Result Value Ref Range   Cholesterol 139 0 - 200 mg/dL   Triglycerides 239 (H) <150 mg/dL   HDL 37 (L) >40 mg/dL   Total CHOL/HDL Ratio 3.8 RATIO   VLDL 48 (H) 0 - 40 mg/dL   LDL Cholesterol 54 0 - 99 mg/dL  Direct LDL  Result Value Ref Range   Direct LDL 72 0 - 99 mg/dL  Comp Met (CMET)  Result Value Ref Range   Sodium 141 135 - 145 mmol/L   Potassium 5.0 3.5 - 5.1 mmol/L   Chloride 108 98 - 111 mmol/L   CO2 25 22 - 32 mmol/L   Glucose, Bld 177  (H) 70 - 99 mg/dL   BUN 30 (H) 8 - 23 mg/dL   Creatinine, Ser 1.07 (H) 0.44 - 1.00 mg/dL   Calcium 9.4 8.9 - 10.3 mg/dL   Total Protein 7.8 6.5 - 8.1 g/dL   Albumin 4.4 3.5 - 5.0 g/dL   AST 24 15 - 41 U/L   ALT 17 0 - 44 U/L   Alkaline Phosphatase 90 38 - 126 U/L   Total Bilirubin 0.9 0.3 - 1.2 mg/dL   GFR, Estimated 57 (L) >60 mL/min   Anion gap 8 5 - 15      Assessment & Plan:   Problem List Items Addressed This Visit     Asthma   Relevant Medications   albuterol (VENTOLIN HFA) 108 (90 Base) MCG/ACT inhaler   Other Visit Diagnoses     Acute non-recurrent frontal sinusitis    -  Primary   Relevant Medications   azithromycin (ZITHROMAX Z-PAK) 250 MG tablet   fluticasone (FLONASE) 50 MCG/ACT nasal spray   Cough due to bronchospasm  Sinusitis (Bacterial Infection) - this most likely started as an Upper Respiratory Virus that has settled into an infection. Allergies can also cause this. - Start Azithromycin Z pak (antibiotic) 2 tabs day 1, then 1 tab x 4 days, complete entire course even if improved - Start allergy anti histamine Loratadine (Claritin) OR Zyrtec (Cetirizine) 10mg  daily - START Flonase 2 sprays in each nostril daily for next 4-6 weeks, then you may stop and use seasonally or as needed - Recommend may try using Nasal Saline spray multiple times a day to help flush out congestion and clear sinuses - Improve hydration by drinking plenty of clear fluids (water, gatorade) to reduce secretions and thin congestion - Congestion draining down throat can cause irritation. May try warm herbal tea with honey, cough drops - Can take Tylenol or Ibuprofen as needed for fevers  Renew Albuterol inhaler  Can consider OTC cough cold meds only as needed - mucinx if congestion too thick and still coming out green OR can consider sudafed if too much sinus pressure, prefer < 1 week max on either of these  Meds ordered this encounter  Medications   azithromycin (ZITHROMAX  Z-PAK) 250 MG tablet    Sig: Take 2 tabs (500mg  total) on Day 1. Take 1 tab (250mg ) daily for next 4 days.    Dispense:  6 tablet    Refill:  0   fluticasone (FLONASE) 50 MCG/ACT nasal spray    Sig: Place 2 sprays into both nostrils daily. Use for 4-6 weeks then stop and use seasonally or as needed.    Dispense:  16 g    Refill:  3   albuterol (VENTOLIN HFA) 108 (90 Base) MCG/ACT inhaler    Sig: Inhale 2 puffs into the lungs every 6 (six) hours as needed for wheezing or shortness of breath.    Dispense:  8.5 g    Refill:  2     Follow up plan: Return in about 6 months (around 07/06/2023) for 6 month fasting lab only then 1 week later Annual Physical.  Future labs ordered for 07/09/23   Nobie Putnam, [facility] 01/03/2023, 8:14 AM

## 2023-01-03 NOTE — Patient Instructions (Addendum)
Thank you for coming to the office today.   1. It sounds like you have a Sinusitis (Bacterial Infection) - this most likely started as an Upper Respiratory Virus that has settled into an infection. Allergies can also cause this. - Start Azithromycin Z pak (antibiotic) 2 tabs day 1, then 1 tab x 4 days, complete entire course even if improved - Start allergy anti histamine Loratadine (Claritin) OR Zyrtec (Cetirizine) 10mg  daily - START Flonase 2 sprays in each nostril daily for next 4-6 weeks, then you may stop and use seasonally or as needed - Recommend may try using Nasal Saline spray multiple times a day to help flush out congestion and clear sinuses - Improve hydration by drinking plenty of clear fluids (water, gatorade) to reduce secretions and thin congestion - Congestion draining down throat can cause irritation. May try warm herbal tea with honey, cough drops - Can take Tylenol or Ibuprofen as needed for fevers  Renew Albuterol inhaler  Can consider OTC cough cold meds only as needed - mucinx if congestion too thick and still coming out green OR can consider sudafed if too much sinus pressure, prefer < 1 week max on either of these  DUE for FASTING BLOOD WORK (no food or drink after midnight before the lab appointment, only water or coffee without cream/sugar on the morning of)  SCHEDULE "Lab Only" visit in the morning at the clinic for lab draw in 6 MONTHS   - Make sure Lab Only appointment is at about 1 week before your next appointment, so that results will be available  For Lab Results, once available within 2-3 days of blood draw, you can can log in to MyChart online to view your results and a brief explanation. Also, we can discuss results at next follow-up visit.    Please schedule a Follow-up Appointment to: Return in about 6 months (around 07/06/2023) for 6 month fasting lab only then 1 week later Annual Physical.  If you have any other questions or concerns, please feel  free to call the office or send a message through [place]. You may also schedule an earlier appointment if necessary.  Additionally, you may be receiving a survey about your experience at our office within a few days to 1 week by e-mail or mail. We value your feedback.  Nobie Putnam, DO [place]

## 2023-01-22 ENCOUNTER — Other Ambulatory Visit: Payer: Self-pay | Admitting: Internal Medicine

## 2023-01-22 NOTE — Telephone Encounter (Signed)
Please schedule 6 month F/U appt for further refills. Thank you! 

## 2023-02-13 NOTE — Progress Notes (Unsigned)
Cardiology Office Note    Date:  02/14/2023   ID:  Olivia Roth, Olivia Roth 08-21-1956, MRN [id number]  PCP:  Smitty Cords, DO  Cardiologist:  Yvonne Kendall, MD  Electrophysiologist:  None   Chief Complaint: Follow-up  History of Present Illness:   Olivia Roth is a 67 y.o. female with history of CAD with NSTEMI status post four-vessel CABG in 08/2018 with LIMA to LAD, SVG to RI, SVG to OM, and SVG to RCA, HFrEF secondary to ICM with subsequent normalization of LV systolic function by echo in 2020, DM2, HTN, HLD, GERD, and anxiety who presents for follow-up of CAD.   She was admitted to the hospital in 08/2018 with an NSTEMI.  Echo showed an EF of 40 to 45%.  LHC showed severe multivessel CAD with an EF of 30 to 35%.  She was transferred to [facility] and underwent four-vessel CABG with relatively uncomplicated postoperative course.  Pre-CABG imaging showed bilateral ICA stenosis of 1 to 39% with normal ABIs bilaterally.  Intraoperative TEE showed an EF of 45 to 50% with hypokinesis of the anteroseptal and inferoseptal wall.  Most recent echo from 07/2019 demonstrated an EF of 50 to 55%, grade 1 diastolic dysfunction, normal RV systolic function and ventricular cavity size, and no significant valvular abnormalities.   She was last seen in the office in 06/2022 and was without symptoms of angina or cardiac decompensation.  She has been very busy with work, opening up a second school.  Her blood pressure was mildly elevated in the office at 140/72, though this was typically well-controlled.  She comes in doing very well from a cardiac perspective and is without symptoms of angina or cardiac decompensation.  No palpitations, dyspnea, dizziness, presyncope, or syncope.  No falls, lower extremity swelling, orthopnea.  She is adherent and tolerating all medications without issues.  She remains very active at baseline, working at 2 separate schools, climbing multiple flights of stairs multiple  times per day without cardiac limitation.  Her weight is down 4 pounds when compared to her last visit in September.  She focuses on eating a heart healthy diet.  Overall, she feels very well and does not have any acute cardiac concerns at this time.   Labs independently reviewed: 06/2022 - potassium 5.0, BUN 30, serum creatinine 1.07, albumin 4.4, AST/ALT normal, TC 139, TG 239, HDL 37, LDL 54, direct LDL 72 09/2021 - TSH normal, Hgb 12.8, PLT 231  Past Medical History:  Diagnosis Date   Anxiety    Asthma    In cold weather (08/21/2018)   CAD (coronary artery disease)    a. 08/2018 NSTEMI/Cath: LM 50d,LAD 80ost/p, 71m 70d, LCX 90ost, OM1 50, OM3 70, OM4 80, LPDA small, RCA  99p/d, EF 30-35%; c. 08/2018 CABG x 4: LIMA->LAD, VG->RI, VG->OM, VG->RCA.   Chronic combined systolic (congestive) and diastolic (congestive) heart failure (HCC)    a. 08/2018 Echo: EF 40-45%, mid-apicalanteroseptal and apical HK. Gr2 DD. Mild AI; b. 08/2018 intraop TEE: EF 45-50%, antsept, infsept HK. Mild AI. Mildly dil Ao root. Trace MR.   Family history of adverse reaction to anesthesia    "mother PONV; talked crazy/loud after anesthesia" (08/21/2018)   GERD (gastroesophageal reflux disease)    High cholesterol    Ischemic cardiomyopathy    a. 08/2018 Echo: EF 40-45%; b. 08/2018 TEE: EF 45-50%.   Seasonal allergies    Tuberculosis ~ 1960   Type II diabetes mellitus (HCC)    UTI (lower  urinary tract infection)    "once; before hysterectomy" (08/21/2018)    Past Surgical History:  Procedure Laterality Date   CARDIAC CATHETERIZATION     CORONARY ARTERY BYPASS GRAFT N/A 08/26/2018   Procedure: CORONARY ARTERY BYPASS GRAFTING (CABG) times four using left internal mammary artery and right leg saphenous vein grafts;  Surgeon: Kerin Perna, MD;  Location: [facility] OR;  Service: Open Heart Surgery;  Laterality: N/A;   GANGLION CYST EXCISION Left ~ 1960   neck   LEFT HEART CATH AND CORONARY ANGIOGRAPHY N/A 08/21/2018    Procedure: LEFT HEART CATH AND CORONARY ANGIOGRAPHY;  Surgeon: Yvonne Kendall, MD;  Location: [facility] INVASIVE CV LAB;  Service: Cardiovascular;  Laterality: N/A;   TEE WITHOUT CARDIOVERSION N/A 08/26/2018   Procedure: TRANSESOPHAGEAL ECHOCARDIOGRAM (TEE);  Surgeon: Donata Clay, Theron Arista, MD;  Location: [facility] OR;  Service: Open Heart Surgery;  Laterality: N/A;   TOOTH EXTRACTION     TOTAL ABDOMINAL HYSTERECTOMY  2006    Current Medications: Current Meds  Medication Sig   acetaminophen (TYLENOL) 500 MG tablet Take 2 tablets (1,000 mg total) by mouth every 6 (six) hours as needed.   albuterol (VENTOLIN HFA) 108 (90 Base) MCG/ACT inhaler Inhale 2 puffs into the lungs every 6 (six) hours as needed for wheezing or shortness of breath.   aspirin EC 81 MG tablet Take 1 tablet (81 mg total) by mouth daily.   atorvastatin (LIPITOR) 80 MG tablet TAKE 1 TABLET DAILY   blood glucose meter kit and supplies KIT Dispense based on patient and insurance preference. two times daily as directed. For E11.65   fluticasone (FLONASE) 50 MCG/ACT nasal spray Place 2 sprays into both nostrils daily. Use for 4-6 weeks then stop and use seasonally or as needed.   glucose blood test strip Use as instructed   Lancets (ONETOUCH ULTRASOFT) lancets TEST TWICE DAILY   metFORMIN (GLUCOPHAGE) 500 MG tablet TAKE 2 TABLETS IN THE MORNING AND 1 TABLET IN THE EVENING WITH MEALS   metoprolol tartrate (LOPRESSOR) 25 MG tablet TAKE 1 TABLET TWICE A DAY    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[employer]    Comment: Clinical cytogeneticist  Tobacco  Use   Smoking status: Never   Smokeless tobacco: Never  Vaping Use   Vaping Use: Never used  Substance and Sexual Activity   Alcohol use: Not Currently   Drug use: Never   Sexual activity: Not on file  Other Topics Concern   Not on file  Social History Narrative   Not on file   Social Determinants of Health   Financial Resource Strain: Not on file  Food Insecurity: Not on file  Transportation Needs: Not on file  Physical Activity: Not on file  Stress: Not on file  Social Connections: Not on file     Family History:  The patient's family history includes Cancer in her sister. There is no history of Heart disease.  ROS:   12-point review of systems is negative unless otherwise noted in the HPI.   EKGs/Labs/Other Studies Reviewed:    Studies reviewed were summarized above. The additional studies were  reviewed today:  Limited echo 08/06/2019: 1. Left ventricular ejection fraction, by visual estimation, is 50 to  55%. The left ventricle has normal function. Normal left ventricular size.  There is no left ventricular hypertrophy.   2. Left ventricular diastolic parameters are consistent with Grade I  diastolic dysfunction (impaired relaxation).   3. Global right ventricle has normal systolic function.The right  ventricular size is normal. Right vetricular wall thickness was not  assessed.   4. Left atrial size was normal.   5. Right atrial size was normal.   6. The mitral valve is normal in structure. No evidence of mitral valve  regurgitation.   7. The tricuspid valve is normal in structure. Tricuspid valve  regurgitation is not demonstrated.   8. The aortic valve was not well visualized. Aortic valve regurgitation  is mild.   9. The pulmonic valve was not assessed. Pulmonic valve regurgitation not  assessed. __________   Intraoperative TEE 08/26/2018:  Left Atrium: Normal size, no evidence of LAA thrombus with PWD  velocities > 40 mm/s in LAA   Septum: No  Patent Foramen Ovale present, no evidence of PFO or ASD on  color flow doppler   Left Ventricle: Normal chamber size and reduced systolic function, LVEF  45-50%, hypokinesis of the anteroseptal and inferoseptal wall.   Aortic valve: Tricuspid valve noted, valve leaflets demonstrate normal  motion with minimal sclerosis or calcification. Mild regurgitation.   Aorta: The aortic root and ascending aorta are mildly dilated.   Mitral valve: Trace regurgitation with a central jet.   Right Atrium: Normal size, PA catheter noted traversing RA   Right ventricle: Normal cavity size, wall thickness and ejection  fraction.   Pulmonic Valve: No insufficiency seen on color doppler.   Aorta: Grade 2 calcification of descending aorta and aortic arch, no  evidence of dissection.   Pericardium: no significant pericardial effusion. __________   Pre-CABG carotid and peripheral arterial ultrasound 08/23/2018: Summary:  Right Carotid: Velocities in the right ICA are consistent with a 1-39%  stenosis.   Left Carotid: Velocities in the left ICA are consistent with a 1-39%  stenosis.  Vertebrals: Bilateral vertebral arteries demonstrate antegrade flow.   Right ABI: Resting right ankle-brachial index is within normal range. No  evidence of significant right lower extremity arterial disease.  Left ABI: Resting left ankle-brachial index is within normal range. No  evidence of significant left lower extremity arterial disease.  __________   2D echo 08/22/2018: - Left ventricle: Systolic function was mildly to moderately    reduced. The estimated ejection fraction was in the range of 40%    to 45%. Moderate hypokinesis of the mid-apicalanteroseptal and    apical myocardium. Features are consistent with a pseudonormal    left ventricular filling pattern, with concomitant abnormal    relaxation and increased filling pressure (grade 2 diastolic    dysfunction).  - Aortic valve: There was mild  regurgitation. __________   LHC 08/21/2018: Conclusions: Severe three-vessel coronary artery disease, including 50% distal LMCA, 80% ostial/proximal LAD, and 90% ostial LCx, and 99% mid RCA stenoses. Moderately to severely reduced left ventricular systolic function (LVEF 30-35%) with global hypokinesis and mid/apical akinesis. Moderately elevated left ventricular filling pressure.   Recommendations: Transfer to [facility] for surgical consultation for CABG. Gentle diuresis. Restart heparin infusion 2 hours after TR band deflation. Aggressive secondary prevention and medical therapy.  Will continue carvedilol 3.125 mg BID and increase atorvastatin to 80 mg daily.   Recommend uninterrupted dual  antiplatelet therapy with aspirin 81mg  daily and clopidogrel 75mg  daily for a minimum of 12 months (ACS - Class I recommendation).  I will defer starting clopidogrel pending cardiac surgery consultation.   EKG:  EKG is ordered today.  The EKG ordered today demonstrates NSR, 75 bpm, prior inferior infarct, poor R wave progression along the precordial leads, no acute ST-T changes, consistent with prior tracing  Recent Labs: 06/15/2022: ALT 17; BUN 30; Creatinine, Ser 1.07; Potassium 5.0; Sodium 141  Recent Lipid Panel    Component Value Date/Time   CHOL 139 06/15/2022 1533   CHOL 133 06/10/2021 0924   TRIG 239 (H) 06/15/2022 1533   HDL 37 (L) 06/15/2022 1533   HDL 35 (L) 06/10/2021 0924   CHOLHDL 3.8 06/15/2022 1533   VLDL 48 (H) 06/15/2022 1533   LDLCALC 54 06/15/2022 1533   LDLCALC 63 06/10/2021 0924   LDLCALC  07/04/2017 0902     Comment:     . LDL cholesterol not calculated. Triglyceride levels greater than 400 mg/dL invalidate calculated LDL results. . Reference range: <100 . Desirable range <100 mg/dL for primary prevention;   <70 mg/dL for patients with CHD or diabetic patients  with > or = 2 CHD risk factors. Marland Kitchen LDL-C is now calculated using the Martin-Hopkins  calculation,  which is a validated novel method providing  better accuracy than the Friedewald equation in the  estimation of LDL-C.  Horald Pollen et al. Lenox Ahr. 1610;960(45): 2061-2068  (http://education.[employer].com/faq/FAQ164)    LDLDIRECT 72 06/15/2022 1533    PHYSICAL EXAM:    VS:  BP 132/72   Pulse 75   Ht 5\' 1"  (1.549 m)   Wt 167 lb 3.2 oz (75.8 kg)   SpO2 97%   BMI 31.59 kg/m   BMI: Body mass index is 31.59 kg/m.  Physical Exam Vitals reviewed.  Constitutional:      Appearance: She is well-developed.  HENT:     Head: Normocephalic and atraumatic.  Eyes:     General:        Right eye: No discharge.        Left eye: No discharge.  Neck:     Vascular: No JVD.  Cardiovascular:     Rate and Rhythm: Normal rate and regular rhythm.     Pulses:          Posterior tibial pulses are 2+ on the right side and 2+ on the left side.     Heart sounds: Normal heart sounds, S1 normal and S2 normal. Heart sounds not distant. No midsystolic click and no opening snap. No murmur heard.    No friction rub.  Pulmonary:     Effort: Pulmonary effort is normal. No respiratory distress.     Breath sounds: Normal breath sounds. No decreased breath sounds, wheezing or rales.  Chest:     Chest wall: No tenderness.  Abdominal:     General: There is no distension.  Musculoskeletal:     Cervical back: Normal range of motion.     Right lower leg: No edema.     Left lower leg: No edema.  Skin:    General: Skin is warm and dry.     Nails: There is no clubbing.  Neurological:     Mental Status: She is alert and oriented to person, place, and time.  Psychiatric:        Speech: Speech normal.        Behavior: Behavior normal.        Thought  Content: Thought content normal.        Judgment: Judgment normal.     Wt Readings from Last 3 Encounters:  02/14/23 167 lb 3.2 oz (75.8 kg)  01/03/23 164 lb 12.8 oz (74.8 kg)  06/15/22 171 lb 3.2 oz (77.7 kg)     ASSESSMENT & PLAN:   CAD status post  CABG without angina: She is doing very well and is without symptoms concerning for angina or cardiac decompensation.  Continue aggressive risk factor modification and secondary prevention including aspirin and a, atorvastatin, and metoprolol.  No indication for further ischemic testing at this time.  HFrEF secondary to ICM: She has subsequently had normalization of LV systolic function by echo in 2020.  She is euvolemic and well compensated with NYHA class I symptoms.  She remains on Lopressor 25 mg twice daily.  Given normalization of LV systolic function, and in the context of no heart failure symptoms, defer further GDMT at this time.  Now requiring a standing loop diuretic.  HTN: Blood pressure 142/70 with repeat blood pressure 132/72.  Blood pressure well-controlled at recent PCP visit.  She remains on metoprolol titrate 25 mg twice daily.  Continue low-sodium diet.  HLD: LDL 54 in 06/2022.  She remains on atorvastatin 80 mg.    Disposition: F/u with Dr. Okey Dupre or an APP in 6 months.   Medication Adjustments/Labs and Tests Ordered: Current medicines are reviewed at length with the patient today.  Concerns regarding medicines are outlined above. Medication changes, Labs and Tests ordered today are summarized above and listed in the Patient Instructions accessible in Encounters.   Signed, Eula Listen, PA-C 02/14/2023 9:24 AM     [facility] [place] [address] [place], [place] [address] [phone]

## 2023-02-14 ENCOUNTER — Ambulatory Visit: Payer: BC Managed Care – PPO | Attending: Physician Assistant | Admitting: Physician Assistant

## 2023-02-14 ENCOUNTER — Encounter: Payer: Self-pay | Admitting: Physician Assistant

## 2023-02-14 VITALS — BP 132/72 | HR 75 | Ht 61.0 in | Wt 167.2 lb

## 2023-02-14 DIAGNOSIS — I255 Ischemic cardiomyopathy: Secondary | ICD-10-CM

## 2023-02-14 DIAGNOSIS — I5020 Unspecified systolic (congestive) heart failure: Secondary | ICD-10-CM

## 2023-02-14 DIAGNOSIS — I2510 Atherosclerotic heart disease of native coronary artery without angina pectoris: Secondary | ICD-10-CM

## 2023-02-14 DIAGNOSIS — E785 Hyperlipidemia, unspecified: Secondary | ICD-10-CM | POA: Diagnosis not present

## 2023-02-14 DIAGNOSIS — I10 Essential (primary) hypertension: Secondary | ICD-10-CM

## 2023-02-14 NOTE — Patient Instructions (Signed)
Medication Instructions:  Your physician recommends that you continue on your current medications as directed. Please refer to the Current Medication list given to you today.  *If you need a refill on your cardiac medications before your next appointment, please call your pharmacy*   Lab Work: -None ordered If you have labs (blood work) drawn today and your tests are completely normal, you will receive your results only by: MyChart Message (if you have MyChart) OR A paper copy in the mail If you have any lab test that is abnormal or we need to change your treatment, we will call you to review the results.   Testing/Procedures: -None ordered   Follow-Up: At [facility], you and your health needs are our priority.  As part of our continuing mission to provide you with exceptional heart care, we have created designated Provider Care Teams.  These Care Teams include your primary Cardiologist (physician) and Advanced Practice Providers (APPs -  Physician Assistants and Nurse Practitioners) who all work together to provide you with the care you need, when you need it.  We recommend signing up for the patient portal called "MyChart".  Sign up information is provided on this After Visit Summary.  MyChart is used to connect with patients for Virtual Visits (Telemedicine).  Patients are able to view lab/test results, encounter notes, upcoming appointments, etc.  Non-urgent messages can be sent to your provider as well.   To learn more about what you can do with MyChart, go to [email or web address].    Your next appointment:   6 month(s)  Provider:   Ryan Dunn, PA-C    Other Instructions -None  

## 2023-04-20 ENCOUNTER — Other Ambulatory Visit: Payer: Self-pay | Admitting: Internal Medicine

## 2023-07-09 ENCOUNTER — Other Ambulatory Visit: Payer: BC Managed Care – PPO

## 2023-07-09 DIAGNOSIS — E781 Pure hyperglyceridemia: Secondary | ICD-10-CM

## 2023-07-09 DIAGNOSIS — E1169 Type 2 diabetes mellitus with other specified complication: Secondary | ICD-10-CM

## 2023-07-09 DIAGNOSIS — E1159 Type 2 diabetes mellitus with other circulatory complications: Secondary | ICD-10-CM

## 2023-07-09 DIAGNOSIS — Z0000 Encounter for general adult medical examination without abnormal findings: Secondary | ICD-10-CM

## 2023-07-16 ENCOUNTER — Encounter: Payer: BC Managed Care – PPO | Admitting: Family Medicine

## 2023-07-19 ENCOUNTER — Other Ambulatory Visit: Payer: Self-pay | Admitting: Internal Medicine

## 2023-07-19 NOTE — Telephone Encounter (Signed)
last visit on 02/14/23 with plan to f/u in 6 months, please schedule.

## 2023-07-20 NOTE — Telephone Encounter (Signed)
No answer no voicemail unable to leave message

## 2023-08-13 ENCOUNTER — Other Ambulatory Visit: Payer: Self-pay | Admitting: Family Medicine

## 2023-08-13 DIAGNOSIS — E1159 Type 2 diabetes mellitus with other circulatory complications: Secondary | ICD-10-CM

## 2023-08-14 NOTE — Telephone Encounter (Signed)
Requested medication (s) are due for refill today: yes  Requested medication (s) are on the active medication list: yes  Last refill:  08/16/22  Future visit scheduled: no  Notes to clinic:  Unable to refill per protocol due to failed labs, no updated results.      Requested Prescriptions  Pending Prescriptions Disp Refills   metFORMIN (GLUCOPHAGE) 500 MG tablet [Pharmacy Med Name: METFORMIN HCL TABS 500MG ] 270 tablet 3    Sig: TAKE 2 TABLETS IN THE MORNING AND 1 TABLET IN THE EVENING WITH MEALS     Endocrinology:  Diabetes - Biguanides Failed - 08/13/2023  1:36 AM      Failed - Cr in normal range and within 360 days    Creat  Date Value Ref Range Status  09/28/2021 0.77 0.50 - 1.05 mg/dL Final   Creatinine, Ser  Date Value Ref Range Status  06/15/2022 1.07 (H) 0.44 - 1.00 mg/dL Final   Creatinine, Urine  Date Value Ref Range Status  09/28/2021 82 20 - 275 mg/dL Final         Failed - HBA1C is between 0 and 7.9 and within 180 days    Hemoglobin A1C  Date Value Ref Range Status  09/28/2021 7.5 (A) 4.0 - 5.6 % Final   Hgb A1c MFr Bld  Date Value Ref Range Status  03/12/2019 6.4 (H) <5.7 % of total Hgb Final    Comment:    For someone without known diabetes, a hemoglobin  A1c value between 5.7% and 6.4% is consistent with prediabetes and should be confirmed with a  follow-up test. . For someone with known diabetes, a value <7% indicates that their diabetes is well controlled. A1c targets should be individualized based on duration of diabetes, age, comorbid conditions, and other considerations. . This assay result is consistent with an increased risk of diabetes. . Currently, no consensus exists regarding use of hemoglobin A1c for diagnosis of diabetes for children. .          Failed - eGFR in normal range and within 360 days    GFR, Est African American  Date Value Ref Range Status  03/05/2019 78 > OR = 60 mL/min/1.47m2 Final   GFR calc Af Amer  Date  Value Ref Range Status  08/18/2020 75 >59 mL/min/1.73 Final    Comment:    **In accordance with recommendations from the NKF-ASN Task force,**   [facility] is in the process of updating its eGFR calculation to the   2021 CKD-EPI creatinine equation that estimates kidney function   without a race variable.    GFR, Est Non African American  Date Value Ref Range Status  03/05/2019 68 > OR = 60 mL/min/1.48m2 Final   GFR, Estimated  Date Value Ref Range Status  06/15/2022 57 (L) >60 mL/min Final    Comment:    (NOTE) Calculated using the CKD-EPI Creatinine Equation (2021)    eGFR  Date Value Ref Range Status  09/28/2021 85 > OR = 60 mL/min/1.18m2 Final    Comment:    The eGFR is based on the CKD-EPI 2021 equation. To calculate  the new eGFR from a previous Creatinine or Cystatin C result, go to https://www.kidney.org/professionals/ kdoqi/gfr%5Fcalculator          Failed - B12 Level in normal range and within 720 days    No results found for: "VITAMINB12"       Failed - Valid encounter within last 6 months    Recent Outpatient Visits  7 months ago Acute non-recurrent frontal sinusitis   [place] [facility], Netta Neat, DO   1 year ago Annual physical exam   [facility] Smitty Cords, DO   2 years ago Acute non-recurrent maxillary sinusitis   [place] [facility] Smitty Cords, DO   2 years ago Type 2 diabetes mellitus with vascular disease [facility])   [place] [facility] Smitty Cords, DO   3 years ago Dysuria    [facility], Jodelle Gross, FNP              Failed - CBC within normal limits and completed in the last 12 months    WBC  Date Value Ref Range Status  09/28/2021 6.0 3.8 - 10.8 Thousand/uL Final   RBC  Date Value Ref Range Status  09/28/2021 4.27 3.80 - 5.10 Million/uL Final   Hemoglobin   Date Value Ref Range Status  09/28/2021 12.8 11.7 - 15.5 g/dL Final  78/29/5621 30.8 11.1 - 15.9 g/dL Final   Total hemoglobin  Date Value Ref Range Status  08/29/2018 9.2 (L) 12.0 - 16.0 g/dL Final   HCT  Date Value Ref Range Status  09/28/2021 39.5 35.0 - 45.0 % Final   Hematocrit  Date Value Ref Range Status  08/18/2020 39.3 34.0 - 46.6 % Final   MCHC  Date Value Ref Range Status  09/28/2021 32.4 32.0 - 36.0 g/dL Final   [facility]  Date Value Ref Range Status  09/28/2021 30.0 27.0 - 33.0 pg Final   MCV  Date Value Ref Range Status  09/28/2021 92.5 80.0 - 100.0 fL Final  08/18/2020 93 79 - 97 fL Final   No results found for: "PLTCOUNTKUC", "LABPLAT", "POCPLA" RDW  Date Value Ref Range Status  09/28/2021 13.1 11.0 - 15.0 % Final  08/18/2020 12.7 11.7 - 15.4 % Final

## 2023-10-01 ENCOUNTER — Encounter: Payer: Self-pay | Admitting: Internal Medicine

## 2023-10-01 ENCOUNTER — Ambulatory Visit: Payer: BC Managed Care – PPO | Admitting: Internal Medicine

## 2023-10-01 ENCOUNTER — Ambulatory Visit: Payer: Self-pay

## 2023-10-01 VITALS — BP 132/72 | Ht 61.0 in | Wt 172.2 lb

## 2023-10-01 DIAGNOSIS — N3000 Acute cystitis without hematuria: Secondary | ICD-10-CM

## 2023-10-01 DIAGNOSIS — Z23 Encounter for immunization: Secondary | ICD-10-CM

## 2023-10-01 DIAGNOSIS — R300 Dysuria: Secondary | ICD-10-CM | POA: Diagnosis not present

## 2023-10-01 LAB — POCT URINALYSIS DIPSTICK
Bilirubin, UA: NEGATIVE
Blood, UA: NEGATIVE
Glucose, UA: POSITIVE — AB
Ketones, UA: NEGATIVE
Nitrite, UA: POSITIVE
Odor: POSITIVE
Protein, UA: POSITIVE — AB
Spec Grav, UA: 1.02 (ref 1.010–1.025)
Urobilinogen, UA: 0.2 U/dL
pH, UA: 6 (ref 5.0–8.0)

## 2023-10-01 MED ORDER — NITROFURANTOIN MONOHYD MACRO 100 MG PO CAPS: 100.0000 mg | ORAL_CAPSULE | Freq: Two times a day (BID) | ORAL | 0 refills | Status: DC

## 2023-10-01 NOTE — Addendum Note (Signed)
Addended by: Luanna Cole D on: 10/01/2023 03:02 PM   Modules accepted: Orders

## 2023-10-01 NOTE — Addendum Note (Signed)
Addended by: Luanna Cole D on: 10/01/2023 03:17 PM   Modules accepted: Orders

## 2023-10-01 NOTE — Telephone Encounter (Signed)
  Chief Complaint: urinary sx.  Symptoms: pain / burning with urination, odor. Has been drinking water and cranberry juice Frequency: Friday  Pertinent Negatives: Patient denies fever no blood in urine no abdominal pain  Disposition: [] ED /[] Urgent Care (no appt availability in office) / [x] Appointment(In office/virtual)/ []  [place] [facility]/ [] Home Care/ [] Refused Recommended Disposition /[] [place] [facility]/ []  Follow-up with PCP Additional Notes:   Appt today . None available with PCP.       Reason for Disposition  [1] SEVERE pain with urination (e.g., excruciating) AND [2] not improved after 2 hours of pain medicine and Sitz bath  Answer Assessment - Initial Assessment Questions 1. SEVERITY: "How bad is the pain?"  (e.g., Scale 1-10; mild, moderate, or severe)   - MILD (1-3): complains slightly about urination hurting   - MODERATE (4-7): interferes with normal activities     - SEVERE (8-10): excruciating, unwilling or unable to urinate because of the pain      Pain with urination  2. FREQUENCY: "How many times have you had painful urination today?"      Comes and goes  3. PATTERN: "Is pain present every time you urinate or just sometimes?"      No  4. ONSET: "When did the painful urination start?"      Friday  5. FEVER: "Do you have a fever?" If Yes, ask: "What is your temperature, how was it measured, and when did it start?"     na 6. PAST UTI: "Have you had a urine infection before?" If Yes, ask: "When was the last time?" and "What happened that time?"      Yes  7. CAUSE: "What do you think is causing the painful urination?"  (e.g., UTI, scratch, Herpes sore)     UTI  8. OTHER SYMPTOMS: "Do you have any other symptoms?" (e.g., blood in urine, flank pain, genital sores, urgency, vaginal discharge)     Odor , pain urination  9. PREGNANCY: "Is there any chance you are pregnant?" "When was your last menstrual period?"     na  Protocols used: Urination Pain -  Female-A-AH

## 2023-10-01 NOTE — Telephone Encounter (Signed)
Second attempt to reach pt. Left message to call back about symptoms. 

## 2023-10-01 NOTE — Progress Notes (Signed)
HPI  Pt presents to the clinic today with c/o urgency, burning with urination and odor.  She reports this started 1 week ago.  She denies frequency, dysuria, blood in her urine, vaginal discharge, vaginal itching, abnormal vaginal bleeding.  She denies fever, chills, nausea, vomiting, abdominal pain or low back pain.  She has tried cranberry juice OTC with minimal relief of symptoms.   Review of Systems  Past Medical History:  Diagnosis Date   Anxiety    Asthma    In cold weather (08/21/2018)   CAD (coronary artery disease)    a. 08/2018 NSTEMI/Cath: LM 50d,LAD 80ost/p, 54m 70d, LCX 90ost, OM1 50, OM3 70, OM4 80, LPDA small, RCA  99p/d, EF 30-35%; c. 08/2018 CABG x 4: LIMA->LAD, VG->RI, VG->OM, VG->RCA.   Chronic combined systolic (congestive) and diastolic (congestive) heart failure (HCC)    a. 08/2018 Echo: EF 40-45%, mid-apicalanteroseptal and apical HK. Gr2 DD. Mild AI; b. 08/2018 intraop TEE: EF 45-50%, antsept, infsept HK. Mild AI. Mildly dil Ao root. Trace MR.   Family history of adverse reaction to anesthesia    "mother PONV; talked crazy/loud after anesthesia" (08/21/2018)   GERD (gastroesophageal reflux disease)    High cholesterol    Ischemic cardiomyopathy    a. 08/2018 Echo: EF 40-45%; b. 08/2018 TEE: EF 45-50%.   Seasonal allergies    Tuberculosis ~ 1960   Type II diabetes mellitus (HCC)    UTI (lower urinary tract infection)    "once; before hysterectomy" (08/21/2018)    Family History  Problem Relation Age of Onset   Cancer Sister    Heart disease Neg Hx     Social History   Socioeconomic History   Marital status: Married    Spouse name: Not on file   Number of children: 2   Years of education: 18   Highest education level: Not on file  Occupational History   Occupation: Set designer: [employer]    Comment: Clinical cytogeneticist  Tobacco Use   Smoking status: Never   Smokeless tobacco: Never  Vaping Use   Vaping status: Never Used   Substance and Sexual Activity   Alcohol use: Not Currently   Drug use: Never   Sexual activity: Not on file  Other Topics Concern   Not on file  Social History Narrative   Not on file   Social Drivers of Health   Financial Resource Strain: Not on file  Food Insecurity: Not on file  Transportation Needs: Not on file  Physical Activity: Not on file  Stress: Not on file  Social Connections: Not on file  Intimate Partner Violence: Not on file    Allergies  Allergen Reactions   Shellfish Allergy Hives   Augmentin [Amoxicillin-Pot Clavulanate] Diarrhea   Avocado     UNSPECIFIED REACTION    Advair Diskus [Fluticasone-Salmeterol] Palpitations   Cortisone Other (See Comments)    drowsiness   Pecan Nut (Diagnostic) Itching   Salmon [Fish Allergy] Rash    Rash and itching of ears     Constitutional: Denies fever, malaise, fatigue, headache or abrupt weight changes.   GU: Pt reports urgency, burning with urination and urine odor. Denies frequency, dysuria, blood in urine, odor or discharge. Skin: Denies redness, rashes, lesions or ulcercations.   No other specific complaints in a complete review of systems (except as listed in HPI above).    Objective:   Physical Exam  BP 132/72 (BP Location: Left Arm, Patient Position: Sitting, Cuff Size: Normal)  Ht 5\' 1"  (1.549 m)   Wt 172 lb 3.2 oz (78.1 kg)   BMI 32.54 kg/m   Wt Readings from Last 3 Encounters:  02/14/23 167 lb 3.2 oz (75.8 kg)  01/03/23 164 lb 12.8 oz (74.8 kg)  06/15/22 171 lb 3.2 oz (77.7 kg)    General: Appears her stated age, obese, in NAD. Cardiovascular: Normal rate. Pulmonary/Chest: Normal effort. Abdomen: Soft. Normal bowel sounds. No distention or masses noted.  Tender to palpation over the bladder area. No CVA tenderness.        Assessment & Plan:  Acute cystitis without hematuria:  Urinalysis: Abnormal Will send urine culture eRx sent if for Macrobid 100 mg BID x 5 days OK to take AZO  OTC Drink plenty of fluids  RTC as needed or if symptoms persist. Nicki Reaper, NP

## 2023-10-01 NOTE — Patient Instructions (Signed)
Urinary Tract Infection, Adult A urinary tract infection (UTI) is an infection of any part of the urinary tract. The urinary tract includes: The kidneys. The ureters. The bladder. The urethra. These organs make, store, and get rid of pee (urine) in the body. What are the causes? This infection is caused by germs (bacteria) in your genital area. These germs grow and cause swelling (inflammation) of your urinary tract. What increases the risk? The following factors may make you more likely to develop this condition: Using a small, thin tube (catheter) to drain pee. Not being able to control when you pee or poop (incontinence). Being female. If you are female, these things can increase the risk: Using these methods to prevent pregnancy: A medicine that kills sperm (spermicide). A device that blocks sperm (diaphragm). Having low levels of a female hormone (estrogen). Being pregnant. You are more likely to develop this condition if: You have genes that add to your risk. You are sexually active. You take antibiotic medicines. You have trouble peeing because of: A prostate that is bigger than normal, if you are female. A blockage in the part of your body that drains pee from the bladder. A kidney stone. A nerve condition that affects your bladder. Not getting enough to drink. Not peeing often enough. You have other conditions, such as: Diabetes. A weak disease-fighting system (immune system). Sickle cell disease. Gout. Injury of the spine. What are the signs or symptoms? Symptoms of this condition include: Needing to pee right away. Peeing small amounts often. Pain or burning when peeing. Blood in the pee. Pee that smells bad or not like normal. Trouble peeing. Pee that is cloudy. Fluid coming from the vagina, if you are female. Pain in the belly or lower back. Other symptoms include: Vomiting. Not feeling hungry. Feeling mixed up (confused). This may be the first symptom in  older adults. Being tired and grouchy (irritable). A fever. Watery poop (diarrhea). How is this treated? Taking antibiotic medicine. Taking other medicines. Drinking enough water. In some cases, you may need to see a specialist. Follow these instructions at home:  Medicines Take over-the-counter and prescription medicines only as told by your doctor. If you were prescribed an antibiotic medicine, take it as told by your doctor. Do not stop taking it even if you start to feel better. General instructions Make sure you: Pee until your bladder is empty. Do not hold pee for a long time. Empty your bladder after sex. Wipe from front to back after peeing or pooping if you are a female. Use each tissue one time when you wipe. Drink enough fluid to keep your pee pale yellow. Keep all follow-up visits. Contact a doctor if: You do not get better after 1-2 days. Your symptoms go away and then come back. Get help right away if: You have very bad back pain. You have very bad pain in your lower belly. You have a fever. You have chills. You feeling like you will vomit or you vomit. Summary A urinary tract infection (UTI) is an infection of any part of the urinary tract. This condition is caused by germs in your genital area. There are many risk factors for a UTI. Treatment includes antibiotic medicines. Drink enough fluid to keep your pee pale yellow. This information is not intended to replace advice given to you by your health care provider. Make sure you discuss any questions you have with your health care provider. Document Revised: 05/02/2020 Document Reviewed: 05/07/2020 Elsevier Patient Education    2024 [employer]  

## 2023-10-01 NOTE — Telephone Encounter (Signed)
Pt called, unable to LVM d/t VM not set up.   Summary: poss UTI   Pt states she is having UA sx and needs appt.  No fever, just painful urination.  No apps, pls advise

## 2023-10-03 LAB — URINE CULTURE
MICRO NUMBER:: 15884487
SPECIMEN QUALITY:: ADEQUATE

## 2023-10-17 ENCOUNTER — Other Ambulatory Visit: Payer: Self-pay | Admitting: Internal Medicine

## 2023-10-17 NOTE — Telephone Encounter (Signed)
 LVM to schedule f/u appt

## 2023-10-17 NOTE — Telephone Encounter (Signed)
 Please contact pt for future appointment. Pt due for follow up.

## 2023-12-27 NOTE — Progress Notes (Unsigned)
 Cardiology Office Note    Date:  12/28/2023   ID:  Olivia, Roth [birth year], MRN [id number]  PCP:  Olivia Cords, DO  Cardiologist:  Olivia Kendall, MD  Electrophysiologist:  None   Chief Complaint: Follow-up  History of Present Illness:   Olivia Roth is a 68 y.o. female with history of CAD with NSTEMI status post four-vessel CABG in 08/2018 with LIMA to LAD, SVG to RI, SVG to OM, and SVG to RCA, HFrEF secondary to ICM with subsequent improvement of LV systolic function by echo in 2020, DM2, HTN, HLD, GERD, and anxiety who presents for follow-up of CAD, cardiomyopathy, HTN, and HLD.  She was admitted to the hospital in 08/2018 with an NSTEMI.  Echo showed an EF of 40 to 45%.  LHC showed severe multivessel CAD with an EF of 30 to 35%.  She was transferred to [facility] and underwent four-vessel CABG with relatively uncomplicated postoperative course.  Pre-CABG imaging showed bilateral ICA stenosis of 1 to 39% with normal ABIs bilaterally.  Intraoperative TEE showed an EF of 45 to 50% with hypokinesis of the anteroseptal and inferoseptal wall.  Most recent echo from 07/2019 demonstrated an EF of 50 to 55%, grade 1 diastolic dysfunction, normal RV systolic function and ventricular cavity size, and no significant valvular abnormalities.  She was last seen in the office in 02/2023 and was doing well from a cardiac perspective.  She remained very active at baseline without cardiac limitation.  No changes in cardiac pharmacotherapy were indicated at that time.  She comes in doing well from a cardiac perspective and is without symptoms of angina or cardiac decompensation.  No dyspnea, dizziness, presyncope, or syncope.  She remains very active at baseline without symptoms of of chest pain.  She does notice some left-sided neck discomfort if she walks up an incline or goes up a flight of stairs.  She indicates this has been present for many years and predates her MI/CABG and is unchanged.   She is working on improving her diet.  No falls or symptoms concerning for bleeding.  Adherent and tolerating cardiac pharmacotherapy without off target effect.   Labs independently reviewed: 06/2022 - potassium 5.0, BUN 30, serum creatinine 1.07, albumin 4.4, AST/ALT normal, TC 139, TG 239, HDL 37, LDL 54, direct LDL 72 09/2021 - TSH normal, Hgb 12.8, PLT 231  Past Medical History:  Diagnosis Date   Anxiety    Asthma    In cold weather (08/21/2018)   CAD (coronary artery disease)    a. 08/2018 NSTEMI/Cath: LM 50d,LAD 80ost/p, 36m 70d, LCX 90ost, OM1 50, OM3 70, OM4 80, LPDA small, RCA  99p/d, EF 30-35%; c. 08/2018 CABG x 4: LIMA->LAD, VG->RI, VG->OM, VG->RCA.   Chronic combined systolic (congestive) and diastolic (congestive) heart failure (HCC)    a. 08/2018 Echo: EF 40-45%, mid-apicalanteroseptal and apical HK. Gr2 DD. Mild AI; b. 08/2018 intraop TEE: EF 45-50%, antsept, infsept HK. Mild AI. Mildly dil Ao root. Trace MR.   Family history of adverse reaction to anesthesia    "mother PONV; talked crazy/loud after anesthesia" (08/21/2018)   GERD (gastroesophageal reflux disease)    High cholesterol    Ischemic cardiomyopathy    a. 08/2018 Echo: EF 40-45%; b. 08/2018 TEE: EF 45-50%.   Seasonal allergies    Tuberculosis ~ 1960   Type II diabetes mellitus (HCC)    UTI (lower urinary tract infection)    "once; before hysterectomy" (08/21/2018)    Past Surgical  History:  Procedure Laterality Date   CARDIAC CATHETERIZATION     CORONARY ARTERY BYPASS GRAFT N/A 08/26/2018   Procedure: CORONARY ARTERY BYPASS GRAFTING (CABG) times four using left internal mammary artery and right leg saphenous vein grafts;  Surgeon: Olivia Perna, MD;  Location: [facility] OR;  Service: Open Heart Surgery;  Laterality: N/A;   GANGLION CYST EXCISION Left ~ 1960   neck   LEFT HEART CATH AND CORONARY ANGIOGRAPHY N/A 08/21/2018   Procedure: LEFT HEART CATH AND CORONARY ANGIOGRAPHY;  Surgeon: Olivia Kendall, MD;   Location: [facility] INVASIVE CV LAB;  Service: Cardiovascular;  Laterality: N/A;   TEE WITHOUT CARDIOVERSION N/A 08/26/2018   Procedure: TRANSESOPHAGEAL ECHOCARDIOGRAM (TEE);  Surgeon: Olivia Roth, Olivia Arista, MD;  Location: [facility] OR;  Service: Open Heart Surgery;  Laterality: N/A;   TOOTH EXTRACTION     TOTAL ABDOMINAL HYSTERECTOMY  2006    Current Medications: Current Meds  Medication Sig   acetaminophen (TYLENOL) 500 MG tablet Take 2 tablets (1,000 mg total) by mouth every 6 (six) hours as needed.   albuterol (VENTOLIN HFA) 108 (90 Base) MCG/ACT inhaler Inhale 2 puffs into the lungs every 6 (six) hours as needed for wheezing or shortness of breath.   aspirin EC 81 MG tablet Take 1 tablet (81 mg total) by mouth daily.   atorvastatin (LIPITOR) 80 MG tablet Take 1 tablet (80 mg total) by mouth daily.   fluticasone (FLONASE) 50 MCG/ACT nasal spray Place 2 sprays into both nostrils daily. Use for 4-6 weeks then stop and use seasonally or as needed.   metFORMIN (GLUCOPHAGE) 500 MG tablet TAKE 2 TABLETS IN THE MORNING AND 1 TABLET IN THE EVENING WITH MEALS   metoprolol tartrate (LOPRESSOR) 25 MG tablet Take 1 tablet (25 mg total) by mouth 2 (two) times daily.    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[employer]    Comment: Clinical cytogeneticist  Tobacco Use   Smoking status: Never   Smokeless tobacco: Never  Vaping Use   Vaping status: Never Used  Substance and Sexual Activity   Alcohol use: Not Currently   Drug use: Never   Sexual activity: Not on file  Other Topics Concern   Not on file  Social History  Narrative   Not on file   Social Drivers of Health   Financial Resource Strain: Not on file  Food Insecurity: Not on file  Transportation Needs: Not on file  Physical Activity: Not on file  Stress: Not on file  Social Connections: Not on file     Family History:  The patient's family history includes Cancer in her sister. There is no history of Heart disease.  ROS:   12-point review of systems is negative unless otherwise noted in the HPI.   EKGs/Labs/Other Studies Reviewed:    Studies reviewed were summarized above. The additional studies were reviewed today:  Limited echo 08/06/2019: 1. Left ventricular ejection fraction, by visual estimation, is 50 to  55%. The left ventricle has normal function. Normal left ventricular size.  There is no left ventricular hypertrophy.   2. Left ventricular diastolic parameters are consistent  with Grade I  diastolic dysfunction (impaired relaxation).   3. Global right ventricle has normal systolic function.The right  ventricular size is normal. Right vetricular wall thickness was not  assessed.   4. Left atrial size was normal.   5. Right atrial size was normal.   6. The mitral valve is normal in structure. No evidence of mitral valve  regurgitation.   7. The tricuspid valve is normal in structure. Tricuspid valve  regurgitation is not demonstrated.   8. The aortic valve was not well visualized. Aortic valve regurgitation  is mild.   9. The pulmonic valve was not assessed. Pulmonic valve regurgitation not  assessed. __________   Intraoperative TEE 08/26/2018:  Left Atrium: Normal size, no evidence of LAA thrombus with PWD  velocities > 40 mm/s in LAA   Septum: No Patent Foramen Ovale present, no evidence of PFO or ASD on  color flow doppler   Left Ventricle: Normal chamber size and reduced systolic function, LVEF  45-50%, hypokinesis of the anteroseptal and inferoseptal wall.   Aortic valve: Tricuspid valve noted, valve  leaflets demonstrate normal  motion with minimal sclerosis or calcification. Mild regurgitation.   Aorta: The aortic root and ascending aorta are mildly dilated.   Mitral valve: Trace regurgitation with a central jet.   Right Atrium: Normal size, PA catheter noted traversing RA   Right ventricle: Normal cavity size, wall thickness and ejection  fraction.   Pulmonic Valve: No insufficiency seen on color doppler.   Aorta: Grade 2 calcification of descending aorta and aortic arch, no  evidence of dissection.   Pericardium: no significant pericardial effusion. __________   Pre-CABG carotid and peripheral arterial ultrasound 08/23/2018: Summary:  Right Carotid: Velocities in the right ICA are consistent with a 1-39%  stenosis.   Left Carotid: Velocities in the left ICA are consistent with a 1-39%  stenosis.  Vertebrals: Bilateral vertebral arteries demonstrate antegrade flow.   Right ABI: Resting right ankle-brachial index is within normal range. No  evidence of significant right lower extremity arterial disease.  Left ABI: Resting left ankle-brachial index is within normal range. No  evidence of significant left lower extremity arterial disease.  __________   2D echo 08/22/2018: - Left ventricle: Systolic function was mildly to moderately    reduced. The estimated ejection fraction was in the range of 40%    to 45%. Moderate hypokinesis of the mid-apicalanteroseptal and    apical myocardium. Features are consistent with a pseudonormal    left ventricular filling pattern, with concomitant abnormal    relaxation and increased filling pressure (grade 2 diastolic    dysfunction).  - Aortic valve: There was mild regurgitation. __________   LHC 08/21/2018: Conclusions: Severe three-vessel coronary artery disease, including 50% distal LMCA, 80% ostial/proximal LAD, and 90% ostial LCx, and 99% mid RCA stenoses. Moderately to severely reduced left ventricular systolic function  (LVEF 30-35%) with global hypokinesis and mid/apical akinesis. Moderately elevated left ventricular filling pressure.   Recommendations: Transfer to [facility] for surgical consultation for CABG. Gentle diuresis. Restart heparin infusion 2 hours after TR band deflation. Aggressive secondary prevention and medical therapy.  Will continue carvedilol 3.125 mg BID and increase atorvastatin to 80 mg daily.   Recommend uninterrupted dual antiplatelet therapy with aspirin 81mg  daily and clopidogrel 75mg  daily for a minimum of 12 months (ACS - Class I recommendation).  I will defer starting clopidogrel pending cardiac surgery consultation.   EKG:  EKG is not ordered today.    Recent Labs:  No results found for requested labs within last 365 days.  Recent Lipid Panel    Component Value Date/Time   CHOL 139 06/15/2022 1533   CHOL 133 06/10/2021 0924   TRIG 239 (H) 06/15/2022 1533   HDL 37 (L) 06/15/2022 1533   HDL 35 (L) 06/10/2021 0924   CHOLHDL 3.8 06/15/2022 1533   VLDL 48 (H) 06/15/2022 1533   LDLCALC 54 06/15/2022 1533   LDLCALC 63 06/10/2021 0924   LDLCALC  07/04/2017 0902     Comment:     . LDL cholesterol not calculated. Triglyceride levels greater than 400 mg/dL invalidate calculated LDL results. . Reference range: <100 . Desirable range <100 mg/dL for primary prevention;   <70 mg/dL for patients with CHD or diabetic patients  with > or = 2 CHD risk factors. Marland Kitchen LDL-C is now calculated using the Martin-Hopkins  calculation, which is a validated novel method providing  better accuracy than the Friedewald equation in the  estimation of LDL-C.  Horald Pollen et al. Lenox Ahr. 7829;562(13): 2061-2068  (http://education.[employer].com/faq/FAQ164)    LDLDIRECT 72 06/15/2022 1533    PHYSICAL EXAM:    VS:  BP (!) 140/80   Pulse 71   Ht 5\' 1"  (1.549 m)   Wt 169 lb (76.7 kg)   BMI 31.93 kg/m   BMI: Body mass index is 31.93 kg/m.  Physical Exam Vitals reviewed.   Constitutional:      Appearance: She is well-developed.  HENT:     Head: Normocephalic and atraumatic.  Eyes:     General:        Right eye: No discharge.        Left eye: No discharge.  Cardiovascular:     Rate and Rhythm: Normal rate and regular rhythm.     Pulses:          Posterior tibial pulses are 2+ on the right side and 2+ on the left side.     Heart sounds: Normal heart sounds, S1 normal and S2 normal. Heart sounds not distant. No midsystolic click and no opening snap. No murmur heard.    No friction rub.  Pulmonary:     Effort: Pulmonary effort is normal. No respiratory distress.     Breath sounds: Normal breath sounds. No decreased breath sounds, wheezing, rhonchi or rales.  Chest:     Chest wall: No tenderness.  Musculoskeletal:     Cervical back: Normal range of motion.     Right lower leg: No edema.     Left lower leg: No edema.  Skin:    General: Skin is warm and dry.     Nails: There is no clubbing.  Neurological:     Mental Status: She is alert and oriented to person, place, and time.  Psychiatric:        Speech: Speech normal.        Behavior: Behavior normal.        Thought Content: Thought content normal.        Judgment: Judgment normal.     Wt Readings from Last 3 Encounters:  12/28/23 169 lb (76.7 kg)  10/01/23 172 lb 3.2 oz (78.1 kg)  02/14/23 167 lb 3.2 oz (75.8 kg)     ASSESSMENT & PLAN:   CAD status post CABG without angina: She continues to do well and is without symptoms of chest pain or cardiac decompensation.  She notes a longstanding history of left-sided neck discomfort that occurs when walking up an incline or upstairs.  This is unchanged and predates her CABG.  We did discuss pursuing stress testing to evaluate for high risk ischemia, she declines this at this time.  Continue aggressive risk factor modification and secondary prevention including aspirin 81 mg, atorvastatin 80 mg, and Lopressor 25 mg twice daily.  Obtain CMP, lipid  panel, and CBC.  HFimpEF secondary to ICM: Euvolemic, well compensated with NYHA class I symptoms.  She remains on Lopressor 25 mg twice daily.  Given normalization of LV systolic function, and in the context of no heart failure symptoms, we will defer further GDMT at this time.  Should she have recurrence of symptoms or cardiomyopathy would escalate GDMT moving forward.  Not requiring standing loop diuretic.  CHF education.  HTN: Blood pressure is mildly elevated in the office today, typically well-controlled at home.  She remains on Lopressor 25 mg twice daily.  Low-sodium diet encouraged.  HLD: LDL 54 in 06/2022.  She remains on atorvastatin 80 mg.  Check CMP and lipid panel with recommendation to escalate lipid-lowering therapy as indicated to maintain target LDL of less than 70.    Disposition: F/u with Dr. Okey Dupre or an APP in 6 months.   Medication Adjustments/Labs and Tests Ordered: Current medicines are reviewed at length with the patient today.  Concerns regarding medicines are outlined above. Medication changes, Labs and Tests ordered today are summarized above and listed in the Patient Instructions accessible in Encounters.   Signed, Eula Listen, PA-C 12/28/2023 10:23 AM     [facility] [place] [address] [place], [place] [address] [phone]

## 2023-12-28 ENCOUNTER — Ambulatory Visit: Payer: BC Managed Care – PPO | Attending: Physician Assistant | Admitting: Physician Assistant

## 2023-12-28 ENCOUNTER — Encounter: Payer: Self-pay | Admitting: Physician Assistant

## 2023-12-28 VITALS — BP 140/80 | HR 71 | Ht 61.0 in | Wt 169.0 lb

## 2023-12-28 DIAGNOSIS — I2510 Atherosclerotic heart disease of native coronary artery without angina pectoris: Secondary | ICD-10-CM

## 2023-12-28 DIAGNOSIS — I10 Essential (primary) hypertension: Secondary | ICD-10-CM

## 2023-12-28 DIAGNOSIS — I255 Ischemic cardiomyopathy: Secondary | ICD-10-CM

## 2023-12-28 DIAGNOSIS — E785 Hyperlipidemia, unspecified: Secondary | ICD-10-CM

## 2023-12-28 DIAGNOSIS — I5032 Chronic diastolic (congestive) heart failure: Secondary | ICD-10-CM | POA: Diagnosis not present

## 2023-12-28 NOTE — Patient Instructions (Signed)
 Medication Instructions:  Your physician recommends that you continue on your current medications as directed. Please refer to the Current Medication list given to you today.   *If you need a refill on your cardiac medications before your next appointment, please call your pharmacy*   Lab Work: Your physician recommends that you return for lab work today: CBC, CMP and LIPIDS  If you have labs (blood work) drawn today and your tests are completely normal, you will receive your results only by: MyChart Message (if you have MyChart) OR A paper copy in the mail If you have any lab test that is abnormal or we need to change your treatment, we will call you to review the results.   Follow-Up: At [facility], you and your health needs are our priority.  As part of our continuing mission to provide you with exceptional heart care, we have created designated Provider Care Teams.  These Care Teams include your primary Cardiologist (physician) and Advanced Practice Providers (APPs -  Physician Assistants and Nurse Practitioners) who all work together to provide you with the care you need, when you need it.  We recommend signing up for the patient portal called "MyChart".  Sign up information is provided on this After Visit Summary.  MyChart is used to connect with patients for Virtual Visits (Telemedicine).  Patients are able to view lab/test results, encounter notes, upcoming appointments, etc.  Non-urgent messages can be sent to your provider as well.   To learn more about what you can do with MyChart, go to [email or web address].    Your next appointment:   6 month(s)  Provider:   Eula Listen, PA-C

## 2023-12-29 LAB — CBC
Hematocrit: 38.9 % (ref 34.0–46.6)
Hemoglobin: 12.5 g/dL (ref 11.1–15.9)
MCH: 29.9 pg (ref 26.6–33.0)
MCHC: 32.1 g/dL (ref 31.5–35.7)
MCV: 93 fL (ref 79–97)
Platelets: 230 10*3/uL (ref 150–450)
RBC: 4.18 x10E6/uL (ref 3.77–5.28)
RDW: 13.2 % (ref 11.7–15.4)
WBC: 5.5 10*3/uL (ref 3.4–10.8)

## 2023-12-29 LAB — COMPREHENSIVE METABOLIC PANEL
ALT: 13 IU/L (ref 0–32)
AST: 21 IU/L (ref 0–40)
Albumin: 4.4 g/dL (ref 3.9–4.9)
Alkaline Phosphatase: 110 IU/L (ref 44–121)
BUN/Creatinine Ratio: 20 (ref 12–28)
BUN: 21 mg/dL (ref 8–27)
Bilirubin Total: 0.4 mg/dL (ref 0.0–1.2)
CO2: 21 mmol/L (ref 20–29)
Calcium: 9.6 mg/dL (ref 8.7–10.3)
Chloride: 104 mmol/L (ref 96–106)
Creatinine, Ser: 1.03 mg/dL — ABNORMAL HIGH (ref 0.57–1.00)
Globulin, Total: 2.6 g/dL (ref 1.5–4.5)
Glucose: 209 mg/dL — ABNORMAL HIGH (ref 70–99)
Potassium: 4.6 mmol/L (ref 3.5–5.2)
Sodium: 141 mmol/L (ref 134–144)
Total Protein: 7 g/dL (ref 6.0–8.5)
eGFR: 59 mL/min/{1.73_m2} — ABNORMAL LOW (ref >59)

## 2023-12-29 LAB — LIPID PANEL
Chol/HDL Ratio: 3.5 ratio (ref 0.0–4.4)
Cholesterol, Total: 138 mg/dL (ref 100–199)
HDL: 40 mg/dL (ref >39)
LDL Chol Calc (NIH): 60 mg/dL (ref 0–99)
Triglycerides: 234 mg/dL — ABNORMAL HIGH (ref 0–149)
VLDL Cholesterol Cal: 38 mg/dL (ref 5–40)

## 2024-04-14 ENCOUNTER — Other Ambulatory Visit: Payer: Self-pay | Admitting: Internal Medicine

## 2024-05-01 DIAGNOSIS — H2513 Age-related nuclear cataract, bilateral: Secondary | ICD-10-CM | POA: Diagnosis not present

## 2024-05-01 DIAGNOSIS — H04123 Dry eye syndrome of bilateral lacrimal glands: Secondary | ICD-10-CM | POA: Diagnosis not present

## 2024-05-01 DIAGNOSIS — H43392 Other vitreous opacities, left eye: Secondary | ICD-10-CM | POA: Diagnosis not present

## 2024-05-01 DIAGNOSIS — H43811 Vitreous degeneration, right eye: Secondary | ICD-10-CM | POA: Diagnosis not present

## 2024-05-01 LAB — HM DIABETES EYE EXAM

## 2024-05-19 DIAGNOSIS — H2513 Age-related nuclear cataract, bilateral: Secondary | ICD-10-CM | POA: Diagnosis not present

## 2024-05-19 DIAGNOSIS — H2511 Age-related nuclear cataract, right eye: Secondary | ICD-10-CM | POA: Diagnosis not present

## 2024-05-28 ENCOUNTER — Encounter: Payer: Self-pay | Admitting: Ophthalmology

## 2024-05-29 NOTE — Anesthesia Preprocedure Evaluation (Addendum)
 Anesthesia Evaluation  Patient identified by MRN, date of birth, ID band Patient awake    Reviewed: Allergy & Precautions, H&P , NPO status , Patient's Chart, lab work & pertinent test results  History of Anesthesia Complications Negative for: history of anesthetic complications  Airway Mallampati: III  TM Distance: >3 FB Neck ROM: full    Dental  (+) Chipped   Pulmonary neg pulmonary ROS, asthma    Pulmonary exam normal        Cardiovascular hypertension, On Medications + CAD, + Past MI, + CABG and +CHF  Normal cardiovascular exam  Echo IMPRESSIONS     1. Left ventricular ejection fraction, by visual estimation, is 50 to  55%. The left ventricle has normal function. Normal left ventricular size.  There is no left ventricular hypertrophy.   2. Left ventricular diastolic parameters are consistent with Grade I  diastolic dysfunction (impaired relaxation).   3. Global right ventricle has normal systolic function.The right  ventricular size is normal. Right vetricular wall thickness was not  assessed.   4. Left atrial size was normal.   5. Right atrial size was normal.   6. The mitral valve is normal in structure. No evidence of mitral valve  regurgitation.   7. The tricuspid valve is normal in structure. Tricuspid valve  regurgitation is not demonstrated.   8. The aortic valve was not well visualized. Aortic valve regurgitation  is mild.   9. The pulmonic valve was not assessed. Pulmonic valve regurgitation not  assessed.     Neuro/Psych   Anxiety     negative neurological ROS  negative psych ROS   GI/Hepatic negative GI ROS, Neg liver ROS,,,  Endo/Other  diabetes    Renal/GU negative Renal ROS  negative genitourinary   Musculoskeletal negative musculoskeletal ROS (+)    Abdominal   Peds negative pediatric ROS (+)  Hematology negative hematology ROS (+)   Anesthesia Other Findings  Seasonal  allergies Family history of adverse reaction to anesthesia, mother PONV and confusion postop High cholesterol Asthma  Type II diabetes mellitus  Tuberculosis  Anxiety CAD (coronary artery disease) Ischemic cardiomyopathy Chronic combined systolic (congestive) and diastolic (congestive) heart failure Myocardial infarction (HCC)    Reproductive/Obstetrics negative OB ROS                              Anesthesia Physical Anesthesia Plan  ASA: 4  Anesthesia Plan: MAC   Post-op Pain Management: Minimal or no pain anticipated   Induction: Intravenous  PONV Risk Score and Plan: 2  Airway Management Planned: Natural Airway and Nasal Cannula  Additional Equipment:   Intra-op Plan:   Post-operative Plan:   Informed Consent: I have reviewed the patients History and Physical, chart, labs and discussed the procedure including the risks, benefits and alternatives for the proposed anesthesia with the patient or authorized representative who has indicated his/her understanding and acceptance.     Dental Advisory Given  Plan Discussed with: Anesthesiologist, CRNA and Surgeon  Anesthesia Plan Comments: (Patient consented for risks of anesthesia including but not limited to:  - adverse reactions to medications - damage to eyes, teeth, lips or other oral mucosa - nerve damage due to positioning  - sore throat or hoarseness - Damage to heart, brain, nerves, lungs, other parts of body or loss of life  Patient voiced understanding and assent.)         Anesthesia Quick Evaluation

## 2024-06-02 NOTE — Discharge Instructions (Signed)

## 2024-06-04 ENCOUNTER — Ambulatory Visit
Admission: RE | Admit: 2024-06-04 | Discharge: 2024-06-04 | Disposition: A | Discharge status: Home / Self Care | Attending: Ophthalmology | Admitting: Ophthalmology

## 2024-06-04 ENCOUNTER — Other Ambulatory Visit: Payer: Self-pay

## 2024-06-04 ENCOUNTER — Ambulatory Visit: Payer: Self-pay | Admitting: Anesthesiology

## 2024-06-04 ENCOUNTER — Encounter
Admission: RE | Disposition: A | Discharge status: Home / Self Care | Payer: Self-pay | Source: Home / Self Care | Attending: Ophthalmology

## 2024-06-04 DIAGNOSIS — H2511 Age-related nuclear cataract, right eye: Secondary | ICD-10-CM | POA: Diagnosis not present

## 2024-06-04 DIAGNOSIS — I2510 Atherosclerotic heart disease of native coronary artery without angina pectoris: Secondary | ICD-10-CM | POA: Insufficient documentation

## 2024-06-04 DIAGNOSIS — I110 Hypertensive heart disease with heart failure: Secondary | ICD-10-CM | POA: Diagnosis not present

## 2024-06-04 DIAGNOSIS — I255 Ischemic cardiomyopathy: Secondary | ICD-10-CM | POA: Diagnosis not present

## 2024-06-04 DIAGNOSIS — E1136 Type 2 diabetes mellitus with diabetic cataract: Secondary | ICD-10-CM | POA: Insufficient documentation

## 2024-06-04 DIAGNOSIS — I10 Essential (primary) hypertension: Secondary | ICD-10-CM | POA: Diagnosis not present

## 2024-06-04 DIAGNOSIS — Z7984 Long term (current) use of oral hypoglycemic drugs: Secondary | ICD-10-CM | POA: Insufficient documentation

## 2024-06-04 DIAGNOSIS — I252 Old myocardial infarction: Secondary | ICD-10-CM | POA: Diagnosis not present

## 2024-06-04 DIAGNOSIS — I5042 Chronic combined systolic (congestive) and diastolic (congestive) heart failure: Secondary | ICD-10-CM | POA: Insufficient documentation

## 2024-06-04 DIAGNOSIS — I25119 Atherosclerotic heart disease of native coronary artery with unspecified angina pectoris: Secondary | ICD-10-CM | POA: Diagnosis not present

## 2024-06-04 HISTORY — PX: CATARACT EXTRACTION W/PHACO: SHX586

## 2024-06-04 HISTORY — DX: Acute myocardial infarction, unspecified: I21.9

## 2024-06-04 LAB — GLUCOSE, CAPILLARY: Glucose-Capillary: 139 mg/dL — ABNORMAL HIGH (ref 70–99)

## 2024-06-04 SURGERY — PHACOEMULSIFICATION, CATARACT, WITH IOL INSERTION
Anesthesia: Monitor Anesthesia Care | Site: Eye | Laterality: Right

## 2024-06-04 MED ORDER — MIDAZOLAM HCL 2 MG/2ML IJ SOLN
INTRAMUSCULAR | Status: DC | PRN
Start: 2024-06-04 — End: 2024-06-04
  Administered 2024-06-04: 2 mg via INTRAVENOUS

## 2024-06-04 MED ORDER — ARMC OPHTHALMIC DILATING DROPS
OPHTHALMIC | Status: AC
  Filled 2024-06-04: qty 0.5

## 2024-06-04 MED ORDER — LIDOCAINE HCL (PF) 2 % IJ SOLN
INTRAOCULAR | Status: DC | PRN
  Administered 2024-06-04: 2 mL

## 2024-06-04 MED ORDER — CEFUROXIME OPHTHALMIC INJECTION 1 MG/0.1 ML
INJECTION | OPHTHALMIC | Status: DC | PRN
  Administered 2024-06-04: 1 mg via INTRACAMERAL

## 2024-06-04 MED ORDER — SIGHTPATH DOSE#1 BSS IO SOLN
INTRAOCULAR | Status: DC | PRN
  Administered 2024-06-04: 60 mL via OPHTHALMIC

## 2024-06-04 MED ORDER — BRIMONIDINE TARTRATE-TIMOLOL 0.2-0.5 % OP SOLN
OPHTHALMIC | Status: DC | PRN
Start: 2024-06-04 — End: 2024-06-04
  Administered 2024-06-04: 1 [drp] via OPHTHALMIC

## 2024-06-04 MED ORDER — FENTANYL CITRATE (PF) 100 MCG/2ML IJ SOLN
INTRAMUSCULAR | Status: AC
  Filled 2024-06-04: qty 2

## 2024-06-04 MED ORDER — SIGHTPATH DOSE#1 NA HYALUR & NA CHOND-NA HYALUR IO KIT
PACK | INTRAOCULAR | Status: DC | PRN
  Administered 2024-06-04: 1 via OPHTHALMIC

## 2024-06-04 MED ORDER — MIDAZOLAM HCL 2 MG/2ML IJ SOLN
INTRAMUSCULAR | Status: AC
Start: 2024-06-04 — End: 2024-06-04
  Filled 2024-06-04: qty 2

## 2024-06-04 MED ORDER — TETRACAINE HCL 0.5 % OP SOLN
OPHTHALMIC | Status: AC
  Filled 2024-06-04: qty 4

## 2024-06-04 MED ORDER — TETRACAINE HCL 0.5 % OP SOLN
1.0000 [drp] | OPHTHALMIC | Status: DC | PRN
  Administered 2024-06-04 (×3): 1 [drp] via OPHTHALMIC

## 2024-06-04 MED ORDER — SIGHTPATH DOSE#1 BSS IO SOLN
INTRAOCULAR | Status: DC | PRN
  Administered 2024-06-04: 15 mL via INTRAOCULAR

## 2024-06-04 MED ORDER — ARMC OPHTHALMIC DILATING DROPS
1.0000 | OPHTHALMIC | Status: DC | PRN
Start: 2024-06-04 — End: 2024-06-04
  Administered 2024-06-04 (×3): 1 via OPHTHALMIC

## 2024-06-04 MED ORDER — FENTANYL CITRATE (PF) 100 MCG/2ML IJ SOLN
INTRAMUSCULAR | Status: DC | PRN
  Administered 2024-06-04: 50 ug via INTRAVENOUS

## 2024-06-04 SURGICAL SUPPLY — 8 items
FEE CATARACT SUITE SIGHTPATH (MISCELLANEOUS) ×1 IMPLANT
GLOVE BIOGEL PI IND STRL 8 (GLOVE) ×1 IMPLANT
GLOVE SURG LX STRL 7.5 STRW (GLOVE) ×1 IMPLANT
GLOVE SURG SYN 6.5 PF PI BL (GLOVE) ×1 IMPLANT
LENS IOL TECNIS EYHANCE 14.5 (Intraocular Lens) IMPLANT
NDL FILTER BLUNT 18X1 1/2 (NEEDLE) ×1 IMPLANT
NEEDLE FILTER BLUNT 18X1 1/2 (NEEDLE) ×1 IMPLANT
SYR 3ML LL SCALE MARK (SYRINGE) ×1 IMPLANT

## 2024-06-04 NOTE — Anesthesia Postprocedure Evaluation (Signed)
 Anesthesia Post Note  Patient: Olivia Roth  Procedure(s) Performed: PHACOEMULSIFICATION, CATARACT, WITH IOL INSERTION 7.31 00:44.5 (Right: Eye)  Patient location during evaluation: PACU Anesthesia Type: MAC Level of consciousness: awake and alert Pain management: pain level controlled Vital Signs Assessment: post-procedure vital signs reviewed and stable Respiratory status: spontaneous breathing, nonlabored ventilation, respiratory function stable and patient connected to nasal cannula oxygen Cardiovascular status: stable and blood pressure returned to baseline Postop Assessment: no apparent nausea or vomiting Anesthetic complications: no   No notable events documented.   Last Vitals:  Vitals:   06/04/24 1106 06/04/24 1111  BP: (!) 105/58 (!) 116/55  Pulse: 67 (!) 59  Resp: 16 11  Temp: 36.5 C 36.5 C  SpO2: 99% 97%    Last Pain:  Vitals:   06/04/24 1111  TempSrc:   PainSc: 0-No pain                 Olivia Roth

## 2024-06-04 NOTE — H&P (Signed)
 [facility]   Primary Care Physician:  [name], DO Ophthalmologist: Dr. Dene Etienne  Pre-Procedure History & Physical: HPI:  Olivia Roth is a 68 y.o. female here for ophthalmic surgery.   Past Medical History:  Diagnosis Date   Anxiety    Asthma    In cold weather (08/21/2018)   CAD (coronary artery disease)    a. 08/2018 NSTEMI/Cath: LM 50d,LAD 80ost/p, 18m 70d, LCX 90ost, OM1 50, OM3 70, OM4 80, LPDA small, RCA  99p/d, EF 30-35%; c. 08/2018 CABG x 4: LIMA->LAD, VG->RI, VG->OM, VG->RCA.   Chronic combined systolic (congestive) and diastolic (congestive) heart failure (HCC)    a. 08/2018 Echo: EF 40-45%, mid-apicalanteroseptal and apical HK. Gr2 DD. Mild AI; b. 08/2018 intraop TEE: EF 45-50%, antsept, infsept HK. Mild AI. Mildly dil Ao root. Trace MR.   Family history of adverse reaction to anesthesia    mother PONV; talked crazy/loud after anesthesia (08/21/2018)   High cholesterol    Ischemic cardiomyopathy    a. 08/2018 Echo: EF 40-45%; b. 08/2018 TEE: EF 45-50%.   Myocardial infarction (HCC)    2019   Seasonal allergies    Tuberculosis ~ 1960   Type II diabetes mellitus (HCC)    UTI (lower urinary tract infection)    once; before hysterectomy (08/21/2018)    Past Surgical History:  Procedure Laterality Date   CARDIAC CATHETERIZATION     CORONARY ARTERY BYPASS GRAFT N/A 08/26/2018   Procedure: CORONARY ARTERY BYPASS GRAFTING (CABG) times four using left internal mammary artery and right leg saphenous vein grafts;  Surgeon: Fleeta Hanford Coy, MD;  Location: [facility] OR;  Service: Open Heart Surgery;  Laterality: N/A;   GANGLION CYST EXCISION Left ~ 1960   neck   LEFT HEART CATH AND CORONARY ANGIOGRAPHY N/A 08/21/2018   Procedure: LEFT HEART CATH AND CORONARY ANGIOGRAPHY;  Surgeon: Mady Bruckner, MD;  Location: [facility] INVASIVE CV LAB;  Service: Cardiovascular;  Laterality: N/A;   TEE WITHOUT CARDIOVERSION N/A 08/26/2018   Procedure:  TRANSESOPHAGEAL ECHOCARDIOGRAM (TEE);  Surgeon: Fleeta Hanford, Coy, MD;  Location: [facility] OR;  Service: Open Heart Surgery;  Laterality: N/A;   TOOTH EXTRACTION     TOTAL ABDOMINAL HYSTERECTOMY  2006    Prior to Admission medications   Medication Sig Start Date End Date Taking? Authorizing Provider  aspirin  EC 81 MG tablet Take 1 tablet (81 mg total) by mouth daily. 09/18/18  Yes Vivienne Bruckner Ingle, NP  atorvastatin  (LIPITOR ) 80 MG tablet TAKE 1 TABLET DAILY 04/16/24  Yes End, Bruckner, MD  metFORMIN  (GLUCOPHAGE ) 500 MG tablet TAKE 2 TABLETS IN THE MORNING AND 1 TABLET IN THE EVENING WITH MEALS 08/14/23  Yes Karamalegos, Marsa PARAS, DO  metoprolol  tartrate (LOPRESSOR ) 25 MG tablet TAKE 1 TABLET TWICE A DAY 04/16/24  Yes End, Bruckner, MD  acetaminophen  (TYLENOL ) 500 MG tablet Take 2 tablets (1,000 mg total) by mouth every 6 (six) hours as needed. Patient not taking: Reported on 05/28/2024 08/31/18   Barrett, Rocky SAUNDERS, PA-C  albuterol  (VENTOLIN  HFA) 108 (90 Base) MCG/ACT inhaler Inhale 2 puffs into the lungs every 6 (six) hours as needed for wheezing or shortness of breath. Patient not taking: Reported on 05/28/2024 01/03/23   [name], DO  blood glucose meter kit and supplies KIT Dispense based on patient and insurance preference. two times daily as directed. For E11.65 09/13/18   [name], DO  fluticasone  (FLONASE ) 50 MCG/ACT nasal spray Place 2 sprays into both nostrils daily. Use  for 4-6 weeks then stop and use seasonally or as needed. Patient not taking: Reported on 05/28/2024 01/03/23   [name], DO  glucose blood test strip Use as instructed 09/13/18   [name], DO  Lancets [facility] ULTRASOFT) lancets TEST TWICE DAILY 12/26/18   Nyle Tinnie Fuller, NP    Allergies as of 05/06/2024 - Review Complete 12/28/2023  Allergen Reaction Noted   Shellfish allergy Hives 08/27/2014   Augmentin  [amoxicillin -pot clavulanate] Diarrhea  01/03/2023   Avocado  10/05/2017   Advair diskus [fluticasone -salmeterol] Palpitations 06/16/2013   Cortisone Other (See Comments) 06/16/2013   Pecan nut (diagnostic) Itching 09/10/2017   Garnell gums allergy] Rash 10/05/2017    Family History  Problem Relation Age of Onset   Cancer Sister    Heart disease Neg Hx     Social History   Socioeconomic History   Marital status: Married    Spouse name: Not on file   Number of children: 2   Years of education: 18   Highest education level: Not on file  Occupational History   Occupation: Set designer: [employer]    Comment: Clinical cytogeneticist  Tobacco Use   Smoking status: Never   Smokeless tobacco: Never  Vaping Use   Vaping status: Never Used  Substance and Sexual Activity   Alcohol use: Not Currently    Comment: socially   Drug use: Never   Sexual activity: Not on file  Other Topics Concern   Not on file  Social History Narrative   Not on file   Social Drivers of Health   Financial Resource Strain: Not on file  Food Insecurity: Not on file  Transportation Needs: Not on file  Physical Activity: Not on file  Stress: Not on file  Social Connections: Not on file  Intimate Partner Violence: Not on file    Review of Systems: See HPI, otherwise negative ROS  Physical Exam: BP (!) 187/64   Pulse 67   Temp (!) 97 F (36.1 C) (Temporal)   Resp 10   Ht 5' 5 (1.651 m)   Wt 75.8 kg   SpO2 99%   BMI 27.79 kg/m  General:   Alert,  pleasant and cooperative in NAD Head:  Normocephalic and atraumatic. Lungs:  Clear to auscultation.    Heart:  Regular rate and rhythm.   Impression/Plan: Olivia Roth is here for ophthalmic surgery.  Risks, benefits, limitations, and alternatives regarding ophthalmic surgery have been reviewed with the patient.  Questions have been answered.  All parties agreeable.   MITTIE GASKIN, MD  06/04/2024, 10:06 AM

## 2024-06-04 NOTE — Op Note (Signed)
 LOCATION:  [facility]   PREOPERATIVE DIAGNOSIS:    Nuclear sclerotic cataract right eye. H25.11   POSTOPERATIVE DIAGNOSIS:  Nuclear sclerotic cataract right eye.     PROCEDURE:  Phacoemusification with posterior chamber intraocular lens placement of the right eye   ULTRASOUND TIME: Procedure(s): PHACOEMULSIFICATION, CATARACT, WITH IOL INSERTION 7.31 00:44.5 (Right)  LENS:   Implant Name Type Inv. Item Serial No. Manufacturer Lot No. LRB No. Used Action  LENS IOL TECNIS EYHANCE 14.5 - [id number] Intraocular Lens LENS IOL TECNIS EYHANCE 14.5 [id number] [employer]  Right 1 Implanted         SURGEON:  Dene FABIENE Etienne, MD   ANESTHESIA:  Topical with tetracaine  drops and 2% Xylocaine  jelly, augmented with 1% preservative-free intracameral lidocaine .    COMPLICATIONS:  None.   DESCRIPTION OF PROCEDURE:  The patient was identified in the holding room and transported to the operating room and placed in the supine position under the operating microscope.  The right eye was identified as the operative eye and it was prepped and draped in the usual sterile ophthalmic fashion.   A 1 millimeter clear-corneal paracentesis was made at the 12:00 position.  0.5 ml of preservative-free 1% lidocaine  was injected into the anterior chamber. The anterior chamber was filled with Viscoat viscoelastic.  A 2.4 millimeter keratome was used to make a near-clear corneal incision at the 9:00 position.  A curvilinear capsulorrhexis was made with a cystotome and capsulorrhexis forceps.  Balanced salt solution was used to hydrodissect and hydrodelineate the nucleus.   Phacoemulsification was then used in stop and chop fashion to remove the lens nucleus and epinucleus.  The remaining cortex was then removed using the irrigation and aspiration handpiece. Provisc was then placed into the capsular bag to distend it for lens placement.  A lens was then injected into the capsular bag.  The remaining  viscoelastic was aspirated.   Wounds were hydrated with balanced salt solution.  The anterior chamber was inflated to a physiologic pressure with balanced salt solution.  No wound leaks were noted. Cefuroxime  0.1 ml of a 10mg /ml solution was injected into the anterior chamber for a dose of 1 mg of intracameral antibiotic at the completion of the case.   Timolol  and Brimonidine  drops were applied to the eye.  The patient was taken to the recovery room in stable condition without complications of anesthesia or surgery.   Jeoffrey Eleazer 06/04/2024, 11:04 AM

## 2024-06-04 NOTE — Transfer of Care (Signed)
 Immediate Anesthesia Transfer of Care Note  Patient: Olivia Roth  Procedure(s) Performed: PHACOEMULSIFICATION, CATARACT, WITH IOL INSERTION 7.31 00:44.5 (Right: Eye)  Patient Location: PACU  Anesthesia Type: MAC  Level of Consciousness: awake, alert  and patient cooperative  Airway and Oxygen Therapy: Patient Spontanous Breathing and Patient connected to supplemental oxygen  Post-op Assessment: Post-op Vital signs reviewed, Patient's Cardiovascular Status Stable, Respiratory Function Stable, Patent Airway and No signs of Nausea or vomiting  Post-op Vital Signs: Reviewed and stable  Complications: No notable events documented.

## 2024-06-05 ENCOUNTER — Encounter: Payer: Self-pay | Admitting: Ophthalmology

## 2024-06-11 ENCOUNTER — Encounter: Payer: Self-pay | Admitting: Ophthalmology

## 2024-06-11 NOTE — Anesthesia Preprocedure Evaluation (Addendum)
 Anesthesia Evaluation  Patient identified by MRN, date of birth, ID band Patient awake    Reviewed: Allergy & Precautions, H&P , NPO status , Patient's Chart, lab work & pertinent test results  History of Anesthesia Complications (+) Family history of anesthesia reaction  Airway Mallampati: III  TM Distance: >3 FB Neck ROM: Full    Dental no notable dental hx. (+) Chipped   Pulmonary neg pulmonary ROS, asthma    Pulmonary exam normal breath sounds clear to auscultation       Cardiovascular hypertension, + angina  + CAD, + Past MI and +CHF  negative cardio ROS Normal cardiovascular exam Rhythm:Regular Rate:Normal  08-06-19 Echo IMPRESSIONS       1. Left ventricular ejection fraction, by visual estimation, is 50 to  55%. The left ventricle has normal function. Normal left ventricular size.  There is no left ventricular hypertrophy.   2. Left ventricular diastolic parameters are consistent with Grade I  diastolic dysfunction (impaired relaxation).   3. Global right ventricle has normal systolic function.The right  ventricular size is normal. Right vetricular wall thickness was not  assessed.   4. Left atrial size was normal.   5. Right atrial size was normal.   6. The mitral valve is normal in structure. No evidence of mitral valve  regurgitation.   7. The tricuspid valve is normal in structure. Tricuspid valve  regurgitation is not demonstrated.   8. The aortic valve was not well visualized. Aortic valve regurgitation  is mild.   9. The pulmonic valve was not assessed. Pulmonic valve regurgitation not  assessed.     Neuro/Psych   Anxiety     negative neurological ROS  negative psych ROS   GI/Hepatic negative GI ROS, Neg liver ROS,,,  Endo/Other  negative endocrine ROSdiabetes    Renal/GU negative Renal ROS  negative genitourinary   Musculoskeletal negative musculoskeletal ROS (+)    Abdominal    Peds negative pediatric ROS (+)  Hematology negative hematology ROS (+)   Anesthesia Other Findings Previous cataract 06-04-24  Dr. Chesley  UTI (lower urinary tract infection) Seasonal allergies Family history of adverse reaction to anesthesia High cholesterol Asthma Type II diabetes mellitus (HCC) Tuberculosis Anxiety CAD (coronary artery disease) Ischemic cardiomyopathy Chronic combined systolic (congestive) and diastolic (congestive) heart failure (HCC) Myocardial infarction (HCC) Grade I diastolic dysfunction     Reproductive/Obstetrics negative OB ROS                              Anesthesia Physical Anesthesia Plan  ASA: 4  Anesthesia Plan: MAC   Post-op Pain Management:    Induction: Intravenous  PONV Risk Score and Plan:   Airway Management Planned: Natural Airway and Nasal Cannula  Additional Equipment:   Intra-op Plan:   Post-operative Plan:   Informed Consent: I have reviewed the patients History and Physical, chart, labs and discussed the procedure including the risks, benefits and alternatives for the proposed anesthesia with the patient or authorized representative who has indicated his/her understanding and acceptance.     Dental Advisory Given  Plan Discussed with: Anesthesiologist, CRNA and Surgeon  Anesthesia Plan Comments: (Patient consented for risks of anesthesia including but not limited to:  - adverse reactions to medications - damage to eyes, teeth, lips or other oral mucosa - nerve damage due to positioning  - sore throat or hoarseness - Damage to heart, brain, nerves, lungs, other parts of body or loss of  life  Patient voiced understanding and assent.)         Anesthesia Quick Evaluation

## 2024-06-16 NOTE — Discharge Instructions (Signed)

## 2024-06-18 ENCOUNTER — Encounter
Admission: RE | Disposition: A | Discharge status: Home / Self Care | Payer: Self-pay | Source: Home / Self Care | Attending: Ophthalmology

## 2024-06-18 ENCOUNTER — Encounter: Payer: Self-pay | Admitting: Ophthalmology

## 2024-06-18 ENCOUNTER — Ambulatory Visit: Payer: Self-pay | Admitting: Anesthesiology

## 2024-06-18 ENCOUNTER — Ambulatory Visit
Admission: RE | Admit: 2024-06-18 | Discharge: 2024-06-18 | Disposition: A | Discharge status: Home / Self Care | Attending: Ophthalmology | Admitting: Ophthalmology

## 2024-06-18 ENCOUNTER — Other Ambulatory Visit: Payer: Self-pay

## 2024-06-18 DIAGNOSIS — Z9841 Cataract extraction status, right eye: Secondary | ICD-10-CM | POA: Diagnosis not present

## 2024-06-18 DIAGNOSIS — Z7951 Long term (current) use of inhaled steroids: Secondary | ICD-10-CM | POA: Insufficient documentation

## 2024-06-18 DIAGNOSIS — E1136 Type 2 diabetes mellitus with diabetic cataract: Secondary | ICD-10-CM | POA: Diagnosis not present

## 2024-06-18 DIAGNOSIS — I252 Old myocardial infarction: Secondary | ICD-10-CM | POA: Diagnosis not present

## 2024-06-18 DIAGNOSIS — Z8611 Personal history of tuberculosis: Secondary | ICD-10-CM | POA: Insufficient documentation

## 2024-06-18 DIAGNOSIS — F419 Anxiety disorder, unspecified: Secondary | ICD-10-CM | POA: Insufficient documentation

## 2024-06-18 DIAGNOSIS — H2512 Age-related nuclear cataract, left eye: Secondary | ICD-10-CM | POA: Diagnosis not present

## 2024-06-18 DIAGNOSIS — J45909 Unspecified asthma, uncomplicated: Secondary | ICD-10-CM | POA: Insufficient documentation

## 2024-06-18 DIAGNOSIS — I110 Hypertensive heart disease with heart failure: Secondary | ICD-10-CM | POA: Insufficient documentation

## 2024-06-18 DIAGNOSIS — I25119 Atherosclerotic heart disease of native coronary artery with unspecified angina pectoris: Secondary | ICD-10-CM | POA: Insufficient documentation

## 2024-06-18 DIAGNOSIS — I5042 Chronic combined systolic (congestive) and diastolic (congestive) heart failure: Secondary | ICD-10-CM | POA: Insufficient documentation

## 2024-06-18 DIAGNOSIS — Z7984 Long term (current) use of oral hypoglycemic drugs: Secondary | ICD-10-CM | POA: Diagnosis not present

## 2024-06-18 HISTORY — PX: CATARACT EXTRACTION W/PHACO: SHX586

## 2024-06-18 HISTORY — DX: Other ill-defined heart diseases: I51.89

## 2024-06-18 SURGERY — PHACOEMULSIFICATION, CATARACT, WITH IOL INSERTION
Anesthesia: Monitor Anesthesia Care | Site: Eye | Laterality: Left

## 2024-06-18 MED ORDER — TETRACAINE HCL 0.5 % OP SOLN
OPHTHALMIC | Status: AC
  Filled 2024-06-18: qty 4

## 2024-06-18 MED ORDER — ARMC OPHTHALMIC DILATING DROPS
1.0000 | OPHTHALMIC | Status: DC | PRN
  Administered 2024-06-18 (×3): 1 via OPHTHALMIC

## 2024-06-18 MED ORDER — LIDOCAINE HCL (PF) 2 % IJ SOLN
INTRAOCULAR | Status: DC | PRN
  Administered 2024-06-18: 2 mL

## 2024-06-18 MED ORDER — MIDAZOLAM HCL 2 MG/2ML IJ SOLN
INTRAMUSCULAR | Status: DC | PRN
  Administered 2024-06-18: 2 mg via INTRAVENOUS

## 2024-06-18 MED ORDER — CEFUROXIME OPHTHALMIC INJECTION 1 MG/0.1 ML
INJECTION | OPHTHALMIC | Status: DC | PRN
  Administered 2024-06-18: .1 mL via INTRACAMERAL

## 2024-06-18 MED ORDER — FENTANYL CITRATE (PF) 100 MCG/2ML IJ SOLN
INTRAMUSCULAR | Status: DC | PRN
  Administered 2024-06-18: 50 ug via INTRAVENOUS

## 2024-06-18 MED ORDER — FENTANYL CITRATE (PF) 100 MCG/2ML IJ SOLN
INTRAMUSCULAR | Status: AC
  Filled 2024-06-18: qty 2

## 2024-06-18 MED ORDER — LACTATED RINGERS IV SOLN: INTRAVENOUS | Status: DC

## 2024-06-18 MED ORDER — SIGHTPATH DOSE#1 NA HYALUR & NA CHOND-NA HYALUR IO KIT
PACK | INTRAOCULAR | Status: DC | PRN
  Administered 2024-06-18: 1 via OPHTHALMIC

## 2024-06-18 MED ORDER — ARMC OPHTHALMIC DILATING DROPS
OPHTHALMIC | Status: AC
  Filled 2024-06-18: qty 0.5

## 2024-06-18 MED ORDER — SIGHTPATH DOSE#1 BSS IO SOLN
INTRAOCULAR | Status: DC | PRN
  Administered 2024-06-18: 56 mL via OPHTHALMIC

## 2024-06-18 MED ORDER — BRIMONIDINE TARTRATE-TIMOLOL 0.2-0.5 % OP SOLN
OPHTHALMIC | Status: DC | PRN
  Administered 2024-06-18: 1 [drp] via OPHTHALMIC

## 2024-06-18 MED ORDER — MIDAZOLAM HCL 2 MG/2ML IJ SOLN
INTRAMUSCULAR | Status: AC
  Filled 2024-06-18: qty 2

## 2024-06-18 MED ORDER — TETRACAINE HCL 0.5 % OP SOLN
1.0000 [drp] | OPHTHALMIC | Status: DC | PRN
  Administered 2024-06-18 (×3): 1 [drp] via OPHTHALMIC

## 2024-06-18 MED ORDER — SIGHTPATH DOSE#1 BSS IO SOLN
INTRAOCULAR | Status: DC | PRN
  Administered 2024-06-18: 15 mL via INTRAOCULAR

## 2024-06-18 SURGICAL SUPPLY — 8 items
FEE CATARACT SUITE SIGHTPATH (MISCELLANEOUS) ×1 IMPLANT
GLOVE BIOGEL PI IND STRL 8 (GLOVE) ×1 IMPLANT
GLOVE SURG LX STRL 7.5 STRW (GLOVE) ×1 IMPLANT
GLOVE SURG SYN 6.5 PF PI BL (GLOVE) ×1 IMPLANT
LENS IOL TECNIS EYHANCE 18.0 (Intraocular Lens) IMPLANT
NDL FILTER BLUNT 18X1 1/2 (NEEDLE) ×1 IMPLANT
NEEDLE FILTER BLUNT 18X1 1/2 (NEEDLE) ×1 IMPLANT
SYR 3ML LL SCALE MARK (SYRINGE) ×1 IMPLANT

## 2024-06-18 NOTE — Transfer of Care (Signed)
 Immediate Anesthesia Transfer of Care Note  Patient: Olivia Roth  Procedure(s) Performed: PHACOEMULSIFICATION, CATARACT, WITH IOL INSERTION 7.87 00:58.9 (Left: Eye)  Patient Location: PACU  Anesthesia Type: MAC  Level of Consciousness: awake, alert  and patient cooperative  Airway and Oxygen Therapy: Patient Spontanous Breathing and Patient connected to supplemental oxygen  Post-op Assessment: Post-op Vital signs reviewed, Patient's Cardiovascular Status Stable, Respiratory Function Stable, Patent Airway and No signs of Nausea or vomiting  Post-op Vital Signs: Reviewed and stable  Complications: No notable events documented.

## 2024-06-18 NOTE — H&P (Signed)
 [facility]   Primary Care Physician:  [name], DO Ophthalmologist: Dr. Dene Etienne  Pre-Procedure History & Physical: HPI:  Olivia Roth is a 68 y.o. female here for ophthalmic surgery.   Past Medical History:  Diagnosis Date   Anxiety    Asthma    In cold weather (08/21/2018)   CAD (coronary artery disease)    a. 08/2018 NSTEMI/Cath: LM 50d,LAD 80ost/p, 40m 70d, LCX 90ost, OM1 50, OM3 70, OM4 80, LPDA small, RCA  99p/d, EF 30-35%; c. 08/2018 CABG x 4: LIMA->LAD, VG->RI, VG->OM, VG->RCA.   Chronic combined systolic (congestive) and diastolic (congestive) heart failure (HCC)    a. 08/2018 Echo: EF 40-45%, mid-apicalanteroseptal and apical HK. Gr2 DD. Mild AI; b. 08/2018 intraop TEE: EF 45-50%, antsept, infsept HK. Mild AI. Mildly dil Ao root. Trace MR.   Family history of adverse reaction to anesthesia    mother PONV; talked crazy/loud after anesthesia (08/21/2018)   Grade I diastolic dysfunction    High cholesterol    Ischemic cardiomyopathy    a. 08/2018 Echo: EF 40-45%; b. 08/2018 TEE: EF 45-50%.   Myocardial infarction [facility])    2019   Seasonal allergies    Tuberculosis ~ 1960   Type II diabetes mellitus (HCC)    UTI (lower urinary tract infection)    once; before hysterectomy (08/21/2018)    Past Surgical History:  Procedure Laterality Date   CARDIAC CATHETERIZATION     CATARACT EXTRACTION W/PHACO Right 06/04/2024   Procedure: PHACOEMULSIFICATION, CATARACT, WITH IOL INSERTION 7.31 00:44.5;  Surgeon: Etienne Dene, MD;  Location: [facility];  Service: Ophthalmology;  Laterality: Right;   CORONARY ARTERY BYPASS GRAFT N/A 08/26/2018   Procedure: CORONARY ARTERY BYPASS GRAFTING (CABG) times four using left internal mammary artery and right leg saphenous vein grafts;  Surgeon: Fleeta Hanford Coy, MD;  Location: [facility] OR;  Service: Open Heart Surgery;  Laterality: N/A;   GANGLION CYST EXCISION Left ~ 1960   neck   LEFT HEART  CATH AND CORONARY ANGIOGRAPHY N/A 08/21/2018   Procedure: LEFT HEART CATH AND CORONARY ANGIOGRAPHY;  Surgeon: Mady Bruckner, MD;  Location: [facility] INVASIVE CV LAB;  Service: Cardiovascular;  Laterality: N/A;   TEE WITHOUT CARDIOVERSION N/A 08/26/2018   Procedure: TRANSESOPHAGEAL ECHOCARDIOGRAM (TEE);  Surgeon: Fleeta Hanford, Coy, MD;  Location: [facility] OR;  Service: Open Heart Surgery;  Laterality: N/A;   TOOTH EXTRACTION     TOTAL ABDOMINAL HYSTERECTOMY  2006    Prior to Admission medications   Medication Sig Start Date End Date Taking? Authorizing Provider  acetaminophen  (TYLENOL ) 500 MG tablet Take 2 tablets (1,000 mg total) by mouth every 6 (six) hours as needed. 08/31/18  Yes Barrett, Erin R, PA-C  albuterol  (VENTOLIN  HFA) 108 (90 Base) MCG/ACT inhaler Inhale 2 puffs into the lungs every 6 (six) hours as needed for wheezing or shortness of breath. 01/03/23  Yes Karamalegos, Marsa PARAS, DO  aspirin  EC 81 MG tablet Take 1 tablet (81 mg total) by mouth daily. 09/18/18  Yes Vivienne Bruckner Ingle, NP  atorvastatin  (LIPITOR ) 80 MG tablet TAKE 1 TABLET DAILY 04/16/24  Yes End, Bruckner, MD  blood glucose meter kit and supplies KIT Dispense based on patient and insurance preference. two times daily as directed. For E11.65 09/13/18  Yes Karamalegos, Marsa PARAS, DO  fluticasone  (FLONASE ) 50 MCG/ACT nasal spray Place 2 sprays into both nostrils daily. Use for 4-6 weeks then stop and use seasonally or as needed. 01/03/23  Yes [name], DO  glucose blood test strip Use as instructed 09/13/18  Yes Karamalegos, Marsa PARAS, DO  Lancets (ONETOUCH ULTRASOFT) lancets TEST TWICE DAILY 12/26/18  Yes Nyle Tinnie Fuller, NP  metFORMIN  (GLUCOPHAGE ) 500 MG tablet TAKE 2 TABLETS IN THE MORNING AND 1 TABLET IN THE EVENING WITH MEALS 08/14/23  Yes [name], DO  metoprolol  tartrate (LOPRESSOR ) 25 MG tablet TAKE 1 TABLET TWICE A DAY 04/16/24  Yes End, Lonni, MD    Allergies as of  05/06/2024 - Review Complete 12/28/2023  Allergen Reaction Noted   Shellfish allergy Hives 08/27/2014   Augmentin  [amoxicillin -pot clavulanate] Diarrhea 01/03/2023   Avocado  10/05/2017   Advair diskus [fluticasone -salmeterol] Palpitations 06/16/2013   Cortisone Other (See Comments) 06/16/2013   Pecan nut (diagnostic) Itching 09/10/2017   Salmon [fish allergy] Rash 10/05/2017    Family History  Problem Relation Age of Onset   Cancer Sister    Heart disease Neg Hx     Social History   Socioeconomic History   Marital status: Married    Spouse name: Not on file   Number of children: 2   Years of education: 18   Highest education level: Not on file  Occupational History   Occupation: Set designer: [employer]    Comment: Clinical cytogeneticist  Tobacco Use   Smoking status: Never   Smokeless tobacco: Never  Vaping Use   Vaping status: Never Used  Substance and Sexual Activity   Alcohol use: Not Currently    Comment: socially   Drug use: Never   Sexual activity: Not on file  Other Topics Concern   Not on file  Social History Narrative   Not on file   Social Drivers of Health   Financial Resource Strain: Not on file  Food Insecurity: Not on file  Transportation Needs: Not on file  Physical Activity: Not on file  Stress: Not on file  Social Connections: Not on file  Intimate Partner Violence: Not on file    Review of Systems: See HPI, otherwise negative ROS  Physical Exam: BP (!) 161/62   Pulse 65   Temp (!) 97.5 F (36.4 C) (Temporal)   Resp 18   Wt 75.8 kg   SpO2 99%   BMI 27.79 kg/m  General:   Alert,  pleasant and cooperative in NAD Head:  Normocephalic and atraumatic. Lungs:  Clear to auscultation.    Heart:  Regular rate and rhythm.   Impression/Plan: Olivia Roth is here for ophthalmic surgery.  Risks, benefits, limitations, and alternatives regarding ophthalmic surgery have been reviewed with the patient.  Questions have been  answered.  All parties agreeable.   MITTIE GASKIN, MD  06/18/2024, 12:23 PM

## 2024-06-18 NOTE — Anesthesia Postprocedure Evaluation (Signed)
 Anesthesia Post Note  Patient: MINYON BILLITER  Procedure(s) Performed: PHACOEMULSIFICATION, CATARACT, WITH IOL INSERTION 7.87 00:58.9 (Left: Eye)  Patient location during evaluation: PACU Anesthesia Type: MAC Level of consciousness: awake and alert Pain management: pain level controlled Vital Signs Assessment: post-procedure vital signs reviewed and stable Respiratory status: spontaneous breathing, nonlabored ventilation, respiratory function stable and patient connected to nasal cannula oxygen Cardiovascular status: stable and blood pressure returned to baseline Postop Assessment: no apparent nausea or vomiting Anesthetic complications: no   No notable events documented.   Last Vitals:  Vitals:   06/18/24 1219 06/18/24 1331  BP: (!) 161/62 (!) 103/56  Pulse: 65 65  Resp: 18 14  Temp: (!) 36.4 C (!) 36.2 C  SpO2: 99% 96%    Last Pain:  Vitals:   06/18/24 1331  TempSrc:   PainSc: 0-No pain                 Kshawn Canal C Sanai Frick

## 2024-06-18 NOTE — Op Note (Signed)
 OPERATIVE NOTE  AMARYS SLIWINSKI [id number] 06/18/2024   PREOPERATIVE DIAGNOSIS:  Nuclear sclerotic cataract left eye. H25.12   POSTOPERATIVE DIAGNOSIS:    Nuclear sclerotic cataract left eye.     PROCEDURE:  Phacoemusification with posterior chamber intraocular lens placement of the left eye  Ultrasound time: Procedure(s): PHACOEMULSIFICATION, CATARACT, WITH IOL INSERTION 7.87 00:58.9 (Left)  LENS:   Implant Name Type Inv. Item Serial No. Manufacturer Lot No. LRB No. Used Action  LENS IOL TECNIS EYHANCE 18.0 - [id number] Intraocular Lens LENS IOL TECNIS EYHANCE 18.0 [id number] [employer]  Left 1 Implanted      SURGEON:  Dene FABIENE Etienne, MD   ANESTHESIA:  Topical with tetracaine  drops and 2% Xylocaine  jelly, augmented with 1% preservative-free intracameral lidocaine .    COMPLICATIONS:  None.   DESCRIPTION OF PROCEDURE:  The patient was identified in the holding room and transported to the operating room and placed in the supine position under the operating microscope.  The left eye was identified as the operative eye and it was prepped and draped in the usual sterile ophthalmic fashion.   A 1 millimeter clear-corneal paracentesis was made at the 1:30 position.  0.5 ml of preservative-free 1% lidocaine  was injected into the anterior chamber.  The anterior chamber was filled with Viscoat viscoelastic.  A 2.4 millimeter keratome was used to make a near-clear corneal incision at the 10:30 position.  .  A curvilinear capsulorrhexis was made with a cystotome and capsulorrhexis forceps.  Balanced salt solution was used to hydrodissect and hydrodelineate the nucleus.   Phacoemulsification was then used in stop and chop fashion to remove the lens nucleus and epinucleus.  The remaining cortex was then removed using the irrigation and aspiration handpiece. Provisc was then placed into the capsular bag to distend it for lens placement.  A lens was then injected into the capsular bag.  The  remaining viscoelastic was aspirated.   Wounds were hydrated with balanced salt solution.  The anterior chamber was inflated to a physiologic pressure with balanced salt solution.  No wound leaks were noted. Cefuroxime  0.1 ml of a 10mg /ml solution was injected into the anterior chamber for a dose of 1 mg of intracameral antibiotic at the completion of the case.    Timolol  and Brimonidine  drops were applied to the eye.  The patient was taken to the recovery room in stable condition without complications of anesthesia or surgery.  Aidric Endicott 06/18/2024, 1:30 PM

## 2024-08-05 ENCOUNTER — Other Ambulatory Visit: Payer: Self-pay | Admitting: Family Medicine

## 2024-08-05 DIAGNOSIS — E1159 Type 2 diabetes mellitus with other circulatory complications: Secondary | ICD-10-CM

## 2024-08-06 NOTE — Telephone Encounter (Signed)
 Requested medication (s) are due for refill today: yes  Requested medication (s) are on the active medication list: yes  Last refill:  08/14/23  Future visit scheduled: no  Notes to clinic:  Unable to refill per protocol due to failed labs, no updated results.      Requested Prescriptions  Pending Prescriptions Disp Refills   metFORMIN  (GLUCOPHAGE ) 500 MG tablet [Pharmacy Med Name: METFORMIN  HCL TABS 500MG ] 270 tablet 3    Sig: TAKE 2 TABLETS IN THE MORNING AND 1 TABLET IN THE EVENING WITH MEALS     Endocrinology:  Diabetes - Biguanides Failed - 08/06/2024 12:23 PM      Failed - Cr in normal range and within 360 days    Creat  Date Value Ref Range Status  09/28/2021 0.77 0.50 - 1.05 mg/dL Final   Creatinine, Ser  Date Value Ref Range Status  12/28/2023 1.03 (H) 0.57 - 1.00 mg/dL Final   Creatinine, Urine  Date Value Ref Range Status  09/28/2021 82 20 - 275 mg/dL Final         Failed - HBA1C is between 0 and 7.9 and within 180 days    Hemoglobin A1C  Date Value Ref Range Status  09/28/2021 7.5 (A) 4.0 - 5.6 % Final   Hgb A1c MFr Bld  Date Value Ref Range Status  03/12/2019 6.4 (H) <5.7 % of total Hgb Final    Comment:    For someone without known diabetes, a hemoglobin  A1c value between 5.7% and 6.4% is consistent with prediabetes and should be confirmed with a  follow-up test. . For someone with known diabetes, a value <7% indicates that their diabetes is well controlled. A1c targets should be individualized based on duration of diabetes, age, comorbid conditions, and other considerations. . This assay result is consistent with an increased risk of diabetes. . Currently, no consensus exists regarding use of hemoglobin A1c for diagnosis of diabetes for children. .          Failed - eGFR in normal range and within 360 days    GFR, Est African American  Date Value Ref Range Status  03/05/2019 78 > OR = 60 mL/min/1.62m2 Final   GFR calc Af Amer  Date  Value Ref Range Status  08/18/2020 75 >59 mL/min/1.73 Final    Comment:    **In accordance with recommendations from the NKF-ASN Task force,**   [facility] is in the process of updating its eGFR calculation to the   2021 CKD-EPI creatinine equation that estimates kidney function   without a race variable.    GFR, Est Non African American  Date Value Ref Range Status  03/05/2019 68 > OR = 60 mL/min/1.38m2 Final   GFR, Estimated  Date Value Ref Range Status  06/15/2022 57 (L) >60 mL/min Final    Comment:    (NOTE) Calculated using the CKD-EPI Creatinine Equation (2021)    eGFR  Date Value Ref Range Status  12/28/2023 59 (L) >59 mL/min/1.73 Final         Failed - B12 Level in normal range and within 720 days    No results found for: VITAMINB12       Failed - Valid encounter within last 6 months    Recent Outpatient Visits   None            Failed - CBC within normal limits and completed in the last 12 months    WBC  Date Value Ref Range Status  12/28/2023 5.5  3.4 - 10.8 x10E3/uL Final  09/28/2021 6.0 3.8 - 10.8 Thousand/uL Final   RBC  Date Value Ref Range Status  12/28/2023 4.18 3.77 - 5.28 x10E6/uL Final  09/28/2021 4.27 3.80 - 5.10 Million/uL Final   Hemoglobin  Date Value Ref Range Status  12/28/2023 12.5 11.1 - 15.9 g/dL Final   Total hemoglobin  Date Value Ref Range Status  08/29/2018 9.2 (L) 12.0 - 16.0 g/dL Final   Hematocrit  Date Value Ref Range Status  12/28/2023 38.9 34.0 - 46.6 % Final   MCHC  Date Value Ref Range Status  12/28/2023 32.1 31.5 - 35.7 g/dL Final  87/78/7977 67.5 32.0 - 36.0 g/dL Final   [facility]  Date Value Ref Range Status  12/28/2023 29.9 26.6 - 33.0 pg Final  09/28/2021 30.0 27.0 - 33.0 pg Final   MCV  Date Value Ref Range Status  12/28/2023 93 79 - 97 fL Final   No results found for: PLTCOUNTKUC, LABPLAT, POCPLA RDW  Date Value Ref Range Status  12/28/2023 13.2 11.7 - 15.4 % Final

## 2024-08-19 ENCOUNTER — Encounter: Payer: Self-pay | Admitting: Physician Assistant

## 2024-08-19 ENCOUNTER — Ambulatory Visit: Attending: Physician Assistant | Admitting: Physician Assistant

## 2024-08-19 VITALS — BP 128/84 | HR 73 | Ht 61.0 in | Wt 165.8 lb

## 2024-08-19 DIAGNOSIS — R0609 Other forms of dyspnea: Secondary | ICD-10-CM | POA: Diagnosis not present

## 2024-08-19 DIAGNOSIS — I6523 Occlusion and stenosis of bilateral carotid arteries: Secondary | ICD-10-CM

## 2024-08-19 DIAGNOSIS — I255 Ischemic cardiomyopathy: Secondary | ICD-10-CM

## 2024-08-19 DIAGNOSIS — E785 Hyperlipidemia, unspecified: Secondary | ICD-10-CM

## 2024-08-19 DIAGNOSIS — I25118 Atherosclerotic heart disease of native coronary artery with other forms of angina pectoris: Secondary | ICD-10-CM

## 2024-08-19 DIAGNOSIS — I10 Essential (primary) hypertension: Secondary | ICD-10-CM

## 2024-08-19 DIAGNOSIS — I5020 Unspecified systolic (congestive) heart failure: Secondary | ICD-10-CM | POA: Diagnosis not present

## 2024-08-19 NOTE — Patient Instructions (Addendum)
 Medication Instructions:  Your physician recommends that you continue on your current medications as directed. Please refer to the Current Medication list given to you today.   *If you need a refill on your cardiac medications before your next appointment, please call your pharmacy*  Lab Work: None ordered at this time   Testing/Procedures: Your physician has requested that you have a carotid duplex. This test is an ultrasound of the carotid arteries in your neck. It looks at blood flow through these arteries that supply the brain with blood.   Allow one hour for this exam.  There are no restrictions or special instructions.  This will take place at [address] ([facility]) [phone], [place] [address]   Your physician has requested that you have an echocardiogram. Echocardiography is a painless test that uses sound waves to create images of your heart. It provides your doctor with information about the size and shape of your heart and how well your heart's chambers and valves are working.   You may receive an ultrasound enhancing agent through an IV if needed to better visualize your heart during the echo. This procedure takes approximately one hour.  There are no restrictions for this procedure.  This will take place at [address] [facility]) [address], [place] [address]  Please note: We ask at that you not bring children with you during ultrasound (echo/ vascular) testing. Due to room size and safety concerns, children are not allowed in the ultrasound rooms during exams. Our front office staff cannot provide observation of children in our lobby area while testing is being conducted. An adult accompanying a patient to their appointment will only be allowed in the ultrasound room at the discretion of the ultrasound technician under special circumstances. We apologize for any inconvenience.      Please report to Radiology at the [facility] Main  Entrance 30 minutes early for your test.  [address] [place], [place] [address]                         OR   Please report to Radiology at [facility] Main Entrance, medical mall, 30 mins prior to your test.  [address]  [place], [place]  How to Prepare for Your Cardiac PET/CT Stress Test:  Nothing to eat or drink, except water, 3 hours prior to arrival time.  NO caffeine/decaffeinated products, or chocolate 12 hours prior to arrival. (Please note decaffeinated beverages (teas/coffees) still contain caffeine).  If you have caffeine within 12 hours prior, the test will need to be rescheduled.  Medication instructions: Do not take erectile dysfunction medications for 72 hours prior to test (sildenafil, tadalafil) Do not take nitrates (isosorbide mononitrate, Ranexa) the day before or day of test Do not take tamsulosin the day before or morning of test Hold theophylline containing medications for 12 hours. Hold Dipyridamole 48 hours prior to the test.  Diabetic Preparation: If able to eat breakfast prior to 3 hour fasting, you may take all medications, including your insulin . Do not worry if you miss your breakfast dose of insulin  - start at your next meal. If you do not eat prior to 3 hour fast-Hold all diabetes (oral and insulin ) medications. Patients who wear a continuous glucose monitor MUST remove the device prior to scanning.  You may take your remaining medications with water.  NO perfume, cologne or lotion on chest or abdomen area.  FEMALES - Please avoid wearing dresses to this appointment.  Total time is 1 to 2 hours; you may want to bring reading material for the waiting time.  IF YOU THINK YOU MAY BE PREGNANT, OR ARE NURSING PLEASE INFORM THE TECHNOLOGIST.  In preparation for your appointment, medication and supplies will be purchased.  Appointment availability is limited, so if you need to cancel or reschedule, please call the  Radiology Department Scheduler at [phone] 24 hours in advance to avoid a cancellation fee of $100.00  What to Expect When you Arrive:  Once you arrive and check in for your appointment, you will be taken to a preparation room within the Radiology Department.  A technologist or Nurse will obtain your medical history, verify that you are correctly prepped for the exam, and explain the procedure.  Afterwards, an IV will be started in your arm and electrodes will be placed on your skin for EKG monitoring during the stress portion of the exam. Then you will be escorted to the PET/CT scanner.  There, staff will get you positioned on the scanner and obtain a blood pressure and EKG.  During the exam, you will continue to be connected to the EKG and blood pressure machines.  A small, safe amount of a radioactive tracer will be injected in your IV to obtain a series of pictures of your heart along with an injection of a stress agent.    After your Exam:  It is recommended that you eat a meal and drink a caffeinated beverage to counter act any effects of the stress agent.  Drink plenty of fluids for the remainder of the day and urinate frequently for the first couple of hours after the exam.  Your doctor will inform you of your test results within 7-10 business days.  For more information and frequently asked questions, please visit our website: [email or web address]  For questions about your test or how to prepare for your test, please call: Cardiac Imaging Nurse Navigators Office: [phone]   Follow-Up: At [facility], you and your health needs are our priority.  As part of our continuing mission to provide you with exceptional heart care, our providers are all part of one team.  This team includes your primary Cardiologist (physician) and Advanced Practice Providers or APPs (Physician Assistants and Nurse Practitioners) who all work together to provide you with the care you  need, when you need it.  Your next appointment:   2 month(s)  Provider:   You may see Lonni Hanson, MD or Bernardino Bring, PA-C   We recommend signing up for the patient portal called MyChart.  Sign up information is provided on this After Visit Summary.  MyChart is used to connect with patients for Virtual Visits (Telemedicine).  Patients are able to view lab/test results, encounter notes, upcoming appointments, etc.  Non-urgent messages can be sent to your provider as well.   To learn more about what you can do with MyChart, go to [email or web address].

## 2024-08-19 NOTE — Progress Notes (Signed)
 Cardiology Office Note    Date:  08/19/2024   ID:  Olivia Roth, Olivia Roth 10/02/1956, MRN [id number]  PCP:  [name], DO  Cardiologist:  Lonni Hanson, MD  Electrophysiologist:  None   Chief Complaint: Follow-up  History of Present Illness:   Olivia Roth is a 68 y.o. 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   She was admitted to the hospital in 08/2018 with an NSTEMI.  Echo showed an EF of 40 to 45%.  LHC showed severe multivessel CAD with an EF of 30 to 35%.  She was transferred to [facility] and underwent four-vessel CABG with relatively uncomplicated postoperative course.  Pre-CABG imaging showed bilateral ICA stenosis of 1 to 39% with normal ABIs bilaterally.  Intraoperative TEE showed an EF of 45 to 50% with hypokinesis of the anteroseptal and inferoseptal wall.  Most recent echo from 07/2019 demonstrated an EF of 50 to 55%, grade 1 diastolic dysfunction, normal RV systolic function and ventricular cavity size, and no significant valvular abnormalities.  She was last seen in the office in 12/2023 and continued to do well from a cardiac perspective, noting left-sided neck discomfort if she walked up an incline that had been present for many years and predated her MI/CABG.  No changes in pharmacotherapy were pursued at that time.  She has not required ischemic evaluation since undergoing CABG.  She comes in accompanied by her husband today.  She reports a 1 week history of exertional shortness of breath with prolonged ambulation.  No frank chest pain.  If she ambulates for short distances that she is completely asymptomatic.  She also notes a longstanding left-sided neck tightness that has been present since prior to her MI and is unchanged.  No lower extremity  swelling, abdominal distention, or orthopnea.  Her weight is down 4 pounds today when compared to her visit in 12/2023.  No dizziness, presyncope, or syncope.   Labs independently reviewed: 12/2023 - BUN 21, serum creatinine 1.03, potassium 4.6, BUN 4.4, AST/ALT normal, TC 138, TG 234, HDL 40, LDL 60, Hgb 12.5, PLT 230 87/7977 - TSH normal   Past Medical History:  Diagnosis Date   Anxiety    Asthma    In cold weather (08/21/2018)   CAD (coronary artery disease)    a. 08/2018 NSTEMI/Cath: LM 50d,LAD 80ost/p, 77m 70d, LCX 90ost, OM1 50, OM3 70, OM4 80, LPDA small, RCA  99p/d, EF 30-35%; c. 08/2018 CABG x 4: LIMA->LAD, VG->RI, VG->OM, VG->RCA.   Chronic combined systolic (congestive) and diastolic (congestive) heart failure (HCC)    a. 08/2018 Echo: EF 40-45%, mid-apicalanteroseptal and apical HK. Gr2 DD. Mild AI; b. 08/2018 intraop TEE: EF 45-50%, antsept, infsept HK. Mild AI. Mildly dil Ao root. Trace MR.   Family history of adverse reaction to anesthesia    mother PONV; talked crazy/loud after anesthesia (08/21/2018)   Grade I diastolic dysfunction    High cholesterol    Ischemic cardiomyopathy    a. 08/2018 Echo: EF 40-45%; b. 08/2018 TEE: EF 45-50%.   Myocardial infarction (HCC)    2019   Seasonal allergies    Tuberculosis ~ 1960   Type II diabetes mellitus (HCC)    UTI (lower urinary tract infection)    once; before  hysterectomy (08/21/2018)    Past Surgical History:  Procedure Laterality Date   CARDIAC CATHETERIZATION     CATARACT EXTRACTION W/PHACO Right 06/04/2024   Procedure: PHACOEMULSIFICATION, CATARACT, WITH IOL INSERTION 7.31 00:44.5;  Surgeon: Mittie Gaskin, MD;  Location: [facility];  Service: Ophthalmology;  Laterality: Right;   CATARACT EXTRACTION W/PHACO Left 06/18/2024   Procedure: PHACOEMULSIFICATION, CATARACT, WITH IOL INSERTION 7.87 00:58.9;  Surgeon: Mittie Gaskin, MD;  Location: [facility];  Service: Ophthalmology;   Laterality: Left;   CORONARY ARTERY BYPASS GRAFT N/A 08/26/2018   Procedure: CORONARY ARTERY BYPASS GRAFTING (CABG) times four using left internal mammary artery and right leg saphenous vein grafts;  Surgeon: Fleeta Hanford Coy, MD;  Location: [facility] OR;  Service: Open Heart Surgery;  Laterality: N/A;   GANGLION CYST EXCISION Left ~ 1960   neck   LEFT HEART CATH AND CORONARY ANGIOGRAPHY N/A 08/21/2018   Procedure: LEFT HEART CATH AND CORONARY ANGIOGRAPHY;  Surgeon: Mady Bruckner, MD;  Location: [facility] INVASIVE CV LAB;  Service: Cardiovascular;  Laterality: N/A;   TEE WITHOUT CARDIOVERSION N/A 08/26/2018   Procedure: TRANSESOPHAGEAL ECHOCARDIOGRAM (TEE);  Surgeon: Fleeta Hanford, Coy, MD;  Location: [facility] OR;  Service: Open Heart Surgery;  Laterality: N/A;   TOOTH EXTRACTION     TOTAL ABDOMINAL HYSTERECTOMY  2006    Current Medications: Current Meds  Medication Sig   acetaminophen  (TYLENOL ) 500 MG tablet Take 2 tablets (1,000 mg total) by mouth every 6 (six) hours as needed.   albuterol  (VENTOLIN  HFA) 108 (90 Base) MCG/ACT inhaler Inhale 2 puffs into the lungs every 6 (six) hours as needed for wheezing or shortness of breath.   aspirin  EC 81 MG tablet Take 1 tablet (81 mg total) by mouth daily.   atorvastatin  (LIPITOR ) 80 MG tablet TAKE 1 TABLET DAILY   blood glucose meter kit and supplies KIT Dispense based on patient and insurance preference. two times daily as directed. For E11.65   fluticasone  (FLONASE ) 50 MCG/ACT nasal spray Place 2 sprays into both nostrils daily. Use for 4-6 weeks then stop and use seasonally or as needed.   glucose blood test strip Use as instructed   Lancets (ONETOUCH ULTRASOFT) lancets TEST TWICE DAILY   metFORMIN  (GLUCOPHAGE ) 500 MG tablet TAKE 2 TABLETS IN THE MORNING AND 1 TABLET IN THE EVENING WITH MEALS   metoprolol  tartrate (LOPRESSOR ) 25 MG tablet TAKE 1 TABLET TWICE A DAY    Allergies:   Shellfish allergy, Augmentin  [amoxicillin -pot clavulanate], Avocado, Other,  Advair diskus [fluticasone -salmeterol], Cortisone, Pecan nut (diagnostic), and Salmon [fish allergy]   Social History   Socioeconomic History   Marital status: Married    Spouse name: Not on file   Number of children: 2   Years of education: 18   Highest education level: Not on file  Occupational History   Occupation: Set Designer: [employer]    Comment: Clinical Cytogeneticist  Tobacco Use   Smoking status: Never   Smokeless tobacco: Never  Vaping Use   Vaping status: Never Used  Substance and Sexual Activity   Alcohol use: Not Currently    Comment: socially   Drug use: Never   Sexual activity: Not on file  Other Topics Concern   Not on file  Social History Narrative   Not on file   Social Drivers of Health   Financial Resource Strain: Not on file  Food Insecurity: Not on file  Transportation Needs: Not on file  Physical Activity: Not on file  Stress: Not  on file  Social Connections: Not on file     Family History:  The patient's family history includes Cancer in her sister. There is no history of Heart disease.  ROS:   12-point review of systems is negative unless otherwise noted in the HPI.   EKGs/Labs/Other Studies Reviewed:    Studies reviewed were summarized above. The additional studies were reviewed today:  Limited echo 08/06/2019: 1. Left ventricular ejection fraction, by visual estimation, is 50 to  55%. The left ventricle has normal function. Normal left ventricular size.  There is no left ventricular hypertrophy.   2. Left ventricular diastolic parameters are consistent with Grade I  diastolic dysfunction (impaired relaxation).   3. Global right ventricle has normal systolic function.The right  ventricular size is normal. Right vetricular wall thickness was not  assessed.   4. Left atrial size was normal.   5. Right atrial size was normal.   6. The mitral valve is normal in structure. No evidence of mitral valve  regurgitation.   7.  The tricuspid valve is normal in structure. Tricuspid valve  regurgitation is not demonstrated.   8. The aortic valve was not well visualized. Aortic valve regurgitation  is mild.   9. The pulmonic valve was not assessed. Pulmonic valve regurgitation not  assessed. __________   Intraoperative TEE 08/26/2018:  Left Atrium: Normal size, no evidence of LAA thrombus with PWD  velocities > 40 mm/s in LAA   Septum: No Patent Foramen Ovale present, no evidence of PFO or ASD on  color flow doppler   Left Ventricle: Normal chamber size and reduced systolic function, LVEF  45-50%, hypokinesis of the anteroseptal and inferoseptal wall.   Aortic valve: Tricuspid valve noted, valve leaflets demonstrate normal  motion with minimal sclerosis or calcification. Mild regurgitation.   Aorta: The aortic root and ascending aorta are mildly dilated.   Mitral valve: Trace regurgitation with a central jet.   Right Atrium: Normal size, PA catheter noted traversing RA   Right ventricle: Normal cavity size, wall thickness and ejection  fraction.   Pulmonic Valve: No insufficiency seen on color doppler.   Aorta: Grade 2 calcification of descending aorta and aortic arch, no  evidence of dissection.   Pericardium: no significant pericardial effusion. __________   Pre-CABG carotid and peripheral arterial ultrasound 08/23/2018: Summary:  Right Carotid: Velocities in the right ICA are consistent with a 1-39%  stenosis.   Left Carotid: Velocities in the left ICA are consistent with a 1-39%  stenosis.  Vertebrals: Bilateral vertebral arteries demonstrate antegrade flow.   Right ABI: Resting right ankle-brachial index is within normal range. No  evidence of significant right lower extremity arterial disease.  Left ABI: Resting left ankle-brachial index is within normal range. No  evidence of significant left lower extremity arterial disease.  __________   2D echo 08/22/2018: - Left ventricle:  Systolic function was mildly to moderately    reduced. The estimated ejection fraction was in the range of 40%    to 45%. Moderate hypokinesis of the mid-apicalanteroseptal and    apical myocardium. Features are consistent with a pseudonormal    left ventricular filling pattern, with concomitant abnormal    relaxation and increased filling pressure (grade 2 diastolic    dysfunction).  - Aortic valve: There was mild regurgitation. __________   LHC 08/21/2018: Conclusions: Severe three-vessel coronary artery disease, including 50% distal LMCA, 80% ostial/proximal LAD, and 90% ostial LCx, and 99% mid RCA stenoses. Moderately to severely reduced left ventricular  systolic function (LVEF 30-35%) with global hypokinesis and mid/apical akinesis. Moderately elevated left ventricular filling pressure.   Recommendations: Transfer to [facility] for surgical consultation for CABG. Gentle diuresis. Restart heparin  infusion 2 hours after TR band deflation. Aggressive secondary prevention and medical therapy.  Will continue carvedilol  3.125 mg BID and increase atorvastatin  to 80 mg daily.   Recommend uninterrupted dual antiplatelet therapy with aspirin  81mg  daily and clopidogrel  75mg  daily for a minimum of 12 months (ACS - Class I recommendation).  I will defer starting clopidogrel  pending cardiac surgery consultation.   EKG:  EKG is ordered today.  The EKG ordered today demonstrates NSR, 73 bpm, prior inferior infarct, no acute ST-T changes, consistent with prior tracing  Recent Labs: 12/28/2023: ALT 13; BUN 21; Creatinine, Ser 1.03; Hemoglobin 12.5; Platelets 230; Potassium 4.6; Sodium 141  Recent Lipid Panel    Component Value Date/Time   CHOL 138 12/28/2023 0826   TRIG 234 (H) 12/28/2023 0826   HDL 40 12/28/2023 0826   CHOLHDL 3.5 12/28/2023 0826   CHOLHDL 3.8 06/15/2022 1533   VLDL 48 (H) 06/15/2022 1533   LDLCALC 60 12/28/2023 0826   LDLCALC  07/04/2017 0902     Comment:     . LDL  cholesterol not calculated. Triglyceride levels greater than 400 mg/dL invalidate calculated LDL results. . Reference range: <100 . Desirable range <100 mg/dL for primary prevention;   <70 mg/dL for patients with CHD or diabetic patients  with > or = 2 CHD risk factors. SABRA LDL-C is now calculated using the Martin-Hopkins  calculation, which is a validated novel method providing  better accuracy than the Friedewald equation in the  estimation of LDL-C.  Gladis APPLETHWAITE et al. SANDREA. 7986;689(80): 2061-2068  (http://education.QuestDiagnostics.com/faq/FAQ164)    LDLDIRECT 72 06/15/2022 1533    PHYSICAL EXAM:    VS:  BP 128/84   Pulse 73   Ht 5' 1 (1.549 m)   Wt 165 lb 12.8 oz (75.2 kg)   SpO2 99%   BMI 31.33 kg/m   BMI: Body mass index is 31.33 kg/m.  Physical Exam Vitals reviewed.  Constitutional:      Appearance: She is well-developed.  HENT:     Head: Normocephalic and atraumatic.  Eyes:     General:        Right eye: No discharge.        Left eye: No discharge.  Neck:     Vascular: No JVD.  Cardiovascular:     Rate and Rhythm: Normal rate and regular rhythm.     Pulses:          Posterior tibial pulses are 2+ on the right side and 2+ on the left side.     Heart sounds: Normal heart sounds, S1 normal and S2 normal. Heart sounds not distant. No midsystolic click and no opening snap. No murmur heard.    No friction rub.  Pulmonary:     Effort: Pulmonary effort is normal. No respiratory distress.     Breath sounds: Normal breath sounds. No decreased breath sounds, wheezing, rhonchi or rales.  Musculoskeletal:     Cervical back: Normal range of motion.     Right lower leg: No edema.     Left lower leg: No edema.  Skin:    General: Skin is warm and dry.     Nails: There is no clubbing.  Neurological:     Mental Status: She is alert and oriented to person, place, and time.  Psychiatric:  Speech: Speech normal.        Behavior: Behavior normal.        Thought  Content: Thought content normal.        Judgment: Judgment normal.     Wt Readings from Last 3 Encounters:  08/19/24 165 lb 12.8 oz (75.2 kg)  06/18/24 167 lb (75.8 kg)  06/04/24 167 lb (75.8 kg)     ASSESSMENT & PLAN:   CAD status post CABG with exertional dyspnea: Currently without symptoms of angina.  She reports a 1 week history of exertional dyspnea if she ambulates for prolonged time frames that improves with rest.  Without frank chest pain.  Symptoms do not feel similar to what she was experiencing leading up to her MI, which occurred this time of year in 2019.  Schedule echo and myocardial PET/CT.  Continue aggressive risk factor modification and secondary prevention including aspirin  81 mg daily indefinitely along with atorvastatin  80 mg and Lopressor  25 mg twice daily.    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  Update echo with exertional shortness of breath.  For now, remains on Lopressor  25 mg twice daily.  If there is recurrence of cardiomyopathy, escalate GDMT as able.  HTN: Blood pressure is well-controlled in the office today.  She remains on Lopressor  25 mg twice daily.  HLD: LDL 60 in 12/2023 with normal AST/ALT at that time.  She remains on atorvastatin  80 mg.  Carotid artery stenosis: Ultrasound in 2019 leading up to CABG showed 1 to 39% bilateral ICA stenosis.  Update carotid artery ultrasound.  Remains on aspirin  81 mg and atorvastatin  80 mg.   Informed Consent   Shared Decision Making/Informed Consent{  The risks [chest pain, shortness of breath, cardiac arrhythmias, dizziness, blood pressure fluctuations, myocardial infarction, stroke/transient ischemic attack, nausea, vomiting, allergic reaction, radiation exposure, metallic taste sensation and life-threatening complications (estimated to be 1 in 10,000)], benefits (risk stratification, diagnosing coronary artery disease, treatment guidance) and alternatives of a cardiac PET stress test were  discussed in detail with Ms. Cathy and she agrees to proceed.       Disposition: F/u with Dr. Mady or an APP in 2 months.   Medication Adjustments/Labs and Tests Ordered: Current medicines are reviewed at length with the patient today.  Concerns regarding medicines are outlined above. Medication changes, Labs and Tests ordered today are summarized above and listed in the Patient Instructions accessible in Encounters.   Signed, Bernardino Bring, PA-C 08/19/2024 2:59 PM     [facility] [place] [address] [place], [place] [address] [phone]

## 2024-09-09 ENCOUNTER — Encounter (HOSPITAL_COMMUNITY): Payer: Self-pay

## 2024-09-11 ENCOUNTER — Ambulatory Visit
Admission: RE | Admit: 2024-09-11 | Discharge: 2024-09-11 | Disposition: A | Source: Ambulatory Visit | Attending: Physician Assistant | Admitting: Physician Assistant

## 2024-09-11 DIAGNOSIS — R0609 Other forms of dyspnea: Secondary | ICD-10-CM

## 2024-09-11 DIAGNOSIS — I700 Atherosclerosis of aorta: Secondary | ICD-10-CM | POA: Diagnosis not present

## 2024-09-11 LAB — NM PET CT CARDIAC PERFUSION MULTI W/ABSOLUTE BLOODFLOW
LV dias vol: 95 mL (ref 46–106)
LV sys vol: 49 mL (ref 3.8–5.2)
MBFR: 0.94
Nuc Rest EF: 57 %
Nuc Stress EF: 48 %
Peak HR: 93 {beats}/min
Rest HR: 74 {beats}/min
Rest MBF: 0.98 ml/g/min
Rest Nuclear Isotope Dose: 19.5 mCi
SRS: 5
SSS: 31
ST Depression (mm): 2 mm
Stress MBF: 0.92 ml/g/min
Stress Nuclear Isotope Dose: 19.8 mCi
TID: 1.27

## 2024-09-11 MED ORDER — REGADENOSON 0.4 MG/5ML IV SOLN
INTRAVENOUS | Status: AC
  Filled 2024-09-11: qty 5

## 2024-09-11 MED ORDER — RUBIDIUM RB82 GENERATOR (RUBYFILL)
25.0000 | PACK | Freq: Once | INTRAVENOUS | Status: AC
  Administered 2024-09-11: 19.51 via INTRAVENOUS

## 2024-09-11 MED ORDER — RUBIDIUM RB82 GENERATOR (RUBYFILL)
25.0000 | PACK | Freq: Once | INTRAVENOUS | Status: AC
  Administered 2024-09-11: 19.84 via INTRAVENOUS

## 2024-09-11 MED ORDER — REGADENOSON 0.4 MG/5ML IV SOLN
0.4000 mg | Freq: Once | INTRAVENOUS | Status: AC
  Administered 2024-09-11: 0.4 mg via INTRAVENOUS
  Filled 2024-09-11: qty 5

## 2024-09-11 NOTE — Progress Notes (Signed)
 Patient presents for a cardiac PET stress test and tolerated procedure. She did report some chest pressure but that resolved shortly after the conclusion the test. Patient maintained acceptable vital signs throughout the test and was offered caffeine after test.  Patient ambulated out of department with a steady gait.

## 2024-09-12 ENCOUNTER — Encounter: Payer: Self-pay | Admitting: Student

## 2024-09-12 ENCOUNTER — Ambulatory Visit: Attending: Student | Admitting: Student

## 2024-09-12 ENCOUNTER — Ambulatory Visit: Payer: Self-pay | Admitting: Physician Assistant

## 2024-09-12 VITALS — BP 130/68 | HR 69 | Resp 19 | Ht 61.0 in | Wt 163.6 lb

## 2024-09-12 DIAGNOSIS — I25118 Atherosclerotic heart disease of native coronary artery with other forms of angina pectoris: Secondary | ICD-10-CM | POA: Diagnosis not present

## 2024-09-12 DIAGNOSIS — R0609 Other forms of dyspnea: Secondary | ICD-10-CM

## 2024-09-12 DIAGNOSIS — R9439 Abnormal result of other cardiovascular function study: Secondary | ICD-10-CM

## 2024-09-12 DIAGNOSIS — E785 Hyperlipidemia, unspecified: Secondary | ICD-10-CM

## 2024-09-12 DIAGNOSIS — I10 Essential (primary) hypertension: Secondary | ICD-10-CM

## 2024-09-12 DIAGNOSIS — I5021 Acute systolic (congestive) heart failure: Secondary | ICD-10-CM

## 2024-09-12 DIAGNOSIS — Z01810 Encounter for preprocedural cardiovascular examination: Secondary | ICD-10-CM

## 2024-09-12 MED ORDER — NITROGLYCERIN 0.4 MG SL SUBL: 0.4000 mg | SUBLINGUAL_TABLET | SUBLINGUAL | 3 refills | Status: AC | PRN

## 2024-09-12 NOTE — Patient Instructions (Signed)
 Medication Instructions:   Your physician recommends the following medication changes.  START TAKING: Nitroglycerine 0.4 mg as needed for chest pain Vasodilator: Nitroglycerin  is a potent vasodilator, relaxing blood vessels and improving blood flow to the heart.  Treatment for Angina: It's used to treat and prevent angina (chest pain) caused by coronary artery disease.  Take one tablet for chest pain. Wait 5 minutes. If the pain has not subsided, take another tablet. Wait 5 minutes. If the pain has not subsided, take third tablet and, call 911 or go to the emergency room.   Potential Side Effects: Common side effects of nitroglycerin  in medical applications include headache, dizziness, and low blood pressure.  Handling & Storage: Due to its explosive nature, nitroglycerin  requires careful handling and storage, away from heat, shock, and incompatible materials.  Drug Interactions: Nitroglycerin  can interact with other medications, such as certain drugs used for erectile dysfunction, potentially leading to dangerous drops in blood pressure.    *If you need a refill on your cardiac medications before your next appointment, please call your pharmacy*  Lab Work:  Your provider would like for you to have following labs drawn today CBC, BMP.    If you have labs (blood work) drawn today and your tests are completely normal, you will receive your results only by:  MyChart Message (if you have MyChart) OR  A paper copy in the mail If you have any lab test that is abnormal or we need to change your treatment, we will call you to review the results.  Testing/Procedures:   [place] [facility] [place] [address] [place] [address] Dept: [phone] Loc: [phone]  Olivia Roth  09/12/2024  You are scheduled for a Cardiac Catheterization on Friday, December 9 with Dr. Lonni  End.  1. Please arrive at the [facility] Entrance of [facility], [address], [place] [address] at 8:30 AM (This is 1 hour(s) prior to your procedure time).  Proceed to the Check-In Desk directly inside the entrance.  Procedure Parking: Use the entrance off of the [address] side of the hospital. Turn right upon entering and follow the driveway to parking that is directly in front of the Heart [facility] Vascular Center. There is no valet parking available at this entrance, however there is an awning directly in front of the Heart & Vascular Center for drop off/ pick up for patients.  Special note: Every effort is made to have your procedure done on time. Please understand that emergencies sometimes delay scheduled procedures.  2. Diet: NPO: Nothing to eat OR drink after midnight. (For TEE and Cath the same day)   3. Hydration: You need to be well hydrated before your procedure. On December 9, you may drink approved liquids (see below) until 2 hours before the procedure, with 16 oz of water as your last intake.   List of approved liquids water, clear juice, clear tea, black coffee, fruit juices, non-citric and without pulp, carbonated beverages, Gatorade, Kool -Aid, plain Jello-O and plain ice popsicles.  4. Labs: You will need to have blood drawn on Friday, December 5 at [facility], Go to 1st desk on your right to register.  Address: [address] [place], [place] [address]  Open: 8am - 5pm  Phone: [phone]. You do not need to be fasting.  5. Medication instructions in preparation for your procedure:   Contrast  Allergy: No   Current Outpatient Medications (Endocrine & Metabolic):    metFORMIN  (GLUCOPHAGE ) 500 MG tablet, TAKE 2 TABLETS IN THE MORNING AND 1 TABLET IN THE EVENING WITH MEALS  Current Outpatient Medications (Cardiovascular):    atorvastatin  (LIPITOR ) 80 MG tablet, TAKE 1 TABLET DAILY   metoprolol  tartrate (LOPRESSOR ) 25 MG tablet, TAKE 1  TABLET TWICE A DAY   nitroGLYCERIN  (NITROSTAT ) 0.4 MG SL tablet, Place 1 tablet (0.4 mg total) under the tongue every 5 (five) minutes as needed for chest pain.  Current Outpatient Medications (Respiratory):    albuterol  (VENTOLIN  HFA) 108 (90 Base) MCG/ACT inhaler, Inhale 2 puffs into the lungs every 6 (six) hours as needed for wheezing or shortness of breath.   fluticasone  (FLONASE ) 50 MCG/ACT nasal spray, Place 2 sprays into both nostrils daily. Use for 4-6 weeks then stop and use seasonally or as needed. (Patient not taking: Reported on 09/12/2024)  Current Outpatient Medications (Analgesics):    acetaminophen  (TYLENOL ) 500 MG tablet, Take 2 tablets (1,000 mg total) by mouth every 6 (six) hours as needed.   aspirin  EC 81 MG tablet, Take 1 tablet (81 mg total) by mouth daily.   Current Outpatient Medications (Other):    blood glucose meter kit and supplies KIT, Dispense based on patient and insurance preference. two times daily as directed. For E11.65   glucose blood test strip, Use as instructed   Lancets (ONETOUCH ULTRASOFT) lancets, TEST TWICE DAILY *For reference purposes while preparing patient instructions.   Delete this med list prior to printing instructions for patient.*  Do not take Diabetes Med Glucophage  (Metformin ) on the day of the procedure and HOLD 48 HOURS AFTER THE PROCEDURE.  On the morning of your procedure, take your Aspirin  81 mg and any morning medicines NOT listed above.  You may use sips of water.  6. Plan to go home the same day, you will only stay overnight if medically necessary. 7. Bring a current list of your medications and current insurance cards. 8. You MUST have a responsible person to drive you home. 9. Someone MUST be with you the first 24 hours after you arrive home or your discharge will be delayed. 10. Please wear clothes that are easy to get on and off and wear slip-on shoes.  Thank you for allowing us  to care for you!   -- [facility]   Referrals:  None ordered at this time   Follow-Up:  At [facility], you and your health needs are our priority.  As part of our continuing mission to provide you with exceptional heart care, our providers are all part of one team.  This team includes your primary Cardiologist (physician) and Advanced Practice Providers or APPs (Physician Assistants and Nurse Practitioners) who all work together to provide you with the care you need, when you need it.  Your next appointment:   3 week(s)  Provider:    Bernardino Bring, PA-C    We recommend signing up for the patient portal called MyChart.  Sign up information is provided on this After Visit Summary.  MyChart is used to connect with patients for Virtual Visits (Telemedicine).  Patients are able to view lab/test results, encounter notes, upcoming appointments, etc.  Non-urgent messages can be sent to your provider as well.   To learn more about what you can do with MyChart, go to [email or web address].

## 2024-09-12 NOTE — H&P (View-Only) (Signed)
 Cardiology Clinic Note   Date: 09/12/2024 ID: Olivia, Roth 1956-09-03, MRN [id number]  Primary Cardiologist:  Lonni Hanson, MD  Chief Complaint   Olivia Roth is a 68 y.o. female who presents to the clinic today for follow up after testing.   Patient Profile   Olivia Roth is followed by Dr. Hanson for the history outlined below.      Past medical history significant for: CAD. LHC 08/21/2018 (NSTEMI): Severe three-vessel CAD including 50% distal LMCA, 80% ostial/proximal LAD, 90% ostial LCx, 99% mid RCA.  Moderately to severely reduced left ventricular systolic function (30 to 35%) with global hypokinesis and mid/apical akinesis.  Moderately elevated LV filling pressure.  Recommend CTS consult. CABG x 4 08/26/2018: LIMA to LAD, SVG to ramus, SVG to OM, SVG to RCA. Cardiac PET CT 09/12/2024: There is a large defect with severe reduction in uptake present in the apical to basal anterior, anterolateral, inferior and inferolateral locations that is nearly completely reversible.  There is abnormal wall motion in the defect area. Consistent with ischemia.  Rest EF 57%, stress EF 48%.  Stress global function is mildly reduced.  Findings are consistent with extensive ischemia.  Study is high risk with large area of ischemia, severely reduced global myocardial blood flow reserve, transient ischemic dilation, and drop in LVEF with stress. 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 Echo 08/06/2019: EF 50 to 55%.  Grade I DD.  Normal RV size/function.  Mild AI. Hypertension. Hyperlipidemia. Lipid panel 12/28/2023: LDL 60, HDL 40, TG 234, total of 138. T2DM. Asthma.  In summary, patient underwent hospital mission in November 2019 for NSTEMI.  Echo at that time demonstrated EF 40 to 45%.  LHC showed severe multivessel CAD.  She was transferred to [facility] and underwent CABG x 4 with uncomplicated postop course.  Repeat echo October 2020 demonstrated EF 50 to 55%.  She  continued to do well post CABG and did not require ischemic evaluation following surgery.  Patient was last seen in the office by Bernardino Bring, PA-C on 08/19/2024 for follow-up.  She reported a 1 week history of exertional shortness of breath with prolonged ambulation.  She denied chest pain.  She stated she was completely asymptomatic with ambulation for short distances.  She had no other cardiac complaints.  EKG without acute ST-T changes.  She underwent cardiac PET stress which was a high risk study with a large area of ischemia (as detailed above).     History of Present Illness    Today, patient is accompanied by her sister and husband. She denies chest pain, pressure or tightness. She continues to describe dyspnea with heavier exertion and longer distances that resolves with 3-5 minutes of rest. She first started noticing this in October [place] She then went on a cruise in November and noted shortness of breath when walking quickly back and forth from the boat and when rushing in the airport. It was severe enough that she was not able to dance with her husband because she would get so short of breath. These symptoms are new. Over the summer she went to [place] and was able to walk, dance, and do everything she wanted without dyspnea. 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 She denies lower extremity edema.     ROS: All other systems reviewed and are otherwise negative except  as noted in History of Present Illness.  EKGs/Labs Reviewed    EKG Interpretation Date/Time:  Friday September 12 2024 11:08:52 EST Ventricular Rate:  66 PR Interval:  174 QRS Duration:  70 QT Interval:  424 QTC Calculation: 444 R Axis:   -7  Text Interpretation: Normal sinus rhythm Inferior infarct (cited on or before 19-Aug-2024) Cannot rule out Anterior infarct (cited on or before 19-Aug-2024) When  compared with ECG of 19-Aug-2024 13:59, No significant change was found Confirmed by Loistine Sober [phone]) on 09/12/2024 11:12:05 AM   12/28/2023: ALT 13; AST 21; BUN 21; Creatinine, Ser 1.03; Potassium 4.6; Sodium 141   12/28/2023: Hemoglobin 12.5; WBC 5.5    Physical Exam    VS:  BP 130/68 (BP Location: Left Arm, Patient Position: Sitting, Cuff Size: Normal)   Pulse 69   Resp 19   Ht 5' 1 (1.549 m)   Wt 163 lb 9.6 oz (74.2 kg)   SpO2 99%   BMI 30.91 kg/m  , BMI Body mass index is 30.91 kg/m.  GEN: Well nourished, well developed, in no acute distress. Neck: No JVD or carotid bruits. Cardiac:  RRR.  No murmur. No rubs or gallops.   Respiratory:  Respirations regular and unlabored. Clear to auscultation without rales, wheezing or rhonchi. GI: Soft, nontender, nondistended. Extremities: Radials/DP/PT 2+ and equal bilaterally. No clubbing or cyanosis. No edema   Skin: Warm and dry, no rash. Neuro: Strength intact.  Assessment & Plan   CAD S/p CABG x 12 August 2018 in the setting of NSTEMI.  Cardiac PET stress 09/12/2024 was a high risk study with large area of ischemia, severely reduced global myocardial blood flow reserve, transient ischemic dilation, and drop in LVEF with stress.  Patient reports continued episodes of shortness of breath with walking longer distances. Routine activities and ambulating short distances do not cause dyspnea. She denies shortness of breath. EKG without acute changes. She agrees to proceed with heart catheterization.  - Schedule LHC. - CBC and BMP today. - Rx prn NTG.  - Continue metoprolol  tartrate, aspirin , atorvastatin .  HFimpEF/ischemic cardiomyopathy EF 40 to 45% November 2019 in the setting of NSTEMI.  Repeat echo October 2024 demonstrated EF 50 to 55%.  Patient describes shortness of breath as above. No lower extremity edema. Euvolemic and well compensated on exam.  - Pending repeat echo 10/06/2024. - Continue metoprolol   tartrate.  Hypertension BP today 130/68. No report of headaches or dizziness.  - Continue metoprolol  surgery.  Hyperlipidemia LDL 60 March 2025. - Continue atorvastatin .  Disposition: CBC and BMP today. Rx prn NTG. Schedule LHC. Return in 3 weeks or sooner as needed.      Informed Consent   Shared Decision Making/Informed Consent The risks [stroke (1 in 1000), death (1 in 1000), kidney failure [usually temporary] (1 in 500), bleeding (1 in 200), allergic reaction [possibly serious] (1 in 200)], benefits (diagnostic support and management of coronary artery disease) and alternatives of a cardiac catheterization were discussed in detail with Ms. Kimbrough and she is willing to proceed.      Signed, Sober HERO. Zeriyah Wain, DNP, NP-C

## 2024-09-12 NOTE — Progress Notes (Signed)
 Cardiology Clinic Note   Date: 09/12/2024 ID: Olivia, Roth 1956-09-03, MRN [id number]  Primary Cardiologist:  Lonni Hanson, MD  Chief Complaint   Olivia Roth is a 68 y.o. female who presents to the clinic today for follow up after testing.   Patient Profile   Olivia Roth is followed by Dr. Hanson for the history outlined below.      Past medical history significant for: CAD. LHC 08/21/2018 (NSTEMI): Severe three-vessel CAD including 50% distal LMCA, 80% ostial/proximal LAD, 90% ostial LCx, 99% mid RCA.  Moderately to severely reduced left ventricular systolic function (30 to 35%) with global hypokinesis and mid/apical akinesis.  Moderately elevated LV filling pressure.  Recommend CTS consult. CABG x 4 08/26/2018: LIMA to LAD, SVG to ramus, SVG to OM, SVG to RCA. Cardiac PET CT 09/12/2024: There is a large defect with severe reduction in uptake present in the apical to basal anterior, anterolateral, inferior and inferolateral locations that is nearly completely reversible.  There is abnormal wall motion in the defect area. Consistent with ischemia.  Rest EF 57%, stress EF 48%.  Stress global function is mildly reduced.  Findings are consistent with extensive ischemia.  Study is high risk with large area of ischemia, severely reduced global myocardial blood flow reserve, transient ischemic dilation, and drop in LVEF with stress. 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 Echo 08/06/2019: EF 50 to 55%.  Grade I DD.  Normal RV size/function.  Mild AI. Hypertension. Hyperlipidemia. Lipid panel 12/28/2023: LDL 60, HDL 40, TG 234, total of 138. T2DM. Asthma.  In summary, patient underwent hospital mission in November 2019 for NSTEMI.  Echo at that time demonstrated EF 40 to 45%.  LHC showed severe multivessel CAD.  She was transferred to [facility] and underwent CABG x 4 with uncomplicated postop course.  Repeat echo October 2020 demonstrated EF 50 to 55%.  She  continued to do well post CABG and did not require ischemic evaluation following surgery.  Patient was last seen in the office by Bernardino Bring, PA-C on 08/19/2024 for follow-up.  She reported a 1 week history of exertional shortness of breath with prolonged ambulation.  She denied chest pain.  She stated she was completely asymptomatic with ambulation for short distances.  She had no other cardiac complaints.  EKG without acute ST-T changes.  She underwent cardiac PET stress which was a high risk study with a large area of ischemia (as detailed above).     History of Present Illness    Today, patient is accompanied by her sister and husband. She denies chest pain, pressure or tightness. She continues to describe dyspnea with heavier exertion and longer distances that resolves with 3-5 minutes of rest. She first started noticing this in October [place] She then went on a cruise in November and noted shortness of breath when walking quickly back and forth from the boat and when rushing in the airport. It was severe enough that she was not able to dance with her husband because she would get so short of breath. These symptoms are new. Over the summer she went to [place] and was able to walk, dance, and do everything she wanted without dyspnea. 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 She denies lower extremity edema.     ROS: All other systems reviewed and are otherwise negative except  as noted in History of Present Illness.  EKGs/Labs Reviewed    EKG Interpretation Date/Time:  Friday September 12 2024 11:08:52 EST Ventricular Rate:  66 PR Interval:  174 QRS Duration:  70 QT Interval:  424 QTC Calculation: 444 R Axis:   -7  Text Interpretation: Normal sinus rhythm Inferior infarct (cited on or before 19-Aug-2024) Cannot rule out Anterior infarct (cited on or before 19-Aug-2024) When  compared with ECG of 19-Aug-2024 13:59, No significant change was found Confirmed by Loistine Sober [phone]) on 09/12/2024 11:12:05 AM   12/28/2023: ALT 13; AST 21; BUN 21; Creatinine, Ser 1.03; Potassium 4.6; Sodium 141   12/28/2023: Hemoglobin 12.5; WBC 5.5    Physical Exam    VS:  BP 130/68 (BP Location: Left Arm, Patient Position: Sitting, Cuff Size: Normal)   Pulse 69   Resp 19   Ht 5' 1 (1.549 m)   Wt 163 lb 9.6 oz (74.2 kg)   SpO2 99%   BMI 30.91 kg/m  , BMI Body mass index is 30.91 kg/m.  GEN: Well nourished, well developed, in no acute distress. Neck: No JVD or carotid bruits. Cardiac:  RRR.  No murmur. No rubs or gallops.   Respiratory:  Respirations regular and unlabored. Clear to auscultation without rales, wheezing or rhonchi. GI: Soft, nontender, nondistended. Extremities: Radials/DP/PT 2+ and equal bilaterally. No clubbing or cyanosis. No edema   Skin: Warm and dry, no rash. Neuro: Strength intact.  Assessment & Plan   CAD S/p CABG x 12 August 2018 in the setting of NSTEMI.  Cardiac PET stress 09/12/2024 was a high risk study with large area of ischemia, severely reduced global myocardial blood flow reserve, transient ischemic dilation, and drop in LVEF with stress.  Patient reports continued episodes of shortness of breath with walking longer distances. Routine activities and ambulating short distances do not cause dyspnea. She denies shortness of breath. EKG without acute changes. She agrees to proceed with heart catheterization.  - Schedule LHC. - CBC and BMP today. - Rx prn NTG.  - Continue metoprolol  tartrate, aspirin , atorvastatin .  HFimpEF/ischemic cardiomyopathy EF 40 to 45% November 2019 in the setting of NSTEMI.  Repeat echo October 2024 demonstrated EF 50 to 55%.  Patient describes shortness of breath as above. No lower extremity edema. Euvolemic and well compensated on exam.  - Pending repeat echo 10/06/2024. - Continue metoprolol   tartrate.  Hypertension BP today 130/68. No report of headaches or dizziness.  - Continue metoprolol  surgery.  Hyperlipidemia LDL 60 March 2025. - Continue atorvastatin .  Disposition: CBC and BMP today. Rx prn NTG. Schedule LHC. Return in 3 weeks or sooner as needed.      Informed Consent   Shared Decision Making/Informed Consent The risks [stroke (1 in 1000), death (1 in 1000), kidney failure [usually temporary] (1 in 500), bleeding (1 in 200), allergic reaction [possibly serious] (1 in 200)], benefits (diagnostic support and management of coronary artery disease) and alternatives of a cardiac catheterization were discussed in detail with Ms. Kimbrough and she is willing to proceed.      Signed, Sober HERO. Zeriyah Wain, DNP, NP-C

## 2024-09-13 ENCOUNTER — Ambulatory Visit: Payer: Self-pay | Admitting: Student

## 2024-09-13 LAB — BASIC METABOLIC PANEL WITH GFR
BUN/Creatinine Ratio: 23 (ref 12–28)
BUN: 23 mg/dL (ref 8–27)
CO2: 20 mmol/L (ref 20–29)
Calcium: 9.5 mg/dL (ref 8.7–10.3)
Chloride: 105 mmol/L (ref 96–106)
Creatinine, Ser: 0.98 mg/dL (ref 0.57–1.00)
Glucose: 142 mg/dL — ABNORMAL HIGH (ref 70–99)
Potassium: 4.9 mmol/L (ref 3.5–5.2)
Sodium: 142 mmol/L (ref 134–144)
eGFR: 62 mL/min/1.73 (ref >59)

## 2024-09-13 LAB — CBC
Hematocrit: 40.1 % (ref 34.0–46.6)
Hemoglobin: 13 g/dL (ref 11.1–15.9)
MCH: 30.2 pg (ref 26.6–33.0)
MCHC: 32.4 g/dL (ref 31.5–35.7)
MCV: 93 fL (ref 79–97)
Platelets: 238 x10E3/uL (ref 150–450)
RBC: 4.31 x10E6/uL (ref 3.77–5.28)
RDW: 13.8 % (ref 11.7–15.4)
WBC: 5.6 x10E3/uL (ref 3.4–10.8)

## 2024-09-17 NOTE — Progress Notes (Signed)
 Last read by Hadassah JONETTA Hussar at 1:11PM on 09/15/2024.

## 2024-09-18 DIAGNOSIS — E119 Type 2 diabetes mellitus without complications: Secondary | ICD-10-CM | POA: Diagnosis not present

## 2024-09-18 DIAGNOSIS — I2584 Coronary atherosclerosis due to calcified coronary lesion: Secondary | ICD-10-CM | POA: Diagnosis not present

## 2024-09-18 DIAGNOSIS — Z951 Presence of aortocoronary bypass graft: Secondary | ICD-10-CM | POA: Diagnosis not present

## 2024-09-18 DIAGNOSIS — I2582 Chronic total occlusion of coronary artery: Secondary | ICD-10-CM | POA: Diagnosis not present

## 2024-09-18 DIAGNOSIS — I25110 Atherosclerotic heart disease of native coronary artery with unstable angina pectoris: Secondary | ICD-10-CM | POA: Diagnosis not present

## 2024-09-18 DIAGNOSIS — I110 Hypertensive heart disease with heart failure: Secondary | ICD-10-CM | POA: Diagnosis not present

## 2024-09-18 DIAGNOSIS — I255 Ischemic cardiomyopathy: Secondary | ICD-10-CM | POA: Diagnosis not present

## 2024-09-18 DIAGNOSIS — K219 Gastro-esophageal reflux disease without esophagitis: Secondary | ICD-10-CM | POA: Diagnosis not present

## 2024-09-18 DIAGNOSIS — I252 Old myocardial infarction: Secondary | ICD-10-CM | POA: Diagnosis not present

## 2024-09-18 DIAGNOSIS — F419 Anxiety disorder, unspecified: Secondary | ICD-10-CM | POA: Diagnosis not present

## 2024-09-18 DIAGNOSIS — Z7982 Long term (current) use of aspirin: Secondary | ICD-10-CM | POA: Diagnosis not present

## 2024-09-18 DIAGNOSIS — I5032 Chronic diastolic (congestive) heart failure: Secondary | ICD-10-CM | POA: Diagnosis not present

## 2024-09-18 DIAGNOSIS — E785 Hyperlipidemia, unspecified: Secondary | ICD-10-CM | POA: Diagnosis not present

## 2024-09-18 DIAGNOSIS — R9439 Abnormal result of other cardiovascular function study: Secondary | ICD-10-CM | POA: Diagnosis present

## 2024-09-18 DIAGNOSIS — Z79899 Other long term (current) drug therapy: Secondary | ICD-10-CM | POA: Diagnosis not present

## 2024-09-19 ENCOUNTER — Ambulatory Visit
Admission: RE | Admit: 2024-09-19 | Discharge: 2024-09-19 | Disposition: A | Attending: Internal Medicine | Admitting: Internal Medicine

## 2024-09-19 ENCOUNTER — Encounter: Payer: Self-pay | Admitting: Internal Medicine

## 2024-09-19 ENCOUNTER — Encounter: Admission: RE | Disposition: A | Payer: Self-pay | Source: Home / Self Care | Attending: Internal Medicine

## 2024-09-19 ENCOUNTER — Other Ambulatory Visit: Payer: Self-pay

## 2024-09-19 DIAGNOSIS — R9439 Abnormal result of other cardiovascular function study: Secondary | ICD-10-CM

## 2024-09-19 DIAGNOSIS — I5032 Chronic diastolic (congestive) heart failure: Secondary | ICD-10-CM | POA: Insufficient documentation

## 2024-09-19 DIAGNOSIS — I25118 Atherosclerotic heart disease of native coronary artery with other forms of angina pectoris: Secondary | ICD-10-CM

## 2024-09-19 DIAGNOSIS — I2582 Chronic total occlusion of coronary artery: Secondary | ICD-10-CM | POA: Insufficient documentation

## 2024-09-19 DIAGNOSIS — Z7982 Long term (current) use of aspirin: Secondary | ICD-10-CM | POA: Insufficient documentation

## 2024-09-19 DIAGNOSIS — Z79899 Other long term (current) drug therapy: Secondary | ICD-10-CM | POA: Insufficient documentation

## 2024-09-19 DIAGNOSIS — K219 Gastro-esophageal reflux disease without esophagitis: Secondary | ICD-10-CM | POA: Insufficient documentation

## 2024-09-19 DIAGNOSIS — Z951 Presence of aortocoronary bypass graft: Secondary | ICD-10-CM | POA: Insufficient documentation

## 2024-09-19 DIAGNOSIS — I25110 Atherosclerotic heart disease of native coronary artery with unstable angina pectoris: Secondary | ICD-10-CM | POA: Diagnosis not present

## 2024-09-19 DIAGNOSIS — E119 Type 2 diabetes mellitus without complications: Secondary | ICD-10-CM | POA: Insufficient documentation

## 2024-09-19 DIAGNOSIS — F419 Anxiety disorder, unspecified: Secondary | ICD-10-CM | POA: Insufficient documentation

## 2024-09-19 DIAGNOSIS — I252 Old myocardial infarction: Secondary | ICD-10-CM | POA: Insufficient documentation

## 2024-09-19 DIAGNOSIS — I255 Ischemic cardiomyopathy: Secondary | ICD-10-CM | POA: Insufficient documentation

## 2024-09-19 DIAGNOSIS — I2584 Coronary atherosclerosis due to calcified coronary lesion: Secondary | ICD-10-CM | POA: Insufficient documentation

## 2024-09-19 DIAGNOSIS — I110 Hypertensive heart disease with heart failure: Secondary | ICD-10-CM | POA: Insufficient documentation

## 2024-09-19 DIAGNOSIS — E785 Hyperlipidemia, unspecified: Secondary | ICD-10-CM | POA: Insufficient documentation

## 2024-09-19 HISTORY — PX: LEFT HEART CATH AND CORS/GRAFTS ANGIOGRAPHY: CATH118250

## 2024-09-19 LAB — GLUCOSE, CAPILLARY
Glucose-Capillary: 135 mg/dL — ABNORMAL HIGH (ref 70–99)
Glucose-Capillary: 110 mg/dL — ABNORMAL HIGH (ref 70–99)

## 2024-09-19 SURGERY — LEFT HEART CATH AND CORS/GRAFTS ANGIOGRAPHY
Anesthesia: Moderate Sedation | Laterality: Left

## 2024-09-19 MED ORDER — ONDANSETRON HCL 4 MG/2ML IJ SOLN: 4.0000 mg | Freq: Four times a day (QID) | INTRAMUSCULAR | Status: DC | PRN

## 2024-09-19 MED ORDER — LIDOCAINE HCL 1 % IJ SOLN
INTRAMUSCULAR | Status: AC
  Filled 2024-09-19: qty 20

## 2024-09-19 MED ORDER — SODIUM CHLORIDE 0.9% FLUSH: 3.0000 mL | Freq: Two times a day (BID) | INTRAVENOUS | Status: DC

## 2024-09-19 MED ORDER — SODIUM CHLORIDE 0.9% FLUSH: 3.0000 mL | INTRAVENOUS | Status: DC | PRN

## 2024-09-19 MED ORDER — VERAPAMIL HCL 2.5 MG/ML IV SOLN
INTRAVENOUS | Status: AC
  Filled 2024-09-19: qty 2

## 2024-09-19 MED ORDER — SODIUM CHLORIDE 0.9 % IV SOLN
250.0000 mL | INTRAVENOUS | Status: DC | PRN
  Administered 2024-09-19: 250 mL via INTRAVENOUS

## 2024-09-19 MED ORDER — ISOSORBIDE MONONITRATE ER 30 MG PO TB24: 30.0000 mg | ORAL_TABLET | Freq: Every day | ORAL | 11 refills | Status: DC

## 2024-09-19 MED ORDER — HEPARIN SODIUM (PORCINE) 1000 UNIT/ML IJ SOLN
INTRAMUSCULAR | Status: DC | PRN
  Administered 2024-09-19: 3500 [IU] via INTRAVENOUS

## 2024-09-19 MED ORDER — HEPARIN SODIUM (PORCINE) 1000 UNIT/ML IJ SOLN
INTRAMUSCULAR | Status: AC
  Filled 2024-09-19: qty 10

## 2024-09-19 MED ORDER — FREE WATER: 500.0000 mL | Freq: Once | Status: DC

## 2024-09-19 MED ORDER — HYDRALAZINE HCL 20 MG/ML IJ SOLN: 10.0000 mg | INTRAMUSCULAR | Status: DC | PRN

## 2024-09-19 MED ORDER — FENTANYL CITRATE (PF) 100 MCG/2ML IJ SOLN
INTRAMUSCULAR | Status: AC
  Filled 2024-09-19: qty 2

## 2024-09-19 MED ORDER — MIDAZOLAM HCL 2 MG/2ML IJ SOLN
INTRAMUSCULAR | Status: AC
  Filled 2024-09-19: qty 2

## 2024-09-19 MED ORDER — SODIUM CHLORIDE 0.9 % IV SOLN: 250.0000 mL | INTRAVENOUS | Status: DC | PRN

## 2024-09-19 MED ORDER — SODIUM CHLORIDE 0.9 % IV BOLUS
INTRAVENOUS | Status: DC | PRN
  Administered 2024-09-19: 250 mL via INTRAVENOUS

## 2024-09-19 MED ORDER — FENTANYL CITRATE (PF) 100 MCG/2ML IJ SOLN
INTRAMUSCULAR | Status: DC | PRN
  Administered 2024-09-19 (×2): 25 ug via INTRAVENOUS

## 2024-09-19 MED ORDER — SODIUM CHLORIDE 0.9 % IV SOLN: INTRAVENOUS | Status: DC

## 2024-09-19 MED ORDER — ASPIRIN 81 MG PO CHEW: 81.0000 mg | CHEWABLE_TABLET | ORAL | Status: DC

## 2024-09-19 MED ORDER — LABETALOL HCL 5 MG/ML IV SOLN: 10.0000 mg | INTRAVENOUS | Status: DC | PRN

## 2024-09-19 MED ORDER — MIDAZOLAM HCL (PF) 2 MG/2ML IJ SOLN
INTRAMUSCULAR | Status: DC | PRN
  Administered 2024-09-19 (×2): 1 mg via INTRAVENOUS

## 2024-09-19 MED ORDER — ACETAMINOPHEN 325 MG PO TABS: 650.0000 mg | ORAL_TABLET | ORAL | Status: DC | PRN

## 2024-09-19 MED ORDER — HEPARIN (PORCINE) IN NACL 1000-0.9 UT/500ML-% IV SOLN
INTRAVENOUS | Status: AC
  Filled 2024-09-19: qty 1000

## 2024-09-19 MED ORDER — VERAPAMIL HCL 2.5 MG/ML IV SOLN
INTRAVENOUS | Status: DC | PRN
  Administered 2024-09-19 (×2): 2.5 mg via INTRA_ARTERIAL

## 2024-09-19 MED ADMIN — Heparin Sod (Porcine)-NaCl IV Soln 1000 Unit/500ML-0.9%: 1000 mL | NDC 00409762013

## 2024-09-19 MED ADMIN — Iohexol Inj 300 MG/ML: 58 mL | NDC 00407141363

## 2024-09-19 MED ADMIN — Lidocaine HCl Local Preservative Free (PF) Inj 1%: 2 mL | NDC 00409427902

## 2024-09-19 SURGICAL SUPPLY — 10 items
CATH INFINITI 5 FR IM (CATHETERS) IMPLANT
CATH INFINITI 5 FR MPA2 (CATHETERS) IMPLANT
CATH INFINITI 5FR JL4 (CATHETERS) IMPLANT
DEVICE RAD TR BAND REGULAR (VASCULAR PRODUCTS) IMPLANT
DRAPE BRACHIAL (DRAPES) IMPLANT
GLIDESHEATH SLEND SS 6F .021 (SHEATH) IMPLANT
GUIDEWIRE INQWIRE 1.5J.035X260 (WIRE) IMPLANT
PACK CARDIAC CATH (CUSTOM PROCEDURE TRAY) ×1 IMPLANT
SET ATX-X65L (MISCELLANEOUS) IMPLANT
STATION PROTECTION PRESSURIZED (MISCELLANEOUS) IMPLANT

## 2024-09-19 NOTE — Discharge Instructions (Signed)
 Radial Site Care Refer to this sheet in the next few weeks. These instructions provide you with information about caring for yourself after your procedure. Your health care provider may also give you more specific instructions. Your treatment has been planned according to current medical practices, but problems sometimes occur. Call your health care provider if you have any problems or questions after your procedure. What can I expect after the procedure? After your procedure, it is typical to have the following: Bruising at the radial site that usually fades within 1-2 weeks. Blood collecting in the tissue (hematoma) that may be painful to the touch. It should usually decrease in size and tenderness within 1-2 weeks.  Follow these instructions at home: Take medicines only as directed by your health care provider. If you are on a medication called Metformin please do not take for 48 hours after your procedure. Over the next 48hrs please increase your fluid intake of water and non caffeine beverages to flush the contrast dye out of your system.  You may shower 24 hours after the procedure  Leave your bandage on and gently wash the site with plain soap and water. Pat the area dry with a clean towel. Do not rub the site, because this may cause bleeding.  Remove your dressing 48hrs after your procedure and leave open to air.  Do not submerge your site in water for 7 days. This includes swimming and washing dishes.  Check your insertion site every day for redness, swelling, or drainage. Do not apply powder or lotion to the site. Do not flex or bend the affected arm for 24 hours or as directed by your health care provider. Do not push or pull heavy objects with the affected arm for 24 hours or as directed by your health care provider. Do not lift over 10 lb (4.5 kg) for 5 days after your procedure or as directed by your health care provider. Ask your health care provider when it is okay to: Return to  work or school. Resume usual physical activities or sports. Resume sexual activity. Do not drive home if you are discharged the same day as the procedure. Have someone else drive you. You may drive 48 hours after the procedure Do not operate machinery or power tools for 24 hours after the procedure. If your procedure was done as an outpatient procedure, which means that you went home the same day as your procedure, a responsible adult should be with you for the first 24 hours after you arrive home. Keep all follow-up visits as directed by your health care provider. This is important. Contact a health care provider if: You have a fever. You have chills. You have increased bleeding from the radial site. Hold pressure on the site. Get help right away if: You have unusual pain at the radial site. You have redness, warmth, or swelling at the radial site. You have drainage (other than a small amount of blood on the dressing) from the radial site. The radial site is bleeding, and the bleeding does not stop after 15 minutes of holding steady pressure on the site. Your arm or hand becomes pale, cool, tingly, or numb. This information is not intended to replace advice given to you by your health care provider. Make sure you discuss any questions you have with your health care provider. Document Released: 10/28/2010 Document Revised: 03/02/2016 Document Reviewed: 04/13/2014 Elsevier Interactive Patient Education  2018 [employer].

## 2024-09-19 NOTE — Interval H&P Note (Signed)
 History and Physical Interval Note:  09/19/2024 9:56 AM  Olivia Roth  has presented today for surgery, with the diagnosis of coronary artery disease with unstable angina and abnormal stress test.  The various methods of treatment have been discussed with the patient and family. After consideration of risks, benefits and other options for treatment, the patient has consented to  Procedures: LEFT HEART CATH AND CORS/GRAFTS ANGIOGRAPHY (Left) as a surgical intervention.  The patient's history has been reviewed, patient examined, no change in status, stable for surgery.  I have reviewed the patient's chart and labs.  Questions were answered to the patient's satisfaction.    Cath Lab Visit (complete for each Cath Lab visit)  Clinical Evaluation Leading to the Procedure:   ACS: No.  Non-ACS:    Anginal Classification: CCS III  Anti-ischemic medical therapy: Minimal Therapy (1 class of medications)  Non-Invasive Test Results: High-risk stress test findings: cardiac mortality >3%/year  Prior CABG: Previous CABG  Praise Dolecki

## 2024-09-30 ENCOUNTER — Other Ambulatory Visit: Payer: Self-pay | Admitting: Physician Assistant

## 2024-09-30 DIAGNOSIS — E785 Hyperlipidemia, unspecified: Secondary | ICD-10-CM

## 2024-09-30 DIAGNOSIS — R0609 Other forms of dyspnea: Secondary | ICD-10-CM

## 2024-09-30 DIAGNOSIS — I5020 Unspecified systolic (congestive) heart failure: Secondary | ICD-10-CM

## 2024-09-30 DIAGNOSIS — I25118 Atherosclerotic heart disease of native coronary artery with other forms of angina pectoris: Secondary | ICD-10-CM

## 2024-09-30 DIAGNOSIS — I6523 Occlusion and stenosis of bilateral carotid arteries: Secondary | ICD-10-CM

## 2024-09-30 DIAGNOSIS — I10 Essential (primary) hypertension: Secondary | ICD-10-CM

## 2024-09-30 DIAGNOSIS — I255 Ischemic cardiomyopathy: Secondary | ICD-10-CM

## 2024-10-06 ENCOUNTER — Ambulatory Visit: Attending: Physician Assistant

## 2024-10-06 ENCOUNTER — Ambulatory Visit

## 2024-10-06 DIAGNOSIS — R0609 Other forms of dyspnea: Secondary | ICD-10-CM

## 2024-10-06 DIAGNOSIS — I6523 Occlusion and stenosis of bilateral carotid arteries: Secondary | ICD-10-CM

## 2024-10-06 LAB — ECHOCARDIOGRAM COMPLETE
AR max vel: 2.61 cm2
AV Area VTI: 2.71 cm2
AV Area mean vel: 2.56 cm2
AV Mean grad: 4 mmHg
AV Peak grad: 6.2 mmHg
Ao pk vel: 1.24 m/s
Area-P 1/2: 3.27 cm2
Calc EF: 52.2 %
P 1/2 time: 462 ms
S' Lateral: 3.2 cm
Single Plane A2C EF: 51.7 %
Single Plane A4C EF: 51.2 %

## 2024-10-07 ENCOUNTER — Ambulatory Visit: Payer: Self-pay | Admitting: Student

## 2024-10-13 ENCOUNTER — Ambulatory Visit

## 2024-10-13 NOTE — Progress Notes (Signed)
 "  Cardiology Office Note    Date:  10/17/2024   ID:  Olivia, Roth 01-Feb-1956, MRN [id number]  PCP:  [name], DO  Cardiologist:  Lonni Hanson, MD  Electrophysiologist:  None   Chief Complaint: Follow-up  History of Present Illness:   Olivia Roth is a 69 y.o. 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  She was admitted to the hospital in 08/2018 with an NSTEMI.  Echo showed an EF of 40 to 45%.  LHC showed severe multivessel CAD with an EF of 30 to 35%.  She was transferred to [facility] and underwent four-vessel CABG with relatively uncomplicated postoperative course.  Pre-CABG imaging showed bilateral ICA stenosis of 1 to 39% with normal ABIs bilaterally.  Intraoperative TEE showed an EF of 45 to 50% with hypokinesis of the anteroseptal and inferoseptal wall.  Echo from 07/2019 demonstrated an EF of 50 to 55%, grade 1 diastolic dysfunction, normal RV systolic function and ventricular cavity size, and no significant valvular abnormalities.    She was seen in the office in 08/2024 reporting a 1 week history of exertional shortness of breath with prolonged ambulation without frank chest pain.  She also continued to note longstanding left-sided neck tightness that had been present since prior to her MI in 2019 and was unchanged.  Myocardial PET/CT on 09/11/2024 demonstrated a large defect with severe reduction in uptake present in the apical to basal anterior, anterolateral, inferior, and inferolateral locations that was nearly completely reversible with abnormal wall motion in the defect area.  Findings were consistent with ischemia.  Rest EF of 57% with a stress EF of 48%.  LHC on 09/19/2024 showed severe three-vessel native CAD.  When compared to cath in 2019, there was interval CTO of the  distal RCA.  Otherwise, coronary anatomy appeared similar.  Widely patent LIMA to LAD, chronically occluded SVG to ramus intermedius, SVG to OM, and SVG to RCA.  Low normal LV systolic function with an EF of 50 to 55% with subtle mid anterolateral hypokinesis.  Normal LVEDP.  She was started on Imdur  30 mg daily with consideration for PCI from distal left main into LCx for refractory angina.  Echo on 10/06/2024 showed an EF of 55 to 60%, no regional wall motion abnormalities, grade 1 diastolic dysfunction, normal RV systolic function and ventricular cavity size, mild to moderate mitral regurgitation, moderate aortic insufficiency, aortic valve sclerosis, and normal CVP.  Carotid artery ultrasound in 09/2024 showed 1 to 39% right ICA stenosis with no evidence of stenosis of the left ICA along with antegrade flow of the bilateral vertebral arteries and normal flow hemodynamics at the bilateral subclavian arteries.  She comes in accompanied by her husband today and is doing well from a cardiac perspective.  Since starting Imdur  she has not had any further exertional dyspnea.  No exertional angina.  She does feel a little dizzy for approximately 10 minutes after taking Imdur , though this is subsequently resolves.  Otherwise, no off target effect with isosorbide .  Is back to being active, walking long distances and up and down flights of stairs at school without symptoms concerning for angina/dyspnea.  No presyncope or syncope.  Overall feels well.   Labs independently reviewed: 09/2024 - BUN 23, serum creatinine 0.98, potassium  4.9, Hgb 13.0, PLT 238 12/2023 - albumin  4.4, AST/ALT normal, TC 138, TG 234, HDL 40, LDL 60 09/2021 - TSH normal   Past Medical History:  Diagnosis Date   Anxiety    Asthma    In cold weather (08/21/2018)   CAD (coronary artery disease)    a. 08/2018 NSTEMI/Cath: LM 50d,LAD 80ost/p, 55m 70d, LCX 90ost, OM1 50, OM3 70, OM4 80, LPDA small, RCA  99p/d, EF 30-35%; c. 08/2018 CABG x 4:  LIMA->LAD, VG->RI, VG->OM, VG->RCA.   Chronic combined systolic (congestive) and diastolic (congestive) heart failure (HCC)    a. 08/2018 Echo: EF 40-45%, mid-apicalanteroseptal and apical HK. Gr2 DD. Mild AI; b. 08/2018 intraop TEE: EF 45-50%, antsept, infsept HK. Mild AI. Mildly dil Ao root. Trace MR.   Family history of adverse reaction to anesthesia    mother PONV; talked crazy/loud after anesthesia (08/21/2018)   Grade I diastolic dysfunction    High cholesterol    Ischemic cardiomyopathy    a. 08/2018 Echo: EF 40-45%; b. 08/2018 TEE: EF 45-50%.   Myocardial infarction [facility])    2019   Seasonal allergies    Tuberculosis ~ 1960   Type II diabetes mellitus (HCC)    UTI (lower urinary tract infection)    once; before hysterectomy (08/21/2018)    Past Surgical History:  Procedure Laterality Date   CARDIAC CATHETERIZATION     CATARACT EXTRACTION W/PHACO Right 06/04/2024   Procedure: PHACOEMULSIFICATION, CATARACT, WITH IOL INSERTION 7.31 00:44.5;  Surgeon: Mittie Gaskin, MD;  Location: [facility];  Service: Ophthalmology;  Laterality: Right;   CATARACT EXTRACTION W/PHACO Left 06/18/2024   Procedure: PHACOEMULSIFICATION, CATARACT, WITH IOL INSERTION 7.87 00:58.9;  Surgeon: Mittie Gaskin, MD;  Location: [facility];  Service: Ophthalmology;  Laterality: Left;   CORONARY ARTERY BYPASS GRAFT N/A 08/26/2018   Procedure: CORONARY ARTERY BYPASS GRAFTING (CABG) times four using left internal mammary artery and right leg saphenous vein grafts;  Surgeon: Fleeta Hanford Coy, MD;  Location: [facility] OR;  Service: Open Heart Surgery;  Laterality: N/A;   GANGLION CYST EXCISION Left ~ 1960   neck   LEFT HEART CATH AND CORONARY ANGIOGRAPHY N/A 08/21/2018   Procedure: LEFT HEART CATH AND CORONARY ANGIOGRAPHY;  Surgeon: Mady Bruckner, MD;  Location: [facility] INVASIVE CV LAB;  Service: Cardiovascular;  Laterality: N/A;   LEFT HEART CATH AND CORS/GRAFTS ANGIOGRAPHY Left 09/19/2024    Procedure: LEFT HEART CATH AND CORS/GRAFTS ANGIOGRAPHY;  Surgeon: Mady Bruckner, MD;  Location: [facility] INVASIVE CV LAB;  Service: Cardiovascular;  Laterality: Left;   TEE WITHOUT CARDIOVERSION N/A 08/26/2018   Procedure: TRANSESOPHAGEAL ECHOCARDIOGRAM (TEE);  Surgeon: Fleeta Hanford, Coy, MD;  Location: [facility] OR;  Service: Open Heart Surgery;  Laterality: N/A;   TOOTH EXTRACTION     TOTAL ABDOMINAL HYSTERECTOMY  2006    Current Medications: Active Medications[1]  Allergies:   Shellfish allergy, Augmentin  [amoxicillin -pot clavulanate], Avocado, Garcinia mangostana extract [garcinia cambogia], Other, Advair diskus [fluticasone -salmeterol], Cortisone, Pecan nut (diagnostic), and Salmon [fish allergy]   Social History   Socioeconomic History   Marital status: Married    Spouse name: Not on file   Number of children: 2   Years of education: 18   Highest education level: Not on file  Occupational History   Occupation: Set Designer: [employer]    Comment: Clinical Cytogeneticist  Tobacco Use   Smoking status: Never   Smokeless tobacco: Never  Vaping Use   Vaping status: Never Used  Substance and Sexual Activity  Alcohol use: Not Currently    Comment: socially   Drug use: Never   Sexual activity: Not on file  Other Topics Concern   Not on file  Social History Narrative   Not on file   Social Drivers of Health   Tobacco Use: Low Risk (10/17/2024)   Patient History    Smoking Tobacco Use: Never    Smokeless Tobacco Use: Never    Passive Exposure: Not on file  Financial Resource Strain: Not on file  Food Insecurity: Not on file  Transportation Needs: Not on file  Physical Activity: Not on file  Stress: Not on file  Social Connections: Not on file  Depression (PHQ2-9): Low Risk (10/01/2023)   Depression (PHQ2-9)    PHQ-2 Score: 0  Alcohol Screen: Not on file  Housing: Not on file  Utilities: Not on file  Health Literacy: Not on file     Family History:  The  patient's family history includes Cancer in her sister. There is no history of Heart disease.  ROS:   12-point review of systems is negative unless otherwise noted in the HPI.   EKGs/Labs/Other Studies Reviewed:    Studies reviewed were summarized above. The additional studies were reviewed today:  Carotid artery ultrasound 10/06/2024: Summary:  Right Carotid: Velocities in the right ICA are consistent with a 1-39% stenosis.   Left Carotid: There is no evidence of stenosis in the left ICA.   Vertebrals:  Bilateral vertebral arteries demonstrate antegrade flow.  Subclavians: Normal flow hemodynamics were seen in bilateral subclavian arteries.  __________  2D echo 10/06/2024: 1. Left ventricular ejection fraction, by estimation, is 55 to 60%. The  left ventricle has normal function. The left ventricle has no regional  wall motion abnormalities. Left ventricular diastolic parameters are  consistent with Grade I diastolic  dysfunction (impaired relaxation).   2. Right ventricular systolic function is normal. The right ventricular  size is normal.   3. The mitral valve is normal in structure. Mild to moderate mitral valve  regurgitation. No evidence of mitral stenosis.   4. The aortic valve is normal in structure. Aortic valve regurgitation is  moderate. Aortic valve sclerosis is present, with no evidence of aortic  valve stenosis.   5. The inferior vena cava is normal in size with greater than 50%  respiratory variability, suggesting right atrial pressure of 3 mmHg.  __________  LHC 09/19/2024:   Dist LM lesion is 50% stenosed.   Ost LAD to Prox LAD lesion is 80% stenosed.   Mid LAD lesion is 60% stenosed.   Dist LAD lesion is 70% stenosed.   Prox RCA to Mid RCA lesion is 99% stenosed.   Ost 1st Mrg to 1st Mrg lesion is 50% stenosed.   Ost 3rd Mrg lesion is 70% stenosed.   Ost 4th Mrg lesion is 80% stenosed.   Dist RCA lesion is 100% stenosed.   Prox Cx lesion is 70%  stenosed.   Ost Cx lesion is 90% stenosed.   Origin to Insertion lesion is 100% stenosed.   Origin to Insertion lesion is 100% stenosed.   Origin to Insertion lesion is 100% stenosed.   LIMA graft was visualized by angiography and is normal in caliber.   SVG graft was visualized by angiography.   SVG graft was visualized by angiography.   SVG graft was visualized by angiography.   The graft exhibits no disease.   The left ventricular systolic function is normal.   LV end diastolic pressure is  normal.   The left ventricular ejection fraction is 50-55% by visual estimate.   There is no aortic valve stenosis.   In the absence of any other complications or medical issues, we expect the patient to be ready for discharge from a cath perspective on 09/19/2024.   Recommend Aspirin  81mg  daily for moderate CAD.   Conclusions: Severe three-vessel coronary artery disease, as detailed below.  Compared to last catheterization in 2019, there has been interval chronic total occlusion of the distal RCA.  Otherwise, coronary anatomy appears similar. Widely patent LIMA-LAD. Chronically occluded SVG-ramus intermedius, SVG-OM, and SVG-RCA. Low normal left ventricular systolic function (LVEF 50-55%) with subtle mid anterolateral hypokinesis. Normal left ventricular filling pressure (LVEDP 8 mmHg).   Recommendations: Escalate antianginal therapy with addition of isosorbide  mononitrate 30 mg daily. Follow-up in 2 to 3 weeks.  If patient continues to have anginal symptoms, consider PCI from distal LMCA into LCx.  Would favor performing this at [facility] given challenging anatomy with severe tortuosity and at least moderate calcification of the lesion. Continue aggressive secondary prevention of coronary artery disease. __________  Myocardial PET/CT 09/11/2024:   LV perfusion is abnormal. There is evidence of ischemia. Defect 1: There is a large defect with severe reduction in uptake present in the  apical to basal anterior, anterolateral, inferior and inferolateral location(s) that is nearly completely reversible. There is abnormal wall motion in the defect area. Consistent with ischemia.   Rest left ventricular function is normal. Rest EF: 57%. Stress left ventricular function is abnormal. Stress global function is mildly reduced. Stress EF: 48%. End diastolic cavity size is normal. End systolic cavity size is normal.   Myocardial blood flow was computed to be 0.46ml/g/min at rest and 0.92ml/g/min at stress. Global myocardial blood flow reserve was 0.94 and was highly abnormal.   Coronary calcium  assessment not performed due to prior revascularization.   Findings are consistent with extensive ischemia. The study is high risk, with large area of ischemia, severely reduced global myocardial blood flow reserve, transient ischemic dilation, and drop in LVEF with stress. __________  Limited echo 08/06/2019: 1. Left ventricular ejection fraction, by visual estimation, is 50 to  55%. The left ventricle has normal function. Normal left ventricular size.  There is no left ventricular hypertrophy.   2. Left ventricular diastolic parameters are consistent with Grade I  diastolic dysfunction (impaired relaxation).   3. Global right ventricle has normal systolic function.The right  ventricular size is normal. Right vetricular wall thickness was not  assessed.   4. Left atrial size was normal.   5. Right atrial size was normal.   6. The mitral valve is normal in structure. No evidence of mitral valve  regurgitation.   7. The tricuspid valve is normal in structure. Tricuspid valve  regurgitation is not demonstrated.   8. The aortic valve was not well visualized. Aortic valve regurgitation  is mild.   9. The pulmonic valve was not assessed. Pulmonic valve regurgitation not  assessed. __________   Intraoperative TEE 08/26/2018:  Left Atrium: Normal size, no evidence of LAA thrombus with PWD   velocities > 40 mm/s in LAA   Septum: No Patent Foramen Ovale present, no evidence of PFO or ASD on  color flow doppler   Left Ventricle: Normal chamber size and reduced systolic function, LVEF  45-50%, hypokinesis of the anteroseptal and inferoseptal wall.   Aortic valve: Tricuspid valve noted, valve leaflets demonstrate normal  motion with minimal sclerosis or calcification. Mild regurgitation.  Aorta: The aortic root and ascending aorta are mildly dilated.   Mitral valve: Trace regurgitation with a central jet.   Right Atrium: Normal size, PA catheter noted traversing RA   Right ventricle: Normal cavity size, wall thickness and ejection  fraction.   Pulmonic Valve: No insufficiency seen on color doppler.   Aorta: Grade 2 calcification of descending aorta and aortic arch, no  evidence of dissection.   Pericardium: no significant pericardial effusion. __________   Pre-CABG carotid and peripheral arterial ultrasound 08/23/2018: Summary:  Right Carotid: Velocities in the right ICA are consistent with a 1-39%  stenosis.   Left Carotid: Velocities in the left ICA are consistent with a 1-39%  stenosis.  Vertebrals: Bilateral vertebral arteries demonstrate antegrade flow.   Right ABI: Resting right ankle-brachial index is within normal range. No  evidence of significant right lower extremity arterial disease.  Left ABI: Resting left ankle-brachial index is within normal range. No  evidence of significant left lower extremity arterial disease.  __________   2D echo 08/22/2018: - Left ventricle: Systolic function was mildly to moderately    reduced. The estimated ejection fraction was in the range of 40%    to 45%. Moderate hypokinesis of the mid-apicalanteroseptal and    apical myocardium. Features are consistent with a pseudonormal    left ventricular filling pattern, with concomitant abnormal    relaxation and increased filling pressure (grade 2 diastolic     dysfunction).  - Aortic valve: There was mild regurgitation. __________   LHC 08/21/2018: Conclusions: Severe three-vessel coronary artery disease, including 50% distal LMCA, 80% ostial/proximal LAD, and 90% ostial LCx, and 99% mid RCA stenoses. Moderately to severely reduced left ventricular systolic function (LVEF 30-35%) with global hypokinesis and mid/apical akinesis. Moderately elevated left ventricular filling pressure.   Recommendations: Transfer to [facility] for surgical consultation for CABG. Gentle diuresis. Restart heparin  infusion 2 hours after TR band deflation. Aggressive secondary prevention and medical therapy.  Will continue carvedilol  3.125 mg BID and increase atorvastatin  to 80 mg daily.   Recommend uninterrupted dual antiplatelet therapy with aspirin  81mg  daily and clopidogrel  75mg  daily for a minimum of 12 months (ACS - Class I recommendation).  I will defer starting clopidogrel  pending cardiac surgery consultation.   EKG:  EKG is ordered today.  The EKG ordered today demonstrates NSR, 68 bpm, low voltage QRS, prior inferior infarct nonspecific lateral ST-T changes more pronounced when compared to prior tracing  Recent Labs: 12/28/2023: ALT 13 09/12/2024: BUN 23; Creatinine, Ser 0.98; Hemoglobin 13.0; Platelets 238; Potassium 4.9; Sodium 142  Recent Lipid Panel    Component Value Date/Time   CHOL 138 12/28/2023 0826   TRIG 234 (H) 12/28/2023 0826   HDL 40 12/28/2023 0826   CHOLHDL 3.5 12/28/2023 0826   CHOLHDL 3.8 06/15/2022 1533   VLDL 48 (H) 06/15/2022 1533   LDLCALC 60 12/28/2023 0826   LDLCALC  07/04/2017 0902     Comment:     . LDL cholesterol not calculated. Triglyceride levels greater than 400 mg/dL invalidate calculated LDL results. . Reference range: <100 . Desirable range <100 mg/dL for primary prevention;   <70 mg/dL for patients with CHD or diabetic patients  with > or = 2 CHD risk factors. SABRA LDL-C is now calculated using the  Martin-Hopkins  calculation, which is a validated novel method providing  better accuracy than the Friedewald equation in the  estimation of LDL-C.  Gladis APPLETHWAITE et al. SANDREA. 7986;689(80): 2061-2068  (http://education.[employer].com/faq/FAQ164)    LDLDIRECT  72 06/15/2022 1533    PHYSICAL EXAM:    VS:  BP (!) 150/70 (BP Location: Left Arm, Patient Position: Sitting, Cuff Size: Normal)   Pulse 68 Comment: 72 oximeter  Ht 5' 1 (1.549 m)   Wt 161 lb 3.2 oz (73.1 kg)   SpO2 97%   BMI 30.46 kg/m   BMI: Body mass index is 30.46 kg/m.  Physical Exam Constitutional:      Appearance: She is well-developed.  HENT:     Head: Normocephalic and atraumatic.  Eyes:     General:        Right eye: No discharge.        Left eye: No discharge.  Cardiovascular:     Rate and Rhythm: Normal rate and regular rhythm.     Heart sounds: Normal heart sounds, S1 normal and S2 normal. Heart sounds not distant. No midsystolic click and no opening snap. No murmur heard.    No friction rub.  Pulmonary:     Effort: Pulmonary effort is normal. No respiratory distress.     Breath sounds: Normal breath sounds. No decreased breath sounds, wheezing, rhonchi or rales.  Musculoskeletal:     Cervical back: Normal range of motion.     Right lower leg: No edema.     Left lower leg: No edema.  Skin:    General: Skin is warm and dry.     Nails: There is no clubbing.  Neurological:     Mental Status: She is alert and oriented to person, place, and time.  Psychiatric:        Speech: Speech normal.        Behavior: Behavior normal.        Thought Content: Thought content normal.        Judgment: Judgment normal.     Wt Readings from Last 3 Encounters:  10/17/24 161 lb 3.2 oz (73.1 kg)  09/19/24 162 lb 9.6 oz (73.8 kg)  09/12/24 163 lb 9.6 oz (74.2 kg)     ASSESSMENT & PLAN:   CAD status post CABG with stable angina: Recent cardiac cath demonstrated severe three-vessel native CAD with interval  chronic occlusion of the distal RCA, otherwise anatomy appeared similar with widely patent LIMA to LAD.  Recommendation was to escalate antianginal therapy and if she continued to have anginal symptoms to consider PCI from the distal left main into the LCx.  Reports resolution of exertional dyspnea following addition of Imdur , which will be continued at 30 mg.  Given resolution of symptoms, we will defer intervention involving the left main into LCx at this time.  Continue aspirin  81 mg, atorvastatin  80 mg, and Lopressor  25 mg twice daily.  Close monitoring for recurrence of angina with low threshold to escalate antianginal therapy and/or pursue further intervention.  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  Remains on Lopressor  25 mg twice daily.  Not requiring a standing loop diuretic.  With lack of heart failure symptoms and with normalization of LV systolic function, defer further escalation of GDMT at this time.  Mitral regurgitation/aortic insufficiency: Echo in 09/2024 demonstrated mild to moderate mitral regurgitation and moderate aortic insufficiency.  Monitor with periodic echo.  HTN: Blood pressure mildly elevated in the office today, though typically well-controlled.  Continue Imdur  30 mg and Lopressor  25 mg twice daily.  HLD: LDL 60 in 12/2023 with normal AST/ALT at that time.  Target LDL less than 55.  Add ezetimibe  10 mg with continuation of atorvastatin  80 mg.  Recheck fasting lipid and liver function in 2 months.  Carotid artery stenosis: Ultrasound in 09/2024 demonstrated 1 to 39% right ICA stenosis with no evidence of stenosis in left ICA with antegrade flow of the bilateral vertebral arteries and normal flow hemodynamics of the bilateral subclavian arteries.  Remains on statin and lipid-lowering therapy as outlined above.     Disposition: F/u with Dr. Mady or an APP in 1 month.   Medication Adjustments/Labs and Tests Ordered: Current medicines are reviewed at  length with the patient today.  Concerns regarding medicines are outlined above. Medication changes, Labs and Tests ordered today are summarized above and listed in the Patient Instructions accessible in Encounters.   Signed, Bernardino Bring, PA-C 10/17/2024 9:57 AM     [facility] [place] [address] [place], [place] [address] [phone]     [1]  Current Meds  Medication Sig   acetaminophen  (TYLENOL ) 500 MG tablet Take 2 tablets (1,000 mg total) by mouth every 6 (six) hours as needed.   albuterol  (VENTOLIN  HFA) 108 (90 Base) MCG/ACT inhaler Inhale 2 puffs into the lungs every 6 (six) hours as needed for wheezing or shortness of breath.   aspirin  EC 81 MG tablet Take 1 tablet (81 mg total) by mouth daily.   atorvastatin  (LIPITOR ) 80 MG tablet TAKE 1 TABLET DAILY (Patient taking differently: Take 80 mg by mouth daily. Before 6pm)   blood glucose meter kit and supplies KIT Dispense based on patient and insurance preference. two times daily as directed. For E11.65   desonide (DESOWEN) 0.05 % cream Apply 1 Application topically 2 (two) times daily. (Patient taking differently: Apply 1 Application topically as needed.)   ezetimibe  (ZETIA ) 10 MG tablet Take 1 tablet (10 mg total) by mouth daily.   fluticasone  (FLONASE ) 50 MCG/ACT nasal spray Place 2 sprays into both nostrils daily. Use for 4-6 weeks then stop and use seasonally or as needed. (Patient taking differently: Place 2 sprays into both nostrils as needed for allergies. Use for 4-6 weeks then stop and use seasonally or as needed.)   glucose blood test strip Use as instructed   Lancets (ONETOUCH ULTRASOFT) lancets TEST TWICE DAILY   metFORMIN  (GLUCOPHAGE ) 500 MG tablet TAKE 2 TABLETS IN THE MORNING AND 1 TABLET IN THE EVENING WITH MEALS   metoprolol  tartrate (LOPRESSOR ) 25 MG tablet TAKE 1 TABLET TWICE A DAY   nitroGLYCERIN  (NITROSTAT ) 0.4 MG SL tablet Place 1 tablet (0.4 mg total) under the tongue every 5 (five)  minutes as needed for chest pain.   [DISCONTINUED] isosorbide  mononitrate (IMDUR ) 30 MG 24 hr tablet Take 1 tablet (30 mg total) by mouth daily.   "

## 2024-10-17 ENCOUNTER — Ambulatory Visit: Attending: Physician Assistant | Admitting: Physician Assistant

## 2024-10-17 ENCOUNTER — Encounter: Payer: Self-pay | Admitting: Physician Assistant

## 2024-10-17 VITALS — BP 150/70 | HR 68 | Ht 61.0 in | Wt 161.2 lb

## 2024-10-17 DIAGNOSIS — I25118 Atherosclerotic heart disease of native coronary artery with other forms of angina pectoris: Secondary | ICD-10-CM | POA: Diagnosis not present

## 2024-10-17 DIAGNOSIS — I5021 Acute systolic (congestive) heart failure: Secondary | ICD-10-CM

## 2024-10-17 DIAGNOSIS — I340 Nonrheumatic mitral (valve) insufficiency: Secondary | ICD-10-CM

## 2024-10-17 DIAGNOSIS — I351 Nonrheumatic aortic (valve) insufficiency: Secondary | ICD-10-CM | POA: Diagnosis not present

## 2024-10-17 DIAGNOSIS — I5020 Unspecified systolic (congestive) heart failure: Secondary | ICD-10-CM

## 2024-10-17 DIAGNOSIS — I6521 Occlusion and stenosis of right carotid artery: Secondary | ICD-10-CM | POA: Diagnosis not present

## 2024-10-17 DIAGNOSIS — E785 Hyperlipidemia, unspecified: Secondary | ICD-10-CM | POA: Diagnosis not present

## 2024-10-17 DIAGNOSIS — I255 Ischemic cardiomyopathy: Secondary | ICD-10-CM | POA: Diagnosis not present

## 2024-10-17 DIAGNOSIS — I10 Essential (primary) hypertension: Secondary | ICD-10-CM

## 2024-10-17 MED ORDER — ISOSORBIDE MONONITRATE ER 30 MG PO TB24: 30.0000 mg | ORAL_TABLET | Freq: Every day | ORAL | 0 refills | Status: DC

## 2024-10-17 MED ORDER — EZETIMIBE 10 MG PO TABS: 10.0000 mg | ORAL_TABLET | Freq: Every day | ORAL | 3 refills | Status: AC

## 2024-10-17 MED ORDER — ISOSORBIDE MONONITRATE ER 30 MG PO TB24: 30.0000 mg | ORAL_TABLET | Freq: Every day | ORAL | 3 refills | Status: DC

## 2024-10-17 MED ORDER — ISOSORBIDE MONONITRATE ER 30 MG PO TB24: 30.0000 mg | ORAL_TABLET | Freq: Every day | ORAL | 0 refills | Status: AC

## 2024-10-17 NOTE — Patient Instructions (Signed)
 Medication Instructions:  Your physician recommends the following medication changes.  START TAKING: Zetia  10 mg daily  *If you need a refill on your cardiac medications before your next appointment, please call your pharmacy*  Lab Work: None ordered at this time   Follow-Up: At [facility], you and your health needs are our priority.  As part of our continuing mission to provide you with exceptional heart care, our providers are all part of one team.  This team includes your primary Cardiologist (physician) and Advanced Practice Providers or APPs (Physician Assistants and Nurse Practitioners) who all work together to provide you with the care you need, when you need it.  Your next appointment:   1 month(s)  Provider:   You may see Lonni Hanson, MD or Bernardino Bring, PA-C  We recommend signing up for the patient portal called MyChart.  Sign up information is provided on this After Visit Summary.  MyChart is used to connect with patients for Virtual Visits (Telemedicine).  Patients are able to view lab/test results, encounter notes, upcoming appointments, etc.  Non-urgent messages can be sent to your provider as well.   To learn more about what you can do with MyChart, go to [email or web address].

## 2024-10-21 ENCOUNTER — Ambulatory Visit: Admitting: Physician Assistant

## 2024-11-20 ENCOUNTER — Ambulatory Visit: Admitting: Physician Assistant
